# Patient Record
Sex: Male | Born: 1960
Health system: Southern US, Community
[De-identification: ages and names within clinical notes are randomized; demographics above are authoritative.]

## PROBLEM LIST (undated history)

## (undated) DIAGNOSIS — I255 Ischemic cardiomyopathy: Secondary | ICD-10-CM

## (undated) DIAGNOSIS — K651 Peritoneal abscess: Secondary | ICD-10-CM

## (undated) DIAGNOSIS — D649 Anemia, unspecified: Secondary | ICD-10-CM

## (undated) DIAGNOSIS — I5022 Chronic systolic (congestive) heart failure: Secondary | ICD-10-CM

## (undated) DIAGNOSIS — Z85038 Personal history of other malignant neoplasm of large intestine: Secondary | ICD-10-CM

## (undated) DIAGNOSIS — I10 Essential (primary) hypertension: Secondary | ICD-10-CM

## (undated) DIAGNOSIS — C186 Malignant neoplasm of descending colon: Secondary | ICD-10-CM

## (undated) DIAGNOSIS — K6389 Other specified diseases of intestine: Secondary | ICD-10-CM

## (undated) DIAGNOSIS — Z9889 Other specified postprocedural states: Secondary | ICD-10-CM

## (undated) DIAGNOSIS — F419 Anxiety disorder, unspecified: Secondary | ICD-10-CM

## (undated) DIAGNOSIS — I219 Acute myocardial infarction, unspecified: Secondary | ICD-10-CM

## (undated) DIAGNOSIS — D500 Iron deficiency anemia secondary to blood loss (chronic): Secondary | ICD-10-CM

## (undated) DIAGNOSIS — Z95810 Presence of automatic (implantable) cardiac defibrillator: Secondary | ICD-10-CM

## (undated) DIAGNOSIS — D696 Thrombocytopenia, unspecified: Secondary | ICD-10-CM

## (undated) DIAGNOSIS — K56600 Partial intestinal obstruction, unspecified as to cause: Secondary | ICD-10-CM

## (undated) DIAGNOSIS — Z598 Other problems related to housing and economic circumstances: Secondary | ICD-10-CM

## (undated) DIAGNOSIS — I2510 Atherosclerotic heart disease of native coronary artery without angina pectoris: Secondary | ICD-10-CM

## (undated) DIAGNOSIS — J449 Chronic obstructive pulmonary disease, unspecified: Secondary | ICD-10-CM

## (undated) DIAGNOSIS — N189 Chronic kidney disease, unspecified: Secondary | ICD-10-CM

## (undated) DIAGNOSIS — Z7901 Long term (current) use of anticoagulants: Secondary | ICD-10-CM

## (undated) DIAGNOSIS — C801 Malignant (primary) neoplasm, unspecified: Secondary | ICD-10-CM

## (undated) DIAGNOSIS — I252 Old myocardial infarction: Secondary | ICD-10-CM

## (undated) DIAGNOSIS — D120 Benign neoplasm of cecum: Secondary | ICD-10-CM

## (undated) DIAGNOSIS — Z720 Tobacco use: Secondary | ICD-10-CM

## (undated) DIAGNOSIS — F411 Generalized anxiety disorder: Secondary | ICD-10-CM

## (undated) DIAGNOSIS — E785 Hyperlipidemia, unspecified: Secondary | ICD-10-CM

## (undated) HISTORY — DX: Presence of automatic (implantable) cardiac defibrillator: Z95.810

## (undated) HISTORY — DX: Other specified postprocedural states: Z98.890

## (undated) HISTORY — DX: Peritoneal abscess: K65.1

## (undated) HISTORY — PX: COLONOSCOPY: SHX174

## (undated) HISTORY — DX: Chronic kidney disease, unspecified: N18.9

## (undated) HISTORY — DX: Partial intestinal obstruction, unspecified as to cause: K56.600

## (undated) HISTORY — DX: Essential (primary) hypertension: I10

## (undated) HISTORY — DX: Ischemic cardiomyopathy: I25.5

## (undated) HISTORY — DX: Chronic obstructive pulmonary disease, unspecified: J44.9

## (undated) HISTORY — DX: Other problems related to housing and economic circumstances: Z59.8

## (undated) HISTORY — DX: Benign neoplasm of cecum: D12.0

## (undated) HISTORY — DX: Generalized anxiety disorder: F41.1

## (undated) HISTORY — DX: Iron deficiency anemia secondary to blood loss (chronic): D50.0

## (undated) HISTORY — DX: Hyperlipidemia, unspecified: E78.5

## (undated) HISTORY — DX: Old myocardial infarction: I25.2

## (undated) HISTORY — DX: Personal history of other malignant neoplasm of large intestine: Z85.038

## (undated) HISTORY — DX: Tobacco use: Z72.0

## (undated) HISTORY — DX: Long term (current) use of anticoagulants: Z79.01

## (undated) HISTORY — DX: Malignant neoplasm of descending colon: C18.6

## (undated) HISTORY — PX: HAND SURGERY: SHX662

---

## 2005-06-15 ENCOUNTER — Encounter: Admission: RE | Admit: 2005-06-15 | Discharge: 2005-06-15 | Payer: Self-pay | Admitting: Neurology

## 2011-12-01 ENCOUNTER — Emergency Department (HOSPITAL_COMMUNITY)
Admission: EM | Admit: 2011-12-01 | Discharge: 2011-12-01 | Disposition: A | Discharge status: Home / Self Care | Payer: Self-pay | Attending: Emergency Medicine | Admitting: Emergency Medicine

## 2011-12-01 ENCOUNTER — Encounter (HOSPITAL_COMMUNITY): Payer: Self-pay | Admitting: *Deleted

## 2011-12-01 ENCOUNTER — Emergency Department (HOSPITAL_COMMUNITY): Payer: Self-pay

## 2011-12-01 DIAGNOSIS — Y93E5 Activity, floor mopping and cleaning: Secondary | ICD-10-CM | POA: Insufficient documentation

## 2011-12-01 DIAGNOSIS — S46909A Unspecified injury of unspecified muscle, fascia and tendon at shoulder and upper arm level, unspecified arm, initial encounter: Secondary | ICD-10-CM | POA: Insufficient documentation

## 2011-12-01 DIAGNOSIS — S0993XA Unspecified injury of face, initial encounter: Secondary | ICD-10-CM | POA: Insufficient documentation

## 2011-12-01 DIAGNOSIS — Y9289 Other specified places as the place of occurrence of the external cause: Secondary | ICD-10-CM | POA: Insufficient documentation

## 2011-12-01 DIAGNOSIS — F17200 Nicotine dependence, unspecified, uncomplicated: Secondary | ICD-10-CM | POA: Insufficient documentation

## 2011-12-01 DIAGNOSIS — W1789XA Other fall from one level to another, initial encounter: Secondary | ICD-10-CM | POA: Insufficient documentation

## 2011-12-01 DIAGNOSIS — IMO0002 Reserved for concepts with insufficient information to code with codable children: Secondary | ICD-10-CM

## 2011-12-01 DIAGNOSIS — S4980XA Other specified injuries of shoulder and upper arm, unspecified arm, initial encounter: Secondary | ICD-10-CM | POA: Insufficient documentation

## 2011-12-01 DIAGNOSIS — S4990XA Unspecified injury of shoulder and upper arm, unspecified arm, initial encounter: Secondary | ICD-10-CM

## 2011-12-01 DIAGNOSIS — S51009A Unspecified open wound of unspecified elbow, initial encounter: Secondary | ICD-10-CM | POA: Insufficient documentation

## 2011-12-01 MED ORDER — HYDROCODONE-ACETAMINOPHEN 5-325 MG PO TABS: 2.0000 | ORAL_TABLET | ORAL | Status: DC | PRN

## 2011-12-01 NOTE — ED Provider Notes (Signed)
History  Scribed for Nelia Shi, MD, patient was seen in room TR04C/TR04C. This chart was scribed by Candelaria Stagers. The patient's care started at 3:20 PM   CSN: [id number]  Arrival date & time 12/01/11  1446   None     Chief Complaint  Patient presents with  . Fall  . Laceration     Patient is a 51 y.o. male presenting with skin laceration. The history is provided by the patient. No language interpreter was used.  Laceration    Christopher [name] is a 51 y.o. male who presents to the Emergency Department complaining of lacerations to the left elbow after falling backwards off a roof while cleaning the gutters.  He is also experiencing pain to the left shoulder and left side of the neck.  External rotation at the shoulder makes the pain worse.  Pt has h/o neck pain.   History reviewed. No pertinent past medical history.  History reviewed. No pertinent past surgical history.  No family history on file.  History  Substance Use Topics  . Smoking status: Current Every Day Smoker  . Smokeless tobacco: Not on file  . Alcohol Use: Yes     occ      Review of Systems  All other systems reviewed and are negative.    Allergies  Celebrex  Home Medications   Current Outpatient Rx  Name Route Sig Dispense Refill  . ASPIRIN PO Oral Take 1 tablet by mouth as needed. For pain    . HYDROCODONE-ACETAMINOPHEN 5-325 MG PO TABS Oral Take 2 tablets by mouth every 4 (four) hours as needed for pain. 10 tablet 0    BP 145/98  Pulse 95  Temp 98.5 F (36.9 C) (Oral)  Resp 18  SpO2 96%  Physical Exam  Nursing note and vitals reviewed. Constitutional: He is oriented to person, place, and time. He appears well-developed and well-nourished. No distress.  HENT:  Head: Normocephalic and atraumatic.  Eyes: Pupils are equal, round, and reactive to light.  Neck: Normal range of motion. No spinous process tenderness and no muscular tenderness present.  Cardiovascular: Normal rate and  intact distal pulses.   Pulmonary/Chest: No respiratory distress.  Abdominal: Normal appearance. He exhibits no distension.  Musculoskeletal:       Left shoulder: He exhibits decreased range of motion and tenderness.       Thoracic back: He exhibits no tenderness and no bony tenderness.       Lumbar back: He exhibits no tenderness, no bony tenderness and no pain.       Arms:      Lacerations measuring 5cm and 1 cm  Neurological: He is alert and oriented to person, place, and time. No cranial nerve deficit.  Skin: Skin is warm and dry. No rash noted.  Psychiatric: He has a normal mood and affect. His behavior is normal.    ED Course  Procedures  DIAGNOSTIC STUDIES:  COORDINATION OF CARE:   14:55 Ordered: DG Shoulder Left  3:57 PM LACERATION REPAIR Performed by: Nelia Shi, MD Consent: Verbal consent obtained. Risks and benefits: risks, benefits and alternatives were discussed Patient identity confirmed: provided demographic data Time out performed prior to procedure Prepped and Draped in normal sterile fashion Wound explored Laceration Location: L elbow Laceration Length: 6cm No Foreign Bodies seen or palpated Anesthesia: local infiltration Local anesthetic: lidocaine 2% w epinephrine Anesthetic total: 10 ml Irrigation method: syringe Amount of cleaning: standard Skin closure:  Number of sutures or staples:  8 Technique: staples Patient tolerance: Patient tolerated the procedure well with no immediate complications.  Labs Reviewed - No data to display Dg Shoulder Left  12/01/2011  *RADIOLOGY REPORT*  Clinical Data: Larey Seat.  Left shoulder pain.  LEFT SHOULDER - 2+ VIEW  Comparison: None  Findings: The joint spaces are maintained.  No acute fracture or abnormal soft tissue calcifications.  The left lung is clear and the upper left ribs are intact.  IMPRESSION: No fracture or dislocation.   Original Report Authenticated By: Rudie Meyer, M.D.      1. Laceration   2.  Shoulder injury       MDM  I personally performed the services described in this documentation, which was scribed in my presence. The recorded information has been reviewed and considered.       Nelia Shi, MD 12/01/11 2256

## 2011-12-01 NOTE — ED Notes (Signed)
Pt arrived via [facility] and ambulatory after falling off a roof about 10 feet up and landed on his back onto a wood deck.  Pt is shakey.  Pt last etoh was last nite, but denies drinking everyday.  No loc and pt c/o left shoulder pain and left elbow abrasion and with 4 in and 2 inc lac to distal elbow.  Bleeding controlled

## 2011-12-08 ENCOUNTER — Emergency Department (HOSPITAL_COMMUNITY)
Admission: EM | Admit: 2011-12-08 | Discharge: 2011-12-08 | Disposition: A | Discharge status: Home / Self Care | Payer: Self-pay | Attending: Emergency Medicine | Admitting: Emergency Medicine

## 2011-12-08 ENCOUNTER — Encounter (HOSPITAL_COMMUNITY): Payer: Self-pay | Admitting: *Deleted

## 2011-12-08 DIAGNOSIS — Z4802 Encounter for removal of sutures: Secondary | ICD-10-CM | POA: Insufficient documentation

## 2011-12-08 DIAGNOSIS — F17200 Nicotine dependence, unspecified, uncomplicated: Secondary | ICD-10-CM | POA: Insufficient documentation

## 2011-12-08 MED ORDER — FLUORESCEIN SODIUM 1 MG OP STRP
ORAL_STRIP | OPHTHALMIC | Status: AC
  Filled 2011-12-08: qty 1

## 2011-12-08 NOTE — ED Provider Notes (Signed)
History    This chart was scribed for Christopher Sprout, MD, MD by Smitty Pluck, ED Scribe. The patient was seen in room TR11C and the patient's care was started at 11:28AM.   CSN: [id number]  Arrival date & time 12/08/11  1050      Chief Complaint  Patient presents with  . Suture / Staple Removal    (Consider location/radiation/quality/duration/timing/severity/associated sxs/prior treatment) Patient is a 51 y.o. male presenting with suture removal. The history is provided by the patient. No language interpreter was used.  Suture / Staple Removal    Christopher [name] is a 51 y.o. male who presents to the Emergency Department due to staple removal from left forearm. Staples were placed 1 week ago. Pt reports that he fell off of roof causing laceration. He denies any complications with laceration. Pt denies any other pain currently.      History reviewed. No pertinent past medical history.  History reviewed. No pertinent past surgical history.  No family history on file.  History  Substance Use Topics  . Smoking status: Current Every Day Smoker  . Smokeless tobacco: Not on file  . Alcohol Use: Yes     Comment: occ      Review of Systems  Constitutional: Negative for fever and chills.  Respiratory: Negative for shortness of breath.   Gastrointestinal: Negative for nausea and vomiting.  Neurological: Negative for weakness.  All other systems reviewed and are negative.    Allergies  Celebrex  Home Medications   Current Outpatient Rx  Name  Route  Sig  Dispense  Refill  . ASPIRIN EC 325 MG PO TBEC   Oral   Take 325 mg by mouth daily.           BP 140/106  Pulse 101  Temp 97.9 F (36.6 C) (Oral)  Resp 18  Ht 5\' 9"  (1.753 m)  Wt 150 lb (68.04 kg)  BMI 22.15 kg/m2  SpO2 96%  Physical Exam  Nursing note and vitals reviewed. Constitutional: He is oriented to person, place, and time. He appears well-developed and well-nourished. No distress.  HENT:    Head: Normocephalic and atraumatic.  Eyes: EOM are normal.  Neck: Neck supple. No tracheal deviation present.  Pulmonary/Chest: Effort normal. No respiratory distress.  Musculoskeletal: Normal range of motion.  Neurological: He is alert and oriented to person, place, and time.  Skin: Skin is warm and dry.       9 staples were removed Well healed wound Mild redness No drainage  Psychiatric: He has a normal mood and affect. His behavior is normal.    ED Course  Procedures (including critical care time) DIAGNOSTIC STUDIES: Oxygen Saturation is 96% on room air, normal by my interpretation.    COORDINATION OF CARE: 11:41 AM Discussed ED treatment with pt     Labs Reviewed - No data to display No results found.   1. Encounter for staple removal       MDM   Patient here for staple removal. Wound is healing appropriately. Staples removed Steri-Strips placed and patient discharged home.      I personally performed the services described in this documentation, which was scribed in my presence.  The recorded information has been reviewed and considered.    Christopher Sprout, MD 12/08/11 1145

## 2011-12-08 NOTE — ED Notes (Signed)
Patient is here for suture removal,  Placed last week to the left elbow.  Patient denies any obvious s/sx of infection

## 2013-04-29 DIAGNOSIS — I219 Acute myocardial infarction, unspecified: Secondary | ICD-10-CM

## 2013-04-29 HISTORY — DX: Acute myocardial infarction, unspecified: I21.9

## 2013-05-29 HISTORY — PX: CARDIAC CATHETERIZATION: SHX172

## 2013-05-29 HISTORY — PX: CORONARY ANGIOPLASTY: SHX604

## 2013-06-20 ENCOUNTER — Emergency Department (HOSPITAL_COMMUNITY): Payer: No Typology Code available for payment source

## 2013-06-20 ENCOUNTER — Encounter (HOSPITAL_COMMUNITY)
Admission: EM | Disposition: A | Discharge status: Home / Self Care | Payer: No Typology Code available for payment source | Source: Home / Self Care | Attending: Cardiovascular Disease

## 2013-06-20 ENCOUNTER — Inpatient Hospital Stay (HOSPITAL_COMMUNITY)
Admission: EM | Admit: 2013-06-20 | Discharge: 2013-06-25 | DRG: 237 | Disposition: A | Discharge status: Home / Self Care | Payer: No Typology Code available for payment source | Attending: Cardiovascular Disease | Admitting: Cardiovascular Disease

## 2013-06-20 ENCOUNTER — Encounter (HOSPITAL_COMMUNITY): Payer: Self-pay | Admitting: Emergency Medicine

## 2013-06-20 DIAGNOSIS — F17200 Nicotine dependence, unspecified, uncomplicated: Secondary | ICD-10-CM | POA: Diagnosis present

## 2013-06-20 DIAGNOSIS — I5022 Chronic systolic (congestive) heart failure: Secondary | ICD-10-CM | POA: Diagnosis not present

## 2013-06-20 DIAGNOSIS — I2102 ST elevation (STEMI) myocardial infarction involving left anterior descending coronary artery: Secondary | ICD-10-CM | POA: Diagnosis present

## 2013-06-20 DIAGNOSIS — I2589 Other forms of chronic ischemic heart disease: Secondary | ICD-10-CM | POA: Diagnosis present

## 2013-06-20 DIAGNOSIS — I2582 Chronic total occlusion of coronary artery: Secondary | ICD-10-CM | POA: Diagnosis present

## 2013-06-20 DIAGNOSIS — J449 Chronic obstructive pulmonary disease, unspecified: Secondary | ICD-10-CM | POA: Diagnosis present

## 2013-06-20 DIAGNOSIS — I252 Old myocardial infarction: Secondary | ICD-10-CM

## 2013-06-20 DIAGNOSIS — I2510 Atherosclerotic heart disease of native coronary artery without angina pectoris: Secondary | ICD-10-CM | POA: Diagnosis present

## 2013-06-20 DIAGNOSIS — I5023 Acute on chronic systolic (congestive) heart failure: Secondary | ICD-10-CM | POA: Diagnosis present

## 2013-06-20 DIAGNOSIS — I214 Non-ST elevation (NSTEMI) myocardial infarction: Secondary | ICD-10-CM

## 2013-06-20 DIAGNOSIS — I2109 ST elevation (STEMI) myocardial infarction involving other coronary artery of anterior wall: Principal | ICD-10-CM | POA: Diagnosis present

## 2013-06-20 DIAGNOSIS — F17210 Nicotine dependence, cigarettes, uncomplicated: Secondary | ICD-10-CM | POA: Diagnosis present

## 2013-06-20 DIAGNOSIS — E785 Hyperlipidemia, unspecified: Secondary | ICD-10-CM | POA: Diagnosis present

## 2013-06-20 DIAGNOSIS — I959 Hypotension, unspecified: Secondary | ICD-10-CM | POA: Diagnosis present

## 2013-06-20 DIAGNOSIS — Z720 Tobacco use: Secondary | ICD-10-CM | POA: Diagnosis present

## 2013-06-20 DIAGNOSIS — Z955 Presence of coronary angioplasty implant and graft: Secondary | ICD-10-CM

## 2013-06-20 DIAGNOSIS — Z886 Allergy status to analgesic agent status: Secondary | ICD-10-CM

## 2013-06-20 DIAGNOSIS — I509 Heart failure, unspecified: Secondary | ICD-10-CM | POA: Diagnosis present

## 2013-06-20 DIAGNOSIS — J4489 Other specified chronic obstructive pulmonary disease: Secondary | ICD-10-CM | POA: Diagnosis present

## 2013-06-20 HISTORY — PX: INTRA-AORTIC BALLOON PUMP INSERTION: SHX5475

## 2013-06-20 HISTORY — PX: PERCUTANEOUS CORONARY STENT INTERVENTION (PCI-S): SHX5485

## 2013-06-20 HISTORY — PX: LEFT HEART CATHETERIZATION WITH CORONARY ANGIOGRAM: SHX5451

## 2013-06-20 HISTORY — DX: Old myocardial infarction: I25.2

## 2013-06-20 LAB — BASIC METABOLIC PANEL
BUN: 13 mg/dL (ref 6–23)
CALCIUM: 10.3 mg/dL (ref 8.4–10.5)
CHLORIDE: 99 meq/L (ref 96–112)
CO2: 23 meq/L (ref 19–32)
Creatinine, Ser: 1.13 mg/dL (ref 0.50–1.35)
GFR calc Af Amer: 86 mL/min — ABNORMAL LOW (ref >90)
GFR, EST NON AFRICAN AMERICAN: 74 mL/min — AB (ref >90)
Glucose, Bld: 121 mg/dL — ABNORMAL HIGH (ref 70–99)
POTASSIUM: 4.5 meq/L (ref 3.7–5.3)
Sodium: 138 mEq/L (ref 137–147)

## 2013-06-20 LAB — CBC
HCT: 45.4 % (ref 39.0–52.0)
HEMOGLOBIN: 15.9 g/dL (ref 13.0–17.0)
MCH: 32.3 pg (ref 26.0–34.0)
MCHC: 35 g/dL (ref 30.0–36.0)
MCV: 92.1 fL (ref 78.0–100.0)
Platelets: 152 10*3/uL (ref 150–400)
RBC: 4.93 MIL/uL (ref 4.22–5.81)
RDW: 14.1 % (ref 11.5–15.5)
WBC: 17 10*3/uL — AB (ref 4.0–10.5)

## 2013-06-20 LAB — MRSA PCR SCREENING: MRSA BY PCR: NEGATIVE

## 2013-06-20 LAB — CK TOTAL AND CKMB (NOT AT ARMC)
CK, MB: >300 ng/mL (ref 0.3–4.0)
Total CK: 6036 U/L — ABNORMAL HIGH (ref 7–232)

## 2013-06-20 LAB — I-STAT TROPONIN, ED: TROPONIN I, POC: 20.24 ng/mL — AB (ref 0.00–0.08)

## 2013-06-20 SURGERY — LEFT HEART CATHETERIZATION WITH CORONARY ANGIOGRAM
Anesthesia: LOCAL

## 2013-06-20 MED ORDER — TICAGRELOR 90 MG PO TABS
ORAL_TABLET | ORAL | Status: AC
  Filled 2013-06-20: qty 2

## 2013-06-20 MED ORDER — ASPIRIN 81 MG PO CHEW
81.0000 mg | CHEWABLE_TABLET | Freq: Every day | ORAL | Status: DC
  Administered 2013-06-21 – 2013-06-25 (×5): 81 mg via ORAL
  Filled 2013-06-20 (×5): qty 1

## 2013-06-20 MED ORDER — SODIUM CHLORIDE 0.9 % IV SOLN: INTRAVENOUS | Status: AC

## 2013-06-20 MED ORDER — LIDOCAINE HCL (PF) 1 % IJ SOLN
INTRAMUSCULAR | Status: AC
  Filled 2013-06-20: qty 30

## 2013-06-20 MED ORDER — MORPHINE SULFATE 4 MG/ML IJ SOLN
4.0000 mg | INTRAMUSCULAR | Status: DC | PRN
  Administered 2013-06-20: 4 mg via INTRAVENOUS
  Filled 2013-06-20: qty 1

## 2013-06-20 MED ORDER — HEPARIN BOLUS VIA INFUSION
4000.0000 [IU] | Freq: Once | INTRAVENOUS | Status: DC
  Filled 2013-06-20: qty 4000

## 2013-06-20 MED ORDER — CARVEDILOL 3.125 MG PO TABS
3.1250 mg | ORAL_TABLET | Freq: Two times a day (BID) | ORAL | Status: DC
  Administered 2013-06-21 (×2): 3.125 mg via ORAL
  Filled 2013-06-20 (×10): qty 1

## 2013-06-20 MED ORDER — HEPARIN SODIUM (PORCINE) 5000 UNIT/ML IJ SOLN
4000.0000 [IU] | INTRAMUSCULAR | Status: AC
  Administered 2013-06-20: 4000 [IU] via SUBCUTANEOUS
  Filled 2013-06-20: qty 0.8

## 2013-06-20 MED ORDER — LISINOPRIL 2.5 MG PO TABS
2.5000 mg | ORAL_TABLET | Freq: Every day | ORAL | Status: DC
Start: 2013-06-21 — End: 2013-06-24
  Administered 2013-06-21 – 2013-06-23 (×2): 2.5 mg via ORAL
  Filled 2013-06-20 (×4): qty 1

## 2013-06-20 MED ORDER — FENTANYL CITRATE 0.05 MG/ML IJ SOLN
INTRAMUSCULAR | Status: AC
  Filled 2013-06-20: qty 2

## 2013-06-20 MED ORDER — TICAGRELOR 90 MG PO TABS
90.0000 mg | ORAL_TABLET | Freq: Two times a day (BID) | ORAL | Status: DC
  Administered 2013-06-20 – 2013-06-25 (×10): 90 mg via ORAL
  Filled 2013-06-20 (×11): qty 1

## 2013-06-20 MED ORDER — ONDANSETRON HCL 4 MG/2ML IJ SOLN
4.0000 mg | Freq: Once | INTRAMUSCULAR | Status: AC
  Administered 2013-06-20: 4 mg via INTRAVENOUS
  Filled 2013-06-20: qty 2

## 2013-06-20 MED ORDER — HEPARIN (PORCINE) IN NACL 2-0.9 UNIT/ML-% IJ SOLN
INTRAMUSCULAR | Status: AC
  Filled 2013-06-20: qty 1500

## 2013-06-20 MED ORDER — BIVALIRUDIN 250 MG IV SOLR
INTRAVENOUS | Status: AC
Start: 2013-06-20 — End: 2013-06-20
  Filled 2013-06-20: qty 250

## 2013-06-20 MED ORDER — NITROGLYCERIN 0.2 MG/ML ON CALL CATH LAB
INTRAVENOUS | Status: AC
  Filled 2013-06-20: qty 1

## 2013-06-20 MED ORDER — HEPARIN (PORCINE) IN NACL 100-0.45 UNIT/ML-% IJ SOLN
900.0000 [IU]/h | INTRAMUSCULAR | Status: DC
  Administered 2013-06-20: 750 [IU]/h via INTRAVENOUS
  Filled 2013-06-20: qty 250

## 2013-06-20 MED ORDER — MIDAZOLAM HCL 2 MG/2ML IJ SOLN
INTRAMUSCULAR | Status: AC
  Filled 2013-06-20: qty 2

## 2013-06-20 MED ORDER — NITROGLYCERIN IN D5W 200-5 MCG/ML-% IV SOLN
5.0000 ug/min | INTRAVENOUS | Status: DC
  Administered 2013-06-20: 5 ug/min via INTRAVENOUS
  Filled 2013-06-20: qty 250

## 2013-06-20 MED ORDER — HEPARIN SODIUM (PORCINE) 1000 UNIT/ML IJ SOLN
INTRAMUSCULAR | Status: AC
  Filled 2013-06-20: qty 1

## 2013-06-20 MED ORDER — SODIUM CHLORIDE 0.9 % IV BOLUS (SEPSIS)
1000.0000 mL | Freq: Once | INTRAVENOUS | Status: AC
  Administered 2013-06-20: 1000 mL via INTRAVENOUS

## 2013-06-20 MED ORDER — VERAPAMIL HCL 2.5 MG/ML IV SOLN
INTRAVENOUS | Status: AC
  Filled 2013-06-20: qty 2

## 2013-06-20 NOTE — ED Notes (Signed)
md at bedside

## 2013-06-20 NOTE — ED Provider Notes (Signed)
CSN: [id number]     Arrival date & time 06/20/13  1327 History   First MD Initiated Contact with Patient 06/20/13 1358     Chief Complaint  Patient presents with  . Chest Pain     (Consider location/radiation/quality/duration/timing/severity/associated sxs/prior Treatment) HPI  Christopher [name] is a 53 y.o. male who presents for evaluation of left upper chest pain, that started at 4 PM yesterday. The pain has been constant. It is currently 9/10. This is the worst that the pain has been. There is no associated diaphoresis, nausea, vomiting, cough, or shortness of breath. He saw his chiropractor, yesterday morning, for a adjustment of his upper back. His upper back and neck do not hurt, now. He recalls similar chest pain, when he had a fall, 2 years ago. He has been receiving chiropractic treatment for this fall. He has not had any known cardiac or pulmonary disease. He does not take any medications, regularly.    Past Medical History  Diagnosis Date  . Fall    No past surgical history on file. No family history on file. History  Substance Use Topics  . Smoking status: Current Every Day Smoker  . Smokeless tobacco: Not on file  . Alcohol Use: Yes     Comment: occ    Review of Systems  All other systems reviewed and are negative.     Allergies  Celebrex  Home Medications   Prior to Admission medications   Medication Sig Start Date End Date Taking? Authorizing Provider  aspirin EC 325 MG tablet Take 325 mg by mouth every morning.    Yes Historical Provider, MD   BP 125/93  Pulse 113  Temp(Src) 97.6 F (36.4 C) (Oral)  Resp 16  SpO2 98% Physical Exam  Nursing note and vitals reviewed. Constitutional: He is oriented to person, place, and time. He appears well-developed and well-nourished.  HENT:  Head: Normocephalic and atraumatic.  Right Ear: External ear normal.  Left Ear: External ear normal.  Eyes: Conjunctivae and EOM are normal. Pupils are equal, round, and  reactive to light.  Neck: Normal range of motion and phonation normal. Neck supple.  Cardiovascular: Normal rate, regular rhythm, normal heart sounds and intact distal pulses.   Pulmonary/Chest: Effort normal and breath sounds normal. No respiratory distress. He exhibits no tenderness and no bony tenderness.  Abdominal: Soft. There is no tenderness.  Musculoskeletal: Normal range of motion.  No tenderness over the cervical, thoracic spines; or paravertebral musculature tenderness.  Neurological: He is alert and oriented to person, place, and time. No cranial nerve deficit or sensory deficit. He exhibits normal muscle tone. Coordination normal.  Skin: Skin is warm, dry and intact.  Psychiatric: He has a normal mood and affect. His behavior is normal. Judgment and thought content normal.    ED Course  Procedures (including critical care time)  Medications  morphine 4 MG/ML injection 4 mg (4 mg Intravenous Given 06/20/13 1425)  nitroGLYCERIN 0.2 mg/mL in dextrose 5 % infusion (5 mcg/min Intravenous New Bag/Given 06/20/13 1429)  ondansetron (ZOFRAN) injection 4 mg (4 mg Intravenous Given 06/20/13 1425)  heparin injection 4,000 Units (4,000 Units Subcutaneous Given 06/20/13 1423)    Patient Vitals for the past 24 hrs:  BP Temp Temp src Pulse Resp SpO2  06/20/13 1415 125/93 mmHg - - 113 - 98 %  06/20/13 1400 124/93 mmHg - - 112 16 97 %  06/20/13 1355 141/103 mmHg - - 108 16 99 %  06/20/13 1350 - - -  118 21 95 %  06/20/13 1339 132/102 mmHg 97.6 F (36.4 C) Oral 112 14 98 %    2: 18 PM Reevaluation with update and discussion. After initial assessment and treatment, an updated evaluation reveals no change in status. Richarda Blade    14:20- I discussed the case, and reviewed the EKG with Dr. Wynonia Lawman. He, states that this is an anterior MI, of undetermined onset, he would like the patient to be a code STEMI. Pt. has had continuous CP since 4 PM yesterday.  STEMI is activated.  CRITICAL  CARE Performed by: Richarda Blade Total critical care time: 50 minutes Critical care time was exclusive of separately billable procedures and treating other patients. Critical care was necessary to treat or prevent imminent or life-threatening deterioration. Critical care was time spent personally by me on the following activities: development of treatment plan with patient and/or surrogate as well as nursing, discussions with consultants, evaluation of patient's response to treatment, examination of patient, obtaining history from patient or surrogate, ordering and performing treatments and interventions, ordering and review of laboratory studies, ordering and review of radiographic studies, pulse oximetry and re-evaluation of patient's condition.  Labs Review Labs Reviewed  CBC - Abnormal; Notable for the following:    WBC 17.0 (*)    All other components within normal limits  BASIC METABOLIC PANEL - Abnormal; Notable for the following:    Glucose, Bld 121 (*)    GFR calc non Af Amer 74 (*)    GFR calc Af Amer 86 (*)    All other components within normal limits  I-STAT TROPOININ, ED - Abnormal; Notable for the following:    Troponin i, poc 20.24 (*)    All other components within normal limits    Imaging Review Dg Chest Port 1 View  06/20/2013   CLINICAL DATA:  53 year old male with chest pain and shortness of breath  EXAM: PORTABLE CHEST - 1 VIEW  COMPARISON:  None.  FINDINGS: The cardiomediastinal silhouette is unremarkable.  Interstitial prominence is present.  There is no evidence of focal airspace disease, suspicious pulmonary nodule/mass, pleural effusion, or pneumothorax. No acute bony abnormalities are identified.  IMPRESSION: Mild interstitial opacities of uncertain chronicity but more likely chronic. Mild interstitial infection/edema is considered less likely.   Electronically Signed   By: Hassan Rowan M.D.   On: 06/20/2013 14:33     EKG Interpretation   Date/Time:  Saturday  Jun 20 2013 13:44:23 EDT Ventricular Rate:  108 PR Interval:  174 QRS Duration: 90 QT Interval:  331 QTC Calculation: 444 R Axis:   92 Text Interpretation:  Sinus tachycardia Probable left atrial enlargement  Inferolateral infarct, age indeterminate Probable anteroseptal infarct,  recent No old tracing to compare Confirmed by [facility]  MD, [place] [phone])  on 06/20/2013 2:33:40 PM      MDM   Final diagnoses:  NSTEMI (non-ST elevated myocardial infarction)     Chest pain with elevated troponin, and abnormal EKG. STEMI was called d/t persistent CP. The initial EKG did not have criteria for STEMI activation (viewed at 1347).  Pt. Being transferred to Cath Lab at [facility], MD 06/22/13 2025

## 2013-06-20 NOTE — ED Notes (Signed)
He c/o central area chest pain; although he originally phoned EMS for back pain.  He further tells me he had a fall of ~12-14 feet in Nov. Of 2013 for which he has seeked occasional treatments at a chiropractor.  He states he has had this chest pain that radiates through to upper back ever since a chiropractic treatment two days ago. He is in no distress, although he is somewhat hyperactive in his behavior.

## 2013-06-20 NOTE — ED Notes (Signed)
md made aware of lab results

## 2013-06-20 NOTE — ED Notes (Signed)
[facility] alerted, pt being transported to cath lab via ems

## 2013-06-20 NOTE — ED Notes (Signed)
[facility] unavailable, ems called

## 2013-06-20 NOTE — Progress Notes (Signed)
ANTICOAGULATION CONSULT NOTE - Initial Consult  Pharmacy Consult for Heparin Indication: IABP  Allergies  Allergen Reactions  . Celebrex [Celecoxib] Other (See Comments)    Caused his back to burn    Patient Measurements: Height: 5\' 8"  (172.7 cm) Weight: 147 lb 14.9 oz (67.1 kg) IBW/kg (Calculated) : 68.4 Heparin Dosing Weight: 67.1 kg  Vital Signs: Temp: 98.5 F (36.9 C) (05/23 1800) Temp src: Oral (05/23 1800) BP: 132/98 mmHg (05/23 1452) Pulse Rate: 113 (05/23 1510)  Labs:  Recent Labs  06/20/13 1350  HGB 15.9  HCT 45.4  PLT 152  CREATININE 1.13    Estimated Creatinine Clearance: 74.2 ml/min (by C-G formula based on Cr of 1.13).   Medical History: History reviewed. No pertinent past medical history.  Medications:  Prescriptions prior to admission  Medication Sig Dispense Refill  . aspirin EC 325 MG tablet Take 325 mg by mouth every morning.         Assessment: CP 53 y/o M smoker with no known PMH presents with ongoing chest pain. STEMI.  Cath 5/23: PTCA +stento to mid-LAD. Chronic total occlusion of the right coronary and moderately severe diffuse circumflex disease. EF 15-20%. IABP x 24-48h.   Goal of Therapy:  Heparin level 0.2-0.5 Monitor platelets by anticoagulation protocol: Yes   Plan:  Start IV heparin with no bolus at 750 units/hr Will check heparin level in 6 hrs and daily.  Rojean Ige S. Alford Highland, PharmD, [facility] Clinical Staff Pharmacist Pager [phone]  Wayland Salinas 06/20/2013,6:48 PM

## 2013-06-20 NOTE — ED Notes (Signed)
Pt c/o constant CP x2 days. Pt reports that it feels the same as when he fell off a roof several years ago. Pt denies SOB, nausea, emesis. Pt is A&O and in NAD. Pt adds that pain started after a chiropractic adjustment.

## 2013-06-20 NOTE — ED Notes (Signed)
Cardiology at bedside in trauma C

## 2013-06-20 NOTE — ED Provider Notes (Signed)
Patient seen on arrival. Code STEMI activated from [facility]. Dr.Tilley here awaiting patient upon his arrival. He continues to complain of chest pain. EKG shows evolving anterior changes. Patient is taken directly to Cath Lab after history and physical obtained. Patient given aspirin 324 by EMS. Given heparin bolus at [facility]. On nitro drip now. Hemodynamically stable. Awake and alert.  Tanna Furry, MD 06/20/13 1459

## 2013-06-20 NOTE — CV Procedure (Signed)
Left Heart Catheterization with Coronary Angiography and PCI Report  Christopher [name]  53 y.o.  male [birth year]  Procedure Date: 06/20/2013 Referring Physician: Grace Bushy. Wynonia Lawman, M.D. Primary Cardiologist: Grace Bushy. Wynonia Lawman, M.D.  INDICATIONS: Ongoing chest pain with acute anterior ST elevation compatible with late presenting as elevation MI  PROCEDURE: 1. Left heart catheterization; 2 para coronary angiography; 3. Left ventriculography; 4. PCI with DES LAD; 5. Intra-aortic balloon pump  CONSENT:  The risks, benefits, and details of the procedure were explained in detail to the patient. Risks including death, stroke, heart attack, kidney injury, allergy, limb ischemia, bleeding and radiation injury were discussed.  The patient verbalized understanding and wanted to proceed.  Informed written consent was obtained.  PROCEDURE TECHNIQUE:  After Xylocaine anesthesia a 5 French Slender sheath was placed in the right radial artery with an angiocath and the modified Seldinger technique.  Coronary angiography was done using a 5 F JR 4 and JL 3.5 cm diagnostic catheters.  Left ventriculography was done using the JR 4 catheter and hand injection. Initial angiography demonstrated a complex chronic coronary disease with a highly tortuous right coronary totally occluded after the first acute marginal branch in the mid vessel. Highly diseased left coronary system with proximal to mid LAD total occlusion after a small first diagonal. Collaterals were noted left to right. Circumflex contained ectatic moderately severe atherosclerosis. 4000 units of heparin was administered in the emergency room prior to arrival in the cath lab.  With ongoing chest pain we decided to proceed with angioplasty of the acute vessel, the LAD. The angiographic marker of acute occlusion was contrast staining thrombus at the site of total occlusion. Heavy calcification was noted in this region. Left ventriculography demonstrated a  markedly dilated left ventricular cavity, inferior wall and a large region of mid to anterior wall akinesis the ejection fraction was estimated to be 15-20%.  Bivalirudin bolus followed by infusion to a therapeutic ACT was accomplished. Brilinta 180 mg loading dose was given orally.  An XB LAD 3.5 cm guide catheter was then used from the right radial to obtain guiding shots. We used a Pro-water 0.014 guidewire in the diagonal and a BMW 0.014 wire was used to attempt LAD cannulization. We had difficulty crossing the LAD lesion. After persistence we were eventually successful, however difficulty with wiring prolonged reperfusion time. We then performed angioplasty over the BMW wire with a 2.0 x 12 mm balloon in multiple spots throughout the proximal to distal LAD. We then use a 2.5 x 15 mm balloon to again perform multiple overlapping balloon inflations throughout the proximal to distal LAD. We then positioned and deployed a 28 x 3.0 mm Xience Alpine stent to 10 atmospheres. Postdilatation was performed with a 15 mm long by 3.5 millimeter Hutchinson Trek to 12 atmospheres x2 inflations. A distal stent margin region of disruption was noted post stent deployment. An additional 3.0 x 8 mm Xience Alpine DES was deployed overlapping with the initial stent to 12 atmospheres. This adequately covered the entire treatment area and less than 0% stenosis was noted. Multiple aliquots of intracoronary nitroglycerin were administered. The distal vessel was somewhat small and the appearance of chronic occlusion and vessel atresia.  Because of continued chest discomfort after successful PCI of the LAD, we placed an intra-aortic balloon pump in the right femoral using the modified Seldinger technique and fluoroscopic guidance. The indication was acute on chronic LV heart failure with the hope that reduction in LV wall tension  and improvement and diastolic myocardial perfusion with improved symptoms and help manage incipient heart  failure.  Post procedure the right radial sheath was removed and a wrist band was applied with good hemostasis using 11 cc of water.   CONTRAST:  Total of 296 cc.  COMPLICATIONS:  None   HEMODYNAMICS:  Aortic pressure 110/87 mmHg; LV pressure 111/27 mmHg; LVEDP 32 mmHg  ANGIOGRAPHIC DATA:   The left main coronary artery is widely patent but short.  The left anterior descending artery is totally occluded after the first septal perforator and diagonal.  The left circumflex artery is patent with moderate disease noted including ectasia in the mid vessel. There is 50% stenosis of the second obtuse marginal which is the largest of 3..  The right coronary artery is totally occluded in the mid vessel after a severe tortuosity noted in the proximal segment. The first acute marginal branch supplies collaterals to the PDA. The PDA and distal left ventricular branches are also supply by left to right collaterals from the proximal LAD and the circumflex.Marland Kitchen   PCI RESULTS: The LAD is 100% occluded in the proximal midsegment and reduced to 0% after angioplasty and stenting of a chronically diseased mid segment finishing with 32 mm of overlapping DE stent postdilated to 3.5 mm in diameter. TIMI grade 3 flow was noted to  LEFT VENTRICULOGRAM:  Left ventricular angiogram was done in the 30 RAO projection and revealed dilated LV cavity with inferior wall akinesis apical akinesis/dyskinesis and mid and distal anterior wall akinesis. Estimated ejection fraction is 15-20%.   IMPRESSIONS:  1. Acute ST elevation microinfarction, anterior. 2. Successful PTCA and stenting of the proximal to mid LAD with DES from 100% to 0% final result. Time to reperfusion was prolonged due to difficulty crossing the stenosis in the LAD which had obvious severe chronic calcified disease. 3. Chronic total occlusion of the right coronary and moderately severe diffuse circumflex disease. 4. Severe left ventricular dysfunction as  described above with LVEF 15-20% 5. Continued chest pain post procedure with a respiratory/pleuritic component potentially compatible with post MI pericarditis playing a role in the patient's ongoing chest pain over the last 24 hours 6. Successful implantation of intra-aortic balloon pump to unload the LV, and improved diastolic myocardial perfusion  RECOMMENDATION:   1. Aspirin and Brilinta 2. IV heparin 3. Low-dose ACE and beta blocker therapy 4. Will need heart failure team evaluation and management 5. Intra-aortic balloon counterpulsation x 24-48 hours before discontinuation

## 2013-06-20 NOTE — ED Notes (Signed)
Cath team sts ready and pt transported upstairs.

## 2013-06-20 NOTE — ED Notes (Signed)
Troponin given to Dr. Eulis Foster

## 2013-06-20 NOTE — H&P (Addendum)
History and Physical   Admit date: 06/20/2013 Name:  Christopher [name] Medical record number: [id number] DOB/Age:  1960/02/14  53 y.o. male  Referring Physician:   [facility]  Primary Physician:   None  Chief complaint/reason for admission: Chest pain  HPI:  This 53 year old male presented to the emergency room with severe chest pain. The patient has a history of smoking but has unremarkable past history. He does manual labor usually doing heavy lifting. He was in his usual state of health until April when he began to have exertional left-sided chest discomfort when pushing heavy objects. He sought the attention of a chiropractor and has been receiving treatment since then. He was able to work on Thursday night. Yesterday afternoon he had the onset of left-sided chest pain and it persisted and was severe overnight he was unable to sleep. He was brought by EMS to the emergency room at [facility] long today and his troponin was greater than 20. An EKG showed an anterolateral infarct of undetermined age with residual ST elevation. He continues to complain of chest pain around 7/10 now but was earlier 9/10. The EMS noted some fluctuating ST elevations.  He really doesn't have any prior history of hypertension and does not have a regular doctor. He is no history of hyperlipidemia. As noted above he does smoke.   History reviewed. No pertinent past medical history.    No past surgical history on file..  Allergies: is allergic to celebrex.   Medications: Prior to Admission medications   Medication Sig Start Date End Date Taking? Authorizing Provider  aspirin EC 325 MG tablet Take 325 mg by mouth every morning.    Yes Historical Provider, MD    Family History:  Family Status  Relation Status Death Age  . Father Deceased 5's  . Mother Deceased 32's  . Brother Alive   . Brother Alive     Social History:   reports that he has been smoking Cigarettes.  He has a 60 pack-year  smoking history. He has never used smokeless tobacco. He reports that he drinks alcohol.   History   Social History Narrative  He reports that a male roommate died of infarction recently.   Review of Systems:  Other than as noted above, the remainder of the review of systems is normal  Physical Exam: BP 132/98  Pulse 119  Temp(Src) 98.3 F (36.8 C) (Oral)  Resp 21  SpO2 97% General appearance: Anxious appearing white male complaining of chest pain Head: Normocephalic, without obvious abnormality, atraumatic Eyes: conjunctivae/corneas clear. PERRL, EOM's intact. Fundi not examined Neck: no adenopathy, no carotid bruit, no JVD and supple, symmetrical, trachea midline Lungs: clear to auscultation bilaterally Heart: regular rate and rhythm, S1, S2 normal, no murmur, click, rub or gallop Abdomen: soft, non-tender; bowel sounds normal; no masses,  no organomegaly Rectal: deferred Extremities: extremities normal, atraumatic, no cyanosis or edema Pulses: 2+ and symmetric Skin: Skin color, texture, turgor normal. No rashes or lesions Neurologic: Grossly normal   Labs: CBC  Recent Labs  06/20/13 1350  WBC 17.0*  RBC 4.93  HGB 15.9  HCT 45.4  PLT 152  MCV 92.1  MCH 32.3  MCHC 35.0  RDW 14.1   CMP   Recent Labs  06/20/13 1350  NA 138  K 4.5  CL 99  CO2 23  GLUCOSE 121*  BUN 13  CREATININE 1.13  CALCIUM 10.3  GFRNONAA 74*  GFRAA 86*   EKG: Inferior and anterior infarct  undetermined age with residual ST elevation in the anterolateral leads  Radiology: Unremarkable cardiomediastinal silhouette, chronic interstitial markings   IMPRESSIONS: 1. Anterolateral and inferior infarction of undetermined age possible recent or acute-with elevation of troponin suspect that the duration of infarct is greater than 12 hours 2. Tobacco abuse 3. Elevation of glucose 4. Elevation of blood pressure  PLAN: Patient will be taken to the cardiac catheterization laboratory for  acute intervention a lot of ongoing chest discomfort. It is suspected that his infarction is subacute the in light of unrelieved pain he will be taken to the catheterization laboratory for further evaluation of his coronary arteries. This was discussed with the patient fully and he is agreeable. Dr. Tamala Julian will be doing the case.  Signed: Kerry Hough MD [facility]  06/20/2013, 3:04 PM

## 2013-06-21 DIAGNOSIS — I2109 ST elevation (STEMI) myocardial infarction involving other coronary artery of anterior wall: Secondary | ICD-10-CM

## 2013-06-21 DIAGNOSIS — I379 Nonrheumatic pulmonary valve disorder, unspecified: Secondary | ICD-10-CM

## 2013-06-21 DIAGNOSIS — F17210 Nicotine dependence, cigarettes, uncomplicated: Secondary | ICD-10-CM | POA: Diagnosis present

## 2013-06-21 LAB — BASIC METABOLIC PANEL
BUN: 13 mg/dL (ref 6–23)
CALCIUM: 8.9 mg/dL (ref 8.4–10.5)
CHLORIDE: 103 meq/L (ref 96–112)
CO2: 25 mEq/L (ref 19–32)
Creatinine, Ser: 1.14 mg/dL (ref 0.50–1.35)
GFR calc non Af Amer: 73 mL/min — ABNORMAL LOW (ref >90)
GFR, EST AFRICAN AMERICAN: 85 mL/min — AB (ref >90)
Glucose, Bld: 111 mg/dL — ABNORMAL HIGH (ref 70–99)
Potassium: 4.4 mEq/L (ref 3.7–5.3)
Sodium: 139 mEq/L (ref 137–147)

## 2013-06-21 LAB — CBC
HCT: 39.1 % (ref 39.0–52.0)
Hemoglobin: 13.1 g/dL (ref 13.0–17.0)
MCH: 31.6 pg (ref 26.0–34.0)
MCHC: 33.5 g/dL (ref 30.0–36.0)
MCV: 94.4 fL (ref 78.0–100.0)
Platelets: DECREASED 10*3/uL (ref 150–400)
RBC: 4.14 MIL/uL — ABNORMAL LOW (ref 4.22–5.81)
RDW: 14.7 % (ref 11.5–15.5)
WBC: 10.8 10*3/uL — ABNORMAL HIGH (ref 4.0–10.5)

## 2013-06-21 LAB — PLATELET COUNT: Platelets: 109 10*3/uL — ABNORMAL LOW (ref 150–400)

## 2013-06-21 LAB — HEPARIN LEVEL (UNFRACTIONATED): Heparin Unfractionated: <0.1 IU/mL — ABNORMAL LOW (ref 0.30–0.70)

## 2013-06-21 LAB — POCT ACTIVATED CLOTTING TIME: ACTIVATED CLOTTING TIME: 127 s

## 2013-06-21 MED ORDER — SPIRONOLACTONE 12.5 MG HALF TABLET
12.5000 mg | ORAL_TABLET | Freq: Every day | ORAL | Status: DC
  Administered 2013-06-21: 12.5 mg via ORAL
  Filled 2013-06-21 (×2): qty 1

## 2013-06-21 MED ORDER — ENOXAPARIN SODIUM 40 MG/0.4ML ~~LOC~~ SOLN
40.0000 mg | SUBCUTANEOUS | Status: DC
  Administered 2013-06-21 – 2013-06-24 (×4): 40 mg via SUBCUTANEOUS
  Filled 2013-06-21 (×5): qty 0.4

## 2013-06-21 MED ORDER — ATROPINE SULFATE 0.1 MG/ML IJ SOLN
INTRAMUSCULAR | Status: AC
  Filled 2013-06-21: qty 10

## 2013-06-21 MED ORDER — NICOTINE 21 MG/24HR TD PT24
21.0000 mg | MEDICATED_PATCH | Freq: Every day | TRANSDERMAL | Status: DC
  Administered 2013-06-22 – 2013-06-25 (×4): 21 mg via TRANSDERMAL
  Filled 2013-06-21 (×5): qty 1

## 2013-06-21 MED ORDER — DIGOXIN 125 MCG PO TABS
0.1250 mg | ORAL_TABLET | Freq: Every day | ORAL | Status: DC
  Administered 2013-06-21 – 2013-06-25 (×5): 0.125 mg via ORAL
  Filled 2013-06-21 (×5): qty 1

## 2013-06-21 NOTE — Progress Notes (Signed)
ANTICOAGULATION CONSULT NOTE - Follow Up  Pharmacy Consult for Heparin Indication: IABP  Allergies  Allergen Reactions  . Celebrex [Celecoxib] Other (See Comments)    Caused his back to burn   Patient Measurements: Height: 5\' 8"  (172.7 cm) Weight: 147 lb 14.9 oz (67.1 kg) IBW/kg (Calculated) : 68.4 Heparin Dosing Weight: 67.1 kg  Vital Signs: Temp: 98.1 F (36.7 C) (05/24 0400) Temp src: Oral (05/24 0400) BP: 104/51 mmHg (05/24 0400) Pulse Rate: 81 (05/24 0400)  Labs:  Recent Labs  06/20/13 1350 06/20/13 2015 06/21/13 0249  HGB 15.9  --  13.1  HCT 45.4  --  39.1  PLT 152  --  PLATELET CLUMPS NOTED ON SMEAR, COUNT APPEARS DECREASED  HEPARINUNFRC  --   --  <0.10*  CREATININE 1.13  --  1.14  CKTOTAL  --  6036*  --   CKMB  --  >300.0*  --    Estimated Creatinine Clearance: 73.6 ml/min (by C-G formula based on Cr of 1.14).  Medical History: History reviewed. No pertinent past medical history.  Medications:  Prescriptions prior to admission  Medication Sig Dispense Refill  . aspirin EC 325 MG tablet Take 325 mg by mouth every morning.        Assessment: CP 53 y/o M smoker with no known PMH presents with ongoing chest pain. STEMI.  Cath 5/23: PTCA +stento to mid-LAD. Chronic total occlusion of the right coronary and moderately severe diffuse circumflex disease. EF 15-20%. IABP x 24-48h.  5/24:  Initial Heparin level < 0.1 on IV rate of 750 units/hr.  The lab was collected at 02:49 and resulted at 04:56AM.  I spoke with his nurse who reports no noted bleeding and no IV line issues.  Goal of Therapy:  Heparin level 0.2-0.5 Monitor platelets by anticoagulation protocol: Yes   Plan:  Will increase rate of IV heparin to 900 units/hr Will check heparin level in 8 hrs and daily.  Rober Minion, PharmD., MS Clinical Pharmacist Pager:  [phone] Thank you for allowing pharmacy to be part of this patients care team. 06/21/2013,5:03 AM

## 2013-06-21 NOTE — Progress Notes (Signed)
SUBJECTIVE:  53 yr old man past medical history of heavy smoking, admitted 5/23 with late-presenting STEMI and ongoing CP underwent emergent cath with DES in LAD and IABP placement on 5/22. Chronic total occlusion of the RCA and 50% diffuse circumflex disease. Left ventriculogram with EF 15-20%.LVEDP 32  IABP is still in place. MAPS 65s. No further CP. Breathing ok   . aspirin  81 mg Oral Daily  . carvedilol  3.125 mg Oral BID WC  . lisinopril  2.5 mg Oral Daily  . ticagrelor  90 mg Oral BID   . heparin 750 Units/hr (06/20/13 1959)  . nitroGLYCERIN 5 mcg/min (06/20/13 2145)    PHYSICAL EXAM Filed Vitals:   06/21/13 0500 06/21/13 0600 06/21/13 0700 06/21/13 0725  BP: 104/59 101/56 112/47 112/47  Pulse: 82 83  86  Temp:    97.7 F (36.5 C)  TempSrc:    Oral  Resp: 24 20  22   Height:      Weight:      SpO2: 98% 99%  98%   General appearance: Lying bed having breakfast.   Head: Normocephalic, without obvious abnormality, atraumatic  Eyes: conjunctivae/corneas clear. PERRL, EOM's intact.  Neck: no adenopathy, no carotid bruit,JVP 7 and supple, symmetrical, trachea midline  Lungs: clear to auscultation bilaterally  Heart: distant HS  regular rate and rhythm, no obvious murmur or gallop Abdomen: soft, non-tender; bowel sounds normal; no masses, no organomegaly  Rectal: deferred  Extremities: extremities normal, atraumatic, no cyanosis or edema. Right groin access site without complications. IABP in place Pulses: 2+ and symmetric  Skin: Skin color, texture, turgor normal. No rashes or lesions  Neurologic: Grossly normal  LABS: No results found for this basename: TROPONINI   Results for orders placed during the hospital encounter of 06/20/13 (from the past 24 hour(s))  CBC     Status: Abnormal   Collection Time    06/20/13  1:50 PM      Result Value Ref Range   WBC 17.0 (*) 4.0 - 10.5 K/uL   RBC 4.93  4.22 - 5.81 MIL/uL   Hemoglobin 15.9  13.0 - 17.0 g/dL   HCT 45.4   39.0 - 52.0 %   MCV 92.1  78.0 - 100.0 fL   MCH 32.3  26.0 - 34.0 pg   MCHC 35.0  30.0 - 36.0 g/dL   RDW 14.1  11.5 - 15.5 %   Platelets 152  150 - 400 K/uL  BASIC METABOLIC PANEL     Status: Abnormal   Collection Time    06/20/13  1:50 PM      Result Value Ref Range   Sodium 138  137 - 147 mEq/L   Potassium 4.5  3.7 - 5.3 mEq/L   Chloride 99  96 - 112 mEq/L   CO2 23  19 - 32 mEq/L   Glucose, Bld 121 (*) 70 - 99 mg/dL   BUN 13  6 - 23 mg/dL   Creatinine, Ser 1.13  0.50 - 1.35 mg/dL   Calcium 10.3  8.4 - 10.5 mg/dL   GFR calc non Af Amer 74 (*) >90 mL/min   GFR calc Af Amer 86 (*) >90 mL/min  I-STAT TROPOININ, ED     Status: Abnormal   Collection Time    06/20/13  1:56 PM      Result Value Ref Range   Troponin i, poc 20.24 (*) 0.00 - 0.08 ng/mL   Comment 3  MRSA PCR SCREENING     Status: None   Collection Time    06/20/13  6:00 PM      Result Value Ref Range   MRSA by PCR NEGATIVE  NEGATIVE  CK TOTAL AND CKMB     Status: Abnormal   Collection Time    06/20/13  8:15 PM      Result Value Ref Range   Total CK 6036 (*) 7 - 232 U/L   CK, MB >300.0 (*) 0.3 - 4.0 ng/mL   Relative Index NOT CALCULATED  0.0 - 2.5  BASIC METABOLIC PANEL     Status: Abnormal   Collection Time    06/21/13  2:49 AM      Result Value Ref Range   Sodium 139  137 - 147 mEq/L   Potassium 4.4  3.7 - 5.3 mEq/L   Chloride 103  96 - 112 mEq/L   CO2 25  19 - 32 mEq/L   Glucose, Bld 111 (*) 70 - 99 mg/dL   BUN 13  6 - 23 mg/dL   Creatinine, Ser 1.14  0.50 - 1.35 mg/dL   Calcium 8.9  8.4 - 10.5 mg/dL   GFR calc non Af Amer 73 (*) >90 mL/min   GFR calc Af Amer 85 (*) >90 mL/min  CBC     Status: Abnormal   Collection Time    06/21/13  2:49 AM      Result Value Ref Range   WBC 10.8 (*) 4.0 - 10.5 K/uL   RBC 4.14 (*) 4.22 - 5.81 MIL/uL   Hemoglobin 13.1  13.0 - 17.0 g/dL   HCT 39.1  39.0 - 52.0 %   MCV 94.4  78.0 - 100.0 fL   MCH 31.6  26.0 - 34.0 pg   MCHC 33.5  30.0 - 36.0 g/dL   RDW 14.7   11.5 - 15.5 %   Platelets    150 - 400 K/uL   Value: PLATELET CLUMPS NOTED ON SMEAR, COUNT APPEARS DECREASED  HEPARIN LEVEL (UNFRACTIONATED)     Status: Abnormal   Collection Time    06/21/13  2:49 AM      Result Value Ref Range   Heparin Unfractionated <0.10 (*) 0.30 - 0.70 IU/mL    Intake/Output Summary (Last 24 hours) at 06/21/13 0817 Last data filed at 06/21/13 0600  Gross per 24 hour  Intake 1532.04 ml  Output   1175 ml  Net 357.04 ml    EKG:  Anteroseptal STE with qwaves and inferior TWI. In NSR  ASSESSMENT AND PLAN:  Principal Problem:   ST elevation myocardial infarction (STEMI) of anterior wall Active Problems:   Acute on chronic systolic heart failure, NYHA class 4   ST elevation (STEMI) myocardial infarction involving left anterior descending coronary artery   Heavy smoker (more than 20 cigarettes per day)   Acute STEMI: post cath 5/23. DES in LAD and IABP placement on 5/22. Chronic total occlusion of the RCA and moderately severe diffuse circumflex disease. Trop >20. Plan  - cont with ASA and Brilinta, and Lipitor - cont with NGT gtt for chest pain - cont with heparin gtt for IABP in place - counseled about smoking cessation - cardiac rehab  Systolic CHF: EF on L ventriculogram 15-20%. IABP in place  Plan  - coreg 3.125 mg bid  - Lisinopril 2.5 mg daily  - Echocardiogram today - IABP for 24-48 hrs.  Smoking: Smokes 2 PPD. Encouraged to quit.  Can start NRT with patches.  Discussed with Dr Haroldine Laws   Signed:  Jessee Avers, MD PGY-2 Internal Medicine Teaching Service Pager: [phone] 06/21/2013, 8:45 AM   Patient seen and examined with Dr. Alice Rieger. We discussed all aspects of the encounter. I agree with the assessment and plan as stated above.   53 y/o male with COPD with late presenting large anterior MI and severe LV dysfunction. No further CP. Stable on IABP. We weaned to 1:2 and watched him and hemodynamics remained stable. Will plan d/c  of IABP. Suspect he will have extensive myocardial damage and may struggle with significant HF in the near future. Once IABP out can support with milrinone as needed. Counseled on need to stop smoking. Start spiro and digoxin. Add lasix as needed.   The patient is critically ill with multiple organ systems failure and requires high complexity decision making for assessment and support, frequent evaluation and titration of therapies, application of advanced monitoring technologies and extensive interpretation of multiple databases.   Critical Care Time personally devoted to patient care services described in this note is 35 Minutes.   Shaune Pascal Kwabena Strutz,MD 12:15 PM

## 2013-06-21 NOTE — Progress Notes (Signed)
Femoral venous sheath removed per protocol by Gigi; site soft c NAB or hematoma; pressure dressing applied; will continue to monitor

## 2013-06-21 NOTE — Progress Notes (Signed)
CRITICAL CARE Performed by: Asencion Gowda   Total critical care time: Ebony Hail Horton,R.N. @ 6:48am  Critical care time was exclusive of separately billable procedures and treating other patients.  Critical care was necessary to treat or prevent imminent or life-threatening deterioration.  Critical care was time spent personally by me on the following activities: development of treatment plan with patient and/or surrogate as well as nursing, discussions with consultants, evaluation of patient's response to treatment, examination of patient, obtaining history from patient or surrogate, ordering and performing treatments and interventions, ordering and review of laboratory studies, ordering and review of radiographic studies, pulse oximetry and re-evaluation of patient's condition.

## 2013-06-21 NOTE — Progress Notes (Signed)
  Echocardiogram 2D Echocardiogram has been performed.  Basilia Jumbo 06/21/2013, 3:53 PM

## 2013-06-22 DIAGNOSIS — F17200 Nicotine dependence, unspecified, uncomplicated: Secondary | ICD-10-CM

## 2013-06-22 DIAGNOSIS — I214 Non-ST elevation (NSTEMI) myocardial infarction: Secondary | ICD-10-CM

## 2013-06-22 LAB — LIPID PANEL
CHOL/HDL RATIO: 3.7 ratio
CHOLESTEROL: 151 mg/dL (ref 0–200)
HDL: 41 mg/dL (ref >39)
LDL Cholesterol: 85 mg/dL (ref 0–99)
Triglycerides: 126 mg/dL (ref <150)
VLDL: 25 mg/dL (ref 0–40)

## 2013-06-22 LAB — CBC
HCT: 41.3 % (ref 39.0–52.0)
Hemoglobin: 14 g/dL (ref 13.0–17.0)
MCH: 31.7 pg (ref 26.0–34.0)
MCHC: 33.9 g/dL (ref 30.0–36.0)
MCV: 93.7 fL (ref 78.0–100.0)
PLATELETS: 108 10*3/uL — AB (ref 150–400)
RBC: 4.41 MIL/uL (ref 4.22–5.81)
RDW: 14.3 % (ref 11.5–15.5)
WBC: 9.3 10*3/uL (ref 4.0–10.5)

## 2013-06-22 LAB — BASIC METABOLIC PANEL
BUN: 17 mg/dL (ref 6–23)
CALCIUM: 9.3 mg/dL (ref 8.4–10.5)
CHLORIDE: 102 meq/L (ref 96–112)
CO2: 26 mEq/L (ref 19–32)
Creatinine, Ser: 1.16 mg/dL (ref 0.50–1.35)
GFR calc Af Amer: 83 mL/min — ABNORMAL LOW (ref >90)
GFR calc non Af Amer: 72 mL/min — ABNORMAL LOW (ref >90)
Glucose, Bld: 96 mg/dL (ref 70–99)
Potassium: 4.5 mEq/L (ref 3.7–5.3)
SODIUM: 139 meq/L (ref 137–147)

## 2013-06-22 LAB — HEMOGLOBIN A1C
Hgb A1c MFr Bld: 6.2 % — ABNORMAL HIGH (ref <5.7)
Mean Plasma Glucose: 131 mg/dL — ABNORMAL HIGH (ref <117)

## 2013-06-22 LAB — TSH: TSH: 2.39 u[IU]/mL (ref 0.350–4.500)

## 2013-06-22 NOTE — Progress Notes (Signed)
TELEMETRY: Reviewed telemetry pt in NSR: Filed Vitals:   06/22/13 0600 06/22/13 0700 06/22/13 0759 06/22/13 0800  BP:  87/66  85/59  Pulse:      Temp: 98 F (36.7 C)  98.3 F (36.8 C)   TempSrc: Oral  Oral   Resp:  21  21  Height:      Weight:      SpO2:        Intake/Output Summary (Last 24 hours) at 06/22/13 0851 Last data filed at 06/22/13 0500  Gross per 24 hour  Intake 1029.5 ml  Output   1250 ml  Net -220.5 ml   Filed Weights   06/20/13 1800  Weight: 147 lb 14.9 oz (67.1 kg)    Subjective Feels well. Up to BR without dizziness. Back pain and arm tightness resolved. No dyspnea.  Marland Kitchen aspirin  81 mg Oral Daily  . carvedilol  3.125 mg Oral BID WC  . digoxin  0.125 mg Oral Daily  . enoxaparin (LOVENOX) injection  40 mg Subcutaneous Q24H  . lisinopril  2.5 mg Oral Daily  . nicotine  21 mg Transdermal Daily  . ticagrelor  90 mg Oral BID    LABS: Basic Metabolic Panel:  Recent Labs  06/21/13 0249 06/22/13 0304  NA 139 139  K 4.4 4.5  CL 103 102  CO2 25 26  GLUCOSE 111* 96  BUN 13 17  CREATININE 1.14 1.16  CALCIUM 8.9 9.3   CBC:  Recent Labs  06/21/13 0249 06/21/13 1335 06/22/13 0304  WBC 10.8*  --  9.3  HGB 13.1  --  14.0  HCT 39.1  --  41.3  MCV 94.4  --  93.7  PLT PLATELET CLUMPS NOTED ON SMEAR, COUNT APPEARS DECREASED 109* 108*   Cardiac Enzymes:  Recent Labs  06/20/13 2015  CKTOTAL 6036*  CKMB >300.0*   Hemoglobin A1C: No results found for this basename: HGBA1C,  in the last 72 hours Fasting Lipid Panel: No results found for this basename: CHOL, HDL, LDLCALC, TRIG, CHOLHDL, LDLDIRECT,  in the last 72 hours Thyroid Function Tests: No results found for this basename: TSH, T4TOTAL, FREET3, T3FREE, THYROIDAB,  in the last 72 hours   Radiology/Studies:  Dg Chest Port 1 View  06/20/2013   CLINICAL DATA:  53 year old male with chest pain and shortness of breath  EXAM: PORTABLE CHEST - 1 VIEW  COMPARISON:  None.  FINDINGS: The  cardiomediastinal silhouette is unremarkable.  Interstitial prominence is present.  There is no evidence of focal airspace disease, suspicious pulmonary nodule/mass, pleural effusion, or pneumothorax. No acute bony abnormalities are identified.  IMPRESSION: Mild interstitial opacities of uncertain chronicity but more likely chronic. Mild interstitial infection/edema is considered less likely.   Electronically Signed   By: Hassan Rowan M.D.   On: 06/20/2013 14:33   Echo: pending.  PHYSICAL EXAM General: Well developed, well nourished, in no acute distress. Head: Normocephalic, atraumatic, sclera non-icteric, oropharynx is clear Neck: Negative for carotid bruits. JVD 6 cm. No adenopathy Lungs: Clear bilaterally to auscultation without wheezes, rales, or rhonchi. Breathing is unlabored. Heart: RRR S1 S2 without murmurs, rubs, or gallops.  Abdomen: Soft, non-tender, non-distended with normoactive bowel sounds. No hepatomegaly. No rebound/guarding. No obvious abdominal masses. Msk:  Strength and tone appears normal for age. Extremities: No clubbing, cyanosis or edema.  Distal pedal pulses are 2+ and equal bilaterally. No hematoma right groin or right wrist. Neuro: Alert and oriented X 3. Moves all extremities spontaneously. Psych:  Responds  to questions appropriately with a normal affect.  ASSESSMENT AND PLAN: 1. Anterior STEMI s/p DES of LAD on 06/19/13 with IABP placement. EF 15-20% by cath. Echo pending. IABP removed yesterday. BP low but patient asymptomatic. On DAPT with ASA and Brilinta.   2. Acute systolic CHF. EF low by cath. Echo pending. Started on low dose coreg and lisinopril. Parameters written for low BP. Will hold aldactone for now. Continue digoxin.   3. Tobacco abuse. Counseled on smoking cessation. On nicotine patch.   4. Lipid status is unknown. On high dose statin. Check lipid levels.   Present on Admission:  . ST elevation myocardial infarction (STEMI) of anterior wall . Acute  on chronic systolic heart failure, NYHA class 4 . ST elevation (STEMI) myocardial infarction involving left anterior descending coronary artery . Heavy smoker (more than 20 cigarettes per day)  Signed, Kirsten Mckone M [place], [place] 06/22/2013 8:51 AM

## 2013-06-22 NOTE — Progress Notes (Signed)
Utilization Review Completed.Summerlynn Glauser T Dowell5/25/2015  

## 2013-06-23 LAB — POCT ACTIVATED CLOTTING TIME: Activated Clotting Time: 736 seconds

## 2013-06-23 LAB — BASIC METABOLIC PANEL
BUN: 18 mg/dL (ref 6–23)
CHLORIDE: 100 meq/L (ref 96–112)
CO2: 24 mEq/L (ref 19–32)
Calcium: 9.5 mg/dL (ref 8.4–10.5)
Creatinine, Ser: 1.04 mg/dL (ref 0.50–1.35)
GFR calc non Af Amer: 82 mL/min — ABNORMAL LOW (ref >90)
Glucose, Bld: 97 mg/dL (ref 70–99)
POTASSIUM: 4.2 meq/L (ref 3.7–5.3)
Sodium: 137 mEq/L (ref 137–147)

## 2013-06-23 LAB — CBC
HEMATOCRIT: 42.1 % (ref 39.0–52.0)
HEMOGLOBIN: 14.6 g/dL (ref 13.0–17.0)
MCH: 32.1 pg (ref 26.0–34.0)
MCHC: 34.7 g/dL (ref 30.0–36.0)
MCV: 92.5 fL (ref 78.0–100.0)
Platelets: 119 10*3/uL — ABNORMAL LOW (ref 150–400)
RBC: 4.55 MIL/uL (ref 4.22–5.81)
RDW: 14.1 % (ref 11.5–15.5)
WBC: 9.2 10*3/uL (ref 4.0–10.5)

## 2013-06-23 MED ORDER — ATORVASTATIN CALCIUM 80 MG PO TABS
80.0000 mg | ORAL_TABLET | Freq: Every day | ORAL | Status: DC
  Administered 2013-06-23 – 2013-06-24 (×2): 80 mg via ORAL
  Filled 2013-06-23 (×3): qty 1

## 2013-06-23 MED ORDER — LOPERAMIDE HCL 2 MG PO CAPS
2.0000 mg | ORAL_CAPSULE | ORAL | Status: DC | PRN
  Administered 2013-06-23: 2 mg via ORAL
  Filled 2013-06-23: qty 1

## 2013-06-23 MED FILL — Sodium Chloride IV Soln 0.9%: INTRAVENOUS | Qty: 50 | Status: AC

## 2013-06-23 NOTE — Care Management Note (Addendum)
    Page 1 of 1   06/25/2013     3:27:25 PM CARE MANAGEMENT NOTE 06/25/2013  Patient:  Christopher [name], Christopher [name]   Account Number:  [id number]  Date Initiated:  06/23/2013  Documentation initiated by:  Elissa Hefty  Subjective/Objective Assessment:   adm w mi     Action/Plan:   lives alone   Anticipated DC Date:     Anticipated DC Plan:        DC Planning Services  CM consult  Medication Assistance      Choice offered to / List presented to:             Status of service:  In process, will continue to follow Medicare Important Message given?  YES (If response is "NO", the following Medicare IM given date fields will be blank) Date Medicare IM given:   Date Additional Medicare IM given:  06/25/2013  Discharge Disposition:  HOME/SELF CARE  Per UR Regulation:  Reviewed for med. necessity/level of care/duration of stay  If discussed at [place] of Stay Meetings, dates discussed:    Comments:  06/25/2013  [employer] life Vest / Contact Eulas Post states patient d/c before referral for life vest completed.  Eulas Post requested one month coverage for patient until FA in place. CM reviewed MR and appears patient has [place]. CM left message with FA, Philipp Ovens to f/u on FA needs if any. Assigned CM, Lynnea Ferrier notified.   5/26 107a debbie dowell rn,bsn gave pt 30 day free and copay assist card for brilinta.

## 2013-06-23 NOTE — Progress Notes (Signed)
06/23/2013 1300  Dr. Neita Garnet aware of BP and no changes made. Latavia Goga Burnett Tilmon Wisehart

## 2013-06-23 NOTE — Progress Notes (Signed)
TELEMETRY: Reviewed telemetry pt in sinus tach 110s Filed Vitals:   06/22/13 2100 06/22/13 2300 06/23/13 0432 06/23/13 0745  BP: 86/62 99/58 93/63  87/62  Pulse:      Temp:  98.4 F (36.9 C)  98.9 F (37.2 C)  TempSrc:  Oral  Oral  Resp:  16 16 18   Height:      Weight:      SpO2:  94% 94% 96%    Intake/Output Summary (Last 24 hours) at 06/23/13 0749 Last data filed at 06/22/13 2300  Gross per 24 hour  Intake    950 ml  Output    600 ml  Net    350 ml   Filed Weights   06/20/13 1800  Weight: 147 lb 14.9 oz (67.1 kg)   Subjective Awake, eating breakfast, no distress. Anxious overnight.  Marland Kitchen aspirin  81 mg Oral Daily  . carvedilol  3.125 mg Oral BID WC  . digoxin  0.125 mg Oral Daily  . enoxaparin (LOVENOX) injection  40 mg Subcutaneous Q24H  . lisinopril  2.5 mg Oral Daily  . nicotine  21 mg Transdermal Daily  . ticagrelor  90 mg Oral BID   LABS: Basic Metabolic Panel:  Recent Labs  06/22/13 0304 06/23/13 0330  NA 139 137  K 4.5 4.2  CL 102 100  CO2 26 24  GLUCOSE 96 97  BUN 17 18  CREATININE 1.16 1.04  CALCIUM 9.3 9.5   CBC:  Recent Labs  06/22/13 0304 06/23/13 0330  WBC 9.3 9.2  HGB 14.0 14.6  HCT 41.3 42.1  MCV 93.7 92.5  PLT 108* 119*   Cardiac Enzymes:  Recent Labs  06/20/13 2015  CKTOTAL 6036*  CKMB >300.0*   Hemoglobin A1C:  Recent Labs  06/22/13 1030  HGBA1C 6.2*   Fasting Lipid Panel:  Recent Labs  06/22/13 1030  CHOL 151  HDL 41  LDLCALC 85  TRIG 126  CHOLHDL 3.7   Thyroid Function Tests:  Recent Labs  06/22/13 1030  TSH 2.390   Echo: pending.  PHYSICAL EXAM General: Well developed, well nourished, in no acute distress. Head: Normocephalic, atraumatic, sclera non-icteric Neck: Negative for carotid bruits Lungs: Clear bilaterally to auscultation without wheezes, rales, or rhonchi. Breathing is unlabored. Heart: RRR S1 S2 without murmurs, rubs, or gallops.  Abdomen: Soft, non-tender, non-distended with  normoactive bowel sounds Msk:  Strength grossly intact Extremities: No clubbing, cyanosis or edema.  -edema Neuro: Alert and oriented X 3. Moves all extremities spontaneously. Psych:  Responds to questions appropriately with a normal affect.  ASSESSMENT AND PLAN: 1. Anterior STEMI s/p DES of LAD on 06/19/13 with IABP placement. EF 15-20% by cath. Echo pending. IABP removed 5/24. BP remains low but patient asymptomatic. LDL 85, A1C 6.2, TSH 2.39 -On DAPT with ASA and Brilinta.  -start statin  2. Acute systolic CHF. EF low by cath. On low dose coreg and lisinopril but did not get yesterday due to persistent hypotension. Holding aldactone for now. Continue digoxin. -echo pending -BB and ACEi if BP can tolerate, holding parameters in place  3. Tobacco abuse. Counseled on smoking cessation.  -On nicotine patch.   Present on Admission:  . Acute on chronic systolic heart failure, NYHA class 4 . ST elevation (STEMI) myocardial infarction involving left anterior descending coronary artery . Heavy smoker (more than 20 cigarettes per day)  Case discussed and patient seen with Dr. Martin[place]  Signed: Jerene Pitch, MD PGY-2, Internal Medicine Resident Pager: [phone]  06/23/2013,7:53  AM  Patient seen and examined and history reviewed. Agree with above findings and plan. Doing well without recurrent angina. Having loose stools maybe related to nervousness. Very anxious. BP still low and limiting medication titration. Will transfer to telemetry today. Echo report pending.   Ander Slade [facility] 06/23/2013 9:52 AM

## 2013-06-23 NOTE — Progress Notes (Signed)
CARDIAC REHAB PHASE I   PRE:  Rate/Rhythm: 93 SR  BP:  Supine:   Sitting: 135/70  Standing:    SaO2:   MODE:  Ambulation: 390 ft   POST:  Rate/Rhythm: 106 ST  BP:  Supine:   Sitting: 106/79  Standing:    SaO2:  1040-1132 Pt walked 390 ft with steady gait but tired by end of walk. No CP. To sitting on side of bed after walk. Pt is so anxious about going back to work, paying rent, meds,etc that he cannot concentrate for ed. He continues to say over and over that he does not know what he is going to do. Had begun MI and stent ed. Discussed importance of brilinta with stent. Pt asking cost as he stated he had bad insurance that is very basic. Pt has brilinta booklet and encouraged him to look at invitation at back for support group. Stopped ed as I feel pt is not ready at this time. Left diet on heart healthy and low sodium. Asked case manager if SW can see pt to assess if anything can be done regarding his concerns.   [name], RN BSN  06/23/2013 11:22 AM

## 2013-06-24 DIAGNOSIS — I509 Heart failure, unspecified: Secondary | ICD-10-CM

## 2013-06-24 LAB — BASIC METABOLIC PANEL
BUN: 22 mg/dL (ref 6–23)
CHLORIDE: 100 meq/L (ref 96–112)
CO2: 24 meq/L (ref 19–32)
Calcium: 9.3 mg/dL (ref 8.4–10.5)
Creatinine, Ser: 1.21 mg/dL (ref 0.50–1.35)
GFR calc Af Amer: 79 mL/min — ABNORMAL LOW (ref >90)
GFR calc non Af Amer: 68 mL/min — ABNORMAL LOW (ref >90)
GLUCOSE: 98 mg/dL (ref 70–99)
POTASSIUM: 4.8 meq/L (ref 3.7–5.3)
Sodium: 136 mEq/L — ABNORMAL LOW (ref 137–147)

## 2013-06-24 LAB — CBC
HCT: 40.6 % (ref 39.0–52.0)
HEMOGLOBIN: 14.1 g/dL (ref 13.0–17.0)
MCH: 32 pg (ref 26.0–34.0)
MCHC: 34.7 g/dL (ref 30.0–36.0)
MCV: 92.3 fL (ref 78.0–100.0)
Platelets: 135 10*3/uL — ABNORMAL LOW (ref 150–400)
RBC: 4.4 MIL/uL (ref 4.22–5.81)
RDW: 14 % (ref 11.5–15.5)
WBC: 7.6 10*3/uL (ref 4.0–10.5)

## 2013-06-24 MED ORDER — PERFLUTREN LIPID MICROSPHERE
1.0000 mL | INTRAVENOUS | Status: AC | PRN
  Administered 2013-06-24: 3 mL via INTRAVENOUS
  Filled 2013-06-24: qty 10

## 2013-06-24 NOTE — Progress Notes (Signed)
CARDIAC REHAB PHASE I   PRE:  Rate/Rhythm: 77SR  BP:  Supine:   Sitting: 93/71  Standing:    SaO2:   MODE:  Ambulation: 460 ft   POST:  Rate/Rhythm: 90 SR  BP:  Supine:   Sitting: 102/66  Standing:    SaO2:  1335-1423 Pt walked 460 ft with steady gait. No CP. Tolerated well. Pt less anxious today and able to concentrate on ed. Stated he felt better since he may be able to get FMLA for this illness. MI education completed. Gave pt CHF booklet and reviewed zones and when to call MD. Pt does not know if he has scales or not but stated would not be a problem to get them. Discussed smoking cessation and gave handouts. Encouraged pt to call 1800quitnow for coaching. Pt is using nicotine patches at this time. Encouraged watching sodium and sugars. Pt has been drinking lots of reg cokes and HGA1C at 6.2.    [name], RN BSN  06/24/2013 2:19 PM

## 2013-06-24 NOTE — Progress Notes (Signed)
Echocardiogram 2D Echocardiogram limited with Definity has been performed.  Christopher [name] 06/24/2013, 1:18 PM

## 2013-06-24 NOTE — Progress Notes (Signed)
Subjective: No SOB, orthopnea, CP, dizziness  Objective: Vital signs in last 24 hours: Temp:  [97.2 F (36.2 C)-98.3 F (36.8 C)] 97.8 F (36.6 C) (05/27 0519) Pulse Rate:  [92-96] 92 (05/27 0519) Resp:  [18-20] 20 (05/27 0519) BP: (75-87)/(52-70) 87/70 mmHg (05/27 0519) SpO2:  [97 %-99 %] 99 % (05/27 0519) Weight:  [140 lb 8 oz (63.73 kg)-141 lb 1.5 oz (64 kg)] 140 lb 8 oz (63.73 kg) (05/27 0519) Last BM Date: 06/23/13  Intake/Output from previous day: 05/26 0701 - 05/27 0700 In: 840 [P.O.:840] Out: -  Intake/Output this shift: Total I/O In: 240 [P.O.:240] Out: -   Medications Current Facility-Administered Medications  Medication Dose Route Frequency Provider Last Rate Last Dose  . aspirin chewable tablet 81 mg  81 mg Oral Daily Belva Crome III, MD   81 mg at 06/23/13 0944  . atorvastatin (LIPITOR) tablet 80 mg  80 mg Oral q1800 Jerene Pitch, MD   80 mg at 06/23/13 1732  . carvedilol (COREG) tablet 3.125 mg  3.125 mg Oral BID WC Peter M Martinique, MD   3.125 mg at 06/21/13 1846  . digoxin (LANOXIN) tablet 0.125 mg  0.125 mg Oral Daily Jolaine Artist, MD   0.125 mg at 06/23/13 0944  . enoxaparin (LOVENOX) injection 40 mg  40 mg Subcutaneous Q24H Harolyn Rutherford, RPH   40 mg at 06/23/13 1508  . lisinopril (PRINIVIL,ZESTRIL) tablet 2.5 mg  2.5 mg Oral Daily Peter M Martinique, MD   2.5 mg at 06/23/13 0946  . loperamide (IMODIUM) capsule 2 mg  2 mg Oral PRN Jerene Pitch, MD   2 mg at 06/23/13 1031  . morphine 4 MG/ML injection 4 mg  4 mg Intravenous Q30 min PRN Richarda Blade, MD   4 mg at 06/20/13 1425  . nicotine (NICODERM CQ - dosed in mg/24 hours) patch 21 mg  21 mg Transdermal Daily Jessee Avers, MD   21 mg at 06/23/13 0948  . ticagrelor (BRILINTA) tablet 90 mg  90 mg Oral BID Belva Crome III, MD   90 mg at 06/23/13 2244    PE: General appearance: alert, cooperative and no distress Lungs: clear to auscultation bilaterally Heart: regular rate and rhythm,  S1, S2 normal, no murmur, click, rub or gallop Extremities: No LEE Pulses: radials 2+ Skin: Warm and dry Neurologic: Grossly normal  Lab Results:   Recent Labs  06/22/13 0304 06/23/13 0330 06/24/13 0455  WBC 9.3 9.2 7.6  HGB 14.0 14.6 14.1  HCT 41.3 42.1 40.6  PLT 108* 119* 135*   BMET  Recent Labs  06/22/13 0304 06/23/13 0330 06/24/13 0455  NA 139 137 136*  K 4.5 4.2 4.8  CL 102 100 100  CO2 26 24 24   GLUCOSE 96 97 98  BUN 17 18 22   CREATININE 1.16 1.04 1.21  CALCIUM 9.3 9.5 9.3   Cholesterol  Recent Labs  06/22/13 1030  CHOL 151   Lipid Panel     Component Value Date/Time   CHOL 151 06/22/2013 1030   TRIG 126 06/22/2013 1030   HDL 41 06/22/2013 1030   CHOLHDL 3.7 06/22/2013 1030   VLDL 25 06/22/2013 1030   LDLCALC 85 06/22/2013 1030     Assessment/Plan  ASSESSMENT AND PLAN:   1. Anterior STEMI s/p DES of LAD on 06/19/13 with IABP placement. EF 15-20% by cath.  IABP removed 5/24. BP remains low but patient asymptomatic. LDL 85, A1C 6.2, TSH 2.39  -  On DAPT with ASA and Brilinta, statin.  Ambulated yesterday with CR 387ft.   Labs stable.  Platelets improving.  Needs outpatient cardiac rehab.  2. Acute systolic CHF. Euvolemic.  EF low by cath. On low dose coreg and lisinopril but did not get yesterday due to persistent hypotension.  Will DC both.  Restart when BP improves.   Continue digoxin.  -echo:  EF 20-25%, LV severely dilated.  Cannot r/o mural apical thrombus.  MRI? Or contrast echo?  -we discussed daily weight monitoring and when to call the office.  We will need to put this in the DC instructions.      3.  Ischemic cardiomyopathy  Dilated LV.  EF 20-25%.  No NSVT on tele in the last 24 hours.  ? Need for lifevest.    4. Tobacco abuse. Counseled on smoking cessation.  -On nicotine patch.   Present on Admission:  . Acute on chronic systolic heart failure, NYHA class 4 . ST elevation (STEMI) myocardial infarction involving left anterior  descending coronary artery . Heavy smoker (more than 20 cigarettes per day)  I also discussed getting FMLA papers from his employer.     LOS: 4 days    Tarri Fuller PA-C 06/24/2013 10:20 AM As above, patient seen and examined. He denies dyspnea or chest pain. Continue dual antiplatelet therapy and statin. At present blood pressure will not allow an ACE inhibitor or beta blocker. Patient counseled on discontinuing tobacco use. Plan repeat echocardiogram with contrast today to exclude thrombus. Ambulate. Discharge tomorrow morning if stable. He will need a repeat echocardiogram in 3 months. His ejection fraction less than 35% he will need ICD. Lelon Perla

## 2013-06-25 LAB — BASIC METABOLIC PANEL
BUN: 19 mg/dL (ref 6–23)
CALCIUM: 9.6 mg/dL (ref 8.4–10.5)
CO2: 25 meq/L (ref 19–32)
CREATININE: 0.96 mg/dL (ref 0.50–1.35)
Chloride: 101 mEq/L (ref 96–112)
GFR calc Af Amer: >90 mL/min (ref >90)
GFR calc non Af Amer: >90 mL/min (ref >90)
Glucose, Bld: 95 mg/dL (ref 70–99)
Potassium: 4.6 mEq/L (ref 3.7–5.3)
Sodium: 138 mEq/L (ref 137–147)

## 2013-06-25 LAB — CBC
HCT: 40.8 % (ref 39.0–52.0)
Hemoglobin: 13.7 g/dL (ref 13.0–17.0)
MCH: 31.6 pg (ref 26.0–34.0)
MCHC: 33.6 g/dL (ref 30.0–36.0)
MCV: 94 fL (ref 78.0–100.0)
PLATELETS: 139 10*3/uL — AB (ref 150–400)
RBC: 4.34 MIL/uL (ref 4.22–5.81)
RDW: 14 % (ref 11.5–15.5)
WBC: 6.7 10*3/uL (ref 4.0–10.5)

## 2013-06-25 MED ORDER — TICAGRELOR 90 MG PO TABS: 90.0000 mg | ORAL_TABLET | Freq: Two times a day (BID) | ORAL | Status: DC

## 2013-06-25 MED ORDER — ATORVASTATIN CALCIUM 80 MG PO TABS: 80.0000 mg | ORAL_TABLET | Freq: Every day | ORAL | Status: DC

## 2013-06-25 MED ORDER — CARVEDILOL 3.125 MG PO TABS: 3.1250 mg | ORAL_TABLET | Freq: Two times a day (BID) | ORAL | Status: DC

## 2013-06-25 MED ORDER — DIGOXIN 125 MCG PO TABS: 0.1250 mg | ORAL_TABLET | Freq: Every day | ORAL | Status: DC

## 2013-06-25 MED ORDER — CARVEDILOL 3.125 MG PO TABS
3.1250 mg | ORAL_TABLET | Freq: Two times a day (BID) | ORAL | Status: DC
  Administered 2013-06-25: 3.125 mg via ORAL
  Filled 2013-06-25 (×3): qty 1

## 2013-06-25 MED ORDER — ASPIRIN 81 MG PO CHEW: 81.0000 mg | CHEWABLE_TABLET | Freq: Every day | ORAL | Status: DC

## 2013-06-25 NOTE — Progress Notes (Signed)
Pt sleeping on and off without c/o CP. Telemetry on SR . Observed closely.

## 2013-06-25 NOTE — Progress Notes (Signed)
Subjective: No SOB, CP or dizziness  Objective: Vital signs in last 24 hours: Temp:  [97.3 F (36.3 C)-98.1 F (36.7 C)] 97.3 F (36.3 C) (05/28 0506) Pulse Rate:  [75-89] 75 (05/28 0506) Resp:  [18-20] 20 (05/28 0506) BP: (94-107)/(63-82) 107/82 mmHg (05/28 0506) SpO2:  [89 %-100 %] 100 % (05/28 0506) Weight:  [141 lb 1.5 oz (64 kg)] 141 lb 1.5 oz (64 kg) (05/28 0506) Last BM Date: 06/23/13  Intake/Output from previous day: 05/27 0701 - 05/28 0700 In: 960 [P.O.:960] Out: 1400 [Urine:1400] Intake/Output this shift:    Medications Current Facility-Administered Medications  Medication Dose Route Frequency Provider Last Rate Last Dose  . aspirin chewable tablet 81 mg  81 mg Oral Daily Belva Crome III, MD   81 mg at 06/24/13 1025  . atorvastatin (LIPITOR) tablet 80 mg  80 mg Oral q1800 Jerene Pitch, MD   80 mg at 06/24/13 1719  . digoxin (LANOXIN) tablet 0.125 mg  0.125 mg Oral Daily Jolaine Artist, MD   0.125 mg at 06/24/13 1024  . enoxaparin (LOVENOX) injection 40 mg  40 mg Subcutaneous Q24H Ruta Hinds von Vajna, RPH   40 mg at 06/24/13 1719  . loperamide (IMODIUM) capsule 2 mg  2 mg Oral PRN Jerene Pitch, MD   2 mg at 06/23/13 1031  . morphine 4 MG/ML injection 4 mg  4 mg Intravenous Q30 min PRN Richarda Blade, MD   4 mg at 06/20/13 1425  . nicotine (NICODERM CQ - dosed in mg/24 hours) patch 21 mg  21 mg Transdermal Daily Jessee Avers, MD   21 mg at 06/24/13 1026  . ticagrelor (BRILINTA) tablet 90 mg  90 mg Oral BID Belva Crome III, MD   90 mg at 06/24/13 2210    PE: General appearance: alert, cooperative and no distress Lungs: clear to auscultation bilaterally Heart: regular rate and rhythm, S1, S2 normal, no murmur, click, rub or gallop Extremities: No LEE Pulses: radials 2+ Skin: Warm and dry Neurologic: Grossly normal  Lab Results:   Recent Labs  06/23/13 0330 06/24/13 0455 06/25/13 0436  WBC 9.2 7.6 6.7  HGB 14.6 14.1 13.7  HCT 42.1 40.6  40.8  PLT 119* 135* 139*   BMET  Recent Labs  06/23/13 0330 06/24/13 0455 06/25/13 0436  NA 137 136* 138  K 4.2 4.8 4.6  CL 100 100 101  CO2 24 24 25   GLUCOSE 97 98 95  BUN 18 22 19   CREATININE 1.04 1.21 0.96  CALCIUM 9.5 9.3 9.6   Cholesterol  Recent Labs  06/22/13 1030  CHOL 151   Lipid Panel     Component Value Date/Time   CHOL 151 06/22/2013 1030   TRIG 126 06/22/2013 1030   HDL 41 06/22/2013 1030   CHOLHDL 3.7 06/22/2013 1030   VLDL 25 06/22/2013 1030   LDLCALC 85 06/22/2013 1030     Assessment/Plan  ASSESSMENT AND PLAN:   1. Anterior STEMI s/p DES of LAD on 06/19/13 with IABP placement. EF 15-20% by cath.  IABP removed 5/24. LDL 85, A1C 6.2, TSH 2.39  -On DAPT with ASA and Brilinta, statin. BP borderline; will add coreg 3.125 mg po BID. Can add ACEI later as outpatient if BP allows. Needs outpatient cardiac rehab.  2. Acute systolic CHF. Euvolemic.  EF low by cath. Continue digoxin.  -echo:  EF 20-25%, LV severely dilated.  Cannot r/o mural apical thrombus.  Await final results of limited FU echo  with contrast. -we discussed daily weight monitoring and when to call the office.  We will need to put this in the DC instructions.      3.  Ischemic cardiomyopathy  Dilated LV.  EF 20-25%.  No NSVT on tele in the last 24 hours. Will need repeat echo in 3 months on meds; if EF < 35 will need ICD.  4. Tobacco abuse. Counseled on smoking cessation.   If FU echo shows no thrombus, DC today and FU with Dr Wynonia Lawman in 2-4 weeks >30 min PA and physician time D2   Katharina Caper

## 2013-06-25 NOTE — Progress Notes (Signed)
Patient ambulatory out of hospital for discharge. Patient picked up by friend.

## 2013-06-25 NOTE — Progress Notes (Signed)
Discussed discharge instructions and d/c medication papers with patient/ Prescriptions for Brilinta, Lipitor, Carvedilol, and  Digoxin given to patient. Follow up appointment given to patient. Note from Doctor excusing patient from work given to patient. Telemetry discontinued and CCMD notified.

## 2013-06-25 NOTE — Discharge Summary (Signed)
Physician Discharge Summary     Patient ID: ATA PECHA MRN: [id number] DOB/AGE: 1960/09/28 53 y.o.  Admit date: 06/20/2013 Discharge date: 06/25/2013  Admission Diagnoses:  ST elevation (STEMI) myocardial infarction involving left anterior descending coronary artery  Discharge Diagnoses:  Principal Problem:   ST elevation (STEMI) myocardial infarction involving left anterior descending coronary artery Active Problems:   Acute on chronic systolic heart failure, NYHA class 4   Heavy smoker (more than 20 cigarettes per day)   Discharged Condition: stable  Hospital Course:   The patient is a 53 year old male presented to the emergency room with severe chest pain. The patient has a history of smoking but has unremarkable past history. He does manual labor usually doing heavy lifting. He was in his usual state of health until April when he began to have exertional left-sided chest discomfort when pushing heavy objects. He sought the attention of a chiropractor and has been receiving treatment since then. He was able to work on Thursday night. Yesterday afternoon he had the onset of left-sided chest pain and it persisted and was severe overnight he was unable to sleep. He was brought by EMS to the emergency room at [facility] long today and his troponin was greater than 20. An EKG showed an anterolateral infarct of undetermined age with residual ST elevation. He continues to complain of chest pain around 7/10 now but was earlier 9/10. The EMS noted some fluctuating ST elevations.   He really doesn't have any prior history of hypertension and does not have a regular doctor. He is no history of hyperlipidemia. As noted above he does smoke.  The patient was taking directly to the cath lab where a left heart cath revealed a totally occluded LAD.  He underwent successful PTCA and stenting of the proximal to mid LAD with DES from 100% to 0% final result. An IABP was placed.  Spironolactone was started but  due to hypotension had to be stopped.  ASA and brilinta were started.   LV gram revealed an EF of 15-20% and an echocardiogram estimated it at 20-25%.  There is inferior and septal akinesis.  There was concern for LV thrombus so a limited contrast echo was completed and negative for thrombus.  He will need a repeat echo in three months.  He had no NSVT on telemetry however, he needs to be considered for ICD if no improvement in EF.  He was initially started on coreg and lisinopril, however, his blood pressure was low and he only received a few doses between the two meds.  They were both DC'd and just the low dose coreg added back.  Will need to reconsider adding the ACE-I back in the future.  He worked with cardiac rehab and during last session walked 426ft with a steady gait.  Tobacco cessation discussed.   We also discussed daily weight monitoring and when to call Dr. Thurman Coyer office.  The patient was seen by Dr. Stanford Breed who felt he was stable for DC home.    Consults: Cardiac Rehab  Significant Diagnostic Studies:   Left heart cath.  CONTRAST: Total of 296 cc.  COMPLICATIONS: None  HEMODYNAMICS: Aortic pressure 110/87 mmHg; LV pressure 111/27 mmHg; LVEDP 32 mmHg  ANGIOGRAPHIC DATA: The left main coronary artery is widely patent but short.  The left anterior descending artery is totally occluded after the first septal perforator and diagonal.  The left circumflex artery is patent with moderate disease noted including ectasia in the mid vessel. There is  50% stenosis of the second obtuse marginal which is the largest of 3..  The right coronary artery is totally occluded in the mid vessel after a severe tortuosity noted in the proximal segment. The first acute marginal branch supplies collaterals to the PDA. The PDA and distal left ventricular branches are also supply by left to right collaterals from the proximal LAD and the circumflex.Marland Kitchen  PCI RESULTS: The LAD is 100% occluded in the proximal  midsegment and reduced to 0% after angioplasty and stenting of a chronically diseased mid segment finishing with 32 mm of overlapping DE stent postdilated to 3.5 mm in diameter. TIMI grade 3 flow was noted to  LEFT VENTRICULOGRAM: Left ventricular angiogram was done in the 30 RAO projection and revealed dilated LV cavity with inferior wall akinesis apical akinesis/dyskinesis and mid and distal anterior wall akinesis. Estimated ejection fraction is 15-20%.  IMPRESSIONS: 1. Acute ST elevation microinfarction, anterior.  2. Successful PTCA and stenting of the proximal to mid LAD with DES from 100% to 0% final result. Time to reperfusion was prolonged due to difficulty crossing the stenosis in the LAD which had obvious severe chronic calcified disease.  3. Chronic total occlusion of the right coronary and moderately severe diffuse circumflex disease.  4. Severe left ventricular dysfunction as described above with LVEF 15-20%  5. Continued chest pain post procedure with a respiratory/pleuritic component potentially compatible with post MI pericarditis playing a role in the patient's ongoing chest pain over the last 24 hours  6. Successful implantation of intra-aortic balloon pump to unload the LV, and improved diastolic myocardial perfusion  RECOMMENDATION:  1. Aspirin and Brilinta  2. IV heparin  3. Low-dose ACE and beta blocker therapy  4. Will need heart failure team evaluation and management  5. Intra-aortic balloon counterpulsation x 24-48 hours before discontinuation   Echocardiogram Study Conclusions  - Left ventricle: Inferior apical and septal akinesis The cavity size was severely dilated. Wall thickness was normal. Systolic function was severely reduced. The estimated ejection fraction was in the range of 20% to 25%. - Left atrium: The atrium was mildly dilated. - Atrial septum: No defect or patent foramen ovale was identified. - Impressions: Cannot r/o mural apical thrombus Consider  MRI or contrast echo to further evaluate.  Impressions:  - Cannot r/o mural apical thrombus Consider MRI or contrast echo to further evaluate.    Treatments: See above  Discharge Exam: Blood pressure 99/63, pulse 94, temperature 97.8 F (36.6 C), temperature source Oral, resp. rate 18, height 5\' 8"  (1.727 m), weight 141 lb 1.5 oz (64 kg), SpO2 100.00%.   Disposition: 01-Home or Self Care      Discharge Instructions   Amb Referral to Cardiac Rehabilitation    Complete by:  As directed      Diet - low sodium heart healthy    Complete by:  As directed      Increase activity slowly    Complete by:  As directed             Medication List    STOP taking these medications       aspirin EC 325 MG tablet  Replaced by:  aspirin 81 MG chewable tablet      TAKE these medications       aspirin 81 MG chewable tablet  Chew 1 tablet (81 mg total) by mouth daily.     atorvastatin 80 MG tablet  Commonly known as:  LIPITOR  Take 1 tablet (80 mg total)  by mouth daily at 6 PM.     carvedilol 3.125 MG tablet  Commonly known as:  COREG  Take 1 tablet (3.125 mg total) by mouth 2 (two) times daily with a meal.     digoxin 0.125 MG tablet  Commonly known as:  LANOXIN  Take 1 tablet (0.125 mg total) by mouth daily.     ticagrelor 90 MG Tabs tablet  Commonly known as:  BRILINTA  Take 1 tablet (90 mg total) by mouth 2 (two) times daily.       Follow-up Information   Follow up with Kerry Hough, MD On 07/03/2013. (9:30AM)    Specialty:  Cardiology   Contact information:   [address] [place] [address] [phone]       Signed: Tarri Fuller, [facility] 06/25/2013, 12:33 PM

## 2013-06-25 NOTE — Discharge Summary (Signed)
See progress notes Christopher [name] S Kesa Birky  

## 2013-06-29 NOTE — Care Management Note (Signed)
    Page 1 of 1   06/25/2013     11:40:40 AM CARE MANAGEMENT NOTE 06/25/2013  Patient:  Christopher [name], Christopher [name]   Account Number:  [id number]  Date Initiated:  06/25/2013  Documentation initiated by:  [facility]  Subjective/Objective Assessment:     Action/Plan:   Anticipated DC Date:  06/25/2013   Anticipated DC Plan:    In-house referral  Clinical Social Worker      DC Planning Services  CM consult  Medication Assistance      Choice offered to / List presented to:             Status of service:  Completed, signed off Medicare Important Message given?  YES (If response is "NO", the following Medicare IM given date fields will be blank) Date Medicare IM given:   Date Additional Medicare IM given:  06/25/2013  Discharge Disposition:  HOME/SELF CARE  Per UR Regulation:  Reviewed for med. necessity/level of care/duration of stay  If discussed at [place] of Stay Meetings, dates discussed:    Comments:  06/25/13 [place], RN, BSN, NCM Spoke to pt at bedside regarding discharge planning.  Pt very axxious and worried about paying billing and medications. NCM suggested he switch pahrmacy for [facility] to [facility] to accss $4 medication list.  NCM also provided Brilinta packet with 30day free card and prescription assistance card intact.  CSW referral made in regards to community resources.

## 2013-07-03 ENCOUNTER — Encounter: Payer: Self-pay | Admitting: Cardiology

## 2013-07-03 DIAGNOSIS — I2510 Atherosclerotic heart disease of native coronary artery without angina pectoris: Secondary | ICD-10-CM

## 2013-07-03 DIAGNOSIS — I5022 Chronic systolic (congestive) heart failure: Secondary | ICD-10-CM | POA: Diagnosis not present

## 2013-07-03 DIAGNOSIS — Z720 Tobacco use: Secondary | ICD-10-CM

## 2013-07-03 DIAGNOSIS — E785 Hyperlipidemia, unspecified: Secondary | ICD-10-CM | POA: Diagnosis present

## 2013-07-03 HISTORY — DX: Atherosclerotic heart disease of native coronary artery without angina pectoris: I25.10

## 2013-07-03 HISTORY — DX: Tobacco use: Z72.0

## 2013-07-03 NOTE — Progress Notes (Signed)
Patient ID: Christopher [name], male   DOB: 1960-05-09, 53 y.o.   MRN: [id number]    Christopher, [name]  Date of visit:  07/03/2013 DOB:  12-23-1960    Age:  53 yrs. Medical record number:  [id number]     Account number:  [phone] Primary Care Provider: none ____________________________ CURRENT DIAGNOSES  1. MI-Recent Anterior  2. CAD,Native  3. Congestive Heart Failure Chronic Systolic  4. Hyperlipidemia  5. Degeneration Of Lumbar Intervertebral Disc  6. Stent Placement (drug eluting) ____________________________ ALLERGIES  Celebrex, Intolerance-unknown ____________________________ MEDICATIONS  1. aspirin 81 mg chewable tablet, 1 p.o. daily  2. atorvastatin 80 mg tablet, 1 p.o. daily  3. carvedilol 3.125 mg tablet, BID  4. digoxin 125 mcg tablet, 1 p.o. daily  5. BRILINTA 90 mg tablet, BID  6. lisinopril 2.5 mg tablet, 1 p.o. daily ____________________________ CHIEF COMPLAINTS  Followup of CAD,Native  Followup of MI-Recent Anterior ____________________________ HISTORY OF PRESENT ILLNESS  This very complicated 53 year old male has essentially no past history but presented to the hospital on May 23 with a delayed presentation of what we felt was an anterior infarction. Because of ongoing pain despite the fact that his enzymes were all very significant elevated, and he was taken to the catheter lab and found to have both an occluded LAD and right coronary artery. The LAD was felt to be the culprit vessel and was stented with 2 stents as detailed below. He required a balloon pump that was later discontinued. He was discharged on low-dose carvedilol and was not really sent home on lisinopril. He was also sent home on digoxin, Brilanta and high-dose statin. The patient currently lives alone and is quite anxious about his financial status. He has very little money and is in the process of applying for disability. He evidently was quite anxious when he was in the hospital. I saw him on the intake  history but essentially had no contact with him in the hospital as he was taken care of by [facility]. He has had no recurrent pain. He lives alone and is worried about how he is going to pay his mortgage, get his medicines and other things. He has not had any shortness of breath. 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 Because of situational stress he has gone back to smoking. He comes in today wearing a life vest although there was very little said in the chart about the need for that. He evidently did not have ventricular tachycardia in the hospital.  ____________________________ PAST HISTORY  Past Medical Illnesses:  hyperlipidemia, lumbar disc disease;  Cardiovascular Illnesses:  S/P MI-anterior May 2015, CAD;  Surgical Procedures:  cyst rt hand;  Cardiology Procedures-Invasive:  cardiac cath (left) May 2015, Xience Stent x 2 3.0 x28 and 3.0 x 8 mm to LAD dr. Tamala Julian 06/20/13;  Cardiology Procedures-Noninvasive:  echocardiogram May 2015;  Cardiac Cath Results:  normal Left main, occluded LAD, 50% stenosis CFX, occluded RCA, right to left collateral, left to right collateral, stnet x 2 to LAD, IAPB;  LVEF of 20% documented via echocardiogram on 06/24/2013,   ____________________________ CARDIO-PULMONARY TEST DATES EKG Date:  07/03/2013;   Cardiac Cath Date:  06/20/2013;  Stent Placement Date: 06/20/2013;  Echocardiography Date: 06/24/2013;  Chest Xray Date: 06/20/2013;   ____________________________ FAMILY HISTORY Brother -- Brother alive and well Brother -- Brother alive and well Brother -- Brother alive and well Father -- Father dead, Pulmonary  emphysema Mother -- Mother dead, CVA, Dementia/Alzheimers Sister -- Sister alive and well ____________________________ SOCIAL HISTORY Alcohol Use:  no alcohol use;  Smoking:  smokes, greater than 50 pack year history;  Lifestyle:  single;  Exercise:  no regular exercise;  Occupation:  unemployed;   Residence:  lives alone;   ____________________________ REVIEW OF SYSTEMS General:  fatigue  Eyes: denies diplopia, history of glaucoma or visual problems. Respiratory: mild dyspnea with exertion Cardiovascular:  please review HPI Abdominal: denies dyspepsia, GI bleeding, constipation, or diarrhea Genitourinary-Male: no dysuria, urgency, frequency, or nocturia  Musculoskeletal:  chronic back pain Psychiatric:  anxiety  ____________________________ PHYSICAL EXAMINATION VITAL SIGNS  Blood Pressure:  104/60 Sitting, Right arm, regular cuff  , 104/62 Standing, Right arm and regular cuff   Pulse:  80/min. Weight:  148.00 lbs. Height:  68"BMI: 22  Constitutional:  axxious, talkative white male in no acute distress Skin:  warm and dry to touch, no apparent skin lesions, or masses noted. Head:  normocephalic, normal hair pattern, no masses or tenderness Neck:  supple, without massess. No JVD, thyromegaly or carotid bruits. Carotid upstroke normal. Chest:  normal symmetry, clear to auscultation. Cardiac:  regular rhythm, normal S1 and S2, No S3 or S4, no murmurs, gallops or rubs detected., wearing life vest Peripheral Pulses:  the femoral,dorsalis pedis, and posterior tibial pulses are full and equal bilaterally with no bruits auscultated. Extremities & Back:  cath site clean and dry Neurological:  no gross motor or sensory deficits noted, affect appropriate, oriented x3. ____________________________ MOST RECENT LIPID PANEL 06/22/13  CHOL TOTL 151 mg/dl, LDL 85 NM, HDL 41 mg/dl, TRIGLYCER 126 mg/dl and CHOL/HDL 3.7 (Calc) ____________________________ IMPRESSIONS/PLAN  1. Recent anterior infarction 2. Chronic systolic congestive heart failure 3. Very poor social situation and anxiety 4. Ongoing tobacco abuse 5. Hyperlipidemia  Recommendations:  Very difficult situation with multiple complex financial and social needs. He is also currently wearing a life vest. I started him because his blood  pressure is up today on lisinopril 2.5 mg daily. He should have a repeat echocardiogram in the first part of July and if his ejection fraction remains low refer for a defibrillator. I would also consider him permanently and totally disabled because of his severe myocardial damage. Note written for work.  ____________________________ Cleda Clarks  1. 12 Lead EKG: Today  2. treadmill:  Regular TM 3 weeks  3. 2D, color flow, doppler: July 8                       ____________________________ Cardiology Physician:  Kerry Hough MD [facility]

## 2013-08-11 ENCOUNTER — Encounter: Payer: Self-pay | Admitting: Cardiology

## 2013-08-11 DIAGNOSIS — I5022 Chronic systolic (congestive) heart failure: Secondary | ICD-10-CM

## 2013-08-11 NOTE — Progress Notes (Unsigned)
Patient ID: Christopher [name], male   DOB: 12-02-1960, 53 y.o.   MRN: [id number]   Dresden, Lozito  Date of visit:  08/11/2013 DOB:  [birth year]    Age:  53 yrs. Medical record number:  [id number]     Account number:  [id number]  Primary Care Provider: None ____________________________ CURRENT DIAGNOSES  1. MI-Recent Anterior  2. CAD,Native  3. Congestive Heart Failure Chronic Systolic  4. Hyperlipidemia  5. Degeneration Of Lumbar Intervertebral Disc  6. Stent Placement (drug eluting) ____________________________ ALLERGIES  Celebrex, Intolerance-unknown ____________________________ MEDICATIONS  1. aspirin 81 mg chewable tablet, 1 p.o. daily  2. BRILINTA 90 mg tablet, BID  3. atorvastatin 80 mg tablet, 1 p.o. daily  4. carvedilol 3.125 mg tablet, BID  5. digoxin 125 mcg tablet, 1 p.o. daily  6. lisinopril 2.5 mg tablet, 1 p.o. daily ____________________________ CHIEF COMPLAINTS  Followup of Congestive Heart Failure Chronic Systolic and anterior MI ____________________________ HISTORY OF PRESENT ILLNESS Christopher [name] returns for cardiac followup. He had a large anterior infarction on May 23 with a delayed presentation. He was found to have both an occluded LAD and an occluded right coronary artery and had 2 stents placed by Dr. Tamala Julian. He was sent home on a life vest and had an echocardiogram done on July 8 that showed akinesis of the anteroapical anteroseptal walls and hypokinesis of the inferior wall with an EF of 30%. He has not had any recurrence of chest pain. His social situation is terrible. He has no income and desperately wants to try to find work. He has no family and no support and lives alone. He is in the process of having to sell things and may lose his house soon. Trying to get social support has been difficult. I have encouraged him to apply for disability. His blood pressure has been soft and he has been on low doses of carvedilol as well as lisinopril. He denies angina and has not had to  use nitroglycerin. He has no PND, orthopnea or edema. ____________________________ PAST HISTORY  Past Medical Illnesses:  hyperlipidemia, lumbar disc disease;  Cardiovascular Illnesses:  S/P MI-anterior May 2015, CAD;  Surgical Procedures:  cyst rt hand;  Cardiology Procedures-Invasive:  cardiac cath (left) May 2015, Xience Stent x 2 3.0 x28 and 3.0 x 8 mm to LAD dr. Tamala Julian 06/20/13;  Cardiology Procedures-Noninvasive:  echocardiogram May 2015, echocardiogram July 2015;  Cardiac Cath Results:  normal Left main, occluded LAD, 50% stenosis CFX, occluded RCA, right to left collateral, left to right collateral, stent x 2 to LAD, IAPB;  LVEF of 20% documented via echocardiogram on 06/24/2013,   ____________________________ CARDIO-PULMONARY TEST DATES EKG Date:  07/03/2013;   Cardiac Cath Date:  06/20/2013;  Stent Placement Date: 06/20/2013;  Echocardiography Date: 08/05/2013;  Chest Xray Date: 06/20/2013;   ____________________________ FAMILY HISTORY Brother -- Brother alive and well Brother -- Brother alive and well Brother -- Brother alive and well Father -- Father dead, Pulmonary emphysema Mother -- Mother dead, CVA, Dementia/Alzheimers Sister -- Sister alive and well ____________________________ SOCIAL HISTORY Alcohol Use:  no alcohol use;  Smoking:  smokes, greater than 50 pack year history;  Lifestyle:  single;  Exercise:  no regular exercise;  Occupation:  unemployed;  Residence:  lives alone;   ____________________________ REVIEW OF SYSTEMS General:  denies recent weight change, fatique or change in exercise tolerance. Eyes: denies diplopia, history of glaucoma or visual problems. Respiratory: mild dyspnea with exertion Cardiovascular:  please review HPI  Genitourinary-Male: no dysuria, urgency, frequency,  or nocturia  Musculoskeletal:  chronic back pain Psychiatric:  anxiety  ____________________________ PHYSICAL EXAMINATION VITAL SIGNS  Blood Pressure:  88/60 Sitting, Left arm, regular  cuff  , 98/64 Standing, Left arm and regular cuff   Pulse:  80/min. Weight:  146.00 lbs. Height:  68"BMI: 22  Constitutional:  anxious talkative white male in no acute distress Skin:  warm and dry to touch, no apparent skin lesions, or masses noted. Head:  normocephalic, normal hair pattern, no masses or tenderness Neck:  supple, without massess. No JVD, thyromegaly or carotid bruits. Carotid upstroke normal. Chest:  normal symmetry, clear to auscultation. Cardiac:  regular rhythm, normal S1 and S2, No S3 or S4, no murmurs, gallops or rubs detected, wearing life vest Peripheral Pulses:  the femoral,dorsalis pedis, and posterior tibial pulses are full and equal bilaterally with no bruits auscultated. Extremities & Back:  no deformities, clubbing, cyanosis, erythema or edema observed. Normal muscle strength and tone. Neurological:  no gross motor or sensory deficits noted, affect appropriate, oriented x3. ____________________________ MOST RECENT LIPID PANEL 06/22/13  CHOL TOTL 151 mg/dl, LDL 85 NM, HDL 41 mg/dl, TRIGLYCER 126 mg/dl and CHOL/HDL 3.7 (Calc) ____________________________ IMPRESSIONS/PLAN  1. Prior anterior infarction with persistently low ejection fraction 30% 2. Hyperlipidemia 3. Very poor social situation  Recommendations:  I spoke to electrophysiology and they will see him to consider a defibrillator. The social situation is really difficult and there are no easy answers here. I encouraged him to continue to apply for disability with his low ejection fraction. We'll see him in followup in 3 months.  ____________________________ TODAYS ORDERS  1.  Consult  EP: Schedule ASAP  2. Return Visit: 3 months                       ____________________________ Cardiology Physician:  Kerry Hough MD [facility]

## 2013-08-19 ENCOUNTER — Ambulatory Visit (INDEPENDENT_AMBULATORY_CARE_PROVIDER_SITE_OTHER): Payer: No Typology Code available for payment source | Admitting: Internal Medicine

## 2013-08-19 ENCOUNTER — Encounter: Payer: Self-pay | Admitting: Internal Medicine

## 2013-08-19 VITALS — BP 80/58 | HR 69 | Ht 68.0 in | Wt 148.0 lb

## 2013-08-19 DIAGNOSIS — I509 Heart failure, unspecified: Secondary | ICD-10-CM

## 2013-08-19 DIAGNOSIS — I5022 Chronic systolic (congestive) heart failure: Secondary | ICD-10-CM

## 2013-08-19 NOTE — Assessment & Plan Note (Signed)
The patient has an ICM with an EF of 30% and class 2 CHF. I discussed ICD implant with the patient . He is very concerned about his finances and has applied for disability. He spent at least 20 minutes discussing his social difficulties. He has an indication for prophylactic ICD implant. He has a life vest in place. I have recommended he undergo watchful waiting and that he attempt to seek additional employment options. I will see him back in 4 months. He will continue with a life vest as a back up. He is encouraged to stop smoking.

## 2013-08-19 NOTE — Progress Notes (Signed)
      HPI Christopher [name] is referred today by Dr. Wynonia Lawman. He is a pleasant 53 yo man with an ICM, chronic systolic heart failure, class 2, who sustained an MI 8 weeks ago. He has not been able to get back to work. He is very anxious about his finances. He is still smoking. He has not missed his medications. He underwent repeat 2D echo last week and told by Dr. Wynonia Lawman that he needed an ICD. He presents for followup. He has occaisional twinges of chest pain. Allergies  Allergen Reactions  . Celebrex [Celecoxib] Other (See Comments)    Caused his back to burn     Current Outpatient Prescriptions  Medication Sig Dispense Refill  . aspirin 81 MG chewable tablet Chew 1 tablet (81 mg total) by mouth daily.      Marland Kitchen atorvastatin (LIPITOR) 80 MG tablet Take 1 tablet (80 mg total) by mouth daily at 6 PM.  30 tablet  5  . carvedilol (COREG) 3.125 MG tablet Take 1 tablet (3.125 mg total) by mouth 2 (two) times daily with a meal.  60 tablet  5  . digoxin (LANOXIN) 0.125 MG tablet Take 1 tablet (0.125 mg total) by mouth daily.  30 tablet  5  . lisinopril (PRINIVIL,ZESTRIL) 2.5 MG tablet Take 2.5 mg by mouth daily.      . ticagrelor (BRILINTA) 90 MG TABS tablet Take 1 tablet (90 mg total) by mouth 2 (two) times daily.  60 tablet  10   No current facility-administered medications for this visit.     History reviewed. No pertinent past medical history.  ROS:   All systems reviewed and negative except as noted in the HPI.   History reviewed. No pertinent past surgical history.   History reviewed. No pertinent family history.   History   Social History  . Marital Status: Single    Spouse Name: N/A    Number of Children: N/A  . Years of Education: N/A   Occupational History  . Not on file.   Social History Main Topics  . Smoking status: Current Every Day Smoker -- 2.00 packs/day for 30 years    Types: Cigarettes  . Smokeless tobacco: Never Used  . Alcohol Use: Yes     Comment: occ  .  Drug Use: Not on file  . Sexual Activity: Not on file   Other Topics Concern  . Not on file   Social History Narrative  . No narrative on file     BP 80/58  Pulse 69  Ht 5\' 8"  (1.727 m)  Wt 148 lb (67.132 kg)  BMI 22.51 kg/m2  Physical Exam:  stable appearing middle aged man, NAD wearing a life vest HEENT: Unremarkable Neck:  7 cm JVD, no thyromegally Back:  No CVA tenderness Lungs:  Clear except for basilar rales HEART:  Regular rate rhythm, no murmurs, no rubs, no clicks Abd:  soft, positive bowel sounds, no organomegally, no rebound, no guarding Ext:  2 plus pulses, no edema, no cyanosis, no clubbing Skin:  No rashes no nodules Neuro:  CN II through XII intact, motor grossly intact  EKG - nsr with anterio and inferior MI   Assess/Plan:

## 2013-08-19 NOTE — Patient Instructions (Signed)
Your physician recommends that you schedule a follow-up appointment in: 4 months with Dr Taylor  

## 2013-08-25 ENCOUNTER — Telehealth: Payer: Self-pay | Admitting: Internal Medicine

## 2013-08-25 NOTE — Telephone Encounter (Signed)
Follow up:    Pt needs a call back again said Dr Wynonia Lawman recommends a device for this pt.    Please give him a call back.  Pt needs medication assistance as well.

## 2013-08-25 NOTE — Telephone Encounter (Signed)
Wants to proceed with ICD  I have scheduled for 8/10 at 7:30am.  Labs on 8/3  Will give instruction sheet then

## 2013-08-25 NOTE — Telephone Encounter (Signed)
New message           Pt says dr Claiborne Billings told him that he is a candidate for a defibrillator / Can he be set up for one?

## 2013-08-27 ENCOUNTER — Encounter (HOSPITAL_COMMUNITY): Payer: Self-pay | Admitting: Pharmacy Technician

## 2013-09-03 ENCOUNTER — Other Ambulatory Visit: Payer: Self-pay | Admitting: *Deleted

## 2013-09-03 ENCOUNTER — Encounter: Payer: Self-pay | Admitting: *Deleted

## 2013-09-03 ENCOUNTER — Other Ambulatory Visit (INDEPENDENT_AMBULATORY_CARE_PROVIDER_SITE_OTHER): Payer: No Typology Code available for payment source

## 2013-09-03 DIAGNOSIS — I5022 Chronic systolic (congestive) heart failure: Secondary | ICD-10-CM

## 2013-09-03 DIAGNOSIS — I509 Heart failure, unspecified: Secondary | ICD-10-CM

## 2013-09-03 LAB — BASIC METABOLIC PANEL
BUN: 18 mg/dL (ref 6–23)
CO2: 27 mEq/L (ref 19–32)
Calcium: 9.4 mg/dL (ref 8.4–10.5)
Chloride: 106 mEq/L (ref 96–112)
Creatinine, Ser: 1.2 mg/dL (ref 0.4–1.5)
GFR: 68.56 mL/min (ref >60.00)
Glucose, Bld: 97 mg/dL (ref 70–99)
Potassium: 4.2 mEq/L (ref 3.5–5.1)
Sodium: 138 mEq/L (ref 135–145)

## 2013-09-03 LAB — CBC WITH DIFFERENTIAL/PLATELET
BASOS ABS: 0 10*3/uL (ref 0.0–0.1)
Basophils Relative: 0.3 % (ref 0.0–3.0)
EOS PCT: 4.7 % (ref 0.0–5.0)
Eosinophils Absolute: 0.4 10*3/uL (ref 0.0–0.7)
HEMATOCRIT: 38.6 % — AB (ref 39.0–52.0)
HEMOGLOBIN: 12.8 g/dL — AB (ref 13.0–17.0)
LYMPHS ABS: 2.2 10*3/uL (ref 0.7–4.0)
Lymphocytes Relative: 24.9 % (ref 12.0–46.0)
MCHC: 33.1 g/dL (ref 30.0–36.0)
MCV: 95.9 fl (ref 78.0–100.0)
MONOS PCT: 6.8 % (ref 3.0–12.0)
Monocytes Absolute: 0.6 10*3/uL (ref 0.1–1.0)
NEUTROS ABS: 5.6 10*3/uL (ref 1.4–7.7)
Neutrophils Relative %: 63.3 % (ref 43.0–77.0)
Platelets: 142 10*3/uL — ABNORMAL LOW (ref 150.0–400.0)
RBC: 4.03 Mil/uL — ABNORMAL LOW (ref 4.22–5.81)
RDW: 15.8 % — AB (ref 11.5–15.5)
WBC: 8.8 10*3/uL (ref 4.0–10.5)

## 2013-09-04 NOTE — Telephone Encounter (Signed)
Discussed with Dr Lovena Le yesterday, which was his first day back and he said ok to proceed.

## 2013-09-07 ENCOUNTER — Encounter (HOSPITAL_COMMUNITY)
Admission: RE | Disposition: A | Discharge status: Home / Self Care | Payer: Self-pay | Source: Ambulatory Visit | Attending: Internal Medicine

## 2013-09-07 ENCOUNTER — Encounter (HOSPITAL_COMMUNITY): Payer: Self-pay | Admitting: *Deleted

## 2013-09-07 ENCOUNTER — Ambulatory Visit (HOSPITAL_COMMUNITY)
Admission: RE | Admit: 2013-09-07 | Discharge: 2013-09-08 | Disposition: A | Discharge status: Home / Self Care | Payer: No Typology Code available for payment source | Source: Ambulatory Visit | Attending: Internal Medicine | Admitting: Internal Medicine

## 2013-09-07 ENCOUNTER — Ambulatory Visit (HOSPITAL_COMMUNITY): Payer: No Typology Code available for payment source

## 2013-09-07 DIAGNOSIS — Z9861 Coronary angioplasty status: Secondary | ICD-10-CM | POA: Insufficient documentation

## 2013-09-07 DIAGNOSIS — I252 Old myocardial infarction: Secondary | ICD-10-CM | POA: Insufficient documentation

## 2013-09-07 DIAGNOSIS — Z7902 Long term (current) use of antithrombotics/antiplatelets: Secondary | ICD-10-CM | POA: Insufficient documentation

## 2013-09-07 DIAGNOSIS — Z7982 Long term (current) use of aspirin: Secondary | ICD-10-CM | POA: Insufficient documentation

## 2013-09-07 DIAGNOSIS — I509 Heart failure, unspecified: Secondary | ICD-10-CM | POA: Insufficient documentation

## 2013-09-07 DIAGNOSIS — I2589 Other forms of chronic ischemic heart disease: Secondary | ICD-10-CM | POA: Insufficient documentation

## 2013-09-07 DIAGNOSIS — I2510 Atherosclerotic heart disease of native coronary artery without angina pectoris: Secondary | ICD-10-CM | POA: Insufficient documentation

## 2013-09-07 DIAGNOSIS — I5022 Chronic systolic (congestive) heart failure: Secondary | ICD-10-CM | POA: Insufficient documentation

## 2013-09-07 DIAGNOSIS — F17200 Nicotine dependence, unspecified, uncomplicated: Secondary | ICD-10-CM | POA: Insufficient documentation

## 2013-09-07 DIAGNOSIS — E785 Hyperlipidemia, unspecified: Secondary | ICD-10-CM | POA: Insufficient documentation

## 2013-09-07 DIAGNOSIS — I498 Other specified cardiac arrhythmias: Secondary | ICD-10-CM | POA: Insufficient documentation

## 2013-09-07 HISTORY — DX: Acute myocardial infarction, unspecified: I21.9

## 2013-09-07 HISTORY — PX: IMPLANTABLE CARDIOVERTER DEFIBRILLATOR IMPLANT: SHX5473

## 2013-09-07 HISTORY — DX: Presence of automatic (implantable) cardiac defibrillator: Z95.810

## 2013-09-07 HISTORY — PX: OTHER SURGICAL HISTORY: SHX169

## 2013-09-07 LAB — SURGICAL PCR SCREEN
MRSA, PCR: NEGATIVE
Staphylococcus aureus: NEGATIVE

## 2013-09-07 SURGERY — IMPLANTABLE CARDIOVERTER DEFIBRILLATOR IMPLANT
Anesthesia: LOCAL

## 2013-09-07 MED ORDER — DIGOXIN 125 MCG PO TABS
0.1250 mg | ORAL_TABLET | Freq: Every day | ORAL | Status: DC
  Administered 2013-09-08: 0.125 mg via ORAL
  Filled 2013-09-07: qty 1

## 2013-09-07 MED ORDER — MUPIROCIN 2 % EX OINT
TOPICAL_OINTMENT | Freq: Two times a day (BID) | CUTANEOUS | Status: DC
  Filled 2013-09-07: qty 22

## 2013-09-07 MED ORDER — CEFAZOLIN SODIUM-DEXTROSE 2-3 GM-% IV SOLR
2.0000 g | Freq: Four times a day (QID) | INTRAVENOUS | Status: AC
  Administered 2013-09-07 – 2013-09-08 (×3): 2 g via INTRAVENOUS
  Filled 2013-09-07 (×3): qty 50

## 2013-09-07 MED ORDER — FENTANYL CITRATE 0.05 MG/ML IJ SOLN
INTRAMUSCULAR | Status: AC
  Filled 2013-09-07: qty 2

## 2013-09-07 MED ORDER — ONDANSETRON HCL 4 MG/2ML IJ SOLN: 4.0000 mg | Freq: Four times a day (QID) | INTRAMUSCULAR | Status: DC | PRN

## 2013-09-07 MED ORDER — ACETAMINOPHEN 325 MG PO TABS: 325.0000 mg | ORAL_TABLET | ORAL | Status: DC | PRN

## 2013-09-07 MED ORDER — LIDOCAINE HCL (PF) 1 % IJ SOLN
INTRAMUSCULAR | Status: AC
  Filled 2013-09-07: qty 30

## 2013-09-07 MED ORDER — HEPARIN (PORCINE) IN NACL 2-0.9 UNIT/ML-% IJ SOLN
INTRAMUSCULAR | Status: AC
  Filled 2013-09-07: qty 500

## 2013-09-07 MED ORDER — MIDAZOLAM HCL 5 MG/5ML IJ SOLN
INTRAMUSCULAR | Status: AC
  Filled 2013-09-07: qty 5

## 2013-09-07 MED ORDER — SODIUM CHLORIDE 0.9 % IJ SOLN
3.0000 mL | INTRAMUSCULAR | Status: DC | PRN
  Administered 2013-09-07: 3 mL via INTRAVENOUS

## 2013-09-07 MED ORDER — SODIUM CHLORIDE 0.9 % IJ SOLN: 3.0000 mL | Freq: Two times a day (BID) | INTRAMUSCULAR | Status: DC

## 2013-09-07 MED ORDER — LISINOPRIL 2.5 MG PO TABS
2.5000 mg | ORAL_TABLET | Freq: Every day | ORAL | Status: DC
  Administered 2013-09-08: 2.5 mg via ORAL
  Filled 2013-09-07: qty 1

## 2013-09-07 MED ORDER — ASPIRIN 81 MG PO CHEW
81.0000 mg | CHEWABLE_TABLET | Freq: Every day | ORAL | Status: DC
  Administered 2013-09-07 – 2013-09-08 (×2): 81 mg via ORAL
  Filled 2013-09-07 (×2): qty 1

## 2013-09-07 MED ORDER — MUPIROCIN 2 % EX OINT
TOPICAL_OINTMENT | CUTANEOUS | Status: AC
  Administered 2013-09-07: 1
  Filled 2013-09-07: qty 22

## 2013-09-07 MED ORDER — ATORVASTATIN CALCIUM 80 MG PO TABS
80.0000 mg | ORAL_TABLET | Freq: Every day | ORAL | Status: DC
  Administered 2013-09-07: 80 mg via ORAL
  Filled 2013-09-07 (×2): qty 1

## 2013-09-07 MED ORDER — SODIUM CHLORIDE 0.9 % IV SOLN: 250.0000 mL | INTRAVENOUS | Status: DC

## 2013-09-07 MED ORDER — CEFAZOLIN SODIUM-DEXTROSE 2-3 GM-% IV SOLR: 2.0000 g | INTRAVENOUS | Status: DC

## 2013-09-07 MED ORDER — CEFAZOLIN SODIUM-DEXTROSE 2-3 GM-% IV SOLR
INTRAVENOUS | Status: AC
  Administered 2013-09-08: 2 g via INTRAVENOUS
  Filled 2013-09-07: qty 50

## 2013-09-07 MED ORDER — CARVEDILOL 3.125 MG PO TABS
3.1250 mg | ORAL_TABLET | Freq: Two times a day (BID) | ORAL | Status: DC
  Administered 2013-09-07 – 2013-09-08 (×2): 3.125 mg via ORAL
  Filled 2013-09-07 (×4): qty 1

## 2013-09-07 MED ORDER — SODIUM CHLORIDE 0.9 % IR SOLN
80.0000 mg | Status: DC
  Filled 2013-09-07: qty 2

## 2013-09-07 MED ORDER — SODIUM CHLORIDE 0.9 % IV SOLN
INTRAVENOUS | Status: DC
  Administered 2013-09-07: 07:00:00 via INTRAVENOUS

## 2013-09-07 NOTE — Progress Notes (Signed)
Orthopedic Tech Progress Note Patient Details:  Christopher [name] [birth year] [id number]  Ortho Devices Type of Ortho Device: Arm sling Ortho Device/Splint Location: LUE Ortho Device/Splint Interventions: Application   Asia R Thompson 09/07/2013, 10:20 AM

## 2013-09-07 NOTE — CV Procedure (Signed)
SURGEON:  [name], MD      PREPROCEDURE DIAGNOSES:   1. Ischemic cardiomyopathy.   2. New York Heart Association class II, heart failure chronically.      POSTPROCEDURE DIAGNOSES:   1. Ischemic cardiomyopathy.   2. New York Heart Association class II heart failure chronically.      PROCEDURES:    1. ICD implantation.  3. Defibrillation threshold testing     INTRODUCTION:  Christopher [name] is a 53 y.o. male with an ischemic CM (EF 30%), NYHA Class II CHF, and CAD. At this time, he meets MADIT II criteria for ICD implantation for primary prevention of sudden death.  The patient has a narrow QRS and does not meet criteria for revascularization.  The patient has been treated with an optimal medical regimen but continues to have a depressed ejection fraction and NYHA Class II CHF symptoms.  The patient therefore  presents today for ICD implantation.      DESCRIPTION OF PROCEDURE:  Informed written consent was obtained and the patient was brought to the electrophysiology lab in the fasting state. The patient was adequately sedated with intravenous Versed, and fentanyl as outlined in the nursing report.  The patient's left chest was prepped and draped in the usual sterile fashion by the EP lab staff.  The skin overlying the left deltopectoral region was infiltrated with lidocaine for local analgesia.  A 5-cm incision was made over the left deltopectoral region.  A left subcutaneous defibrillator pocket was fashioned using a combination of sharp and blunt dissection.  Electrocautery was used to assure hemostasis.    RV Lead Placement: The left axillary vein was cannulated with fluoroscopic visualization.  No contrast was required for this endeavor.  Through the left axillary vein, a Medtronic [id number] active fixation (serial number [id number]) right ventricular defibrillator lead was advanced with fluoroscopic guidance into the right ventricular apical septal positions respectively.  The right ventricular  lead R-wave measured 15 mV with impedance of 735 ohms and a threshold of 0.8 volts at 0.5 milliseconds.   The leads were secured to the pectoralis  fascia using #2 silk suture over the suture sleeves.  The pocket then  irrigated with copious gentamicin solution.  The leads were then  connected to a medtronicEvera XT VR (serial  Number [id number]) ICD.  The defibrillator was placed into the  pocket.  The pocket was then closed in 2 layers with 2.0 Vicryl suture  for the subcutaneous and subcuticular layers.  Steri-Strips and a  sterile dressing were then applied.   DFT Testing: Defibrillation Threshold testing was then performed. Ventricular fibrillation was induced with a T shock.  Adequate sensing of ventricular fibrillation was observed with minimal dropout with a programmed sensitivity of 1.71mV.  The patient was successfully defibrillated to sinus rhythm with a single 20 joule shock delivered from the device with an impedance of 58 ohms in a duration of 4.6 seconds.  The patient remained in sinus rhythm thereafter.  There were no early apparent complications.     CONCLUSIONS:   1. Ischemic cardiomyopathy with chronic New York Heart Association class II heart failure.   2. Successful ICD implantation.   3. DFT less than or equal to 28 joules.   4. No early apparent complications.   [name], MD  9:06 AM 09/07/2013

## 2013-09-07 NOTE — Interval H&P Note (Signed)
History and Physical Interval Note: In the interim, I have discussed the case with Dr. Wynonia Lawman and Mr. Whidbee. Dr. Wynonia Lawman feels like Christopher [name] late presentation will make return of LV function extraordinarily unlikely. He has an ischemic CM, with an EF of 30% on maximal medical therapy.  Will plan to proceed with ICD Implant. Will place Medtronic device.   09/07/2013 7:05 AM  Christopher [name]  has presented today for surgery, with the diagnosis of bradycardia  The various methods of treatment have been discussed with the patient and family. After consideration of risks, benefits and other options for treatment, the patient has consented to  Procedure(s): IMPLANTABLE CARDIOVERTER DEFIBRILLATOR IMPLANT (N/A) as a surgical intervention .  The patient's history has been reviewed, patient examined, no change in status, stable for surgery.  I have reviewed the patient's chart and labs.  Questions were answered to the patient's satisfaction.     Mikle Bosworth.D.

## 2013-09-07 NOTE — H&P (View-Only) (Signed)
      HPI [name] is referred today by Dr. Wynonia Lawman. He is a pleasant 53 yo man with an ICM, chronic systolic heart failure, class 2, who sustained an MI 8 weeks ago. He has not been able to get back to work. He is very anxious about his finances. He is still smoking. He has not missed his medications. He underwent repeat 2D echo last week and told by Dr. Wynonia Lawman that he needed an ICD. He presents for followup. He has occaisional twinges of chest pain. Allergies  Allergen Reactions  . Celebrex [Celecoxib] Other (See Comments)    Caused his back to burn     Current Outpatient Prescriptions  Medication Sig Dispense Refill  . aspirin 81 MG chewable tablet Chew 1 tablet (81 mg total) by mouth daily.      Marland Kitchen atorvastatin (LIPITOR) 80 MG tablet Take 1 tablet (80 mg total) by mouth daily at 6 PM.  30 tablet  5  . carvedilol (COREG) 3.125 MG tablet Take 1 tablet (3.125 mg total) by mouth 2 (two) times daily with a meal.  60 tablet  5  . digoxin (LANOXIN) 0.125 MG tablet Take 1 tablet (0.125 mg total) by mouth daily.  30 tablet  5  . lisinopril (PRINIVIL,ZESTRIL) 2.5 MG tablet Take 2.5 mg by mouth daily.      . ticagrelor (BRILINTA) 90 MG TABS tablet Take 1 tablet (90 mg total) by mouth 2 (two) times daily.  60 tablet  10   No current facility-administered medications for this visit.     History reviewed. No pertinent past medical history.  ROS:   All systems reviewed and negative except as noted in the HPI.   History reviewed. No pertinent past surgical history.   History reviewed. No pertinent family history.   History   Social History  . Marital Status: Single    Spouse Name: N/A    Number of Children: N/A  . Years of Education: N/A   Occupational History  . Not on file.   Social History Main Topics  . Smoking status: Current Every Day Smoker -- 2.00 packs/day for 30 years    Types: Cigarettes  . Smokeless tobacco: Never Used  . Alcohol Use: Yes     Comment: occ  .  Drug Use: Not on file  . Sexual Activity: Not on file   Other Topics Concern  . Not on file   Social History Narrative  . No narrative on file     BP 80/58  Pulse 69  Ht 5\' 8"  (1.727 m)  Wt 148 lb (67.132 kg)  BMI 22.51 kg/m2  Physical Exam:  stable appearing middle aged man, NAD wearing a life vest HEENT: Unremarkable Neck:  7 cm JVD, no thyromegally Back:  No CVA tenderness Lungs:  Clear except for basilar rales HEART:  Regular rate rhythm, no murmurs, no rubs, no clicks Abd:  soft, positive bowel sounds, no organomegally, no rebound, no guarding Ext:  2 plus pulses, no edema, no cyanosis, no clubbing Skin:  No rashes no nodules Neuro:  CN II through XII intact, motor grossly intact  EKG - nsr with anterio and inferior MI   Assess/Plan:

## 2013-09-08 ENCOUNTER — Ambulatory Visit (HOSPITAL_COMMUNITY): Payer: No Typology Code available for payment source

## 2013-09-08 DIAGNOSIS — I2589 Other forms of chronic ischemic heart disease: Secondary | ICD-10-CM

## 2013-09-08 NOTE — Discharge Summary (Signed)
ELECTROPHYSIOLOGY PROCEDURE DISCHARGE SUMMARY    Patient ID: Christopher [name],  MRN: [id number], DOB/AGE: Mar 03, 1960 53 y.o.  Admit date: 09/07/2013 Discharge date: 09/08/2013  Primary Care Physician: Default, Provider, MD Primary Cardiologist: Wynonia Lawman Electrophysiologist: Lovena Le  Primary Discharge Diagnosis:  Ischemic cardiomyopathy status post ICD implantation this admission  Secondary Discharge Diagnosis:  1.  Chronic systolic heart failure - class II 2.  CAD s/p MI (PCI to RCA) 3.  Hyperlipidemia   Allergies  Allergen Reactions  . Celebrex [Celecoxib] Other (See Comments)    Caused his back to burn     Procedures This Admission:  1.  Implantation of a MDT single chamber ICD on 09-07-13 by Dr Lovena Le.  The patient received a MDT model number Evera XT ICD with model number [phone] right ventricular lead.  DFT's were successful at 60 J.  There were no immediate post procedure complications. 2.  CXR on 09-08-13 demonstrated no pneumothorax status post device implantation.   Brief HPI: Christopher [name] is a 53 y.o. male was referred to electrophysiology in the outpatient setting for consideration of ICD implantation.  Past medical history includes ischemic cardiomyopathy, CAD, and systolic heart failure.  The patient has persistent LV dysfunction despite guideline directed therapy.  Risks, benefits, and alternatives to ICD implantation were reviewed with the patient who wished to proceed.   Hospital Course:  The patient was admitted and underwent implantation of a MDT ICD with details as outlined above.   He was monitored on telemetry overnight which demonstrated sinus rhythm with occasional PVC's. He was initially hypotensive following the procedure, and a 2-D echo demonstrated no pericardial effusion. His hypotension was thought secondary to significant Versed and fentanyl. Following his procedure, the left chest was without hematoma or ecchymosis.  The device was interrogated and  found to be functioning normally.  CXR was obtained and demonstrated no pneumothorax status post device implantation.  Wound care, arm mobility, and restrictions were reviewed with the patient.  Dr Lovena Le examined the patient and considered them stable for discharge to home.   The patient's discharge medications include an ACE-I (Lisinopril) and beta blocker (Carvedilol).   Discharge Vitals: Blood pressure 107/78, pulse 64, temperature 97.9 F (36.6 C), temperature source Oral, resp. rate 18, height 5\' 8"  (1.727 m), weight 141 lb (63.957 kg), SpO2 97.00%.   Labs:   Lab Results  Component Value Date   WBC 8.8 09/03/2013   HGB 12.8* 09/03/2013   HCT 38.6* 09/03/2013   MCV 95.9 09/03/2013   PLT 142.0* 09/03/2013     Recent Labs Lab 09/03/13 1018  NA 138  K 4.2  CL 106  CO2 27  BUN 18  CREATININE 1.2  CALCIUM 9.4  GLUCOSE 97     Discharge Medications:    Medication List         aspirin 81 MG chewable tablet  Chew 1 tablet (81 mg total) by mouth daily.     atorvastatin 80 MG tablet  Commonly known as:  LIPITOR  Take 1 tablet (80 mg total) by mouth daily at 6 PM.     carvedilol 3.125 MG tablet  Commonly known as:  COREG  Take 1 tablet (3.125 mg total) by mouth 2 (two) times daily with a meal.     digoxin 0.125 MG tablet  Commonly known as:  LANOXIN  Take 1 tablet (0.125 mg total) by mouth daily.     lisinopril 2.5 MG tablet  Commonly known as:  PRINIVIL,ZESTRIL  Take  2.5 mg by mouth daily.     ticagrelor 90 MG Tabs tablet  Commonly known as:  BRILINTA  Take 1 tablet (90 mg total) by mouth 2 (two) times daily.        Disposition:  Discharge Instructions   Call MD for:  redness, tenderness, or signs of infection (pain, swelling, redness, odor or green/yellow discharge around incision site)    Complete by:  As directed      Diet - low sodium heart healthy    Complete by:  As directed      Increase activity slowly    Complete by:  As directed           Follow-up  Information   Follow up with [facility]ound [facility]Device [facility] On 09/17/2013. (4:30PM)    Contact information:   [address] [address] [phone]      Follow up with [name], MD On 12/23/2013. (8:00 AM)    Specialty:  Cardiology   Contact information:   [address] [address] [phone]       Duration of Discharge Encounter: less than 30 minutes including physician time.  Signed,  Corliss Parish  EP attending  Patient seen and examined. I've modify the note above. Agree with above. Patient ready for discharge.  [name], M.D.

## 2013-09-08 NOTE — Progress Notes (Signed)
Pt discharged home by himself.  Dr Lovena Le aware and Laurie Panda him to drive home by himself.  Reviewed discharge instructions and education, all questions answered. Assessment unchanged from earlier.

## 2013-09-08 NOTE — Care Management Note (Signed)
    Page 1 of 1   09/08/2013     10:36:11 AM CARE MANAGEMENT NOTE 09/08/2013  Patient:  Christopher [name], Christopher [name]   Account Number:  [id number]  Date Initiated:  09/08/2013  Documentation initiated by:  GRAVES-BIGELOW,Gwynneth Fabio  Subjective/Objective Assessment:   Pt admitted for bradycardia ICD placement.     Action/Plan:   Per pt he has a home under short contract to be sold. Pt is still living in home. Pt has filled out an application for [employer]. CM did suggest he call them to see how far he is on the list for housing.   Anticipated DC Date:  09/08/2013   Anticipated DC Plan:  [place]  CM consult  Medication Assistance  [facility]      Choice offered to / List presented to:             Status of service:  Completed, signed off Medicare Important Message given?  NO (If response is "NO", the following Medicare IM given date fields will be blank) Date Medicare IM given:   Medicare IM given by:   Date Additional Medicare IM given:   Additional Medicare IM given by:    Discharge Disposition:  HOME/SELF CARE  Per UR Regulation:  Reviewed for med. necessity/level of care/duration of stay  If discussed at [place] of Stay Meetings, dates discussed:    Comments:  09-08-13 [address] [place] [phone] Per pt he has medications available until 09-19-13. CM did call the [facility] and spoke to [place]. [place] unable to schedule appointment however best number provided @ [phone] to clinic and they will call pt for hospital f/u. Pt was given [facility] flyer to call them as well. Once pt is established he will be able to get assistance with meds and orange card. No medication coverage via insurance. CM did ask if family could be of any help and pt stated they are unable to do so at this time. CM did suggest [facility] for $4.00 meds. Pt already getting Brilinta via the [facility] program for 1 year. No further needs  from CM at this time.

## 2013-09-08 NOTE — Discharge Instructions (Signed)
° ° °  Supplemental Discharge Instructions for  Pacemaker/Defibrillator Patients  Activity No heavy lifting or vigorous activity with your left/right arm for 6 to 8 weeks.  Do not raise your left/right arm above your head for one week.  Gradually raise your affected arm as drawn below.           __  NO DRIVING for one week ; you may begin driving on  September 15, 2013   .  WOUND CARE   Keep the wound area clean and dry.  Do not get this area wet for one week. No showers for one week; you may shower on September 15, 2013    .   The tape/steri-strips on your wound will fall off; do not pull them off.  No bandage is needed on the site.  DO  NOT apply any creams, oils, or ointments to the wound area.   If you notice any drainage or discharge from the wound, any swelling or bruising at the site, or you develop a fever > 101? F after you are discharged home, call the office at once.  Special Instructions   You are still able to use cellular telephones; use the ear opposite the side where you have your pacemaker/defibrillator.  Avoid carrying your cellular phone near your device.   When traveling through airports, show security personnel your identification card to avoid being screened in the metal detectors.  Ask the security personnel to use the hand wand.   Avoid arc welding equipment, MRI testing (magnetic resonance imaging), TENS units (transcutaneous nerve stimulators).  Call the office for questions about other devices.   Avoid electrical appliances that are in poor condition or are not properly grounded.   Microwave ovens are safe to be near or to operate.  Additional information for defibrillator patients should your device go off:   If your device goes off ONCE and you feel fine afterward, notify the device clinic nurses.   If your device goes off ONCE and you do not feel well afterward, call 911.   If your device goes off TWICE, call 911.   If your device goes off THREE times in one day,  call 911.  DO NOT DRIVE YOURSELF OR A FAMILY MEMBER WITH A DEFIBRILLATOR TO THE HOSPITAL--CALL 911.

## 2013-09-09 ENCOUNTER — Ambulatory Visit (HOSPITAL_BASED_OUTPATIENT_CLINIC_OR_DEPARTMENT_OTHER): Payer: No Typology Code available for payment source | Admitting: Internal Medicine

## 2013-09-09 ENCOUNTER — Encounter: Payer: Self-pay | Admitting: Internal Medicine

## 2013-09-09 VITALS — BP 108/71 | HR 82 | Temp 97.7°F | Resp 20 | Ht 68.0 in | Wt 142.0 lb

## 2013-09-09 DIAGNOSIS — I5022 Chronic systolic (congestive) heart failure: Secondary | ICD-10-CM

## 2013-09-09 DIAGNOSIS — I509 Heart failure, unspecified: Secondary | ICD-10-CM

## 2013-09-09 MED ORDER — CARVEDILOL 3.125 MG PO TABS
3.1250 mg | ORAL_TABLET | Freq: Two times a day (BID) | ORAL | Status: DC
Start: 2013-09-09 — End: 2015-01-17

## 2013-09-09 MED ORDER — DIGOXIN 125 MCG PO TABS: 0.1250 mg | ORAL_TABLET | Freq: Every day | ORAL | Status: DC

## 2013-09-09 MED ORDER — ASPIRIN 81 MG PO CHEW: 81.0000 mg | CHEWABLE_TABLET | Freq: Every day | ORAL | Status: DC

## 2013-09-09 MED ORDER — LISINOPRIL 2.5 MG PO TABS: 2.5000 mg | ORAL_TABLET | Freq: Every day | ORAL | Status: DC

## 2013-09-09 MED ORDER — ATORVASTATIN CALCIUM 80 MG PO TABS: 80.0000 mg | ORAL_TABLET | Freq: Every day | ORAL | Status: DC

## 2013-09-09 NOTE — Patient Instructions (Signed)
Cardioverter Defibrillator Implantation, Care After Refer to this sheet in the next few weeks. These instructions provide you with information on caring for yourself after your procedure. Your health care provider may also give you more specific instructions. Your treatment has been planned according to current medical practices, but problems sometimes occur. Call your health care provider if you have any problems or questions after your procedure.  WHAT TO EXPECT AFTER THE PROCEDURE  You may feel pain. Some pain is normal. It may last a few days.  A slight bump may be seen over the skin where the device was placed. Sometimes, it is possible to feel the device under the skin. This is normal.  In the months and years afterward, your health care provider will check the device, the leads, and the battery every few months. Eventually, when the battery is low, the device will be replaced. HOME CARE INSTRUCTIONS  Medicines  Take medicines only as directed by your health care provider.  If you were prescribed an antibiotic medicine, finish it all even if you start to feel better.   Do not take any other medicines without asking your health care provider first. Some medicines, including certain painkillers, can cause bleeding after surgery.  Wound Care  Do not remove the bandage on your chest until directed to do so by your health care provider.  Once your bandage is removed, you may see pieces of tape called skin adhesive strips over the area where the cut was made (incision site). Let them fall off on their own.   Check the incision site every day to make sure it is not infected, bleeding, or starting to pull apart.  Do not use lotions or ointments near the incision site unless directed to do so.   Keep the incision area clean and dry for 2-3 days after the procedure or as directed by your health care provider. It takes several weeks for the incision site to completely heal.   Do not  take baths, swim, or use a hot tub until your health care provider approves. Activities  Try to walk a little every day. Exercising is important after this procedure. It is also important to use your shoulder on the side of the pacemaker in daily tasks that do not require exaggerated motion.  Avoid sudden jerking, pulling, or chopping movements that pull your upper arm far away from your body for at least 6 weeks.  Do not lift your upper arm above your shoulders for at least 6 weeks. This means no tennis, golf, or swimming for this period of time. If you sleep with the arm above your head, use a restraint to prevent this from happening as you sleep.  You may go back to work when your health care provider says it is okay. Check with your health care provider before you start to drive or play sports.  Other Instructions  Follow diet instructions if they were provided. You should be able to eat what you usually do right away, but you may need to limit your salt intake.   Weigh yourself every day. If you suddenly gain weight, fluid may be building up in your body.   Always carry your pacemaker identification card with you. The card should list the implant date, device model, and manufacturer. Consider wearing a medical alert bracelet or necklace.  Tell all health care providers that you have a pacemaker. This may prevent them from giving you a magnetic resonance imaging (MRI) scan because of  the strong magnets used during that test.  If you must pass through a metal detector, quickly walk through it. Do not stop under the detector or stand near it.  Avoid places or objects with a strong electric or magnetic field, including:   Engineer, maintenance. When at the airport, let officials know you have a pacemaker. Your ID card will let you be checked in a way that is safe for you and that will not damage your pacemaker. Also, do not let a security person wave a magnetic wand near your  pacemaker. That can make it stop working.  Power plants.   Large electrical generators.   Radiofrequency transmission towers, such as cell phone and radio towers.   Do not use amateur (ham) radio equipment or electric (arc) welding torches. Some devices are safe to use if held at least 1 foot from your pacemaker. These include power tools, lawn mowers, and speakers. If you are unsure of whether something is safe to use, ask your health care provider.   You may safely use electric blankets, heating pads, computers, and microwave ovens.   When using your cell phone, hold it to the ear opposite the pacemaker. Do not leave your cell phone in a pocket over the pacemaker.   Keep all follow-up visits as directed by your health care provider. This is how your health care provider makes sure your chest is healing the way it should. Ask your health care provider when you should come back to have your stitches or staples taken out.   Have your pacemaker checked every 3-6 months or as directed by your health care provider. Most pacemakers last for 4-8 years before a new one is needed. SEEK MEDICAL CARE IF:   You feel one shock in your chest.  You gain weight suddenly.   Your legs or feet swell more than they have before.   It feels like your heart is fluttering or skipping beats (heart palpitations).  You have a fever. SEEK IMMEDIATE MEDICAL CARE IF:   You have chest pain.  You feel more than one shock.  You feel more short of breath than you have felt before.  You feel more light-headed than you have felt before.  You have problems with your incision site, such as swelling or bleeding, or it starts to open up.   You notice signs of infection around your incision site. Watch for:   Warmth.   Redness.   Worsening pain.   Swelling.   Fluid leaking from the incision site.  Document Released: 08/04/2004 Document Revised: 06/01/2013 Document Reviewed:  02/06/2012 [facility] Patient Information 2015 [place], [place]. This information is not intended to replace advice given to you by your health care provider. Make sure you discuss any questions you have with your health care provider. Cardioverter Defibrillator Implantation An implantable cardioverter defibrillator (ICD) is a small, lightweight, battery-powered device that is placed (implanted) under the skin in the chest or abdomen. Your caregiver may prescribe an ICD if:  You have had an irregular heart rhythm (arrhythmia) that originated in the lower chambers of the heart (ventricles).  Your heart has been damaged by a disease (such as coronary artery disease) or heart condition (such as a heart attack). An ICD consists of a battery that lasts several years, a small computer called a pulse generator, and wires called leads that go into the heart. It is used to detect and correct two dangerous arrhythmias: a rapid heart rhythm (tachycardia) and an arrhythmia  in which the ventricles contract in an uncoordinated way (fibrillation). When an ICD detects tachycardia, it sends an electrical signal to the heart that restores the heartbeat to normal (cardioversion). This signal is usually painless. If cardioversion does not work or if the ICD detects fibrillation, it delivers a small electrical shock to the heart (defibrillation) to restart the heart. The shock may feel like a strong jolt in the chest.ICDs may be programmed to correct other problems. Sometimes, ICDs are programmed to act as another type of implantable device called a pacemaker. Pacemakers are used to treat a slow heartbeat (bradycardia). LET YOUR CAREGIVER KNOW ABOUT:  Any allergies you have.  All medicines you are taking, including vitamins, herbs, eyedrops, and over-the-counter medicines and creams.  Previous problems you or members of your family have had with the use of anesthetics.  Any blood disorders you have had.  Other health  problems you have. RISKS AND COMPLICATIONS Generally, the procedure to implant an ICD is safe. However, as with any surgical procedure, complications can occur. Possible complications associated with implanting an ICD include:  Swelling, bleeding, or bruising at the site where the ICD was implanted.  Infection at the site where the ICD was implanted.  A reaction to medicine used during the procedure.  Nerve, heart, or blood vessel damage.  Blood clots. BEFORE THE PROCEDURE  You may need to have blood tests, heart tests, or a chest X-ray done before the day of the procedure.  Ask your caregiver about changing or stopping your regular medicines.  Make plans to have someone drive you home. You may need to stay in the hospital overnight after the procedure.  Stop smoking at least 24 hours before the procedure.  Take a bath or shower the night before the procedure. You may need to scrub your chest or abdomen with a special type of soap.  Do not eat or drink before your procedure for as long as directed by your caregiver. Ask if it is okay to take any needed medicine with a small sip of water. PROCEDURE  The procedure to implant an ICD in your chest or abdomen is usually done at a hospital in a room that has a large X-ray machine called a fluoroscope. The machine will be above you during the procedure. It will help your caregiver see your heart during the procedure. Implanting an ICD usually takes 1-3 hours. Before the procedure:   Small monitors will be put on your body. They will be used to check your heart, blood pressure, and oxygen level.  A needle will be put into a vein in your hand or arm. This is called an intravenous (IV) access tube. Fluids and medicine will flow directly into your body through the IV tube.  Your chest or abdomen will be cleaned with a germ-killing (antiseptic) solution. The area may be shaved.  You may be given medicine to help you relax (sedative).  You  will be given a medicine called a local anesthetic. This medicine will make the surgical site numb while the ICD is implanted. You will be sleepy but awake during the procedure. After you are numb the procedure will begin. The caregiver will:  Make a small cut (incision). This will make a pocket deep under your skin that will hold the pulse generator.  Guide the leads through a large blood vessel into your heart and attach them to the heart muscles. Depending on the ICD, the leads may go into one ventricle or they may  go to both ventricles and into an upper chamber of the heart (atrium).  Test the ICD.  Close the incision with stitches, glue, or staples. AFTER THE PROCEDURE  You may feel pain. Some pain is normal. It may last a few days.  You may stay in a recovery area until the local anesthetic has worn off. Your blood pressure and pulse will be checked often. You will be taken to a room where your heart will be monitored.  A chest X-ray will be taken. This is done to check that the cardioverter defibrillator is in the right place.  You may stay in the hospital overnight.  A slight bump may be seen over the skin where the ICD was placed. Sometimes, it is possible to feel the ICD under the skin. This is normal.  In the months and years afterward, your caregiver will check the device, the leads, and the battery every few months. Eventually, when the battery is low, the ICD will be replaced. Document Released: 10/07/2001 Document Revised: 11/05/2012 Document Reviewed: 02/04/2012 [facility] Patient Information 2015 [place], [place]. This information is not intended to replace advice given to you by your health care provider. Make sure you discuss any questions you have with your health care provider.

## 2013-09-09 NOTE — Progress Notes (Signed)
Patient s/p ICD placement-hospitalized 09/07/13-09/08/13 Site covered by steristrips. Pain 0/10. Hx of MI, bradycardia. Patient taking meds as prescribed, but needs refills of all medications except brilinta.

## 2013-09-10 NOTE — Progress Notes (Signed)
   Subjective:    Patient ID: Christopher [name], male    DOB: May 15, 1960, 53 y.o.   MRN: [id number]  HPI Pt is 53 yo male, recently had ICD placed and comes in requesting refill on medications but explains he has no money to afford the medications. He has DM, HTN, HLD. He reports felling better, no fevers, chills, no chest pain or shortness of breath. He has had enough medicines to last him few more days butt needs refills today.   Review of Systems Constitutional: Negative for fever, chills, diaphoresis, activity change, appetite change and fatigue.  HENT: Negative for ear pain, nosebleeds, congestion, facial swelling, rhinorrhea, neck pain, neck stiffness and ear discharge.   Eyes: Negative for pain, discharge, redness, itching and visual disturbance.  Respiratory: Negative for cough, choking, chest tightness, shortness of breath, wheezing and stridor.   Cardiovascular: Negative for chest pain, palpitations and leg swelling.  Gastrointestinal: Negative for abdominal distention.  Genitourinary: Negative for dysuria, urgency, frequency, hematuria, flank pain, decreased urine volume, difficulty urinating and dyspareunia.  Musculoskeletal: Negative for back pain, joint swelling, arthralgias and gait problem.  Neurological: Negative for dizziness, tremors, seizures, syncope, facial asymmetry, speech difficulty, weakness, light-headedness, numbness and headaches.  Hematological: Negative for adenopathy. Does not bruise/bleed easily.  Psychiatric/Behavioral: Negative for hallucinations, behavioral problems, confusion, dysphoric mood, decreased concentration and agitation.   Objective:  Physical Exam   Constitutional: Appears well-developed and well-nourished. No distress.  Neck: Normal ROM. Neck supple. No JVD. No tracheal deviation. No thyromegaly.  CVS: Paced rhythm, no gallops, no carotid bruit.  Pulmonary: Effort and breath sounds normal, no stridor, rhonchi, wheezes, rales.  Abdominal: Soft.  BS +,  no distension, tenderness, rebound or guarding.   Assessment & Plan:  Recent ICD placement - pt doing well, stable - continue to follow up with cardiologist HTN - provide refill on medications - he will need BMP on next visit to ensure renal functions and electrolytes stable DM - continue current medical regimen with Brilinta, which pt receives from the manufacturer

## 2013-09-14 ENCOUNTER — Institutional Professional Consult (permissible substitution): Payer: No Typology Code available for payment source | Admitting: Internal Medicine

## 2013-09-16 ENCOUNTER — Telehealth: Payer: Self-pay | Admitting: Internal Medicine

## 2013-09-16 ENCOUNTER — Inpatient Hospital Stay: Payer: No Typology Code available for payment source

## 2013-09-16 NOTE — Telephone Encounter (Signed)
New message     Did we receive the fax from [employer] last week?

## 2013-09-17 ENCOUNTER — Ambulatory Visit (INDEPENDENT_AMBULATORY_CARE_PROVIDER_SITE_OTHER): Payer: No Typology Code available for payment source | Admitting: *Deleted

## 2013-09-17 DIAGNOSIS — I2589 Other forms of chronic ischemic heart disease: Secondary | ICD-10-CM

## 2013-09-17 DIAGNOSIS — I5022 Chronic systolic (congestive) heart failure: Secondary | ICD-10-CM

## 2013-09-17 DIAGNOSIS — I509 Heart failure, unspecified: Secondary | ICD-10-CM

## 2013-09-17 LAB — MDC_IDC_ENUM_SESS_TYPE_INCLINIC
Battery Voltage: 3.08 V
Brady Statistic RV Percent Paced: 0.01 %
HIGH POWER IMPEDANCE MEASURED VALUE: 190 Ohm
HighPow Impedance: 56 Ohm
Lead Channel Impedance Value: 475 Ohm
Lead Channel Pacing Threshold Amplitude: 0.75 V
Lead Channel Sensing Intrinsic Amplitude: 8 mV
Lead Channel Setting Pacing Amplitude: 3.5 V
Lead Channel Setting Pacing Pulse Width: 0.4 ms
Lead Channel Setting Sensing Sensitivity: 0.3 mV
MDC IDC MSMT BATTERY REMAINING LONGEVITY: 136 mo
MDC IDC MSMT LEADCHNL RV PACING THRESHOLD PULSEWIDTH: 0.4 ms
MDC IDC MSMT LEADCHNL RV SENSING INTR AMPL: 5 mV
MDC IDC SESS DTM: 20150820162530
MDC IDC SET ZONE DETECTION INTERVAL: 330 ms
Zone Setting Detection Interval: 300 ms
Zone Setting Detection Interval: 360 ms

## 2013-09-17 NOTE — Progress Notes (Signed)

## 2013-09-22 ENCOUNTER — Encounter: Payer: Self-pay | Admitting: *Deleted

## 2013-09-22 DIAGNOSIS — Z599 Problem related to housing and economic circumstances, unspecified: Secondary | ICD-10-CM

## 2013-09-22 NOTE — Progress Notes (Signed)
LCSW met with patient due to several challenges.  Patient identified that he has had several financial challenges since his heart attack in May of this year.  Patient stated that he was pursuing both ssd and unemployment for financial support.  LCSW provided patient with contact information for vocational rehabilitation and shared with patient that there are some additional resources to support with ssd application.  TPatient has an appointment in this clinic within the next week.  At which time patient is encouraged to follow up with the LCSW.   Christene Lye MSW, LCSW

## 2013-09-23 NOTE — Telephone Encounter (Signed)
No fax recieved

## 2013-10-01 ENCOUNTER — Encounter: Payer: Self-pay | Admitting: Family Medicine

## 2013-10-01 ENCOUNTER — Ambulatory Visit: Payer: No Typology Code available for payment source | Attending: Family Medicine | Admitting: Family Medicine

## 2013-10-01 VITALS — BP 99/64 | HR 73 | Temp 97.8°F | Resp 18 | Ht 68.0 in | Wt 144.4 lb

## 2013-10-01 DIAGNOSIS — I509 Heart failure, unspecified: Secondary | ICD-10-CM | POA: Insufficient documentation

## 2013-10-01 DIAGNOSIS — I252 Old myocardial infarction: Secondary | ICD-10-CM | POA: Insufficient documentation

## 2013-10-01 DIAGNOSIS — Z23 Encounter for immunization: Secondary | ICD-10-CM

## 2013-10-01 DIAGNOSIS — F17210 Nicotine dependence, cigarettes, uncomplicated: Secondary | ICD-10-CM

## 2013-10-01 DIAGNOSIS — I5022 Chronic systolic (congestive) heart failure: Secondary | ICD-10-CM

## 2013-10-01 DIAGNOSIS — Z950 Presence of cardiac pacemaker: Secondary | ICD-10-CM | POA: Diagnosis not present

## 2013-10-01 DIAGNOSIS — Z599 Problem related to housing and economic circumstances, unspecified: Secondary | ICD-10-CM

## 2013-10-01 DIAGNOSIS — Z598 Other problems related to housing and economic circumstances: Secondary | ICD-10-CM

## 2013-10-01 DIAGNOSIS — F17200 Nicotine dependence, unspecified, uncomplicated: Secondary | ICD-10-CM | POA: Insufficient documentation

## 2013-10-01 DIAGNOSIS — Z5987 Material hardship: Secondary | ICD-10-CM

## 2013-10-01 HISTORY — DX: Problem related to housing and economic circumstances, unspecified: Z59.9

## 2013-10-01 MED ORDER — NICOTINE 10 MG IN INHA: 1.0000 | RESPIRATORY_TRACT | Status: DC | PRN

## 2013-10-01 NOTE — Progress Notes (Signed)
LCSW met with patient because he was feeling distressed about current housing situations.  LCSW encouraged patient to keep seeking resources.  Patient did identify some of his personal challenges.  LCSW will look for additional resources for patient's support.  Christene Lye MSW, LCSW

## 2013-10-01 NOTE — Assessment & Plan Note (Signed)
A: chronic, compensated currently, no evidence of fluid overload, BP noted low but not dangerously so Meds: complaint P:  Patient to continue current regimen Continue cards f/u Signed for release of records from Dr. Wynonia Lawman

## 2013-10-01 NOTE — Progress Notes (Signed)
Establish Care;

## 2013-10-01 NOTE — Assessment & Plan Note (Signed)
A: desire to quit P: nicotrol inhaler

## 2013-10-01 NOTE — Patient Instructions (Addendum)
Mr. Aloi,  Thank you for coming in today. It was a pleasure meeting you. I look forward to be a primary doctor.  1. Smoking cessation support: smoking cessation hotline: 1-800-QUIT-NOW.  Smoking cessation classes are available through [facility] and [facility]. Call (310) [phone] or visit our website at [email or web address] Nicotrol inhaler instead of smoking.  2. Heart failure: no red flags. Continue current regimen and keep f/u with Dr. Wynonia Lawman. Sign consent, I will review records and provide a letter regarding your functional capacity. Request a letter from Dr. Wynonia Lawman as well.   3. Financial stress: I have placed a referral to [facility].  F/u with me in 4-6 weeks.   Dr. [name]

## 2013-10-01 NOTE — Progress Notes (Addendum)
   Subjective:    Patient ID: STEELE [name], male    DOB: 09-Apr-1960, 53 y.o.   MRN: [id number] CC: establish care, rEFCHF,  HPI 53 yo M presents to establish care and discuss the following:  1. rEFCHF: Patient had MI in April and May 2015. Following MI he developed reduced ejection fraction and congestive heart failure requiring ICD placement. Patient compliant with medication regimen. [name] compliant with cardiology Dr. physiology followup. He last saw Dr. Fidela Juneau on 08/11/13, has f/u on 11/17/13. Dr. Lovena Le is the electrophysiologist. Patient admits to chest tightness in L upper chest. He sleeps on 2 pillows. He has occasional dizziness when standing. He denies syncope, shortness of breath, lower extremity edema.  2. Smoker: Patient continues to smoke about the very difficult to quit. He has tried nicotine gum in the past without success. He is resistant to Chantix and Rebetron because of fear of having bad dreams. He like to quit smoking and he knows that he needs to for his cardiac health.  3. financial difficulties: Since his heart attack and resulting heart failure patient is having financial difficulties. 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 He is currently involved in a short sale process with his house to avoid foreclosure. He is apply for housing through the [facility]   4. HM: due for flu shot. Reluctantly agreed to take it.  Review of Systems As per HPI  GAD 7: score of 19. 3 to all questions except #6 which is 1     Objective:   Physical Exam BP 99/64  Pulse 73  Temp(Src) 97.8 F (36.6 C) (Oral)  Resp 18  Ht 5\' 8"  (1.727 m)  Wt 144 lb 6.4 oz (65.499 kg)  BMI 21.96 kg/m2  SpO2 96% General appearance: alert, cooperative and no distress , thin, white male  Neck: no carotid bruit  Lungs: clear to auscultation bilaterally Chest wall: no tenderness, ICD palpable  Heart: regular rate and rhythm, S1, S2 normal, no murmur, click,  rub or gallop Extremities: extremities normal, atraumatic, no cyanosis or edema       Assessment & Plan:

## 2013-10-01 NOTE — Assessment & Plan Note (Addendum)
A: patient has unstable housing. Currently in a house which he may lose soon. Pursuing housing with the housing authority. Stressors are contributing to symptoms of depression and anxiety  P:  Referral to SW for assistance.  F/u with me to fully assess mood and discuss initiating pharmacotherapy if symptoms are still prevalent.

## 2013-10-02 ENCOUNTER — Encounter: Payer: Self-pay | Admitting: Internal Medicine

## 2013-10-08 ENCOUNTER — Ambulatory Visit: Payer: No Typology Code available for payment source | Attending: Internal Medicine

## 2013-10-13 ENCOUNTER — Other Ambulatory Visit: Payer: Self-pay | Admitting: Internal Medicine

## 2013-10-13 DIAGNOSIS — F17210 Nicotine dependence, cigarettes, uncomplicated: Secondary | ICD-10-CM

## 2013-10-13 MED ORDER — NICOTINE 10 MG IN INHA: 1.0000 | RESPIRATORY_TRACT | Status: DC | PRN

## 2013-10-21 ENCOUNTER — Other Ambulatory Visit: Payer: No Typology Code available for payment source

## 2013-10-21 ENCOUNTER — Telehealth: Payer: Self-pay | Admitting: *Deleted

## 2013-10-21 NOTE — Telephone Encounter (Signed)
LCSW called patient in order to reschedule appointment with LCSW to address challenges with health insurance premium.  Patient will come in 10/22/13 at 10 am.  Christene Lye MSW, LCSW

## 2013-10-22 ENCOUNTER — Ambulatory Visit: Payer: No Typology Code available for payment source | Attending: Family Medicine | Admitting: *Deleted

## 2013-10-22 DIAGNOSIS — Z599 Problem related to housing and economic circumstances, unspecified: Secondary | ICD-10-CM

## 2013-10-22 NOTE — Progress Notes (Signed)
LCSW met with patient in order to support necessary resources.  LCSW initiated assistance to help with medical insurance issues.  LCSW will deliver this issue to manager. LCSW provided patient with information about help with electric bill.  LCSW connected patient with pharmacy about developing an AR account to maintain his medication.   Patient will follow as needed.  Christene Lye MSW, LCSW

## 2013-11-17 ENCOUNTER — Encounter: Payer: Self-pay | Admitting: Cardiology

## 2013-11-17 DIAGNOSIS — Z95810 Presence of automatic (implantable) cardiac defibrillator: Secondary | ICD-10-CM

## 2013-11-17 NOTE — Progress Notes (Unsigned)
Patient ID: Christopher [name], male   DOB: Dec 08, 1960, 53 y.o.   MRN: [id number]   Christopher, [name]  Date of visit:  11/17/2013 DOB:  1960-02-25    Age:  53 yrs. Medical record number:  [id number]     Account number:  [id number] ____________________________ CURRENT DIAGNOSES  1. Atherosclerotic heart disease of native coronary artery with other forms of angina pectoris  2. Old myocardial infarction  3. Chronic systolic (congestive) heart failure  4. Hyperlipidemia, unspecified  5. Presence of automatic (implantable) cardiac defibrillator  6. Other intervertebral disc degeneration, lumbar region  7. Presence of coronary angioplasty implant and graft ____________________________ ALLERGIES  Celebrex, Intolerance-unknown ____________________________ MEDICATIONS  1. aspirin 81 mg chewable tablet, 1 p.o. daily  2. BRILINTA 90 mg tablet, BID  3. atorvastatin 80 mg tablet, 1 p.o. daily  4. carvedilol 3.125 mg tablet, BID  5. digoxin 125 mcg tablet, 1 p.o. daily  6. lisinopril 2.5 mg tablet, 1 p.o. daily ____________________________ CHIEF COMPLAINTS  Followup of Atherosclerotic heart disease of native coronary artery with other forms of angina pecto  Followup of Chronic systolic (congestive) heart failure  Followup of Old myocardial infarction ____________________________ HISTORY OF PRESENT ILLNESS The patient returns for cardiac followup. He continues to have significant social and emotional issues. There are issues related to disability, chronic back pain, difficulty with housing and he states that he might be homeless later on. He is upset over the free clinic that he is going to at the present time and also over his medications. He had a defibrillator implanted by Dr. Lovena Le and is due to see him in followup in November. He currently denies angina but does have dyspnea with exertion. He has significant chronic back pain and finds it difficult to lift. He continues to smoke heavily. He is really has a  lot of trouble with anxiety and is quite difficult to assess. No definite angina. ___________________________ PAST HISTORY  Past Medical Illnesses:  hyperlipidemia, lumbar disc disease;  Cardiovascular Illnesses:  S/P MI-anterior May 2015, CAD;  Surgical Procedures:  cyst rt hand;  Cardiology Procedures-Invasive:  cardiac cath (left) May 2015, Xience Stent x 2 3.0 x28 and 3.0 x 8 mm to LAD dr. Tamala Julian 06/20/13, Medtronic AICD implant Dr. Lovena Le 09/07/13;  Cardiology Procedures-Noninvasive:  echocardiogram May 2015, echocardiogram July 2015;  Cardiac Cath Results:  normal Left main, occluded LAD, 50% stenosis CFX, occluded RCA, right to left collateral, left to right collateral, stent x 2 to LAD, IAPB;  LVEF of 20% documented via echocardiogram on 06/24/2013,   ____________________________ CARDIO-PULMONARY TEST DATES EKG Date:  07/03/2013;   Cardiac Cath Date:  06/20/2013;  Stent Placement Date: 06/20/2013;  Echocardiography Date: 08/05/2013;  Chest Xray Date: 06/20/2013;   ____________________________ FAMILY HISTORY Brother -- Brother alive and well Brother -- Brother alive and well Brother -- Brother alive and well Father -- Father dead, Pulmonary emphysema Mother -- Mother dead, CVA, Dementia/Alzheimers Sister -- Sister alive and well ____________________________ SOCIAL HISTORY Alcohol Use:  no alcohol use;  Smoking:  smokes, greater than 50 pack year history;  Lifestyle:  single;  Exercise:  no regular exercise;  Occupation:  unemployed;  Residence:  lives alone;   ____________________________ REVIEW OF SYSTEMS General:  denies recent weight change, fatique or change in exercise tolerance. Eyes: denies diplopia, history of glaucoma or visual problems. Respiratory: mild dyspnea with exertion Cardiovascular:  please review HPI  Genitourinary-Male: no dysuria, urgency, frequency, or nocturia  Musculoskeletal:  chronic back pain Psychiatric:  anxiety, situational  stress   ____________________________ PHYSICAL EXAMINATION VITAL SIGNS  Blood Pressure:  104/60 Sitting, Left arm, regular cuff  , 108/66 Standing, Left arm and regular cuff   Pulse:  76/min. Weight:  145.00 lbs. Height:  68"BMI: 22  Constitutional:  anxious talkative white male in no acute distress Skin:  warm and dry to touch, no apparent skin lesions, or masses noted. Head:  normocephalic, normal hair pattern, no masses or tenderness Neck:  supple, without massess. No JVD, thyromegaly or carotid bruits. Carotid upstroke normal. Chest:  normal symmetry, clear to auscultation. healed ICD incision in left upper chest Cardiac:  regular rhythm, normal S1 and S2, No S3 or S4, no murmurs, gallops or rubs detected Peripheral Pulses:  the femoral,dorsalis pedis, and posterior tibial pulses are full and equal bilaterally with no bruits auscultated. Extremities & Back:  no deformities, clubbing, cyanosis, erythema or edema observed. Normal muscle strength and tone. Neurological:  no gross motor or sensory deficits noted, affect appropriate, oriented x3. ____________________________ MOST RECENT LIPID PANEL 06/22/13  CHOL TOTL 151 mg/dl, LDL 85 NM, HDL 41 mg/dl, TRIGLYCER 126 mg/dl and CHOL/HDL 3.7 (Calc) ____________________________ IMPRESSIONS/PLAN 1. Coronary artery disease with previous anterior myocardial infarction with no angina 2. Chronic systolic heart failure currently compensated 3. Hyperlipidemia under treatment 4. Functioning implantable defibrillator 5. Significant anxiety and situational stress which is ongoing  Recommendations:  Clinically very difficult to assess. He has poor cardiac reserve and I talked to him about getting involved in rehabilitation but he has not been able to get involved in cardiac rehabilitation at this point. Followup in 3 months.  ____________________________ TODAYS ORDERS  1. Return Visit: 3 months                        ____________________________ Cardiology Physician:  Kerry Hough MD [facility]

## 2013-12-23 ENCOUNTER — Encounter: Payer: Self-pay | Admitting: Internal Medicine

## 2013-12-23 ENCOUNTER — Ambulatory Visit (INDEPENDENT_AMBULATORY_CARE_PROVIDER_SITE_OTHER): Payer: No Typology Code available for payment source | Admitting: Internal Medicine

## 2013-12-23 VITALS — BP 98/70 | HR 76 | Ht 68.0 in | Wt 146.8 lb

## 2013-12-23 DIAGNOSIS — Z95810 Presence of automatic (implantable) cardiac defibrillator: Secondary | ICD-10-CM | POA: Diagnosis not present

## 2013-12-23 DIAGNOSIS — I5022 Chronic systolic (congestive) heart failure: Secondary | ICD-10-CM | POA: Diagnosis not present

## 2013-12-23 DIAGNOSIS — F17210 Nicotine dependence, cigarettes, uncomplicated: Secondary | ICD-10-CM

## 2013-12-23 LAB — MDC_IDC_ENUM_SESS_TYPE_INCLINIC
Battery Remaining Longevity: 135 mo
Battery Voltage: 3.06 V
Brady Statistic RV Percent Paced: 0.01 %
HIGH POWER IMPEDANCE MEASURED VALUE: 190 Ohm
HIGH POWER IMPEDANCE MEASURED VALUE: 65 Ohm
Lead Channel Impedance Value: 513 Ohm
Lead Channel Pacing Threshold Amplitude: 0.5 V
Lead Channel Sensing Intrinsic Amplitude: 11.5 mV
Lead Channel Sensing Intrinsic Amplitude: 7.75 mV
Lead Channel Setting Pacing Amplitude: 2 V
Lead Channel Setting Pacing Pulse Width: 0.4 ms
Lead Channel Setting Sensing Sensitivity: 0.3 mV
MDC IDC MSMT LEADCHNL RV PACING THRESHOLD PULSEWIDTH: 0.4 ms
MDC IDC SESS DTM: 20151125082344
Zone Setting Detection Interval: 300 ms
Zone Setting Detection Interval: 330 ms
Zone Setting Detection Interval: 360 ms

## 2013-12-23 NOTE — Assessment & Plan Note (Signed)
His symptoms appear to be well compensated. He will continue his current medical therapy. He is encouraged to increase his physical activity and maintain a low-sodium diet.

## 2013-12-23 NOTE — Progress Notes (Signed)
HPI [name] returns today for ICD followup.  He is a pleasant 53 yo man with an ICM, chronic systolic heart failure, class 2, who sustained an MI 6 months ago and under ICD implant 3 months ago. Since his ICD implant, he has been stable. His has minimal soreness and lacks energy. His heart failure symptoms appear to be class 2B. No edema. No ICD shock Allergies  Allergen Reactions  . Celebrex [Celecoxib] Other (See Comments)    Caused his back to burn     Current Outpatient Prescriptions  Medication Sig Dispense Refill  . aspirin 81 MG chewable tablet Chew 1 tablet (81 mg total) by mouth daily. 30 tablet 5  . atorvastatin (LIPITOR) 80 MG tablet Take 1 tablet (80 mg total) by mouth daily at 6 PM. 30 tablet 5  . carvedilol (COREG) 3.125 MG tablet Take 1 tablet (3.125 mg total) by mouth 2 (two) times daily with a meal. 60 tablet 5  . digoxin (LANOXIN) 0.125 MG tablet Take 1 tablet (0.125 mg total) by mouth daily. 30 tablet 5  . lisinopril (PRINIVIL,ZESTRIL) 2.5 MG tablet Take 1 tablet (2.5 mg total) by mouth daily. 30 tablet 5  . nicotine (NICOTROL) 10 MG inhaler Inhale 1 cartridge (1 continuous puffing total) into the lungs as needed for smoking cessation. 126 each 3  . ticagrelor (BRILINTA) 90 MG TABS tablet Take 1 tablet (90 mg total) by mouth 2 (two) times daily. 60 tablet 10   No current facility-administered medications for this visit.     Past Medical History  Diagnosis Date  . Automatic implantable cardioverter-defibrillator in situ   . CHF (congestive heart failure) 05/2013   . Myocardial infarction 04/29/2013  . CAD (coronary artery disease)   . Heavy smoker   . Hyperlipidemia     ROS:   All systems reviewed and negative except as noted in the HPI.   Past Surgical History  Procedure Laterality Date  . Cardiac catheterization  05/2013   . Coronary angioplasty  05/2013   . Icd placement  09/07/2013      Family History  Problem Relation Age of Onset  .  Emphysema Father   . Heart disease Mother   . Cancer Neg Hx      History   Social History  . Marital Status: Single    Spouse Name: N/A    Number of Children: 0  . Years of Education: 12   Occupational History  . Unemployed     Social History Main Topics  . Smoking status: Current Every Day Smoker -- 1.50 packs/day for 30 years    Types: Cigarettes  . Smokeless tobacco: Never Used  . Alcohol Use: No     Comment: quit in 08/2013   . Drug Use: No  . Sexual Activity: Not Currently    Birth Control/ Protection: Condom     Comment: men    Other Topics Concern  . Not on file   Social History Narrative   Lives alone.    In a house trying to sell his home.      BP 98/70 mmHg  Pulse 76  Ht 5\' 8"  (1.727 m)  Wt 146 lb 12.8 oz (66.588 kg)  BMI 22.33 kg/m2  Physical Exam:  stable appearing middle aged man, NAD HEENT: Unremarkable Neck:  7 cm JVD, no thyromegally Back:  No CVA tenderness Lungs:  Clear except for basilar rales. Well healed ICD incision HEART:  Regular rate rhythm, no  murmurs, no rubs, no clicks Abd:  soft, positive bowel sounds, no organomegally, no rebound, no guarding Ext:  2 plus pulses, no edema, no cyanosis, no clubbing Skin:  No rashes no nodules Neuro:  CN II through XII intact, motor grossly intact    Assess/Plan:

## 2013-12-23 NOTE — Assessment & Plan Note (Signed)
He is trying to stop smoking. He is down to half pack of cigarettes a day.

## 2013-12-23 NOTE — Patient Instructions (Signed)
Your physician wants you to follow-up in: 9 months with Dr. Lovena Le. You will receive a reminder letter in the mail two months in advance. If you don't receive a letter, please call our office to schedule the follow-up appointment.  Your physician recommends that you continue on your current medications as directed. Please refer to the Current Medication list given to you today.

## 2013-12-23 NOTE — Assessment & Plan Note (Signed)
His Medtronic single-chamber ICD is working normally. We'll plan to recheck in several months. 

## 2014-01-07 ENCOUNTER — Encounter (HOSPITAL_COMMUNITY): Payer: Self-pay | Admitting: Interventional Cardiology

## 2014-02-22 ENCOUNTER — Ambulatory Visit (INDEPENDENT_AMBULATORY_CARE_PROVIDER_SITE_OTHER): Payer: 59 | Admitting: Physician Assistant

## 2014-02-22 ENCOUNTER — Encounter: Payer: Self-pay | Admitting: Physician Assistant

## 2014-02-22 VITALS — BP 106/70 | HR 70 | Temp 98.2°F | Resp 16 | Ht 68.5 in | Wt 147.0 lb

## 2014-02-22 DIAGNOSIS — I2510 Atherosclerotic heart disease of native coronary artery without angina pectoris: Secondary | ICD-10-CM

## 2014-02-22 NOTE — Progress Notes (Signed)
Subjective:    Patient ID: Christopher [name], male    DOB: 10/17/1960, 54 y.o.   MRN: [id number]  PCP: Minerva Ends, MD  Chief Complaint  Patient presents with  . estab care  . cardio referral   Patient Active Problem List   Diagnosis Date Noted  . AICD (automatic cardioverter/defibrillator) present   . Financial difficulties 10/01/2013  . Other specified forms of chronic ischemic heart disease 09/07/2013  . CAD (coronary artery disease), native coronary artery 07/03/2013  . Chronic systolic CHF (congestive heart failure)   . Hyperlipidemia   . Heavy smoker (more than 20 cigarettes per day) 06/21/2013  . Old anterior myocardial infarction 06/20/2013   Prior to Admission medications   Medication Sig Start Date End Date Taking? Authorizing Provider  aspirin 81 MG chewable tablet Chew 1 tablet (81 mg total) by mouth daily. 09/09/13  Yes Theodis Blaze, MD  atorvastatin (LIPITOR) 80 MG tablet Take 1 tablet (80 mg total) by mouth daily at 6 PM. 09/09/13  Yes Theodis Blaze, MD  carvedilol (COREG) 3.125 MG tablet Take 1 tablet (3.125 mg total) by mouth 2 (two) times daily with a meal. 09/09/13  Yes Theodis Blaze, MD  digoxin (LANOXIN) 0.125 MG tablet Take 1 tablet (0.125 mg total) by mouth daily. 09/09/13  Yes Theodis Blaze, MD  lisinopril (PRINIVIL,ZESTRIL) 2.5 MG tablet Take 1 tablet (2.5 mg total) by mouth daily. 09/09/13  Yes Theodis Blaze, MD  nicotine (NICOTROL) 10 MG inhaler Inhale 1 cartridge (1 continuous puffing total) into the lungs as needed for smoking cessation. 10/13/13  Yes Tresa Garter, MD  ticagrelor (BRILINTA) 90 MG TABS tablet Take 1 tablet (90 mg total) by mouth 2 (two) times daily. 06/25/13  Yes Brett Canales, PA-C   Medications, allergies, past medical history, surgical history, family history, social history and problem list reviewed and updated.  HPI  54 yom with pmh cad presents today to establish pcp for referral to continue to see cardiology.  Had MI  5/15. Cath at that time showed 100% LAD, RCA. 50% circ. Had DES to LAD. Had eco 7/15 EF=30%. Had ICD placed 8/15. He continues to follow with Dr Wynonia Lawman for his heart care. He had insurance change recently and he is now required to have pcp referral which is why he's here today.   His sx prior to his MI in May were mostly left upper back pain. He also had bilateral arm tightness. Both of these were both exertional and nonexertional. These sx had been intermittent for months prior to the event. He did have a few episodes of left sided cp as well.   He has insurance but has had lot of financial difficulty past few yrs.   States he used to drink a lot of vodka every weekend. No specific amount. He has not drank in approx 6 months. Continues to smoke 1.5 ppd but is actively trying to quit with nicotrol.   Has hx mild lumbar disc bulges per pt. Has seen ortho in past who worked this up and was told he had mild DJD in lumbar spine. They referred him to chiropractic. He went several times but did not get much relief. Has not gone back due to finances.   Declined lab testing or colonoscopy referral today due to finances.   Review of Systems No cp, sob, presyncope, syncope, palps, ever, chills, upper back pain, arm tightness.     Objective:   Physical Exam  Constitutional: He is oriented to person, place, and time. He appears well-developed and well-nourished.  Non-toxic appearance. He does not have a sickly appearance. He does not appear ill. No distress.  BP 106/70 mmHg  Pulse 70  Temp(Src) 98.2 F (36.8 C) (Oral)  Resp 16  Ht 5' 8.5" (1.74 m)  Wt 147 lb (66.679 kg)  BMI 22.02 kg/m2  SpO2 99%   Eyes: Conjunctivae and EOM are normal. Pupils are equal, round, and reactive to light.  Neck: Carotid bruit is not present.  Cardiovascular: Normal rate, regular rhythm and normal heart sounds.  Exam reveals no gallop.   No murmur heard. No LE edema.   Pulmonary/Chest: Effort normal and breath sounds  normal. He has no decreased breath sounds. He has no wheezes. He has no rhonchi. He has no rales.  Neurological: He is alert and oriented to person, place, and time.      Assessment & Plan:   54 yom with pmh cad presents today to establish pcp for referral to continue to see cardiology.  Coronary artery disease involving native coronary artery of native heart without angina pectoris --referral sent to see his cardiologist, this is already scheduled for tomorrow am --rtc 3 months --> likely cmp (hx alcohol abuse), cbc (hx anemia), a1c (last a1c 6.2%), colonoscopy referral at that time as pt declined today due to [place], PA-C Physician Assistant-Certified [facility]  02/22/2014 1:57 PM

## 2014-02-22 NOTE — Progress Notes (Signed)
   Subjective:    Patient ID: Christopher [name], male    DOB: 1960/05/27, 54 y.o.   MRN: [id number]  HPI    Review of Systems  Constitutional: Negative.   HENT: Negative.   Eyes: Negative.   Respiratory: Negative.   Cardiovascular: Negative.   Gastrointestinal: Negative.   Endocrine: Negative.   Genitourinary: Negative.   Musculoskeletal: Negative.   Skin: Negative.   Allergic/Immunologic: Negative.   Neurological: Negative.   Hematological: Negative.   Psychiatric/Behavioral: Negative.        Objective:   Physical Exam        Assessment & Plan:

## 2014-02-22 NOTE — Patient Instructions (Signed)
Please come back to see [place] in 3 months so we can draw the labs we discussed today and also to discuss referral for a colonoscopy.  You will see your heart doctor tomorrow at 945 am.  For the spasm along your shoulder blade, heat/light range of motion/self massage will all help.

## 2014-09-14 ENCOUNTER — Encounter: Payer: Self-pay | Admitting: Pharmacist

## 2014-09-28 ENCOUNTER — Encounter: Payer: Self-pay | Admitting: Internal Medicine

## 2014-09-28 ENCOUNTER — Encounter: Payer: Self-pay | Admitting: *Deleted

## 2014-09-28 ENCOUNTER — Ambulatory Visit (INDEPENDENT_AMBULATORY_CARE_PROVIDER_SITE_OTHER): Payer: 59 | Admitting: Internal Medicine

## 2014-09-28 VITALS — BP 102/70 | HR 81 | Ht 68.0 in | Wt 148.2 lb

## 2014-09-28 DIAGNOSIS — I5022 Chronic systolic (congestive) heart failure: Secondary | ICD-10-CM

## 2014-09-28 DIAGNOSIS — F17210 Nicotine dependence, cigarettes, uncomplicated: Secondary | ICD-10-CM

## 2014-09-28 DIAGNOSIS — Z95810 Presence of automatic (implantable) cardiac defibrillator: Secondary | ICD-10-CM

## 2014-09-28 LAB — CUP PACEART INCLINIC DEVICE CHECK
HIGH POWER IMPEDANCE MEASURED VALUE: 190 Ohm
HighPow Impedance: 65 Ohm
Lead Channel Pacing Threshold Amplitude: 0.5 V
Lead Channel Pacing Threshold Pulse Width: 0.4 ms
Lead Channel Sensing Intrinsic Amplitude: 12 mV
Lead Channel Setting Pacing Amplitude: 2 V
MDC IDC MSMT BATTERY REMAINING LONGEVITY: 131 mo
MDC IDC MSMT BATTERY VOLTAGE: 3.01 V
MDC IDC MSMT LEADCHNL RV IMPEDANCE VALUE: 513 Ohm
MDC IDC SESS DTM: 20160830112615
MDC IDC SET LEADCHNL RV PACING PULSEWIDTH: 0.4 ms
MDC IDC SET LEADCHNL RV SENSING SENSITIVITY: 0.3 mV
MDC IDC SET ZONE DETECTION INTERVAL: 330 ms
MDC IDC STAT BRADY RV PERCENT PACED: 0.01 %
Zone Setting Detection Interval: 300 ms
Zone Setting Detection Interval: 360 ms

## 2014-09-28 NOTE — Patient Instructions (Signed)
Medication Instructions:  Your physician recommends that you continue on your current medications as directed. Please refer to the Current Medication list given to you today.   Labwork: None ordred  Testing/Procedures: None ordered  Follow-Up: Your physician wants you to follow-up in: 12 months with Dr Knox Saliva will receive a reminder letter in the mail two months in advance. If you don't receive a letter, please call our office to schedule the follow-up appointment.   Any Other Special Instructions Will Be Listed Below (If Applicable).

## 2014-09-28 NOTE — Assessment & Plan Note (Signed)
His symptoms are well compensated. He will continue his current meds.
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
## 2014-09-28 NOTE — Assessment & Plan Note (Signed)
He has been encouraged to stop smoking. He stopped for a week but then started back. Will follow.

## 2014-09-28 NOTE — Assessment & Plan Note (Signed)
His ICD is working normally. Will recheck in several months.  

## 2014-09-29 NOTE — Addendum Note (Signed)
Addended by: Freada Bergeron on: 09/29/2014 05:52 PM   Modules accepted: Orders

## 2014-12-22 ENCOUNTER — Encounter (HOSPITAL_COMMUNITY): Payer: Self-pay

## 2014-12-22 ENCOUNTER — Emergency Department (HOSPITAL_COMMUNITY): Payer: 59

## 2014-12-22 ENCOUNTER — Observation Stay (HOSPITAL_BASED_OUTPATIENT_CLINIC_OR_DEPARTMENT_OTHER)
Admission: EM | Admit: 2014-12-22 | Discharge: 2014-12-24 | Disposition: A | Discharge status: Home / Self Care | Payer: 59 | Source: Home / Self Care | Attending: Emergency Medicine | Admitting: Emergency Medicine

## 2014-12-22 DIAGNOSIS — I5022 Chronic systolic (congestive) heart failure: Secondary | ICD-10-CM | POA: Diagnosis present

## 2014-12-22 DIAGNOSIS — I2510 Atherosclerotic heart disease of native coronary artery without angina pectoris: Secondary | ICD-10-CM | POA: Diagnosis present

## 2014-12-22 DIAGNOSIS — F17210 Nicotine dependence, cigarettes, uncomplicated: Secondary | ICD-10-CM | POA: Diagnosis present

## 2014-12-22 DIAGNOSIS — I25110 Atherosclerotic heart disease of native coronary artery with unstable angina pectoris: Secondary | ICD-10-CM | POA: Diagnosis not present

## 2014-12-22 DIAGNOSIS — R079 Chest pain, unspecified: Secondary | ICD-10-CM | POA: Diagnosis present

## 2014-12-22 DIAGNOSIS — Z95810 Presence of automatic (implantable) cardiac defibrillator: Secondary | ICD-10-CM | POA: Diagnosis present

## 2014-12-22 DIAGNOSIS — E785 Hyperlipidemia, unspecified: Secondary | ICD-10-CM | POA: Diagnosis present

## 2014-12-22 LAB — BASIC METABOLIC PANEL
ANION GAP: 10 (ref 5–15)
BUN: 18 mg/dL (ref 6–20)
CHLORIDE: 105 mmol/L (ref 101–111)
CO2: 22 mmol/L (ref 22–32)
Calcium: 9.6 mg/dL (ref 8.9–10.3)
Creatinine, Ser: 1.2 mg/dL (ref 0.61–1.24)
GFR calc Af Amer: >60 mL/min (ref >60)
GLUCOSE: 103 mg/dL — AB (ref 65–99)
POTASSIUM: 5 mmol/L (ref 3.5–5.1)
Sodium: 137 mmol/L (ref 135–145)

## 2014-12-22 LAB — CBC
HCT: 49.9 % (ref 39.0–52.0)
HEMATOCRIT: 48.2 % (ref 39.0–52.0)
HEMOGLOBIN: 16.6 g/dL (ref 13.0–17.0)
HEMOGLOBIN: 17.3 g/dL — AB (ref 13.0–17.0)
MCH: 32 pg (ref 26.0–34.0)
MCH: 32.4 pg (ref 26.0–34.0)
MCHC: 34.4 g/dL (ref 30.0–36.0)
MCHC: 34.7 g/dL (ref 30.0–36.0)
MCV: 92.9 fL (ref 78.0–100.0)
MCV: 93.4 fL (ref 78.0–100.0)
PLATELETS: 110 10*3/uL — AB (ref 150–400)
Platelets: 106 10*3/uL — ABNORMAL LOW (ref 150–400)
RBC: 5.19 MIL/uL (ref 4.22–5.81)
RBC: 5.34 MIL/uL (ref 4.22–5.81)
RDW: 14.3 % (ref 11.5–15.5)
RDW: 14.3 % (ref 11.5–15.5)
WBC: 8.5 10*3/uL (ref 4.0–10.5)
WBC: 8.4 10*3/uL (ref 4.0–10.5)

## 2014-12-22 LAB — I-STAT TROPONIN, ED: Troponin i, poc: 0.03 ng/mL (ref 0.00–0.08)

## 2014-12-22 LAB — CREATININE, SERUM
CREATININE: 1.26 mg/dL — AB (ref 0.61–1.24)
GFR calc Af Amer: >60 mL/min (ref >60)

## 2014-12-22 LAB — TROPONIN I: TROPONIN I: 0.03 ng/mL (ref <0.031)

## 2014-12-22 MED ORDER — CARVEDILOL 3.125 MG PO TABS
3.1250 mg | ORAL_TABLET | Freq: Two times a day (BID) | ORAL | Status: DC
  Administered 2014-12-23 – 2014-12-24 (×3): 3.125 mg via ORAL
  Filled 2014-12-22 (×3): qty 1

## 2014-12-22 MED ORDER — INFLUENZA VAC SPLIT QUAD 0.5 ML IM SUSY
0.5000 mL | PREFILLED_SYRINGE | INTRAMUSCULAR | Status: AC
  Administered 2014-12-23: 0.5 mL via INTRAMUSCULAR
  Filled 2014-12-22: qty 0.5

## 2014-12-22 MED ORDER — NICOTINE 21 MG/24HR TD PT24
21.0000 mg | MEDICATED_PATCH | Freq: Every day | TRANSDERMAL | Status: DC
  Administered 2014-12-22 – 2014-12-24 (×3): 21 mg via TRANSDERMAL
  Filled 2014-12-22 (×3): qty 1

## 2014-12-22 MED ORDER — MORPHINE SULFATE (PF) 2 MG/ML IV SOLN: 2.0000 mg | INTRAVENOUS | Status: DC | PRN

## 2014-12-22 MED ORDER — ONDANSETRON HCL 4 MG/2ML IJ SOLN: 4.0000 mg | Freq: Four times a day (QID) | INTRAMUSCULAR | Status: DC | PRN

## 2014-12-22 MED ORDER — ATORVASTATIN CALCIUM 80 MG PO TABS
80.0000 mg | ORAL_TABLET | Freq: Every day | ORAL | Status: DC
  Administered 2014-12-23: 80 mg via ORAL
  Filled 2014-12-22: qty 1

## 2014-12-22 MED ORDER — ASPIRIN 81 MG PO CHEW
81.0000 mg | CHEWABLE_TABLET | Freq: Every day | ORAL | Status: DC
  Administered 2014-12-23 – 2014-12-24 (×2): 81 mg via ORAL
  Filled 2014-12-22 (×2): qty 1

## 2014-12-22 MED ORDER — DIGOXIN 125 MCG PO TABS
0.1250 mg | ORAL_TABLET | Freq: Every day | ORAL | Status: DC
  Administered 2014-12-23 – 2014-12-24 (×2): 0.125 mg via ORAL
  Filled 2014-12-22 (×2): qty 1

## 2014-12-22 MED ORDER — ACETAMINOPHEN 325 MG PO TABS: 650.0000 mg | ORAL_TABLET | ORAL | Status: DC | PRN

## 2014-12-22 MED ORDER — GI COCKTAIL ~~LOC~~
30.0000 mL | Freq: Four times a day (QID) | ORAL | Status: DC | PRN
  Administered 2014-12-23: 30 mL via ORAL
  Filled 2014-12-22: qty 30

## 2014-12-22 MED ORDER — IPRATROPIUM-ALBUTEROL 0.5-2.5 (3) MG/3ML IN SOLN: 3.0000 mL | Freq: Four times a day (QID) | RESPIRATORY_TRACT | Status: DC | PRN

## 2014-12-22 MED ORDER — CLOPIDOGREL BISULFATE 75 MG PO TABS
75.0000 mg | ORAL_TABLET | Freq: Every day | ORAL | Status: DC
  Administered 2014-12-23 – 2014-12-24 (×2): 75 mg via ORAL
  Filled 2014-12-22 (×2): qty 1

## 2014-12-22 MED ORDER — ENOXAPARIN SODIUM 40 MG/0.4ML ~~LOC~~ SOLN
40.0000 mg | SUBCUTANEOUS | Status: DC
  Administered 2014-12-23: 40 mg via SUBCUTANEOUS
  Filled 2014-12-22 (×2): qty 0.4

## 2014-12-22 MED ORDER — LISINOPRIL 2.5 MG PO TABS
2.5000 mg | ORAL_TABLET | Freq: Every day | ORAL | Status: DC
  Administered 2014-12-23 – 2014-12-24 (×2): 2.5 mg via ORAL
  Filled 2014-12-22 (×2): qty 1
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
## 2014-12-22 NOTE — ED Provider Notes (Signed)
CSN: [id number]     Arrival date & time 12/22/14  1609 History   First MD Initiated Contact with Patient 12/22/14 1613     Chief Complaint  Patient presents with  . Chest Pain     (Consider location/radiation/quality/duration/timing/severity/associated sxs/prior Treatment) HPI Comments: 54 y.o. Male with history of MI, ischemic CM with AICD placement presents for chest pressure and pain.  Patient reports that over the last 1 month he has had episodes of tightness and pressure across his upper chest and into his bilateral shoulders.  He reports that this is the same pain/discomfort that he was having before his massive MI.  He says that over the last week the discomfort has been more frequent and that he is not able to exert himself without development of the discomfort and that it is coming on with less and less exertion even just with trying to dye his hair today.  He called his cardiologist's office and they instructed him to take 324 mg of asa and come to the ER which he did.  HE does also report lots of stress and anxiety and says he cannot tell if this is part of what is causing him to feel this way.   Past Medical History  Diagnosis Date  . Automatic implantable cardioverter-defibrillator in situ   . CHF (congestive heart failure) ([place]) 05/2013   . Myocardial infarction ([place]) 04/29/2013  . CAD (coronary artery disease)   . Heavy smoker   . Hyperlipidemia   . COPD (chronic obstructive pulmonary disease) ([place])   . Chronic kidney disease    Past Surgical History  Procedure Laterality Date  . Cardiac catheterization  05/2013   . Coronary angioplasty  05/2013   . Icd placement  09/07/2013   . Left heart catheterization with coronary angiogram N/A 06/20/2013    Procedure: LEFT HEART CATHETERIZATION WITH CORONARY ANGIOGRAM;  Surgeon: Sinclair Grooms, MD;  Location: [facility] CATH LAB;  Service: Cardiovascular;  Laterality: N/A;  . Percutaneous coronary stent intervention (pci-s)  06/20/2013     Procedure: PERCUTANEOUS CORONARY STENT INTERVENTION (PCI-S);  Surgeon: Sinclair Grooms, MD;  Location: [facility] CATH LAB;  Service: Cardiovascular;;  . Intra-aortic balloon pump insertion  06/20/2013    Procedure: INTRA-AORTIC BALLOON PUMP INSERTION;  Surgeon: Sinclair Grooms, MD;  Location: [facility] CATH LAB;  Service: Cardiovascular;;  . Implantable cardioverter defibrillator implant N/A 09/07/2013    Procedure: IMPLANTABLE CARDIOVERTER DEFIBRILLATOR IMPLANT;  Surgeon: [name], MD;  Location: [facility] CATH LAB;  Service: Cardiovascular;  Laterality: N/A;   Family History  Problem Relation Age of Onset  . Emphysema Father   . Heart disease Mother   . Cancer Neg Hx   . Dementia Mother   . Diabetes Mother    Social History  Substance Use Topics  . Smoking status: Current Every Day Smoker -- 1.50 packs/day for 30 years    Types: Cigarettes  . Smokeless tobacco: Never Used  . Alcohol Use: No     Comment: quit in 08/2013     Review of Systems  Constitutional: Negative for fever, chills, appetite change and fatigue.  HENT: Negative for congestion, ear pain and postnasal drip.   Respiratory: Negative for cough, chest tightness, shortness of breath and wheezing.   Cardiovascular: Positive for chest pain. Negative for palpitations and leg swelling.  Gastrointestinal: Negative for nausea, vomiting, abdominal pain, diarrhea and constipation.  Genitourinary: Negative for dysuria, hematuria and decreased urine volume.  Musculoskeletal: Negative for myalgias and  back pain.  Neurological: Negative for dizziness, seizures, weakness and headaches.  Hematological: Does not bruise/bleed easily.  Psychiatric/Behavioral: The patient is nervous/anxious.       Allergies  Celebrex  Home Medications   Prior to Admission medications   Medication Sig Start Date End Date Taking? Authorizing Provider  aspirin 81 MG chewable tablet Chew 1 tablet (81 mg total) by mouth daily. 09/09/13  Yes Theodis Blaze, MD   atorvastatin (LIPITOR) 80 MG tablet Take 1 tablet (80 mg total) by mouth daily at 6 PM. 09/09/13  Yes Theodis Blaze, MD  carvedilol (COREG) 3.125 MG tablet Take 1 tablet (3.125 mg total) by mouth 2 (two) times daily with a meal. 09/09/13  Yes Theodis Blaze, MD  clopidogrel (PLAVIX) 75 MG tablet Take 75 mg by mouth daily. 07/24/14  Yes Historical Provider, MD  digoxin (LANOXIN) 0.125 MG tablet Take 1 tablet (0.125 mg total) by mouth daily. 09/09/13  Yes Theodis Blaze, MD  lisinopril (PRINIVIL,ZESTRIL) 2.5 MG tablet Take 1 tablet (2.5 mg total) by mouth daily. 09/09/13  Yes Theodis Blaze, MD  nicotine (NICOTROL) 10 MG inhaler Inhale 1 cartridge (1 continuous puffing total) into the lungs as needed for smoking cessation. Patient not taking: Reported on 12/22/2014 10/13/13   Tresa Garter, MD   BP 99/69 mmHg  Pulse 82  Temp(Src) 97.6 F (36.4 C) (Oral)  Resp 18  Ht 5\' 8"  (1.727 m)  Wt 141 lb (63.957 kg)  BMI 21.44 kg/m2  SpO2 97% Physical Exam  Constitutional: He is oriented to person, place, and time. He appears well-developed and well-nourished. No distress.  HENT:  Head: Normocephalic and atraumatic.  Right Ear: External ear normal.  Left Ear: External ear normal.  Mouth/Throat: Oropharynx is clear and moist. No oropharyngeal exudate.  Eyes: EOM are normal. Pupils are equal, round, and reactive to light.  Neck: Normal range of motion. Neck supple.  Cardiovascular: Normal rate, regular rhythm, normal heart sounds and intact distal pulses.   No murmur heard. Pulmonary/Chest: Effort normal. No respiratory distress. He has no wheezes. He has no rales.  Abdominal: Soft. He exhibits no distension. There is no tenderness.  Musculoskeletal: He exhibits no edema.  Neurological: He is alert and oriented to person, place, and time.  Skin: Skin is warm and dry. No rash noted. He is not diaphoretic.  Psychiatric: His mood appears anxious.  Vitals reviewed.   ED Course  Procedures (including  critical care time) Labs Review Labs Reviewed  BASIC METABOLIC PANEL - Abnormal; Notable for the following:    Glucose, Bld 103 (*)    All other components within normal limits  CBC - Abnormal; Notable for the following:    Platelets 106 (*)    All other components within normal limits  CBC - Abnormal; Notable for the following:    Hemoglobin 17.3 (*)    Platelets 110 (*)    All other components within normal limits  CREATININE, SERUM - Abnormal; Notable for the following:    Creatinine, Ser 1.26 (*)    All other components within normal limits  COMPREHENSIVE METABOLIC PANEL - Abnormal; Notable for the following:    BUN 21 (*)    Creatinine, Ser 1.32 (*)    All other components within normal limits  LIPID PANEL - Abnormal; Notable for the following:    HDL 31 (*)    All other components within normal limits  TROPONIN I  TROPONIN I  TROPONIN I  I-STAT TROPOININ,  ED    Imaging Review Dg Chest 2 View  12/22/2014  CLINICAL DATA:  Chest pain. EXAM: CHEST  2 VIEW COMPARISON:  September 08, 2013. FINDINGS: The heart size and mediastinal contours are within normal limits. Both lungs are clear. No pneumothorax or pleural effusion is noted. Left-sided pacemaker is unchanged in position. The visualized skeletal structures are unremarkable. IMPRESSION: No active cardiopulmonary disease. Electronically Signed   By: Marijo Conception, M.D.   On: 12/22/2014 18:03   I have personally reviewed and evaluated these images and lab results as part of my medical decision-making.   EKG Interpretation   Date/Time:  Wednesday December 22 2014 16:14:33 EST Ventricular Rate:  65 PR Interval:  190 QRS Duration: 113 QT Interval:  368 QTC Calculation: 383 R Axis:   95 Text Interpretation:  Sinus rhythm Inferior infarct, age indeterminate  Abnormal lateral Q waves Anterior infarct, old No significant change since  last tracing Confirmed by Lonia Skinner ([id number]) on 12/22/2014 4:18:35 PM      MDM   Patient seen and evaluated in stable condition.  No active symptoms at rest.  Patient already taken 324 mg ASA.  EKG unremarkable.  Labs and xray unremarkable. ICD interrogation unremarkable. Discussed with cardiology who agreed that patient should be admitted for chest pain rule out and likely stress test or more provacative cardiac testing.  He said he would see the patient in consult and asked the hospitalist be called for admission.  Discussed with Dr. Daryll Drown from [facility] who agreed with admission and patient was admitted under the care of Dr. Daryll Drown to telemetry. Final diagnoses:  Chest pain, unspecified chest pain type    1. Chest pain, angina    Harvel Quale, MD 12/23/14 1007
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
## 2014-12-22 NOTE — ED Notes (Signed)
Pt reports diffuse intermittent chest pain that has been present x1 month.  Pt has a cardiac and anxiety hx.  Pt reports he has been under a great deal of stress lately and noticed that the pain has increased over the past week.  Pt denies any other symptoms.  Pt took 324mg  ASA PTA and had a pacemaker placed in 2015.

## 2014-12-23 DIAGNOSIS — R079 Chest pain, unspecified: Secondary | ICD-10-CM | POA: Diagnosis not present

## 2014-12-23 DIAGNOSIS — I5022 Chronic systolic (congestive) heart failure: Secondary | ICD-10-CM

## 2014-12-23 DIAGNOSIS — Z95810 Presence of automatic (implantable) cardiac defibrillator: Secondary | ICD-10-CM | POA: Diagnosis not present

## 2014-12-23 DIAGNOSIS — I25110 Atherosclerotic heart disease of native coronary artery with unstable angina pectoris: Secondary | ICD-10-CM | POA: Diagnosis not present

## 2014-12-23 DIAGNOSIS — E785 Hyperlipidemia, unspecified: Secondary | ICD-10-CM

## 2014-12-23 DIAGNOSIS — F17210 Nicotine dependence, cigarettes, uncomplicated: Secondary | ICD-10-CM

## 2014-12-23 LAB — TROPONIN I
Troponin I: 0.03 ng/mL (ref <0.031)
Troponin I: 0.03 ng/mL (ref <0.031)

## 2014-12-23 LAB — COMPREHENSIVE METABOLIC PANEL
ALK PHOS: 65 U/L (ref 38–126)
ALT: 19 U/L (ref 17–63)
AST: 19 U/L (ref 15–41)
Albumin: 3.8 g/dL (ref 3.5–5.0)
Anion gap: 6 (ref 5–15)
BILIRUBIN TOTAL: 0.6 mg/dL (ref 0.3–1.2)
BUN: 21 mg/dL — AB (ref 6–20)
CO2: 27 mmol/L (ref 22–32)
CREATININE: 1.32 mg/dL — AB (ref 0.61–1.24)
Calcium: 9.6 mg/dL (ref 8.9–10.3)
Chloride: 106 mmol/L (ref 101–111)
GFR calc Af Amer: >60 mL/min (ref >60)
Glucose, Bld: 88 mg/dL (ref 65–99)
Potassium: 4.5 mmol/L (ref 3.5–5.1)
Sodium: 139 mmol/L (ref 135–145)
TOTAL PROTEIN: 7 g/dL (ref 6.5–8.1)

## 2014-12-23 LAB — LIPID PANEL
CHOLESTEROL: 120 mg/dL (ref 0–200)
HDL: 31 mg/dL — ABNORMAL LOW (ref >40)
LDL Cholesterol: 76 mg/dL (ref 0–99)
Total CHOL/HDL Ratio: 3.9 RATIO
Triglycerides: 63 mg/dL (ref <150)
VLDL: 13 mg/dL (ref 0–40)

## 2014-12-23 NOTE — Progress Notes (Signed)
Patient ID: MAKEL ELTING, male   DOB: 08-08-1960, 54 y.o.   MRN: [id number]    Patient Name: Christopher [name] Date of Encounter: 12/23/2014     Principal Problem:   Chest pain on exertion Active Problems:   Heavy smoker (more than 20 cigarettes per day)   CAD (coronary artery disease), native coronary artery   Chronic systolic CHF (congestive heart failure) ([place])   Hyperlipidemia   AICD (automatic cardioverter/defibrillator) present    SUBJECTIVE  No chest pain or sob. He is anxious.   CURRENT MEDS . aspirin  81 mg Oral Daily  . atorvastatin  80 mg Oral q1800  . carvedilol  3.125 mg Oral BID WC  . clopidogrel  75 mg Oral Daily  . digoxin  0.125 mg Oral Daily  . enoxaparin (LOVENOX) injection  40 mg Subcutaneous Q24H  . Influenza vac split quadrivalent PF  0.5 mL Intramuscular Tomorrow-1000  . lisinopril  2.5 mg Oral Daily  . nicotine  21 mg Transdermal Daily    OBJECTIVE  Filed Vitals:   12/22/14 2100 12/22/14 2106 12/23/14 0022 12/23/14 0600  BP: 139/114 138/92 116/74 99/69  Pulse: 80 74 82   Temp:  98.6 F (37 C) 98.2 F (36.8 C) 97.6 F (36.4 C)  TempSrc:  Oral Oral Oral  Resp: 19 18 18 18   Height:  5\' 8"  (1.727 m)    Weight:  142 lb 14.4 oz (64.819 kg)  141 lb (63.957 kg)  SpO2: 99% 99% 99% 97%    Intake/Output Summary (Last 24 hours) at 12/23/14 0819 Last data filed at 12/23/14 0600  Gross per 24 hour  Intake    240 ml  Output    400 ml  Net   -160 ml   Filed Weights   12/22/14 1614 12/22/14 2106 12/23/14 0600  Weight: 150 lb (68.04 kg) 142 lb 14.4 oz (64.819 kg) 141 lb (63.957 kg)    PHYSICAL EXAM  General: Pleasant, middle aged man, NAD. Neuro: Alert and oriented X 3. Moves all extremities spontaneously. Psych: Normal affect. HEENT:  Normal  Neck: Supple without bruits or JVD. Lungs:  Resp regular and unlabored, CTA. Heart: RRR no s3, s4, or murmurs. Abdomen: Soft, non-tender, non-distended, BS + x 4.  Extremities: No clubbing, cyanosis  or edema. DP/PT/Radials 2+ and equal bilaterally.  Accessory Clinical Findings  CBC  Recent Labs  12/22/14 1637 12/22/14 2153  WBC 8.5 8.4  HGB 16.6 17.3*  HCT 48.2 49.9  MCV 92.9 93.4  PLT 106* [id number]*   Basic Metabolic Panel  Recent Labs  12/22/14 1637 12/22/14 2153 12/23/14 0331  NA 137  --  139  K 5.0  --  4.5  CL 105  --  106  CO2 22  --  27  GLUCOSE 103*  --  88  BUN 18  --  21*  CREATININE 1.20 1.26* 1.32*  CALCIUM 9.6  --  9.6   Liver Function Tests  Recent Labs  12/23/14 0331  AST 19  ALT 19  ALKPHOS 65  BILITOT 0.6  PROT 7.0  ALBUMIN 3.8   No results for input(s): LIPASE, AMYLASE in the last 72 hours. Cardiac Enzymes  Recent Labs  12/22/14 2153 12/23/14 0025 12/23/14 0331  TROPONINI 0.03 0.03 0.03   BNP Invalid input(s): POCBNP D-Dimer No results for input(s): DDIMER in the last 72 hours. Hemoglobin A1C No results for input(s): HGBA1C in the last 72 hours. Fasting Lipid Panel  Recent Labs  12/23/14 0331  CHOL 120  HDL 31*  LDLCALC 76  TRIG 63  CHOLHDL 3.9   Thyroid Function Tests No results for input(s): TSH, T4TOTAL, T3FREE, THYROIDAB in the last 72 hours.  Invalid input(s): FREET3  TELE  NSR  Radiology/Studies  Dg Chest 2 View  12/22/2014  CLINICAL DATA:  Chest pain. EXAM: CHEST  2 VIEW COMPARISON:  September 08, 2013. FINDINGS: The heart size and mediastinal contours are within normal limits. Both lungs are clear. No pneumothorax or pleural effusion is noted. Left-sided pacemaker is unchanged in position. The visualized skeletal structures are unremarkable. IMPRESSION: No active cardiopulmonary disease. Electronically Signed   By: Marijo Conception, M.D.   On: 12/22/2014 18:03    ASSESSMENT AND PLAN  1. Chest pain 2. S/p MI with an ICM, EF 25-30% 3. Remote ICD implant 4. Anxiety Rec: the patient is quite anxious and has ruled out for MI but he has known disease. He has multiple cardiac risk factors. His chest pressure  is like his prior symptoms before his MI, though not as severe and his symptoms come and go with exertion. I would be uncomfortable sending him home without a stress test. The nuclear med lab is closed today. We will schedule stress testing tomorrow and if no ischemia, he could be discharged home.   Jarryd Gratz,M.D.  12/23/2014 8:19 AM
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
## 2014-12-24 ENCOUNTER — Observation Stay (HOSPITAL_BASED_OUTPATIENT_CLINIC_OR_DEPARTMENT_OTHER): Payer: 59

## 2014-12-24 ENCOUNTER — Encounter (HOSPITAL_COMMUNITY): Payer: Self-pay | Admitting: Physician Assistant

## 2014-12-24 ENCOUNTER — Observation Stay (HOSPITAL_COMMUNITY): Payer: 59

## 2014-12-24 ENCOUNTER — Other Ambulatory Visit: Payer: Self-pay | Admitting: Physician Assistant

## 2014-12-24 DIAGNOSIS — Z95810 Presence of automatic (implantable) cardiac defibrillator: Secondary | ICD-10-CM | POA: Diagnosis not present

## 2014-12-24 DIAGNOSIS — R079 Chest pain, unspecified: Secondary | ICD-10-CM

## 2014-12-24 DIAGNOSIS — I25110 Atherosclerotic heart disease of native coronary artery with unstable angina pectoris: Secondary | ICD-10-CM | POA: Diagnosis not present

## 2014-12-24 DIAGNOSIS — I5022 Chronic systolic (congestive) heart failure: Secondary | ICD-10-CM | POA: Diagnosis not present

## 2014-12-24 LAB — NM MYOCAR MULTI W/SPECT W/WALL MOTION / EF
CHL CUP MPHR: 168 {beats}/min
CHL CUP NUCLEAR SSS: 23
CSEPEW: 1 METS
CSEPHR: 66 %
CSEPPHR: 112 {beats}/min
Exercise duration (min): 0 min
Exercise duration (sec): 0 s
LHR: 0
LVDIAVOL: 347 mL
LVSYSVOL: 292 mL
NUC STRESS TID: 1.1
Rest HR: 65 {beats}/min
SDS: 5
SRS: 18

## 2014-12-24 MED ORDER — REGADENOSON 0.4 MG/5ML IV SOLN
0.4000 mg | Freq: Once | INTRAVENOUS | Status: AC
  Administered 2014-12-24: 0.4 mg via INTRAVENOUS
  Filled 2014-12-24: qty 5

## 2014-12-24 MED ORDER — NICOTINE 21 MG/24HR TD PT24: 21.0000 mg | MEDICATED_PATCH | Freq: Every day | TRANSDERMAL | Status: DC

## 2014-12-24 MED ORDER — ALBUTEROL SULFATE HFA 108 (90 BASE) MCG/ACT IN AERS: 2.0000 | INHALATION_SPRAY | Freq: Four times a day (QID) | RESPIRATORY_TRACT | Status: DC | PRN

## 2014-12-24 MED ORDER — REGADENOSON 0.4 MG/5ML IV SOLN
INTRAVENOUS | Status: AC
  Administered 2014-12-24: 0.4 mg via INTRAVENOUS
  Filled 2014-12-24: qty 5

## 2014-12-24 MED ORDER — TECHNETIUM TC 99M SESTAMIBI GENERIC - CARDIOLITE
10.0000 | Freq: Once | INTRAVENOUS | Status: AC | PRN
  Administered 2014-12-24: 10 via INTRAVENOUS

## 2014-12-24 MED ORDER — TECHNETIUM TC 99M SESTAMIBI GENERIC - CARDIOLITE
30.0000 | Freq: Once | INTRAVENOUS | Status: AC | PRN
  Administered 2014-12-24: 30 via INTRAVENOUS

## 2014-12-24 NOTE — Progress Notes (Signed)
Study Result      There was no ST segment deviation noted during stress.  T wave inversion was noted during stress in the V5 and V6 leads, beginning at 0 minutes of stress. T wave inversion persisted.  Defect 1: There is a large defect of severe severity present in the basal anterior, basal anteroseptal, mid anterior, mid anteroseptal, apical anterior, apical septal and apex location.  Defect 2: There is a medium defect of moderate severity present in the basal inferior, mid inferior and apical inferior location.  Defect 3: There is a small defect of mild severity present in the basal inferoseptal and mid inferoseptal location.  Findings consistent with prior myocardial infarction.  This is a high risk study.  The left ventricular ejection fraction is severely decreased (<30%).  Severely depressed systolic function, LVEF [id number]. Severely dilated LV. Akinesis of the anterior, inferior, and apical walls. Septal dyskinesis. Findings consistent with prior infarction and no inducible ischemia.   No ischemia noted, ok for discharge. Followup with Dr. Wynonia Lawman.  Hilbert Corrigan PA Pager: [phone]
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
## 2014-12-24 NOTE — Progress Notes (Signed)
1 day lexiscan myoview completed without significant complication, pending result by [facility] heartcare  Signed, Almyra Deforest PA Pager: [phone]
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
## 2014-12-24 NOTE — Progress Notes (Signed)
SUBJECTIVE: The patient is doing well today.  At this time, he denies further chest pain, no shortness of breath, or any new concerns.  Marland Kitchen aspirin  81 mg Oral Daily  . atorvastatin  80 mg Oral q1800  . carvedilol  3.125 mg Oral BID WC  . clopidogrel  75 mg Oral Daily  . digoxin  0.125 mg Oral Daily  . enoxaparin (LOVENOX) injection  40 mg Subcutaneous Q24H  . lisinopril  2.5 mg Oral Daily  . nicotine  21 mg Transdermal Daily      OBJECTIVE: Physical Exam: Filed Vitals:   12/23/14 0022 12/23/14 0600 12/23/14 2055 12/24/14 0556  BP: 116/74 99/69 98/84  91/59  Pulse: 82  78 62  Temp: 98.2 F (36.8 C) 97.6 F (36.4 C) 97.8 F (36.6 C) 98 F (36.7 C)  TempSrc: Oral Oral Oral Oral  Resp: 18 18 18 18   Height:      Weight:  141 lb (63.957 kg)  141 lb 6.4 oz (64.139 kg)  SpO2: 99% 97% 100% 99%    Intake/Output Summary (Last 24 hours) at 12/24/14 [id number] Last data filed at 12/23/14 2100  Gross per 24 hour  Intake    240 ml  Output    750 ml  Net   -510 ml    Telemetry reveals SB/SR, 50's-70's   GEN- The patient is well appearing, alert and oriented x 3 today.   Head- normocephalic, atraumatic Eyes-  Sclera clear, conjunctiva pink Ears- hearing intact Neck- supple, no JVP Lungs- Clear to ausculation bilaterally, normal work of breathing Heart- Regular rate and rhythm, no significant murmurs, no rubs or gallops GI- soft, NT Extremities- no clubbing, cyanosis, or edema Skin- no rash or lesion Psych- euthymic mood, full affect Neuro- no gross deficits appreciated  LABS: Basic Metabolic Panel:  Recent Labs  12/22/14 1637 12/22/14 2153 12/23/14 0331  NA 137  --  139  K 5.0  --  4.5  CL 105  --  106  CO2 22  --  27  GLUCOSE 103*  --  88  BUN 18  --  21*  CREATININE 1.20 1.26* 1.32*  CALCIUM 9.6  --  9.6   Liver Function Tests:  Recent Labs  12/23/14 0331  AST 19  ALT 19  ALKPHOS 65  BILITOT 0.6  PROT 7.0  ALBUMIN 3.8   CBC:  Recent Labs  12/22/14 1637 12/22/14 2153  WBC 8.5 8.4  HGB 16.6 17.3*  HCT 48.2 49.9  MCV 92.9 93.4  PLT 106* 110*   Cardiac Enzymes:  Recent Labs  12/22/14 2153 12/23/14 0025 12/23/14 0331  TROPONINI 0.03 0.03 0.03   Fasting Lipid Panel:  Recent Labs  12/23/14 0331  CHOL 120  HDL 31*  LDLCALC 76  TRIG 63  CHOLHDL 3.9   RADIOLOGY: Dg Chest 2 View 12/22/2014  CLINICAL DATA:  Chest pain. EXAM: CHEST  2 VIEW COMPARISON:  September 08, 2013. FINDINGS: The heart size and mediastinal contours are within normal limits. Both lungs are clear. No pneumothorax or pleural effusion is noted. Left-sided pacemaker is unchanged in position. The visualized skeletal structures are unremarkable. IMPRESSION: No active cardiopulmonary disease. Electronically Signed   By: Marijo Conception, M.D.   On: 12/22/2014 18:03    ASSESSMENT AND PLAN:  Principal Problem:   Chest pain on exertion Active Problems:   Heavy smoker (more than 20 cigarettes per day)   CAD (coronary artery disease), native coronary artery   Chronic systolic CHF (  congestive heart failure) (HCC)   Hyperlipidemia   AICD (automatic cardioverter/defibrillator) present  1. CP     r/o with 3 negative troponins     Pending result stress myoview today, if no ischemia plan to discharge home     + CAD with DES to LAD May 2015      on ASA/Plavix, statin, BB     Lipids look well controlled  2. ICM     appears compensated     On BB/ACE, dig     ICD  3. Tobacco abuse      Counseled, he is motivated but reports significant personal stressors that are a barrier to quitting       Tommye Standard, PA-C 12/24/2014 7:12 AM   EP Attending  Patient seen and examined. Awaiting results of stress test. If high risk due to ischemia, then he will need heart cath. If no significant ischemia, ok to discharge. He will have LV dysfunction due to prior MI.  Mikle Bosworth.D.

## 2014-12-27 ENCOUNTER — Encounter (HOSPITAL_COMMUNITY): Payer: Self-pay | Admitting: Emergency Medicine

## 2014-12-27 ENCOUNTER — Emergency Department (HOSPITAL_COMMUNITY): Payer: 59

## 2014-12-27 ENCOUNTER — Inpatient Hospital Stay (HOSPITAL_COMMUNITY)
Admission: EM | Admit: 2014-12-27 | Discharge: 2014-12-29 | DRG: 247 | Disposition: A | Discharge status: Home / Self Care | Payer: 59 | Attending: Internal Medicine | Admitting: Internal Medicine

## 2014-12-27 DIAGNOSIS — R079 Chest pain, unspecified: Secondary | ICD-10-CM | POA: Diagnosis present

## 2014-12-27 DIAGNOSIS — Z955 Presence of coronary angioplasty implant and graft: Secondary | ICD-10-CM

## 2014-12-27 DIAGNOSIS — Z7982 Long term (current) use of aspirin: Secondary | ICD-10-CM

## 2014-12-27 DIAGNOSIS — E785 Hyperlipidemia, unspecified: Secondary | ICD-10-CM | POA: Diagnosis present

## 2014-12-27 DIAGNOSIS — Z95810 Presence of automatic (implantable) cardiac defibrillator: Secondary | ICD-10-CM | POA: Diagnosis present

## 2014-12-27 DIAGNOSIS — N189 Chronic kidney disease, unspecified: Secondary | ICD-10-CM | POA: Diagnosis present

## 2014-12-27 DIAGNOSIS — I5022 Chronic systolic (congestive) heart failure: Secondary | ICD-10-CM | POA: Diagnosis present

## 2014-12-27 DIAGNOSIS — I252 Old myocardial infarction: Secondary | ICD-10-CM

## 2014-12-27 DIAGNOSIS — F17210 Nicotine dependence, cigarettes, uncomplicated: Secondary | ICD-10-CM | POA: Diagnosis present

## 2014-12-27 DIAGNOSIS — G4700 Insomnia, unspecified: Secondary | ICD-10-CM | POA: Diagnosis present

## 2014-12-27 DIAGNOSIS — I214 Non-ST elevation (NSTEMI) myocardial infarction: Principal | ICD-10-CM | POA: Diagnosis present

## 2014-12-27 DIAGNOSIS — Z79899 Other long term (current) drug therapy: Secondary | ICD-10-CM

## 2014-12-27 DIAGNOSIS — Z7902 Long term (current) use of antithrombotics/antiplatelets: Secondary | ICD-10-CM

## 2014-12-27 DIAGNOSIS — R7989 Other specified abnormal findings of blood chemistry: Secondary | ICD-10-CM

## 2014-12-27 DIAGNOSIS — F419 Anxiety disorder, unspecified: Secondary | ICD-10-CM | POA: Diagnosis present

## 2014-12-27 DIAGNOSIS — J449 Chronic obstructive pulmonary disease, unspecified: Secondary | ICD-10-CM | POA: Diagnosis present

## 2014-12-27 DIAGNOSIS — K056 Periodontal disease, unspecified: Secondary | ICD-10-CM | POA: Diagnosis present

## 2014-12-27 DIAGNOSIS — I2582 Chronic total occlusion of coronary artery: Secondary | ICD-10-CM | POA: Diagnosis present

## 2014-12-27 DIAGNOSIS — I255 Ischemic cardiomyopathy: Secondary | ICD-10-CM | POA: Insufficient documentation

## 2014-12-27 DIAGNOSIS — R778 Other specified abnormalities of plasma proteins: Secondary | ICD-10-CM

## 2014-12-27 DIAGNOSIS — D696 Thrombocytopenia, unspecified: Secondary | ICD-10-CM | POA: Diagnosis present

## 2014-12-27 DIAGNOSIS — I25110 Atherosclerotic heart disease of native coronary artery with unstable angina pectoris: Secondary | ICD-10-CM | POA: Diagnosis present

## 2014-12-27 HISTORY — DX: Thrombocytopenia, unspecified: D69.6

## 2014-12-27 HISTORY — DX: Atherosclerotic heart disease of native coronary artery without angina pectoris: I25.10

## 2014-12-27 HISTORY — DX: Chronic systolic (congestive) heart failure: I50.22

## 2014-12-27 LAB — I-STAT TROPONIN, ED
TROPONIN I, POC: 0.24 ng/mL — AB (ref 0.00–0.08)
Troponin i, poc: 1.05 ng/mL (ref 0.00–0.08)

## 2014-12-27 LAB — CBC
HCT: 41.5 % (ref 39.0–52.0)
Hemoglobin: 14.3 g/dL (ref 13.0–17.0)
MCH: 32.6 pg (ref 26.0–34.0)
MCHC: 34.5 g/dL (ref 30.0–36.0)
MCV: 94.5 fL (ref 78.0–100.0)
PLATELETS: 85 10*3/uL — AB (ref 150–400)
RBC: 4.39 MIL/uL (ref 4.22–5.81)
RDW: 14.3 % (ref 11.5–15.5)
WBC: 7.7 10*3/uL (ref 4.0–10.5)

## 2014-12-27 LAB — BASIC METABOLIC PANEL
Anion gap: 7 (ref 5–15)
BUN: 14 mg/dL (ref 6–20)
CALCIUM: 8.7 mg/dL — AB (ref 8.9–10.3)
CO2: 24 mmol/L (ref 22–32)
CREATININE: 1.1 mg/dL (ref 0.61–1.24)
Chloride: 108 mmol/L (ref 101–111)
Glucose, Bld: 103 mg/dL — ABNORMAL HIGH (ref 65–99)
Potassium: 4.6 mmol/L (ref 3.5–5.1)
SODIUM: 139 mmol/L (ref 135–145)

## 2014-12-27 LAB — TROPONIN I: Troponin I: 2.88 ng/mL (ref <0.031)

## 2014-12-27 LAB — HEPARIN LEVEL (UNFRACTIONATED): Heparin Unfractionated: 0.11 IU/mL — ABNORMAL LOW (ref 0.30–0.70)

## 2014-12-27 MED ORDER — SODIUM CHLORIDE 0.9 % IV SOLN: 250.0000 mL | INTRAVENOUS | Status: DC | PRN

## 2014-12-27 MED ORDER — CLOPIDOGREL BISULFATE 75 MG PO TABS
75.0000 mg | ORAL_TABLET | Freq: Every day | ORAL | Status: DC
  Administered 2014-12-28: 75 mg via ORAL
  Filled 2014-12-27 (×2): qty 1

## 2014-12-27 MED ORDER — HEPARIN (PORCINE) IN NACL 100-0.45 UNIT/ML-% IJ SOLN
1050.0000 [IU]/h | INTRAMUSCULAR | Status: DC
  Administered 2014-12-27: 800 [IU]/h via INTRAVENOUS
  Filled 2014-12-27: qty 250

## 2014-12-27 MED ORDER — ONDANSETRON HCL 4 MG/2ML IJ SOLN: 4.0000 mg | Freq: Four times a day (QID) | INTRAMUSCULAR | Status: DC | PRN

## 2014-12-27 MED ORDER — CARVEDILOL 3.125 MG PO TABS
3.1250 mg | ORAL_TABLET | Freq: Two times a day (BID) | ORAL | Status: DC
  Administered 2014-12-28 – 2014-12-29 (×3): 3.125 mg via ORAL
  Filled 2014-12-27 (×3): qty 1

## 2014-12-27 MED ORDER — SODIUM CHLORIDE 0.9 % IJ SOLN: 3.0000 mL | INTRAMUSCULAR | Status: DC | PRN

## 2014-12-27 MED ORDER — ASPIRIN 81 MG PO CHEW
81.0000 mg | CHEWABLE_TABLET | Freq: Every day | ORAL | Status: DC
  Administered 2014-12-29: 10:00:00 81 mg via ORAL
  Filled 2014-12-27 (×2): qty 1

## 2014-12-27 MED ORDER — ASPIRIN 81 MG PO CHEW
81.0000 mg | CHEWABLE_TABLET | ORAL | Status: AC
Start: 2014-12-28 — End: 2014-12-28
  Administered 2014-12-28: 81 mg via ORAL
  Filled 2014-12-27: qty 1

## 2014-12-27 MED ORDER — SODIUM CHLORIDE 0.9 % IV SOLN
INTRAVENOUS | Status: DC
  Administered 2014-12-28: 06:00:00 via INTRAVENOUS

## 2014-12-27 MED ORDER — HEPARIN BOLUS VIA INFUSION
2000.0000 [IU] | Freq: Once | INTRAVENOUS | Status: AC
  Administered 2014-12-27: 2000 [IU] via INTRAVENOUS
  Filled 2014-12-27: qty 2000

## 2014-12-27 MED ORDER — ATORVASTATIN CALCIUM 80 MG PO TABS
80.0000 mg | ORAL_TABLET | Freq: Every day | ORAL | Status: DC
  Administered 2014-12-28: 18:00:00 80 mg via ORAL
  Filled 2014-12-27: qty 1

## 2014-12-27 MED ORDER — MORPHINE SULFATE (PF) 2 MG/ML IV SOLN: 2.0000 mg | INTRAVENOUS | Status: DC | PRN

## 2014-12-27 MED ORDER — SODIUM CHLORIDE 0.9 % IJ SOLN
3.0000 mL | Freq: Two times a day (BID) | INTRAMUSCULAR | Status: DC
  Administered 2014-12-27: 3 mL via INTRAVENOUS

## 2014-12-27 MED ORDER — DIGOXIN 125 MCG PO TABS
0.1250 mg | ORAL_TABLET | Freq: Every day | ORAL | Status: DC
  Administered 2014-12-29: 10:00:00 0.125 mg via ORAL
  Filled 2014-12-27 (×3): qty 1

## 2014-12-27 MED ORDER — GI COCKTAIL ~~LOC~~
30.0000 mL | Freq: Four times a day (QID) | ORAL | Status: DC | PRN
  Filled 2014-12-27: qty 30

## 2014-12-27 MED ORDER — ALBUTEROL SULFATE (2.5 MG/3ML) 0.083% IN NEBU: 2.5000 mg | INHALATION_SOLUTION | Freq: Four times a day (QID) | RESPIRATORY_TRACT | Status: DC | PRN

## 2014-12-27 MED ORDER — NITROGLYCERIN 0.4 MG SL SUBL: 0.4000 mg | SUBLINGUAL_TABLET | SUBLINGUAL | Status: DC | PRN

## 2014-12-27 MED ORDER — ACETAMINOPHEN 325 MG PO TABS: 650.0000 mg | ORAL_TABLET | ORAL | Status: DC | PRN

## 2014-12-27 NOTE — Progress Notes (Addendum)
[place] for heparin Indication: chest pain/ACS  Allergies  Allergen Reactions  . Celebrex [Celecoxib] Other (See Comments)    Caused his back to burn    Patient Measurements: Height: 5\' 8"  (172.7 cm) Weight: 143 lb 4.8 oz (65 kg) IBW/kg (Calculated) : 68.4 Heparin Dosing Weight:   Vital Signs: Temp: 97.6 F (36.4 C) (11/28 2018) Temp Source: Oral (11/28 2018) BP: 126/84 mmHg (11/28 2018) Pulse Rate: 69 (11/28 2018)  Labs:  Recent Labs  12/27/14 1048 12/27/14 1838 12/27/14 2225  HGB 14.3  --   --   HCT 41.5  --   --   PLT 85*  --   --   HEPARINUNFRC  --   --  0.11*  CREATININE 1.10  --   --   TROPONINI  --  2.88*  --     Estimated Creatinine Clearance: 72.2 mL/min (by C-G formula based on Cr of 1.1).    Assessment: 54 y.o. male with chest pain for heparin   Goal of Therapy:  Heparin level 0.3-0.7 units/ml Monitor platelets by anticoagulation protocol: Yes   Plan:  Heparin 2000 units IV bolus, then increase heparin 1050 units/hr .Follow-up am labs.   Ricahrd Schwager, Bronson Curb 12/27/2014,11:38 PM  Addendum: Repeat level 0.3 this morning  Continue Heparin at current rate F/U after cath today  Phillis Knack, PharmD, BCPS 12/28/2014 7:42 AM

## 2014-12-27 NOTE — Progress Notes (Signed)
Patient troponin positive 2.88.  Cardiology fellow on call notified. Patient has no complaint of chest pain or discomfort at this time. Will continue to monitor.

## 2014-12-27 NOTE — ED Notes (Signed)
Patient transported to X-ray 

## 2014-12-27 NOTE — ED Notes (Signed)
Per EMS, patient has had intermittent chest pain for 1 month.   Patient was seen for same last week.   Per EMS, patient was given 324mg  ASA PO, and 1 NTG SL en route.  Chest pain resolved.   Patient states no pain at this time.  Patient states no other symptoms at this time.
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
## 2014-12-27 NOTE — ED Notes (Signed)
Dr Eulis Foster given results of troponin

## 2014-12-27 NOTE — ED Notes (Signed)
Report attempted 

## 2014-12-27 NOTE — ED Notes (Signed)
Dr. Wynonia Lawman at bedside discussing patient's admission and need for another cardiac cath.
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
## 2014-12-28 ENCOUNTER — Encounter (HOSPITAL_COMMUNITY)
Admission: EM | Disposition: A | Discharge status: Home / Self Care | Payer: 59 | Source: Home / Self Care | Attending: Internal Medicine

## 2014-12-28 ENCOUNTER — Encounter (HOSPITAL_COMMUNITY): Payer: Self-pay | Admitting: Cardiology

## 2014-12-28 DIAGNOSIS — I2510 Atherosclerotic heart disease of native coronary artery without angina pectoris: Secondary | ICD-10-CM | POA: Diagnosis not present

## 2014-12-28 DIAGNOSIS — R079 Chest pain, unspecified: Secondary | ICD-10-CM | POA: Diagnosis not present

## 2014-12-28 DIAGNOSIS — Z95810 Presence of automatic (implantable) cardiac defibrillator: Secondary | ICD-10-CM

## 2014-12-28 DIAGNOSIS — I255 Ischemic cardiomyopathy: Secondary | ICD-10-CM | POA: Insufficient documentation

## 2014-12-28 DIAGNOSIS — I214 Non-ST elevation (NSTEMI) myocardial infarction: Secondary | ICD-10-CM | POA: Diagnosis not present

## 2014-12-28 DIAGNOSIS — R7989 Other specified abnormal findings of blood chemistry: Secondary | ICD-10-CM

## 2014-12-28 HISTORY — PX: CARDIAC CATHETERIZATION: SHX172

## 2014-12-28 LAB — BASIC METABOLIC PANEL
ANION GAP: 5 (ref 5–15)
BUN: 16 mg/dL (ref 6–20)
CALCIUM: 8.9 mg/dL (ref 8.9–10.3)
CO2: 27 mmol/L (ref 22–32)
CREATININE: 1.1 mg/dL (ref 0.61–1.24)
Chloride: 107 mmol/L (ref 101–111)
GFR calc non Af Amer: >60 mL/min (ref >60)
Glucose, Bld: 114 mg/dL — ABNORMAL HIGH (ref 65–99)
Potassium: 4.2 mmol/L (ref 3.5–5.1)
SODIUM: 139 mmol/L (ref 135–145)

## 2014-12-28 LAB — CBC
HCT: 43.3 % (ref 39.0–52.0)
HEMOGLOBIN: 14.7 g/dL (ref 13.0–17.0)
MCH: 32.2 pg (ref 26.0–34.0)
MCHC: 33.9 g/dL (ref 30.0–36.0)
MCV: 95 fL (ref 78.0–100.0)
PLATELETS: 86 10*3/uL — AB (ref 150–400)
RBC: 4.56 MIL/uL (ref 4.22–5.81)
RDW: 14.4 % (ref 11.5–15.5)
WBC: 8.2 10*3/uL (ref 4.0–10.5)

## 2014-12-28 LAB — VITAMIN B12: Vitamin B-12: 362 pg/mL (ref 180–914)

## 2014-12-28 LAB — PROTIME-INR
INR: 1.04 (ref 0.00–1.49)
PROTHROMBIN TIME: 13.8 s (ref 11.6–15.2)

## 2014-12-28 LAB — HIV ANTIBODY (ROUTINE TESTING W REFLEX): HIV SCREEN 4TH GENERATION: NONREACTIVE

## 2014-12-28 LAB — HEPARIN LEVEL (UNFRACTIONATED): HEPARIN UNFRACTIONATED: 0.3 [IU]/mL (ref 0.30–0.70)

## 2014-12-28 LAB — POCT ACTIVATED CLOTTING TIME: ACTIVATED CLOTTING TIME: 681 s

## 2014-12-28 LAB — TROPONIN I
TROPONIN I: 3.06 ng/mL — AB (ref <0.031)
Troponin I: 3.08 ng/mL (ref <0.031)

## 2014-12-28 SURGERY — LEFT HEART CATH AND CORONARY ANGIOGRAPHY

## 2014-12-28 MED ORDER — VERAPAMIL HCL 2.5 MG/ML IV SOLN
INTRAVENOUS | Status: AC
  Filled 2014-12-28: qty 2

## 2014-12-28 MED ORDER — SODIUM CHLORIDE 0.9 % IV SOLN: INTRAVENOUS | Status: AC

## 2014-12-28 MED ORDER — BIVALIRUDIN BOLUS VIA INFUSION - CUPID
INTRAVENOUS | Status: DC | PRN
  Administered 2014-12-28: 48.9 mg via INTRAVENOUS

## 2014-12-28 MED ORDER — TICAGRELOR 90 MG PO TABS
ORAL_TABLET | ORAL | Status: AC
  Filled 2014-12-28: qty 2

## 2014-12-28 MED ORDER — ONDANSETRON HCL 4 MG/2ML IJ SOLN: 4.0000 mg | Freq: Four times a day (QID) | INTRAMUSCULAR | Status: DC | PRN

## 2014-12-28 MED ORDER — TICAGRELOR 90 MG PO TABS
ORAL_TABLET | ORAL | Status: DC | PRN
  Administered 2014-12-28: 180 mg via ORAL

## 2014-12-28 MED ORDER — TICAGRELOR 90 MG PO TABS
90.0000 mg | ORAL_TABLET | Freq: Two times a day (BID) | ORAL | Status: DC
  Administered 2014-12-28 – 2014-12-29 (×2): 90 mg via ORAL
  Filled 2014-12-28 (×2): qty 1

## 2014-12-28 MED ORDER — HEPARIN SODIUM (PORCINE) 1000 UNIT/ML IJ SOLN
INTRAMUSCULAR | Status: DC | PRN
  Administered 2014-12-28: 3200 [IU] via INTRAVENOUS

## 2014-12-28 MED ORDER — SODIUM CHLORIDE 0.9 % IJ SOLN
3.0000 mL | Freq: Two times a day (BID) | INTRAMUSCULAR | Status: DC
  Administered 2014-12-28 (×2): 3 mL via INTRAVENOUS

## 2014-12-28 MED ORDER — ANGIOPLASTY BOOK
Freq: Once | Status: AC
  Administered 2014-12-28: 21:00:00
  Filled 2014-12-28: qty 1

## 2014-12-28 MED ORDER — HEPARIN (PORCINE) IN NACL 2-0.9 UNIT/ML-% IJ SOLN
INTRAMUSCULAR | Status: AC
  Filled 2014-12-28: qty 1000

## 2014-12-28 MED ORDER — SODIUM CHLORIDE 0.9 % IV SOLN
250.0000 mg | INTRAVENOUS | Status: DC | PRN
  Administered 2014-12-28: 1.75 mg/kg/h via INTRAVENOUS

## 2014-12-28 MED ORDER — FENTANYL CITRATE (PF) 100 MCG/2ML IJ SOLN
INTRAMUSCULAR | Status: AC
  Filled 2014-12-28: qty 2

## 2014-12-28 MED ORDER — LIDOCAINE HCL (PF) 1 % IJ SOLN
INTRAMUSCULAR | Status: AC
  Filled 2014-12-28: qty 30

## 2014-12-28 MED ORDER — SODIUM CHLORIDE 0.9 % IJ SOLN: 3.0000 mL | INTRAMUSCULAR | Status: DC | PRN

## 2014-12-28 MED ORDER — ASPIRIN EC 81 MG PO TBEC: 81.0000 mg | DELAYED_RELEASE_TABLET | Freq: Every day | ORAL | Status: DC

## 2014-12-28 MED ORDER — HEPARIN SODIUM (PORCINE) 1000 UNIT/ML IJ SOLN
INTRAMUSCULAR | Status: AC
  Filled 2014-12-28: qty 1

## 2014-12-28 MED ORDER — MIDAZOLAM HCL 2 MG/2ML IJ SOLN
INTRAMUSCULAR | Status: DC | PRN
  Administered 2014-12-28 (×2): 1 mg via INTRAVENOUS

## 2014-12-28 MED ORDER — ACETAMINOPHEN 325 MG PO TABS: 650.0000 mg | ORAL_TABLET | ORAL | Status: DC | PRN

## 2014-12-28 MED ORDER — MIDAZOLAM HCL 2 MG/2ML IJ SOLN
INTRAMUSCULAR | Status: AC
  Filled 2014-12-28: qty 2

## 2014-12-28 MED ORDER — FENTANYL CITRATE (PF) 100 MCG/2ML IJ SOLN
INTRAMUSCULAR | Status: DC | PRN
  Administered 2014-12-28 (×2): 25 ug via INTRAVENOUS

## 2014-12-28 MED ORDER — LIDOCAINE HCL (PF) 1 % IJ SOLN
INTRAMUSCULAR | Status: DC | PRN
  Administered 2014-12-28: 15 mL via INTRA_ARTERIAL

## 2014-12-28 MED ORDER — SODIUM CHLORIDE 0.9 % IV SOLN: 250.0000 mL | INTRAVENOUS | Status: DC | PRN

## 2014-12-28 MED ORDER — NITROGLYCERIN 1 MG/10 ML FOR IR/CATH LAB
INTRA_ARTERIAL | Status: AC
  Filled 2014-12-28: qty 10

## 2014-12-28 MED ORDER — BIVALIRUDIN 250 MG IV SOLR
INTRAVENOUS | Status: AC
  Filled 2014-12-28: qty 250

## 2014-12-28 SURGICAL SUPPLY — 16 items
BALLN EMERGE MR 2.5X12 (BALLOONS) ×3
BALLN ~~LOC~~ EMERGE MR 4.0X12 (BALLOONS) ×3
CATH INFINITI 5FR ANG PIGTAIL (CATHETERS) ×3
CATH OPTITORQUE TIG 4.0 5F (CATHETERS) ×3
CATH VISTA GUIDE 6FR XB3.5 (CATHETERS) ×3
DEVICE RAD COMP TR BAND LRG (VASCULAR PRODUCTS) ×3
GLIDESHEATH SLEND A-KIT 6F 22G (SHEATH) ×3
KIT ENCORE 26 ADVANTAGE (KITS) ×3
KIT HEART LEFT (KITS) ×3
PACK CARDIAC CATHETERIZATION (CUSTOM PROCEDURE TRAY) ×3
STENT XIENCE ALPINE RX 3.5X15 (Permanent Stent) ×3 IMPLANT
SYR MEDRAD MARK V 150ML (SYRINGE) ×3
TRANSDUCER W/STOPCOCK (MISCELLANEOUS) ×3
TUBING CIL FLEX 10 FLL-RA (TUBING) ×3
WIRE COUGAR XT STRL 190CM (WIRE) ×3
WIRE SAFE-T 1.5MM-J .035X260CM (WIRE) ×3

## 2014-12-28 NOTE — H&P (View-Only) (Signed)
Subjective:  No recurrent pain overnight  Objective:  Vital Signs in the last 24 hours: BP 96/66 mmHg  Pulse 59  Temp(Src) 97.8 F (36.6 C) (Oral)  Resp 18  Ht 5\' 8"  (1.727 m)  Wt 65.182 kg (143 lb 11.2 oz)  BMI 21.85 kg/m2  SpO2 100%  Physical Exam: Anxious WM in NAD Lungs:  Clear Cardiac:  Regular rhythm, normal S1 and S2, no S3 Extremities:  No edema present  Intake/Output from previous day: 11/28 0701 - 11/29 0700 In: 550.9 [P.O.:420; I.V.:130.9] Out: 1000 [Urine:1000]  Weight Filed Weights   12/27/14 1731 12/27/14 1858 12/28/14 0500  Weight: 63.685 kg (140 lb 6.4 oz) 65 kg (143 lb 4.8 oz) 65.182 kg (143 lb 11.2 oz)    Lab Results: Basic Metabolic Panel:  Recent Labs  12/27/14 1048 12/28/14 0350  NA 139 139  K 4.6 4.2  CL 108 107  CO2 24 27  GLUCOSE 103* 114*  BUN 14 16  CREATININE 1.10 1.10   CBC:  Recent Labs  12/27/14 1048 12/28/14 0350  WBC 7.7 8.2  HGB 14.3 14.7  HCT 41.5 43.3  MCV 94.5 95.0  PLT 85* 86*   Cardiac Enzymes: Troponin (Point of Care Test)  Recent Labs  12/27/14 1403  TROPIPOC 1.05*   Cardiac Panel (last 3 results)  Recent Labs  12/27/14 1838 12/28/14 0020 12/28/14 0620  TROPONINI 2.88* 3.08* 3.06*    Telemetry: Sinus    Assessment/Plan:  1. Non STEMI 2. Ischemic cardiomyopathy 3. Thrombocytopenia  Plan:  Trponin is elevated and EKG with lateral ST depression.  On for cath later this am.      W. Doristine Church  MD [facility] Cardiology  12/28/2014, 9:04 AM
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
## 2014-12-28 NOTE — Progress Notes (Signed)
UR Completed Daven Montz Graves-Bigelow, RN,BSN [phone]  

## 2014-12-28 NOTE — Progress Notes (Signed)
Subjective:  No recurrent pain overnight  Objective:  Vital Signs in the last 24 hours: BP 96/66 mmHg  Pulse 59  Temp(Src) 97.8 F (36.6 C) (Oral)  Resp 18  Ht 5\' 8"  (1.727 m)  Wt 65.182 kg (143 lb 11.2 oz)  BMI 21.85 kg/m2  SpO2 100%  Physical Exam: Anxious WM in NAD Lungs:  Clear Cardiac:  Regular rhythm, normal S1 and S2, no S3 Extremities:  No edema present  Intake/Output from previous day: 11/28 0701 - 11/29 0700 In: 550.9 [P.O.:420; I.V.:130.9] Out: 1000 [Urine:1000]  Weight Filed Weights   12/27/14 1731 12/27/14 1858 12/28/14 0500  Weight: 63.685 kg (140 lb 6.4 oz) 65 kg (143 lb 4.8 oz) 65.182 kg (143 lb 11.2 oz)    Lab Results: Basic Metabolic Panel:  Recent Labs  12/27/14 1048 12/28/14 0350  NA 139 139  K 4.6 4.2  CL 108 107  CO2 24 27  GLUCOSE 103* 114*  BUN 14 16  CREATININE 1.10 1.10   CBC:  Recent Labs  12/27/14 1048 12/28/14 0350  WBC 7.7 8.2  HGB 14.3 14.7  HCT 41.5 43.3  MCV 94.5 95.0  PLT 85* 86*   Cardiac Enzymes: Troponin (Point of Care Test)  Recent Labs  12/27/14 1403  TROPIPOC 1.05*   Cardiac Panel (last 3 results)  Recent Labs  12/27/14 1838 12/28/14 0020 12/28/14 0620  TROPONINI 2.88* 3.08* 3.06*    Telemetry: Sinus    Assessment/Plan:  1. Non STEMI 2. Ischemic cardiomyopathy 3. Thrombocytopenia  Plan:  Trponin is elevated and EKG with lateral ST depression.  On for cath later this am.      W. Doristine Church  MD [facility] Cardiology  12/28/2014, 9:04 AM

## 2014-12-28 NOTE — Progress Notes (Signed)
[facility]                                                                              Patient Demographics  Christopher [name], is a 54 y.o. male, DOB - 03-07-1960, [id number]  Admit date - 12/27/2014   Admitting Physician Elmarie Shiley, MD  Outpatient Primary MD for the patient is Minerva Ends, MD  LOS - 1   Chief Complaint  Patient presents with  . Chest Pain       Brief HPI  Christopher [name] is a 54 y.o. male with coronary artery disease s/p PCI 123456, chronic systolic congestive heart failure / cardiomyopathy with AICD. Patient was just discharged home from the hospital 3 days ago after admission for chest pain. Troponins were normal . Myoview c/w prior infarct , no inducible ischemia, severely depressed systolic function with LVEF < 30%. Discharged home with recommendations to follow up with Cardiology ASAP. Home cardiac meds were continued. Patient brought by EMS to ED today for chest pain, worse than when admitted a few days ago. Patient discontinued tobacco 5 days ago. Troponins were positive. Patient was admitted, cardiology was consulted.    Assessment & Plan    Chest pain, elevated troponins with known CAD/ NSTEMI.  - Patient had undergone stress test negative for reversible ischemia, however depressed EF, now presented with positive troponins, worse chest pain. Troponin 3.08->3.06 - Patient started on heparin drip, cardiology was consulted. -Cardiology recommended cardiac cath planned today  Chronic systolic CHF / AICD.  -Continue Coreg, lanoxin  Chronic thrombocytopenia.  - Platelets stable, 86K , below baseline of 130s. Monitor platelets closely on Heparin.   COPD. Stable. Stopped smoking last week -Continue Nicotine patch -Continue home inhaler  Code Status: Full code  Family Communication: Discussed in detail with the patient, all imaging results, lab results explained to the patient    Disposition Plan: Cardiac cath  today  Time Spent in minutes  25 minutes  Procedures  Cardiac Cath  Consults   Cardiology  DVT Prophylaxis  heparin drip  Medications  Scheduled Meds: . [MAR Hold] aspirin  81 mg Oral Daily  . aspirin EC  81 mg Oral Daily  . [MAR Hold] atorvastatin  80 mg Oral q1800  . [MAR Hold] carvedilol  3.125 mg Oral BID WC  . [MAR Hold] clopidogrel  75 mg Oral Daily  . [MAR Hold] digoxin  0.125 mg Oral Daily  . sodium chloride  3 mL Intravenous Q12H  . sodium chloride  3 mL Intravenous Q12H  . ticagrelor  90 mg Oral BID   Continuous Infusions: . sodium chloride 75 mL/hr at 12/28/14 0558  . sodium chloride 50 mL/hr at 12/28/14 1117  . heparin 1,050 Units/hr (12/27/14 2350)   PRN Meds:.sodium chloride, sodium chloride, [MAR Hold] acetaminophen, acetaminophen, [MAR Hold] albuterol, [MAR Hold] gi cocktail, [MAR Hold]  morphine injection, [MAR Hold] nitroGLYCERIN, [MAR Hold] ondansetron (ZOFRAN) IV, ondansetron (ZOFRAN) IV, sodium chloride, sodium chloride   Antibiotics   Anti-infectives    None        Subjective:   Christopher [name] was seen and examined today.  Anxious but  no ongoing chest pain at the time of my encounter Patient denies dizziness,abdominal pain, N/V/D/C, new weakness, numbess, tingling. No acute events overnight.    Objective:   Blood pressure 103/79, pulse 71, temperature 97.8 F (36.6 C), temperature source Oral, resp. rate 16, height 5\' 8"  (1.727 m), weight 65.182 kg (143 lb 11.2 oz), SpO2 97 %.  Wt Readings from Last 3 Encounters:  12/28/14 65.182 kg (143 lb 11.2 oz)  12/24/14 64.139 kg (141 lb 6.4 oz)  09/28/14 67.223 kg (148 lb 3.2 oz)     Intake/Output Summary (Last 24 hours) at 12/28/14 1127 Last data filed at 12/28/14 1117  Gross per 24 hour  Intake 550.89 ml  Output   1185 ml  Net -634.11 ml    Exam  General: Alert and oriented x 3, NAD  HEENT:  PERRLA, EOMI, Anicteric Sclera, mucous membranes moist.   Neck: Supple, no JVD, no  masses  CVS: S1 S2 auscultated, no rubs, murmurs or gallops. Regular rate and rhythm.  Respiratory: Clear to auscultation bilaterally, no wheezing, rales or rhonchi  Abdomen: Soft, nontender, nondistended, + bowel sounds  Ext: no cyanosis clubbing or edema  Neuro: AAOx3, Cr N's II- XII. Strength 5/5 upper and lower extremities bilaterally  Skin: No rashes  Psych: Normal affect and demeanor, alert and oriented x3    Data Review   Micro Results No results found for this or any previous visit (from the past 240 hour(s)).  Radiology Reports Dg Chest 2 View  12/27/2014  CLINICAL DATA:  Intermittent chest pain for 1 month, resolved following nitroglycerin and aspirin, history defibrillator, CHF, coronary artery disease post MI, COPD, chronic kidney disease, former smoker EXAM: CHEST  2 VIEW COMPARISON:  12/22/2014 FINDINGS: LEFT subclavian AICD lead tip projects at RIGHT ventricle. Upper normal heart size. Mediastinal contours and pulmonary vascularity normal. Emphysematous and minimal bronchitic changes compatible with COPD. No acute infiltrate, pleural effusion, or pneumothorax. Osseous structures unremarkable. IMPRESSION: COPD changes. No acute abnormalities. Electronically Signed   By: Lavonia Dana M.D.   On: 12/27/2014 11:47   Dg Chest 2 View  12/22/2014  CLINICAL DATA:  Chest pain. EXAM: CHEST  2 VIEW COMPARISON:  September 08, 2013. FINDINGS: The heart size and mediastinal contours are within normal limits. Both lungs are clear. No pneumothorax or pleural effusion is noted. Left-sided pacemaker is unchanged in position. The visualized skeletal structures are unremarkable. IMPRESSION: No active cardiopulmonary disease. Electronically Signed   By: Marijo Conception, M.D.   On: 12/22/2014 18:03   Nm Myocar Multi W/spect W/wall Motion / Ef  12/24/2014   There was no ST segment deviation noted during stress.  T wave inversion was noted during stress in the V5 and V6 leads, beginning at 0  minutes of stress. T wave inversion persisted.  Defect 1: There is a large defect of severe severity present in the basal anterior, basal anteroseptal, mid anterior, mid anteroseptal, apical anterior, apical septal and apex location.  Defect 2: There is a medium defect of moderate severity present in the basal inferior, mid inferior and apical inferior location.  Defect 3: There is a small defect of mild severity present in the basal inferoseptal and mid inferoseptal location.  Findings consistent with prior myocardial infarction.  This is a high risk study.  The left ventricular ejection fraction is severely decreased (<30%).  Severely depressed systolic function, LVEF 123456. Severely dilated LV. Akinesis of the anterior, inferior, and apical walls.  Septal dyskinesis. Findings consistent with prior  infarction and no inducible ischemia.    CBC  Recent Labs Lab 12/22/14 1637 12/22/14 2153 12/27/14 1048 12/28/14 0350  WBC 8.5 8.4 7.7 8.2  HGB 16.6 17.3* 14.3 14.7  HCT 48.2 49.9 41.5 43.3  PLT 106* 110* 85* 86*  MCV 92.9 93.4 94.5 95.0  MCH 32.0 32.4 32.6 32.2  MCHC 34.4 34.7 34.5 33.9  RDW 14.3 14.3 14.3 14.4    Chemistries   Recent Labs Lab 12/22/14 1637 12/22/14 2153 12/23/14 0331 12/27/14 1048 12/28/14 0350  NA 137  --  139 139 139  K 5.0  --  4.5 4.6 4.2  CL 105  --  106 108 107  CO2 22  --  27 24 27   GLUCOSE 103*  --  88 103* 114*  BUN 18  --  21* 14 16  CREATININE 1.20 1.26* 1.32* 1.10 1.10  CALCIUM 9.6  --  9.6 8.7* 8.9  AST  --   --  19  --   --   ALT  --   --  19  --   --   ALKPHOS  --   --  65  --   --   BILITOT  --   --  0.6  --   --    ------------------------------------------------------------------------------------------------------------------ estimated creatinine clearance is 72.4 mL/min (by C-G formula based on Cr of 1.1). ------------------------------------------------------------------------------------------------------------------ No results  for input(s): HGBA1C in the last 72 hours. ------------------------------------------------------------------------------------------------------------------ No results for input(s): CHOL, HDL, LDLCALC, TRIG, CHOLHDL, LDLDIRECT in the last 72 hours. ------------------------------------------------------------------------------------------------------------------ No results for input(s): TSH, T4TOTAL, T3FREE, THYROIDAB in the last 72 hours.  Invalid input(s): FREET3 ------------------------------------------------------------------------------------------------------------------  Recent Labs  12/27/14 1939  VITAMINB12 362    Coagulation profile  Recent Labs Lab 12/28/14 0350  INR 1.04    No results for input(s): DDIMER in the last 72 hours.  Cardiac Enzymes  Recent Labs Lab 12/27/14 1838 12/28/14 0020 12/28/14 0620  TROPONINI 2.88* 3.08* 3.06*   ------------------------------------------------------------------------------------------------------------------ Invalid input(s): POCBNP  No results for input(s): GLUCAP in the last 72 hours.   Rishik Tubby M.D. [facility] 12/28/2014, 11:27 AM  Pager: [phone] Between 7am to 7pm - call Pager - [phone]  After 7pm go to [email or web address] - password [email or web address]  Call night coverage person covering after 7pm

## 2014-12-28 NOTE — Progress Notes (Signed)
TR BAND REMOVAL  LOCATION:    right radial  DEFLATED PER PROTOCOL:    Yes.    TIME BAND OFF / DRESSING APPLIED:    1530   SITE UPON ARRIVAL:    Level 0  SITE AFTER BAND REMOVAL:    Level 0  CIRCULATION SENSATION AND MOVEMENT:    Within Normal Limits   Yes.    COMMENTS:   Tolerated procedure well 

## 2014-12-28 NOTE — Interval H&P Note (Signed)
Cath Lab Visit (complete for each Cath Lab visit)  Clinical Evaluation Leading to the Procedure:   ACS: Yes.    Non-ACS:    Anginal Classification: CCS III  Anti-ischemic medical therapy: Maximal Therapy (2 or more classes of medications)  Non-Invasive Test Results: No non-invasive testing performed  Prior CABG: No previous CABG      History and Physical Interval Note:  12/28/2014 9:39 AM  Christopher [name]  has presented today for surgery, with the diagnosis of cp  The various methods of treatment have been discussed with the patient and family. After consideration of risks, benefits and other options for treatment, the patient has consented to  Procedure(s): Left Heart Cath and Coronary Angiography (N/A) as a surgical intervention .  The patient's history has been reviewed, patient examined, no change in status, stable for surgery.  I have reviewed the patient's chart and labs.  Questions were answered to the patient's satisfaction.     Nautia Lem A

## 2014-12-29 DIAGNOSIS — I214 Non-ST elevation (NSTEMI) myocardial infarction: Secondary | ICD-10-CM | POA: Diagnosis not present

## 2014-12-29 DIAGNOSIS — I5022 Chronic systolic (congestive) heart failure: Secondary | ICD-10-CM | POA: Diagnosis not present

## 2014-12-29 DIAGNOSIS — D696 Thrombocytopenia, unspecified: Secondary | ICD-10-CM

## 2014-12-29 DIAGNOSIS — I255 Ischemic cardiomyopathy: Secondary | ICD-10-CM | POA: Diagnosis not present

## 2014-12-29 LAB — BASIC METABOLIC PANEL
ANION GAP: 7 (ref 5–15)
BUN: 16 mg/dL (ref 6–20)
CHLORIDE: 106 mmol/L (ref 101–111)
CO2: 26 mmol/L (ref 22–32)
Calcium: 9.2 mg/dL (ref 8.9–10.3)
Creatinine, Ser: 1.12 mg/dL (ref 0.61–1.24)
GFR calc Af Amer: >60 mL/min (ref >60)
Glucose, Bld: 122 mg/dL — ABNORMAL HIGH (ref 65–99)
POTASSIUM: 4.2 mmol/L (ref 3.5–5.1)
SODIUM: 139 mmol/L (ref 135–145)

## 2014-12-29 LAB — CBC
HCT: 42.3 % (ref 39.0–52.0)
HEMOGLOBIN: 14.2 g/dL (ref 13.0–17.0)
MCH: 31.7 pg (ref 26.0–34.0)
MCHC: 33.6 g/dL (ref 30.0–36.0)
MCV: 94.4 fL (ref 78.0–100.0)
PLATELETS: 88 10*3/uL — AB (ref 150–400)
RBC: 4.48 MIL/uL (ref 4.22–5.81)
RDW: 14.5 % (ref 11.5–15.5)
WBC: 7.3 10*3/uL (ref 4.0–10.5)

## 2014-12-29 MED ORDER — TICAGRELOR 90 MG PO TABS: 90.0000 mg | ORAL_TABLET | Freq: Two times a day (BID) | ORAL | Status: DC

## 2014-12-29 MED FILL — Heparin Sodium (Porcine) 2 Unit/ML in Sodium Chloride 0.9%: INTRAMUSCULAR | Qty: 500 | Status: AC

## 2014-12-29 NOTE — Discharge Summary (Signed)
PATIENT DETAILS Name: Christopher [name] Age: 54 y.o. Sex: male Date of Birth: November 26, 1960 MRN: [id number]. Admitting Physician: Elmarie Shiley, MD [id number], Lennox Laity, MD  Admit Date: 12/27/2014 Discharge date: 12/29/2014  Recommendations for Outpatient Follow-up:  1. Please repeat CBC/BMET at next visit 2. Please workup thrombocytopenia in the outpatient setting  PRIMARY DISCHARGE DIAGNOSIS:  Principal Problem:   NSTEMI (non-ST elevated myocardial infarction) ([place]) Active Problems:   Chronic systolic CHF (congestive heart failure) (HCC)   AICD (automatic cardioverter/defibrillator) present   Thrombocytopenia (HCC)   Cardiomyopathy, ischemic      PAST MEDICAL HISTORY: Past Medical History  Diagnosis Date  . Automatic implantable cardioverter-defibrillator in situ     MDT Aug 2015 Dr. Lovena Le  . CHF (congestive heart failure) ([place]) 05/2013   . Myocardial infarction ([place]) 04/29/2013  . CAD (coronary artery disease)     PCI to LAD with DES May 2015  . Heavy smoker   . Hyperlipidemia   . COPD (chronic obstructive pulmonary disease) ([place])   . Chronic kidney disease   . Thrombocytopenia ([place]) 12/27/2014    Chronic    . Chronic systolic CHF (congestive heart failure) (HCC)     ECHO 08/05/13  EF 30%  Anterior akinesis and inferior hypokinesis   . CAD (coronary artery disease), native coronary artery 07/03/2013    Cath 06/20/13  Normal left main, occluded LAD, occluded RCA, 50% circ EF 15%  3.0 x28 and 3.0 x 8 mm Xience stent Dr. Tamala Julian  To LAD     DISCHARGE MEDICATIONS: Current Discharge Medication List    START taking these medications   Details  ticagrelor (BRILINTA) 90 MG TABS tablet Take 1 tablet (90 mg total) by mouth 2 (two) times daily. Qty: 240 tablet, Refills: 1      CONTINUE these medications which have NOT CHANGED   Details  albuterol (PROVENTIL HFA;VENTOLIN HFA) 108 (90 BASE) MCG/ACT inhaler Inhale 2 puffs into the lungs every 6 (six) hours as needed for  wheezing or shortness of breath. Qty: 1 Inhaler, Refills: 2    aspirin 81 MG chewable tablet Chew 1 tablet (81 mg total) by mouth daily. Qty: 30 tablet, Refills: 5    atorvastatin (LIPITOR) 80 MG tablet Take 1 tablet (80 mg total) by mouth daily at 6 PM. Qty: 30 tablet, Refills: 5    carvedilol (COREG) 3.125 MG tablet Take 1 tablet (3.125 mg total) by mouth 2 (two) times daily with a meal. Qty: 60 tablet, Refills: 5    digoxin (LANOXIN) 0.125 MG tablet Take 1 tablet (0.125 mg total) by mouth daily. Qty: 30 tablet, Refills: 5    lisinopril (PRINIVIL,ZESTRIL) 2.5 MG tablet Take 1 tablet (2.5 mg total) by mouth daily. Qty: 30 tablet, Refills: 5   Associated Diagnoses: Chronic systolic CHF (congestive heart failure) (HCC)    nicotine (NICODERM CQ - DOSED IN MG/24 HOURS) 21 mg/24hr patch Place 1 patch (21 mg total) onto the skin daily. Qty: 28 patch, Refills: 3      STOP taking these medications     clopidogrel (PLAVIX) 75 MG tablet         ALLERGIES:   Allergies  Allergen Reactions  . Celebrex [Celecoxib] Other (See Comments)    Caused his back to burn    BRIEF HPI:  See H&P, Labs, Consult and Test reports for all details in brief, patient was admitted for evaluation of chest pain.  CONSULTATIONS:   cardiology  PERTINENT RADIOLOGIC STUDIES: Dg Chest 2 View  12/27/2014  CLINICAL DATA:  Intermittent chest pain for 1 month, resolved following nitroglycerin and aspirin, history defibrillator, CHF, coronary artery disease post MI, COPD, chronic kidney disease, former smoker EXAM: CHEST  2 VIEW COMPARISON:  12/22/2014 FINDINGS: LEFT subclavian AICD lead tip projects at RIGHT ventricle. Upper normal heart size. Mediastinal contours and pulmonary vascularity normal. Emphysematous and minimal bronchitic changes compatible with COPD. No acute infiltrate, pleural effusion, or pneumothorax. Osseous structures unremarkable. IMPRESSION: COPD changes. No acute abnormalities.  Electronically Signed   By: Lavonia Dana M.D.   On: 12/27/2014 11:47   Dg Chest 2 View  12/22/2014  CLINICAL DATA:  Chest pain. EXAM: CHEST  2 VIEW COMPARISON:  September 08, 2013. FINDINGS: The heart size and mediastinal contours are within normal limits. Both lungs are clear. No pneumothorax or pleural effusion is noted. Left-sided pacemaker is unchanged in position. The visualized skeletal structures are unremarkable. IMPRESSION: No active cardiopulmonary disease. Electronically Signed   By: Marijo Conception, M.D.   On: 12/22/2014 18:03   Nm Myocar Multi W/spect W/wall Motion / Ef  12/24/2014   There was no ST segment deviation noted during stress.  T wave inversion was noted during stress in the V5 and V6 leads, beginning at 0 minutes of stress. T wave inversion persisted.  Defect 1: There is a large defect of severe severity present in the basal anterior, basal anteroseptal, mid anterior, mid anteroseptal, apical anterior, apical septal and apex location.  Defect 2: There is a medium defect of moderate severity present in the basal inferior, mid inferior and apical inferior location.  Defect 3: There is a small defect of mild severity present in the basal inferoseptal and mid inferoseptal location.  Findings consistent with prior myocardial infarction.  This is a high risk study.  The left ventricular ejection fraction is severely decreased (<30%).  Severely depressed systolic function, LVEF 123456. Severely dilated LV. Akinesis of the anterior, inferior, and apical walls.  Septal dyskinesis. Findings consistent with prior infarction and no inducible ischemia.     PERTINENT LAB RESULTS: CBC:  Recent Labs  12/28/14 0350 12/29/14 0517  WBC 8.2 7.3  HGB 14.7 14.2  HCT 43.3 42.3  PLT 86* 88*   CMET CMP     Component Value Date/Time   NA 139 12/29/2014 0517   K 4.2 12/29/2014 0517   CL 106 12/29/2014 0517   CO2 26 12/29/2014 0517   GLUCOSE 122* 12/29/2014 0517   BUN 16 12/29/2014  0517   CREATININE 1.12 12/29/2014 0517   CALCIUM 9.2 12/29/2014 0517   PROT 7.0 12/23/2014 0331   ALBUMIN 3.8 12/23/2014 0331   AST 19 12/23/2014 0331   ALT 19 12/23/2014 0331   ALKPHOS 65 12/23/2014 0331   BILITOT 0.6 12/23/2014 0331   GFRNONAA >60 12/29/2014 0517   GFRAA >60 12/29/2014 0517    GFR Estimated Creatinine Clearance: 71.7 mL/min (by C-G formula based on Cr of 1.12). No results for input(s): LIPASE, AMYLASE in the last 72 hours.  Recent Labs  12/27/14 1838 12/28/14 0020 12/28/14 0620  TROPONINI 2.88* 3.08* 3.06*   Invalid input(s): POCBNP No results for input(s): DDIMER in the last 72 hours. No results for input(s): HGBA1C in the last 72 hours. No results for input(s): CHOL, HDL, LDLCALC, TRIG, CHOLHDL, LDLDIRECT in the last 72 hours. No results for input(s): TSH, T4TOTAL, T3FREE, THYROIDAB in the last 72 hours.  Invalid input(s): FREET3  Recent Labs  12/27/14 1939  VITAMINB12 362   Coags:  Recent Labs  12/28/14 0350  INR 1.04   Microbiology: No results found for this or any previous visit (from the past 240 hour(s)).   BRIEF HOSPITAL COURSE:  Chest pain, elevated troponins with known CAD/ NSTEMI.  - Patient had undergone stress test negative for reversible ischemia, however depressed EF on 11/25 but re-presented with positive troponins, worsening chest pain. Troponin 3.08->3.06. Patient started on heparin drip, cardiology was consulted. Underwent left heart catheterization with stenting in the circumflex marginal branch. Currently remains chest pain-free, discussed with Dr. Tilley-recommends discharge on aspirin and brilinta. Will continue Coreg and beta blocker. Case management consulted for assistance with Brilinta and PCP needs. Stable for discharge after case management evaluation  Chronic systolic CHF / AICD: Compensated,Continue Coreg, lanoxin  Chronic thrombocytopenia:Stable at 88 K-has chronic thrombocytopenia at baseline-stable for  outpatient workup.  COPD:Stable. Stopped smoking last week-have counseled. Continue nicotine patch on discharge. Continue prior home inhaler regimen on discharge.  TODAY-DAY OF DISCHARGE:  Subjective:   Christopher [name] today has no headache,no chest abdominal pain,no new weakness tingling or numbness, feels much better wants to go home today.   Objective:   Blood pressure 102/69, pulse 68, temperature 97.7 F (36.5 C), temperature source Oral, resp. rate 19, height 5\' 8"  (1.727 m), weight 65.7 kg (144 lb 13.5 oz), SpO2 97 %.  Intake/Output Summary (Last 24 hours) at 12/29/14 1014 Last data filed at 12/29/14 0625  Gross per 24 hour  Intake 785.83 ml  Output   2030 ml  Net -1244.17 ml   Filed Weights   12/27/14 1858 12/28/14 0500 12/29/14 0357  Weight: 65 kg (143 lb 4.8 oz) 65.182 kg (143 lb 11.2 oz) 65.7 kg (144 lb 13.5 oz)    Exam Awake Alert, Oriented *3, No new F.N deficits, Normal affect [place].AT,PERRAL Supple Neck,No JVD, No cervical lymphadenopathy appriciated.  Symmetrical Chest wall movement, Good air movement bilaterally, CTAB RRR,No Gallops,Rubs or new Murmurs, No Parasternal Heave +ve B.Sounds, Abd Soft, Non tender, No organomegaly appriciated, No rebound -guarding or rigidity. No Cyanosis, Clubbing or edema, No new Rash or bruise  DISCHARGE CONDITION: Stable  DISPOSITION: Home   DISCHARGE INSTRUCTIONS:    Activity:  As tolerated   Get Medicines reviewed and adjusted: Please take all your medications with you for your next visit with your Primary MD  Please request your Primary MD to go over all hospital tests and procedure/radiological results at the follow up, please ask your Primary MD to get all Hospital records sent to his/her office.  If you experience worsening of your admission symptoms, develop shortness of breath, life threatening emergency, suicidal or homicidal thoughts you must seek medical attention immediately by calling 911 or calling your MD  immediately  if symptoms less severe.  You must read complete instructions/literature along with all the possible adverse reactions/side effects for all the Medicines you take and that have been prescribed to you. Take any new Medicines after you have completely understood and accpet all the possible adverse reactions/side effects.   Do not drive when taking Pain medications.   Do not take more than prescribed Pain, Sleep and Anxiety Medications  Special Instructions: If you have smoked or chewed Tobacco  in the last 2 yrs please stop smoking, stop any regular Alcohol  and or any Recreational drug use.  Wear Seat belts while driving.  Please note  You were cared for by a hospitalist during your hospital stay. Once you are discharged, your primary care physician will handle any further  medical issues. Please note that NO REFILLS for any discharge medications will be authorized once you are discharged, as it is imperative that you return to your primary care physician (or establish a relationship with a primary care physician if you do not have one) for your aftercare needs so that they can reassess your need for medications and monitor your lab values.   Diet recommendation: Heart Healthy diet  Discharge Instructions    Amb Referral to Cardiac Rehabilitation    Complete by:  As directed   Diagnosis:   Myocardial Infarction PCI       Call MD for:  persistant nausea and vomiting    Complete by:  As directed      Call MD for:  severe uncontrolled pain    Complete by:  As directed      Diet - low sodium heart healthy    Complete by:  As directed      Increase activity slowly    Complete by:  As directed            Follow-up Information    Follow up with Ezzard Standing, MD. Schedule an appointment as soon as possible for a visit in 1 week.   Specialty:  Cardiology   Contact information:   [address] [place] [address] [phone]       Follow up with  Minerva Ends, MD. Schedule an appointment as soon as possible for a visit in 1 week.   Specialty:  Family Medicine   Why:  Hospital follow up   Contact information:   [address] [place] [address] [phone]      Total Time spent on discharge equals  45 minutes.  SignedOren Binet 12/29/2014 10:14 AM

## 2014-12-29 NOTE — Progress Notes (Signed)
CM received consult regarding Brilinta. CM provided pt with Brilinta booklet however pt states has already used the 30 day free card in the  past and the manufacturer only allows a one time usage. Benefits check:  [employer] @ [employer] # [phone]   BRILINTA 90 MG BID FOR 30 DAY SUPPLY   COVER - YES  CO-PAY- 10 %  TIER- 4 DRUG  PRIOR APPROVAL - YES # [phone]  PHARMACY- [facility], [facility] AND [facility]       Previous     CM made pt aware and pt stated he wouldn't be able to afford the copay cost for Brilinta. CM made hospitalist and cardiologist (DR.Wynonia Lawman) aware. CM scheduled f/u appointment with [facility] on 01/17/15 @ 3:00PM with PCP , Dr.Funchus. [facility] pharmacy will apply for Brilinta for pt though the manufacturer's patient assistance program with obtaining authorization from [place] @ hospital f/u appointment 01/17/15 @ 3:00pm. However once application is filed it can take up to 6-8 weeks for the approval and dispensing of medication from the manufacturer. CM made cardiologist aware . CM asked cardiologist for sample supply of medication to help bridge until medication approval through MAP program is granted. Dr Wynonia Lawman stated he would give pt samples and asked if  CM would have pt to stop by his office once d/c for pick. CM made pt aware. Whitman Hero RN,BSN,CM [phone]

## 2014-12-29 NOTE — Progress Notes (Signed)
CARDIAC REHAB PHASE I   PRE:  Rate/Rhythm: 86 SR  BP:  Supine:   Sitting: 102/69  Standing:    SaO2:   MODE:  Ambulation: 800 ft   POST:  Rate/Rhythm: 81 SR  BP:  Supine: 123/75  Sitting:   Standing:    SaO2:  0905-1015 Pt walked 800 ft with steady gait. No  CP. Pt is very anxious and talked majority of our visit. Tried to give as much emotional support as possible with pt concerned about housing and finances. Stressed importance of antiplatelet with stent. Case manager in to see pt. Discussed MI restrictions, NTG use, signs of CHF and importance of daily weights and watching sodium. Pt knows he is to keep sodium to 2000 mg a day but with finances he has a hard time buying foods low in sodium. Does not have scales at home. Left message for CHF Navigator to see if she can get scales. Gave pt fake cigarette and discussed smoking cessation. Referring to CRP 2 in [name]. Pt stated he will have new insurance in January and hopefully coverage available for this program. Will let [place] CRP 2 follow up.  Gave CHF booklet and reviewed when to call MD with symptoms.   [name], RN BSN  12/29/2014 10:08 AM

## 2014-12-29 NOTE — Progress Notes (Signed)
Subjective:  Appreciate Dr. Evette Georges help.  Stable post stenting.   Objective:  Vital Signs in the last 24 hours: BP 93/68 mmHg  Pulse 77  Temp(Src) 97.6 F (36.4 C) (Oral)  Resp 15  Ht 5\' 8"  (1.727 m)  Wt 65.7 kg (144 lb 13.5 oz)  BMI 22.03 kg/m2  SpO2 97%  Physical Exam: Anxious WM in NAD Lungs:  Clear Cardiac:  Regular rhythm, normal S1 and S2, no S3 Extremities:  Radial cath site clean and dry  Intake/Output from previous day: 11/29 0701 - 11/30 0700 In: 785.8 [P.O.:600; I.V.:185.8] Out: 2205 [Urine:2205]  Weight Filed Weights   12/27/14 1858 12/28/14 0500 12/29/14 0357  Weight: 65 kg (143 lb 4.8 oz) 65.182 kg (143 lb 11.2 oz) 65.7 kg (144 lb 13.5 oz)    Lab Results: Basic Metabolic Panel:  Recent Labs  12/28/14 0350 12/29/14 0517  NA 139 139  K 4.2 4.2  CL 107 106  CO2 27 26  GLUCOSE 114* 122*  BUN 16 16  CREATININE 1.10 1.12   CBC:  Recent Labs  12/28/14 0350 12/29/14 0517  WBC 8.2 7.3  HGB 14.7 14.2  HCT 43.3 42.3  MCV 95.0 94.4  PLT 86* 88*   Cardiac Enzymes: Troponin (Point of Care Test)  Recent Labs  12/27/14 1403  TROPIPOC 1.05*   Cardiac Panel (last 3 results)  Recent Labs  12/27/14 1838 12/28/14 0020 12/28/14 0620  TROPONINI 2.88* 3.08* 3.06*    Telemetry: Sinus    Assessment/Plan:  1. Non STEMI with stenting of the circumflex marginal branch 2. Ischemic cardiomyopathy 3. Thrombocytopenia  Plan:  Okay for discharge today.  Strongly advise cardiac rehabilitation.  Talked about importance of stopping smoking.  He should be discharged on [place].  Will need case management evaluation as his social situation and financial situation is dire.      Kerry Hough  MD [facility] Cardiology  12/29/2014, 9:29 AM

## 2014-12-29 NOTE — Progress Notes (Signed)
Heart Failure Navigator Consult Note  Presentation: Christopher [name] is a 54 y.o. male with coronary artery disease s/p PCI 123456, chronic systolic congestive heart failure / cardiomyopathy with AICD. Patient was just discharged home from the hospital 3 days ago after admission for chest pain. Troponins were normal . Myoview c/w prior infarct , no inducible ischemia, severely depressed systolic function with LVEF < 30%. Discharged home with recommendations to follow up with Cardiology ASAP. Home cardiac meds were continued.  Patient brought by EMS to ED today for chest pain, worse than when admitted a few days ago. Pain resolved in route after full asa and one SL NTG. Patient states he has difficulty performing even small tasks such as combing his hair, brushing his teeth without having chest pain. The pain starts in left chest, radiates across her right chest and then down into both arms and hands. Sometimes he has the same type pain in his back and legs. The sharp pain usually last 1 minute or so, it can be crippling and he does have associated shortness of breath.    Past Medical History  Diagnosis Date  . Automatic implantable cardioverter-defibrillator in situ     MDT Aug 2015 Dr. Lovena Le  . CHF (congestive heart failure) ([place]) 05/2013   . Myocardial infarction ([place]) 04/29/2013  . CAD (coronary artery disease)     PCI to LAD with DES May 2015  . Heavy smoker   . Hyperlipidemia   . COPD (chronic obstructive pulmonary disease) ([place])   . Chronic kidney disease   . Thrombocytopenia ([place]) 12/27/2014    Chronic    . Chronic systolic CHF (congestive heart failure) (HCC)     ECHO 08/05/13  EF 30%  Anterior akinesis and inferior hypokinesis   . CAD (coronary artery disease), native coronary artery 07/03/2013    Cath 06/20/13  Normal left main, occluded LAD, occluded RCA, 50% circ EF 15%  3.0 x28 and 3.0 x 8 mm Xience stent Dr. Tamala Julian  To LAD     Social History   Social History  . Marital Status:  Single    Spouse Name: N/A  . Number of Children: 0  . Years of Education: 12   Occupational History  . Unemployed     Social History Main Topics  . Smoking status: Former Smoker -- 1.50 packs/day for 30 years    Types: Cigarettes    Quit date: 12/22/2014  . Smokeless tobacco: Never Used  . Alcohol Use: No     Comment: quit in 08/2013   . Drug Use: No  . Sexual Activity: Not Currently    Birth Control/ Protection: Condom     Comment: men    Other Topics Concern  . None   Social History Narrative   Lives alone.    In a house trying to sell his home. Exercise: No.    ECHO:Study Conclusions  - Left ventricle: Systolic function was severely reduced. The estimated ejection fraction was in the range of 20% to 25%. Diffuse hypokinesis. There is akinesis of the apicalanteroseptal myocardium. There is akinesis of the entireapical myocardium.  Impressions:  - There is no evidence of apical thrombus by definity contrast study but the entire apex is akinetic.  Cardiac catheterization--12/28/14  Mid RCA lesion, 100% stenosed.  2nd Mrg lesion, 95% stenosed. Post intervention, there is a 0% residual stenosis.  There is severe left ventricular systolic dysfunction.  Severe global LV dysfunction with a dilated left ventricle and ejection fraction of approximately  10%.  Widely patent stent in the proximal LAD and a otherwise normal LAD with distal septal collaterals supplying the distal RCA PDA system; large left circumflex coronary artery with a very large OM 2 branch that had 95% eccentric ulcerated plaque, proximal 99% RCA stenosis followed by a focal aneurysm prior to the RV marginal branch takeoff with total occlusion of the RCA after the marginal branch.  Successful PCI to the large OM 2 vessel with PTCA/DES stenting with insertion of a 3.515 mm Xience Alpine DES stent postdilated to 3.81 mm with the 95% stenosis being reduced to 0% and with brisk TIMI-3 flow without  evidence for dissection.  BNP No results found for: BNP  ProBNP No results found for: PROBNP   Education Assessment and Provision:  Detailed education and instructions provided on heart failure disease management including the following:  Signs and symptoms of Heart Failure When to call the physician Importance of daily weights Low sodium diet Fluid restriction Medication management Anticipated future follow-up appointments  Patient education given on each of the above topics.  Patient acknowledges understanding and acceptance of all instructions.  I spoke briefly with Christopher [name] after being contact by Christopher [name] Patient in Cardiac Rehab regarding a scale.  Unfortunately I do not have access to a scale before Christopher [name] is discharged.  I offered to send him one through the mail--however he says that he has a PO Box and "someone will steal it".  He assures me that he will buy a "cheap $10 dollar scale as soon as he can".  We briefly reviewed what happened while he was hospitalized and the results of his echo and cardiac cath in terms of his heart function.  He understands the importance of daily weights.  He can teach back when to contact the physician related to signs and symptoms of HF and weight gains.  I reviewed a low sodium diet and high sodium foods to avoid.  He definitely has some major financial difficulties and even may be evicted as he returns home today.  He has worked with case management prior to my arrival regarding getting his medications.  He will follow with Dr. Wynonia Lawman as an outpatient.  Education Materials:  "Living Better With Heart Failure" Booklet, Daily Weight Tracker Tool  High Risk Criteria for Readmission and/or Poor Patient Outcomes:  (Recommend Follow-up with Advanced Heart Failure Clinic)--he would be a great candidate for AHF Clinic referral for additional support through outpatient social work.   EF <30%- yes-  20-25% per echo --10% per cardiac cath  2 or more  admissions in 6 months- Yes  Difficult social situation- yes--significant financial difficulties   Demonstrates medication noncompliance- ? Denies    Barriers of Care:  Health Literacy, knowledge and compliance--? Ability to manage all self-care and social needs at this time  Discharge Planning:   Plans to return home to apartment alone if he is able.(not evicted).  He will need ongoing education regarding his HF and social support to assist with financial resources.Marland Kitchen

## 2015-01-06 ENCOUNTER — Encounter: Payer: Self-pay | Admitting: Cardiology

## 2015-01-06 NOTE — Progress Notes (Deleted)
Patient ID: Christopher [name], male   DOB: 1960-07-03, 54 y.o.   MRN: [id number]   Tito Dine    Date of visit:  01/06/2015 DOB:  06/14/1964    Age:  26 yrs. Medical record number:  [id number]     Account number:  [id number] Primary Care Provider: HOPPER,DAVID ____________________________ CURRENT DIAGNOSES  1. Chest pain  2. Hyperlipidemia  3. Essential hypertension  4. Obesity ____________________________ ALLERGIES  No Known Allergies ____________________________ MEDICATIONS  1. amlodipine 10 mg tablet, 1 p.o. daily  2. hydrochlorothiazide 25 mg tablet, 1 p.o. daily  3. pravastatin 40 mg tablet, 1 p.o. daily  4. Analpram-HC 2.5 %-1 % rectal cream, Take as directed  5. methocarbamol 500 mg tablet, BID  6. multivitamin tablet, 1 p.o. daily  7. Lamisil 250 mg tablet, 1 p.o. daily  8. ibuprofen 800 mg tablet, PRN ____________________________ CHIEF COMPLAINTS  Chest pain  worse with chest movement on the way home from gym ____________________________ HISTORY OF PRESENT ILLNESS This very nice 54 year old African American male is seen for evaluation of chest pain. The patient has a history of hypertension, obesity and hyperlipidemia. He previously was able to workout on a regular basis without symptoms. He presented to his primary doctor on 10 November with chest tightness and shortness of breath that lasted around 3 minutes. He was described as a throbbing pain in his substernal area and is not associated with sweating or shortness of breath. It may have been mildly pleuritic. He was driving home after having exercise and had to pull over for a while and then saw Dr. Jodi Mourning. He has not had any recurrent pain since that period he denies PND, orthopnea, syncope, palpitations or claudication. Blood pressure was somewhat elevated and his amlodipine was increased. The chest wall pain was treated with ibuprofen and has improved. I was asked to see him for evaluation. ____________________________ PAST  HISTORY  Past Medical Illnesses:  hypertension, hyperlipidemia, obesity;  Cardiovascular Illnesses:  no previous history of cardiac disease.;  Surgical Procedures:  no prior surgical procedures;  NYHA Classification:  I;  Canadian Angina Classification:  Class 0: Asymptomatic;  Cardiology Procedures-Invasive:  no history of prior cardiac procedures;  Cardiology Procedures-Noninvasive:  no previous non-invasive procedures;  LVEF not documented,   ____________________________ CARDIO-PULMONARY TEST DATES EKG Date:  01/06/2015;   ____________________________ FAMILY HISTORY Brother -- Brother alive and well Brother -- Brother alive and well Father -- Father dead, Alzheimers disease Mother -- Mother alive and well Sister -- Sister alive and well Sister -- Sister alive and well ____________________________ SOCIAL HISTORY Alcohol Use:  occasionally;  Smoking:  never smoked;  Diet:  regular diet;  Lifestyle:  married;  Exercise:  gym;  Occupation:  Orthoptist;  Residence:  lives with wife and children;   ____________________________ REVIEW OF SYSTEMS General:  obesity  Integumentary:no rashes or new skin lesions. Eyes: wears eye glasses/contact lenses Ears, Nose, Throat, Mouth:  denies any hearing loss, epistaxis, hoarseness or difficulty speaking. Respiratory: denies dyspnea, cough, wheezing or hemoptysis. Cardiovascular:  please review HPI Abdominal: denies dyspepsia, GI bleeding, constipation, or diarrhea Genitourinary-Male: no dysuria, urgency, frequency, or nocturia  Musculoskeletal:  denies arthritis, venous insufficiency, or muscle weakness Neurological:  denies headaches, stroke, or TIA Hematological/Immunologic:  denies any food allergies, bleeding disorders. ____________________________ PHYSICAL EXAMINATION VITAL SIGNS  Blood Pressure:  144/70 Sitting, Left arm, large cuff  , 128/84 Standing, Left arm and large cuff   Pulse:  84/min. Weight:  200.00 lbs. Height:  64"BMI:  34  Constitutional:  pleasant African Americian male in no acute distress, mildly obese Skin:  warm and dry to touch, no apparent skin lesions, or masses noted. Head:  normocephalic, normal hair pattern, no masses or tenderness Eyes:  EOMS Intact, PERRLA, C and S clear, Funduscopic exam not done. ENT:  ears, nose and throat reveal no gross abnormalities.  Dentition good. Neck:  supple, without massess. No JVD, thyromegaly or carotid bruits. Carotid upstroke normal. Chest:  normal symmetry, clear to auscultation. Cardiac:  regular rhythm, normal S1 and S2, No S3 or S4, no murmurs, gallops or rubs detected. Abdomen:  abdomen soft,non-tender, no masses, no hepatospenomegaly, or aneurysm noted Peripheral Pulses:  the femoral,dorsalis pedis, and posterior tibial pulses are full and equal bilaterally with no bruits auscultated. Extremities & Back:  no deformities, clubbing, cyanosis, erythema or edema observed. Normal muscle strength and tone. Neurological:  no gross motor or sensory deficits noted, affect appropriate, oriented x3. ____________________________ MOST RECENT LIPID PANEL 12/13/14  CHOL TOTL 224 mg/dl, LDL 145 NM, HDL 37 mg/dl, TRIGLYCER 211 mg/dl and CHOL/HDL 6.1 (Calc) ____________________________ IMPRESSIONS/PLAN  1. Isolated episode of chest pain that has now resolved 2. Hypertension borderline control 3. Hyperlipidemia under treatment 4. Obesity with need to lose weight  Recommendations:  Chest pain has some atypical features but has multiple risk factors. He has an abnormal EKG with right axis deviation. There were no ischemic abnormalities noted. I recommended that he have an echocardiogram because of the right axis deviation and also that he have a standard exercise treadmill test. We talked about the importance of weight loss. Thank you asking me to see him with you.   ____________________________ TODAYS ORDERS  1. treadmill:  Regular TM At [facility]  2.  2D, color flow, doppler: First Available  3. 12 Lead EKG: Today                       ____________________________ Cardiology Physician:  Kerry Hough MD [facility]

## 2015-01-17 ENCOUNTER — Encounter: Payer: Self-pay | Admitting: Family Medicine

## 2015-01-17 ENCOUNTER — Ambulatory Visit: Payer: 59 | Attending: Family Medicine | Admitting: Family Medicine

## 2015-01-17 VITALS — BP 115/79 | HR 68 | Temp 97.4°F | Resp 16 | Ht 68.0 in | Wt 150.0 lb

## 2015-01-17 DIAGNOSIS — F411 Generalized anxiety disorder: Secondary | ICD-10-CM | POA: Insufficient documentation

## 2015-01-17 DIAGNOSIS — Z79899 Other long term (current) drug therapy: Secondary | ICD-10-CM | POA: Diagnosis not present

## 2015-01-17 DIAGNOSIS — K0889 Other specified disorders of teeth and supporting structures: Secondary | ICD-10-CM | POA: Insufficient documentation

## 2015-01-17 DIAGNOSIS — I25110 Atherosclerotic heart disease of native coronary artery with unstable angina pectoris: Secondary | ICD-10-CM

## 2015-01-17 DIAGNOSIS — I259 Chronic ischemic heart disease, unspecified: Secondary | ICD-10-CM | POA: Insufficient documentation

## 2015-01-17 DIAGNOSIS — I2510 Atherosclerotic heart disease of native coronary artery without angina pectoris: Secondary | ICD-10-CM | POA: Diagnosis not present

## 2015-01-17 DIAGNOSIS — I5022 Chronic systolic (congestive) heart failure: Secondary | ICD-10-CM | POA: Diagnosis not present

## 2015-01-17 DIAGNOSIS — Z7982 Long term (current) use of aspirin: Secondary | ICD-10-CM | POA: Insufficient documentation

## 2015-01-17 DIAGNOSIS — I517 Cardiomegaly: Secondary | ICD-10-CM | POA: Diagnosis not present

## 2015-01-17 DIAGNOSIS — D696 Thrombocytopenia, unspecified: Secondary | ICD-10-CM | POA: Insufficient documentation

## 2015-01-17 DIAGNOSIS — I252 Old myocardial infarction: Secondary | ICD-10-CM | POA: Insufficient documentation

## 2015-01-17 DIAGNOSIS — Z1159 Encounter for screening for other viral diseases: Secondary | ICD-10-CM | POA: Insufficient documentation

## 2015-01-17 DIAGNOSIS — Z87891 Personal history of nicotine dependence: Secondary | ICD-10-CM | POA: Insufficient documentation

## 2015-01-17 DIAGNOSIS — E875 Hyperkalemia: Secondary | ICD-10-CM | POA: Insufficient documentation

## 2015-01-17 HISTORY — DX: Generalized anxiety disorder: F41.1

## 2015-01-17 LAB — CBC
HEMATOCRIT: 43.7 % (ref 39.0–52.0)
Hemoglobin: 15.2 g/dL (ref 13.0–17.0)
MCH: 31.5 pg (ref 26.0–34.0)
MCHC: 34.8 g/dL (ref 30.0–36.0)
MCV: 90.5 fL (ref 78.0–100.0)
MPV: 10.5 fL (ref 8.6–12.4)
PLATELETS: 112 10*3/uL — AB (ref 150–400)
RBC: 4.83 MIL/uL (ref 4.22–5.81)
RDW: 14.9 % (ref 11.5–15.5)
WBC: 9.5 10*3/uL (ref 4.0–10.5)

## 2015-01-17 LAB — BASIC METABOLIC PANEL
BUN: 19 mg/dL (ref 7–25)
CHLORIDE: 103 mmol/L (ref 98–110)
CO2: 27 mmol/L (ref 20–31)
CREATININE: 1.11 mg/dL (ref 0.70–1.33)
Calcium: 9.7 mg/dL (ref 8.6–10.3)
Glucose, Bld: 95 mg/dL (ref 65–99)
POTASSIUM: 5.4 mmol/L — AB (ref 3.5–5.3)
Sodium: 140 mmol/L (ref 135–146)

## 2015-01-17 MED ORDER — CARVEDILOL 6.25 MG PO TABS: 6.2500 mg | ORAL_TABLET | Freq: Two times a day (BID) | ORAL | Status: DC

## 2015-01-17 MED ORDER — TICAGRELOR 90 MG PO TABS: 90.0000 mg | ORAL_TABLET | Freq: Two times a day (BID) | ORAL | Status: DC

## 2015-01-17 NOTE — Progress Notes (Signed)
HFU MI  Question about medication Rx Brilinta No pain today  No tobacco user x 30 days

## 2015-01-17 NOTE — Assessment & Plan Note (Signed)
A:  Thrombocytopenia since 05/2013, I suspect this is related to antiplatelet therapy. Screening HIV negative. Labs and exam does not suggest liver dysfunction, splenic sequestration or bone marrow dysfunction. Patient with significant CAD with recent NSTEMI in need of ongoing antiplatelet therapy.  P: Repeat CBC Hep C  Defer any change to antiplatelet therapy to cardiology

## 2015-01-17 NOTE — Assessment & Plan Note (Signed)
A: CAD with recent NSTEMI P: brilinta re-ordered and patient advised to apply for PASS (medication assistance) through the [facility] pharmacy

## 2015-01-17 NOTE — Assessment & Plan Note (Addendum)
Dental pain. Planning fillings. Letter of medical clearance written, see communications to review letter. I advised that patient can undergo dental procedure with local anesthesia. He does have higher bleeding risk due to thrombocytopenia. He cannot stop brilinta or ASA.

## 2015-01-17 NOTE — Patient Instructions (Addendum)
Christopher [name] was seen today for hospitalization follow-up and heart problem.  Diagnoses and all orders for this visit:  Thrombocytopenia ([place]) -     CBC -     Hepatitis C antibody, reflex  Chronic systolic CHF (congestive heart failure) (HCC) -     Basic Metabolic Panel -     carvedilol (COREG) 6.25 MG tablet; Take 1 tablet (6.25 mg total) by mouth 2 (two) times daily with a meal.  Need for hepatitis C screening test -     Hepatitis C antibody, reflex  Coronary artery disease involving native coronary artery of native heart with unstable angina pectoris (HCC) -     ticagrelor (BRILINTA) 90 MG TABS tablet; Take 1 tablet (90 mg total) by mouth 2 (two) times daily. -     carvedilol (COREG) 6.25 MG tablet; Take 1 tablet (6.25 mg total) by mouth 2 (two) times daily with a meal.  Anxiety state  Pain, dental   You will be called with lab results  Apply for PASS for brilinta this is done at the pharmacy   F/u in 3 months for   Dr. [name]

## 2015-01-17 NOTE — Progress Notes (Signed)
Patient ID: Christopher [name], male   DOB: [birth year], 54 y.o.   MRN: [id number]   Subjective:  Patient ID: Christopher [name], male    DOB: 07-Mar-1960  Age: 54 y.o. MRN: [id number]  CC: Hospitalization Follow-up and Heart Problem   HPI Christopher [name] presents for    1. HFU NSTEMI: patient hospitalized from 11/28-11/30/16 for NSTEMI in setting of systolic CHF and hx of heavy smoking. He had a cath that revealed:   Mid RCA lesion, 100% stenosed.  2nd Mrg lesion, 95% stenosed. Post intervention, there is a 0% residual stenosis.  There is severe left ventricular systolic dysfunction.  Severe global LV dysfunction with a dilated left ventricle and ejection fraction of approximately 10%.  He is compliant with all medications. He has quit smoking. He denies CP and SOB. He admits to anxiety and poor sleep. He has financial stressors and home stressors.   2. Dental work: he is requesting a letter stating it is safe for him to have fillings done. Fillings are to be done with local anaesthesia. He has R upper incisor pain. He has not oral swelling. No fever.   Social History  Substance Use Topics  . Smoking status: Former Smoker -- 1.50 packs/day for 30 years    Types: Cigarettes    Quit date: 12/22/2014  . Smokeless tobacco: Never Used  . Alcohol Use: No     Comment: quit in 08/2013     Outpatient Prescriptions Prior to Visit  Medication Sig Dispense Refill  . aspirin 81 MG chewable tablet Chew 1 tablet (81 mg total) by mouth daily. 30 tablet 5  . atorvastatin (LIPITOR) 80 MG tablet Take 1 tablet (80 mg total) by mouth daily at 6 PM. 30 tablet 5  . carvedilol (COREG) 3.125 MG tablet Take 1 tablet (3.125 mg total) by mouth 2 (two) times daily with a meal. 60 tablet 5  . digoxin (LANOXIN) 0.125 MG tablet Take 1 tablet (0.125 mg total) by mouth daily. 30 tablet 5  . lisinopril (PRINIVIL,ZESTRIL) 2.5 MG tablet Take 1 tablet (2.5 mg total) by mouth daily. 30 tablet 5  . ticagrelor (BRILINTA) 90  MG TABS tablet Take 1 tablet (90 mg total) by mouth 2 (two) times daily. 240 tablet 1  . albuterol (PROVENTIL HFA;VENTOLIN HFA) 108 (90 BASE) MCG/ACT inhaler Inhale 2 puffs into the lungs every 6 (six) hours as needed for wheezing or shortness of breath. (Patient not taking: Reported on 01/17/2015) 1 Inhaler 2  . nicotine (NICODERM CQ - DOSED IN MG/24 HOURS) 21 mg/24hr patch Place 1 patch (21 mg total) onto the skin daily. (Patient not taking: Reported on 01/17/2015) 28 patch 3   No facility-administered medications prior to visit.    ROS Review of Systems  Constitutional: Negative for fever, chills, fatigue and unexpected weight change.  HENT: Positive for dental problem.   Eyes: Negative for visual disturbance.  Respiratory: Negative for cough and shortness of breath.   Cardiovascular: Negative for chest pain, palpitations and leg swelling.  Gastrointestinal: Negative for nausea, vomiting, abdominal pain, diarrhea, constipation and blood in stool.  Endocrine: Negative for polydipsia, polyphagia and polyuria.  Musculoskeletal: Negative for myalgias, back pain, arthralgias, gait problem and neck pain.  Skin: Negative for rash.  Allergic/Immunologic: Negative for immunocompromised state.  Hematological: Negative for adenopathy. Does not bruise/bleed easily.  Psychiatric/Behavioral: Positive for sleep disturbance. Negative for suicidal ideas and dysphoric mood. The patient is nervous/anxious.     Objective:  BP 115/79 mmHg  Pulse  68  Temp(Src) 97.4 F (36.3 C) (Oral)  Resp 16  Ht 5\' 8"  (1.727 m)  Wt 150 lb (68.04 kg)  BMI 22.81 kg/m2  SpO2 98%  BP/Weight 01/17/2015 12/29/2014 [id number]  Systolic BP [id number]  Diastolic BP 79 69 86  Wt. (Lbs) 150 144.84 141.4  BMI 22.81 22.03 21.5   Physical Exam  Constitutional: He appears well-developed and well-nourished. No distress.  HENT:  Head: Normocephalic and atraumatic.  Neck: Normal range of motion. Neck supple.    Cardiovascular: Normal rate, regular rhythm, normal heart sounds and intact distal pulses.   Pulmonary/Chest: Effort normal and breath sounds normal.  Musculoskeletal: He exhibits no edema.  Neurological: He is alert.  Skin: Skin is warm and dry. No rash noted. No erythema.  Psychiatric: He has a normal mood and affect.   Assessment & Plan:   Problem List Items Addressed This Visit    Anxiety state (Chronic)    A; anxiety with financial stressors, health stressors and stressful living situation P: Increase coreg slightly with hope of improving anxiety symptoms and sleep I recommended therapy Patient prefers to avoid BZs which I agree with Avoid atarax due to risk of arrhythmia       CAD (coronary artery disease), native coronary artery (Chronic)    A: CAD with recent NSTEMI P: brilinta re-ordered and patient advised to apply for PASS (medication assistance) through the [facility] pharmacy       Relevant Medications   ticagrelor (BRILINTA) 90 MG TABS tablet   carvedilol (COREG) 6.25 MG tablet   Chronic systolic CHF (congestive heart failure) (HCC) (Chronic)    A: CAD, CHF and anxiety. Patient is normotensive. Followed by cardiology. He declines therapy.  P: Increase coreg from 3.125 to 6.25 mg BID Continue cardiology f/u CBC and BMP today       Relevant Medications   carvedilol (COREG) 6.25 MG tablet   Other Relevant Orders   Basic Metabolic Panel   Pain, dental    Dental pain. Planning fillings. Letter of medical clearance written, see communications to review letter. I advised that patient can undergo dental procedure with local anesthesia. He does have higher bleeding risk due to thrombocytopenia. He cannot stop brilinta or ASA.       Thrombocytopenia ([place]) - Primary (Chronic)    A:  Thrombocytopenia since 05/2013, I suspect this is related to antiplatelet therapy. Screening HIV negative. Labs and exam does not suggest liver dysfunction, splenic sequestration or bone  marrow dysfunction. Patient with significant CAD with recent NSTEMI in need of ongoing antiplatelet therapy.  P: Repeat CBC Hep C  Defer any change to antiplatelet therapy to cardiology        Relevant Orders   CBC   Hepatitis C antibody, reflex    Other Visit Diagnoses    Need for hepatitis C screening test        Relevant Orders    Hepatitis C antibody, reflex       No orders of the defined types were placed in this encounter.    Follow-up: No Follow-up on file.   [name] MD

## 2015-01-17 NOTE — Assessment & Plan Note (Signed)
A: CAD, CHF and anxiety. Patient is normotensive. Followed by cardiology. He declines therapy.  P: Increase coreg from 3.125 to 6.25 mg BID Continue cardiology f/u CBC and BMP today

## 2015-01-17 NOTE — Assessment & Plan Note (Signed)
A; anxiety with financial stressors, health stressors and stressful living situation P: Increase coreg slightly with hope of improving anxiety symptoms and sleep I recommended therapy Patient prefers to avoid BZs which I agree with Avoid atarax due to risk of arrhythmia

## 2015-01-18 DIAGNOSIS — E875 Hyperkalemia: Secondary | ICD-10-CM | POA: Insufficient documentation

## 2015-01-18 LAB — HEPATITIS C ANTIBODY: HCV AB: NEGATIVE

## 2015-01-18 NOTE — Addendum Note (Signed)
Addended by: [name] on: 01/18/2015 08:55 AM   Modules accepted: Orders

## 2015-01-28 NOTE — Progress Notes (Signed)
Patient ID: Christopher [name], male   DOB: Oct 11, 1960, 54 y.o.   MRN: [id number]   Angelos, Ottmar  Date of visit:  01/06/2015 DOB:  18-Jun-1960    Age:  54 yrs. Medical record number:  [id number]     Account number:  [id number] Primary Care Provider: SMITH,KRISTI ____________________________ CURRENT DIAGNOSES  1. CAD Native without angina  2. Atherosclerotic heart disease of native coronary artery with other forms of angina pectoris  3. Old myocardial infarction  4. Chronic systolic (congestive) heart failure  5. Hyperlipidemia  6. Presence of automatic (implantable) cardiac defibrillator  7. Other intervertebral disc degeneration, lumbar region  8. Presence of coronary angioplasty implant and graft ____________________________ ALLERGIES  Celebrex, Intolerance-unknown ____________________________ MEDICATIONS  1. aspirin 81 mg chewable tablet, 1 p.o. daily  2. carvedilol 3.125 mg tablet, BID  3. atorvastatin 80 mg tablet, 1 p.o. daily  4. lisinopril 2.5 mg tablet, 1 p.o. daily  5. digoxin 125 mcg tablet, 1 p.o. daily  6. Brilinta 90 mg tablet, BID ____________________________ CHIEF COMPLAINTS  Followup of CAD Native without angina ____________________________ HISTORY OF PRESENT ILLNESS Patient seen in cardiac followup. He was seen over Thanksgiving and had a nuclear stress test by my coverage group. He came back in with recurrent chest pain and had a non-STEMI. He underwent catheterization and was found to have a severe ulcerated plaque involving the circumflex marginal that was stented. He has done well since then without recurrent chest pain. He has successfully been able to quit smoking. He continues to have a host of social issues related mainly to his living situation as well as financial problems. We had given him samples of Brilanta to try to help get him through this period. He denies angina at this time and is currently feeling better. No defibrillator  discharges. ____________________________ PAST HISTORY  Past Medical Illnesses:  hyperlipidemia, lumbar disc disease;  Cardiovascular Illnesses:  S/P MI-anterior May 2015, CAD, non STEMI November 2016;  Surgical Procedures:  cyst rt hand;  NYHA Classification:  II;  Canadian Angina Classification:  Class 0: Asymptomatic;  Cardiology Procedures-Invasive:  cardiac cath (left) May 2015, Xience Stent x 2 3.0 x28 and 3.0 x 8 mm to LAD dr. Tamala Julian 06/20/13, [employer]tronic AICD implant Dr. Lovena Le 09/07/13, cardiac cath (left) November 2016, 12/28/14 3.5 x 15 Xience Alipine DES to OM Dr. Claiborne Billings;  Cardiology Procedures-Noninvasive:  echocardiogram May 2015, echocardiogram July 2015;  Cardiac Cath Results:  normal Left main, patent stent to LAD, 95% stenosis CFX prox, occluded RCA, right to left collateral, left to right collateral, stent x 1 to OM;  LVEF of 20% documented via echocardiogram on 08/05/2013,   ____________________________ CARDIO-PULMONARY TEST DATES EKG Date:  07/03/2013;   Cardiac Cath Date:  06/20/2013;  Stent Placement Date: 06/20/2013;  Echocardiography Date: 08/05/2013;  Chest Xray Date: 06/20/2013;   ____________________________ FAMILY HISTORY Brother -- Brother alive and well Brother -- Brother alive and well Brother -- Brother alive and well Father -- Father dead, Pulmonary emphysema Mother -- Mother dead, CVA, Dementia/Alzheimers Sister -- Sister alive and well ____________________________ SOCIAL HISTORY Alcohol Use:  no alcohol use;  Smoking:  smokes, greater than 50 pack year history;  Lifestyle:  single;  Exercise:  no regular exercise;  Occupation:  unemployed;  Residence:  lives alone;   ____________________________ REVIEW OF SYSTEMS General:  denies recent weight change, fatique or change in exercise tolerance. Eyes: denies diplopia, history of glaucoma or visual problems. Ears, Nose, Throat, Mouth:  recent dental work Respiratory: mild  dyspnea with exertion Cardiovascular:  please  review HPI  Genitourinary-Male: no dysuria, urgency, frequency, or nocturia  Musculoskeletal:  chronic back pain Psychiatric:  anxiety, situational stress  ____________________________ PHYSICAL EXAMINATION VITAL SIGNS  Blood Pressure:  120/70 Sitting, Left arm, regular cuff  , 114/70 Standing, Left arm and regular cuff   Pulse:  72/min. Weight:  151.00 lbs. Height:  68"BMI: 23  Constitutional:  anxious talkative white male in no acute distress Skin:  warm and dry to touch, no apparent skin lesions, or masses noted. Head:  normocephalic, normal hair pattern, no masses or tenderness Neck:  supple, without massess. No JVD, thyromegaly or carotid bruits. Carotid upstroke normal. Chest:  normal symmetry, clear to auscultation. Cardiac:  regular rhythm, normal S1 and S2, No S3 or S4, no murmurs, gallops or rubs detected Peripheral Pulses:  the femoral,dorsalis pedis, and posterior tibial pulses are full and equal bilaterally with no bruits auscultated. Extremities & Back:  radial cath site clean and dry Neurological:  no gross motor or sensory deficits noted, affect appropriate, oriented x3. ____________________________ MOST RECENT LIPID PANEL 06/22/13  CHOL TOTL 151 mg/dl, LDL 85 NM, HDL 41 mg/dl, TRIGLYCER 126 mg/dl and CHOL/HDL 3.7 (Calc) ____________________________ IMPRESSIONS/PLAN  1. Recent non-STEMI with stenting of the circumflex marginal branch and patent stents in the LAD 2. Ischemic cardiomyopathy 3. Chronic systolic heart failure 4. Hyperlipidemia under treatment  Recommendations:  Congratulated him  on stopping smoking. Continue current medicines and he should have an adequate supply of Brilinta. Call if he needs more. Followup in 3 months. Call if problems. ____________________________ TODAYS ORDERS  1. Return Visit: 3 months                       ____________________________ Cardiology Physician:  Kerry Hough MD [facility]

## 2015-02-01 ENCOUNTER — Telehealth: Payer: Self-pay

## 2015-02-01 MED FILL — BRILINTA 90 MG TABLET: 90 | 30 days supply | Qty: 60 | Fill #0

## 2015-02-01 NOTE — Telephone Encounter (Signed)
-----   Message from [name], MD sent at 01/18/2015  8:54 AM EST ----- Screening hep C negative platelets improved to 112 Potassium slightly elevate at 5.4 on low dose lisinopril will need recheck in 2 weeks to trend potassium  No change in therapy now

## 2015-02-03 ENCOUNTER — Telehealth: Payer: Self-pay | Admitting: *Deleted

## 2015-02-03 NOTE — Telephone Encounter (Signed)
Patient verified DOB Patient informed of Hep C being Negative Platelets have improved to 112. Patient informed of Potassium level being slightly high at 5.4 at the low dose lisinopril. Patient informed of recheck being completed in two weeks to recheck potassium. Patient informed of no change in therapy.  Patients lab visit scheduled for Monday 02/07/15 at 9:00am Patient expressed his understanding and had no further questions.

## 2015-02-03 NOTE — Telephone Encounter (Signed)
-----   Message from [name], MD sent at 01/18/2015  8:54 AM EST ----- Screening hep C negative platelets improved to 112 Potassium slightly elevate at 5.4 on low dose lisinopril will need recheck in 2 weeks to trend potassium  No change in therapy now

## 2015-02-03 NOTE — Telephone Encounter (Signed)
LVM to return call.

## 2015-02-07 ENCOUNTER — Other Ambulatory Visit: Payer: Self-pay

## 2015-02-09 ENCOUNTER — Ambulatory Visit: Payer: BLUE CROSS/BLUE SHIELD | Attending: Family Medicine

## 2015-02-09 DIAGNOSIS — E875 Hyperkalemia: Secondary | ICD-10-CM

## 2015-02-09 LAB — BASIC METABOLIC PANEL
BUN: 17 mg/dL (ref 7–25)
CALCIUM: 9.1 mg/dL (ref 8.6–10.3)
CO2: 27 mmol/L (ref 20–31)
CREATININE: 1.13 mg/dL (ref 0.70–1.33)
Chloride: 106 mmol/L (ref 98–110)
GLUCOSE: 107 mg/dL — AB (ref 65–99)
Potassium: 4.8 mmol/L (ref 3.5–5.3)
SODIUM: 140 mmol/L (ref 135–146)

## 2015-02-11 ENCOUNTER — Telehealth: Payer: Self-pay | Admitting: *Deleted

## 2015-02-11 NOTE — Telephone Encounter (Signed)
Date of birth verified by pt  Normal results given  Pt verbalized understanding

## 2015-02-11 NOTE — Telephone Encounter (Signed)
error 

## 2015-02-11 NOTE — Telephone Encounter (Signed)
-----   Message from [name], MD sent at 02/10/2015 11:04 AM EST ----- Normal BMP

## 2015-03-14 MED FILL — BRILINTA 90 MG TABLET: 90 | 30 days supply | Qty: 60 | Fill #1

## 2015-04-07 ENCOUNTER — Encounter: Payer: Self-pay | Admitting: Cardiology
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
## 2015-09-29 ENCOUNTER — Encounter: Payer: Self-pay | Admitting: Internal Medicine

## 2015-09-29 ENCOUNTER — Ambulatory Visit (INDEPENDENT_AMBULATORY_CARE_PROVIDER_SITE_OTHER): Payer: BLUE CROSS/BLUE SHIELD | Admitting: Internal Medicine

## 2015-09-29 VITALS — BP 120/82 | HR 69 | Ht 68.0 in | Wt 158.4 lb

## 2015-09-29 DIAGNOSIS — I5022 Chronic systolic (congestive) heart failure: Secondary | ICD-10-CM

## 2015-09-29 LAB — CUP PACEART INCLINIC DEVICE CHECK
Battery Remaining Longevity: 123 mo
Battery Voltage: 3.01 V
Date Time Interrogation Session: 20170831131306
HIGH POWER IMPEDANCE MEASURED VALUE: 65 Ohm
Implantable Lead Location: 753860
Implantable Lead Model: 6935
Lead Channel Impedance Value: 361 Ohm
Lead Channel Impedance Value: 475 Ohm
Lead Channel Setting Pacing Pulse Width: 0.4 ms
Lead Channel Setting Sensing Sensitivity: 0.3 mV
MDC IDC LEAD IMPLANT DT: 20150810
MDC IDC MSMT LEADCHNL RV PACING THRESHOLD AMPLITUDE: 0.5 V
MDC IDC MSMT LEADCHNL RV PACING THRESHOLD PULSEWIDTH: 0.4 ms
MDC IDC MSMT LEADCHNL RV SENSING INTR AMPL: 10.375 mV
MDC IDC SET LEADCHNL RV PACING AMPLITUDE: 2 V
MDC IDC STAT BRADY RV PERCENT PACED: 0.01 %

## 2015-09-29 NOTE — Patient Instructions (Signed)
Medication Instructions:  Your physician recommends that you continue on your current medications as directed. Please refer to the Current Medication list given to you today.   Labwork: None ordered   Testing/Procedures: None ordered   Follow-Up: Your physician wants you to follow-up in: 12 months with Dr Taylor You will receive a reminder letter in the mail two months in advance. If you don't receive a letter, please call our office to schedule the follow-up appointment.   Remote monitoring is used to monitor your ICD from home. This monitoring reduces the number of office visits required to check your device to one time per year. It allows us to keep an eye on the functioning of your device to ensure it is working properly. You are scheduled for a device check from home on 12/29/15. You may send your transmission at any time that day. If you have a wireless device, the transmission will be sent automatically. After your physician reviews your transmission, you will receive a postcard with your next transmission date.    Any Other Special Instructions Will Be Listed Below (If Applicable).     If you need a refill on your cardiac medications before your next appointment, please call your pharmacy.   
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
## 2015-12-26 ENCOUNTER — Telehealth: Payer: Self-pay

## 2015-12-26 NOTE — Telephone Encounter (Signed)
Spoke with patient who called in concerned about multiple home checks. I canceled his home check for Dr. Lovena Le as he is following with Dr. Wynonia Lawman.

## 2016-01-10 DIAGNOSIS — R109 Unspecified abdominal pain: Secondary | ICD-10-CM | POA: Diagnosis not present

## 2016-01-10 DIAGNOSIS — Z95810 Presence of automatic (implantable) cardiac defibrillator: Secondary | ICD-10-CM | POA: Diagnosis not present

## 2016-01-10 DIAGNOSIS — E785 Hyperlipidemia, unspecified: Secondary | ICD-10-CM | POA: Diagnosis not present

## 2016-01-10 DIAGNOSIS — I2510 Atherosclerotic heart disease of native coronary artery without angina pectoris: Secondary | ICD-10-CM | POA: Diagnosis not present

## 2016-01-10 DIAGNOSIS — Z955 Presence of coronary angioplasty implant and graft: Secondary | ICD-10-CM | POA: Diagnosis not present

## 2016-01-10 DIAGNOSIS — I5022 Chronic systolic (congestive) heart failure: Secondary | ICD-10-CM | POA: Diagnosis not present

## 2016-01-10 DIAGNOSIS — M5136 Other intervertebral disc degeneration, lumbar region: Secondary | ICD-10-CM | POA: Diagnosis not present

## 2016-01-10 DIAGNOSIS — I252 Old myocardial infarction: Secondary | ICD-10-CM | POA: Diagnosis not present

## 2016-01-11 ENCOUNTER — Other Ambulatory Visit: Payer: Self-pay | Admitting: Cardiology

## 2016-01-11 DIAGNOSIS — R1084 Generalized abdominal pain: Secondary | ICD-10-CM

## 2016-01-18 ENCOUNTER — Ambulatory Visit
Admission: RE | Admit: 2016-01-18 | Discharge: 2016-01-18 | Disposition: A | Discharge status: Home / Self Care | Payer: Medicare Other | Source: Ambulatory Visit | Attending: Cardiology | Admitting: Cardiology

## 2016-01-18 DIAGNOSIS — R1084 Generalized abdominal pain: Secondary | ICD-10-CM

## 2016-01-18 DIAGNOSIS — N281 Cyst of kidney, acquired: Secondary | ICD-10-CM | POA: Diagnosis not present

## 2016-02-21 DIAGNOSIS — Z955 Presence of coronary angioplasty implant and graft: Secondary | ICD-10-CM | POA: Diagnosis not present

## 2016-02-21 DIAGNOSIS — M5136 Other intervertebral disc degeneration, lumbar region: Secondary | ICD-10-CM | POA: Diagnosis not present

## 2016-02-21 DIAGNOSIS — I252 Old myocardial infarction: Secondary | ICD-10-CM | POA: Diagnosis not present

## 2016-02-21 DIAGNOSIS — E785 Hyperlipidemia, unspecified: Secondary | ICD-10-CM | POA: Diagnosis not present

## 2016-02-21 DIAGNOSIS — Z95810 Presence of automatic (implantable) cardiac defibrillator: Secondary | ICD-10-CM | POA: Diagnosis not present

## 2016-02-21 DIAGNOSIS — I2510 Atherosclerotic heart disease of native coronary artery without angina pectoris: Secondary | ICD-10-CM | POA: Diagnosis not present

## 2016-02-21 DIAGNOSIS — R109 Unspecified abdominal pain: Secondary | ICD-10-CM | POA: Diagnosis not present

## 2016-02-21 DIAGNOSIS — I5022 Chronic systolic (congestive) heart failure: Secondary | ICD-10-CM | POA: Diagnosis not present

## 2016-03-23 ENCOUNTER — Encounter: Payer: Self-pay | Admitting: Cardiology

## 2016-03-26 ENCOUNTER — Telehealth: Payer: Self-pay | Admitting: Internal Medicine

## 2016-03-26 NOTE — Telephone Encounter (Signed)
Patient states he only has device checks yearly.  A statement has been received that we were unable to contact the patient because they have moved.  Pleas call so this can be straightened out.

## 2016-03-26 NOTE — Telephone Encounter (Signed)
Spoke w/ pt and informed him that we had it noted that he follows w/ Dr. Wynonia Lawman and only w/ [facility] once a year. Pt verbalized understanding.

## 2016-04-26 DIAGNOSIS — I252 Old myocardial infarction: Secondary | ICD-10-CM | POA: Diagnosis not present

## 2016-04-26 DIAGNOSIS — Z955 Presence of coronary angioplasty implant and graft: Secondary | ICD-10-CM | POA: Diagnosis not present

## 2016-04-26 DIAGNOSIS — I5022 Chronic systolic (congestive) heart failure: Secondary | ICD-10-CM | POA: Diagnosis not present

## 2016-04-26 DIAGNOSIS — M5136 Other intervertebral disc degeneration, lumbar region: Secondary | ICD-10-CM | POA: Diagnosis not present

## 2016-04-26 DIAGNOSIS — R109 Unspecified abdominal pain: Secondary | ICD-10-CM | POA: Diagnosis not present

## 2016-04-26 DIAGNOSIS — I2510 Atherosclerotic heart disease of native coronary artery without angina pectoris: Secondary | ICD-10-CM | POA: Diagnosis not present

## 2016-04-26 DIAGNOSIS — Z95810 Presence of automatic (implantable) cardiac defibrillator: Secondary | ICD-10-CM | POA: Diagnosis not present

## 2016-04-26 DIAGNOSIS — E785 Hyperlipidemia, unspecified: Secondary | ICD-10-CM | POA: Diagnosis not present

## 2016-04-26 DIAGNOSIS — R002 Palpitations: Secondary | ICD-10-CM | POA: Diagnosis not present

## 2016-04-29 DIAGNOSIS — K6389 Other specified diseases of intestine: Secondary | ICD-10-CM

## 2016-04-29 HISTORY — DX: Other specified diseases of intestine: K63.89

## 2016-05-03 ENCOUNTER — Encounter: Payer: Self-pay | Admitting: Family Medicine

## 2016-05-03 ENCOUNTER — Ambulatory Visit: Payer: Medicare Other | Attending: Family Medicine | Admitting: Family Medicine

## 2016-05-03 VITALS — BP 138/81 | HR 72 | Temp 97.9°F | Ht 68.0 in | Wt 157.4 lb

## 2016-05-03 DIAGNOSIS — Z79899 Other long term (current) drug therapy: Secondary | ICD-10-CM | POA: Insufficient documentation

## 2016-05-03 DIAGNOSIS — I252 Old myocardial infarction: Secondary | ICD-10-CM | POA: Diagnosis not present

## 2016-05-03 DIAGNOSIS — Z87891 Personal history of nicotine dependence: Secondary | ICD-10-CM | POA: Insufficient documentation

## 2016-05-03 DIAGNOSIS — D500 Iron deficiency anemia secondary to blood loss (chronic): Secondary | ICD-10-CM | POA: Insufficient documentation

## 2016-05-03 DIAGNOSIS — D509 Iron deficiency anemia, unspecified: Secondary | ICD-10-CM | POA: Diagnosis not present

## 2016-05-03 DIAGNOSIS — D649 Anemia, unspecified: Secondary | ICD-10-CM | POA: Diagnosis not present

## 2016-05-03 DIAGNOSIS — R109 Unspecified abdominal pain: Secondary | ICD-10-CM | POA: Insufficient documentation

## 2016-05-03 DIAGNOSIS — Z7902 Long term (current) use of antithrombotics/antiplatelets: Secondary | ICD-10-CM | POA: Insufficient documentation

## 2016-05-03 DIAGNOSIS — Z7982 Long term (current) use of aspirin: Secondary | ICD-10-CM | POA: Insufficient documentation

## 2016-05-03 DIAGNOSIS — K921 Melena: Secondary | ICD-10-CM | POA: Diagnosis not present

## 2016-05-03 HISTORY — DX: Iron deficiency anemia secondary to blood loss (chronic): D50.0

## 2016-05-03 LAB — HEMOCCULT GUIAC POC 1CARD (OFFICE): Fecal Occult Blood, POC: NEGATIVE

## 2016-05-03 MED ORDER — FERROUS SULFATE 325 (65 FE) MG PO TABS: 325.0000 mg | ORAL_TABLET | Freq: Two times a day (BID) | ORAL | 3 refills | Status: DC

## 2016-05-03 NOTE — Patient Instructions (Addendum)
Christopher [name] was seen today for anemia and abdominal pain.  Diagnoses and all orders for this visit:  Anemia, unspecified type -     Hemoccult - 1 Card (office) -     Iron and TIBC -     Ferritin -     ferrous sulfate 325 (65 FE) MG tablet; Take 1 tablet (325 mg total) by mouth 2 (two) times daily with a meal.  Melena -     CT Abdomen Pelvis Wo Contrast; Future -     Hemoccult - 1 Card (office) -     Ambulatory referral to Gastroenterology   f/u in 6 weeks for anemia  Dr. [name]

## 2016-05-03 NOTE — Progress Notes (Addendum)
Subjective:  Patient ID: Christopher [name], male    DOB: 10/02/1960  Age: 56 y.o. MRN: [id number]  CC: Anemia and Abdominal Pain   HPI Christopher [name] presents for   1. Normocytic anemia: His Hgb/Hct was 9.4/29.1. MVC 86.1 (normal) His platelets were 163. Cr 1.56.  TSH 1.17.  He had labs drawn at his cardiologist, Christopher [name],  office a few weeks ago. He was told he was anemic with iron deficiency. He has excessive gas for past 6 months. He has intermittent abdominal pains. He had an abdominal ultrasound in 12/2015 that revealed ectatic abdominal aorta and simple renal cyst with recommended f/u in 5 years.  He has been on antiplatelet therapy since NSTEMI in 11/2014 . He has notices blood in stool or on tissue paper. He is now taking Plavix and aspirin. He denies fever, chills and weight loss. He has never had a screening c-scope. He denies family history of colon cancer. He is a former smoker.   Social History  Substance Use Topics  . Smoking status: Former Smoker    Packs/day: 1.50    Years: 30.00    Types: Cigarettes    Quit date: 12/22/2014  . Smokeless tobacco: Never Used  . Alcohol use No     Comment: quit in 08/2013     Outpatient Medications Prior to Visit  Medication Sig Dispense Refill  . atorvastatin (LIPITOR) 80 MG tablet Take 1 tablet (80 mg total) by mouth daily at 6 PM. 30 tablet 5  . carvedilol (COREG) 6.25 MG tablet Take 1 tablet (6.25 mg total) by mouth 2 (two) times daily with a meal. 60 tablet 3  . digoxin (LANOXIN) 0.125 MG tablet Take 1 tablet (0.125 mg total) by mouth daily. 30 tablet 5  . lisinopril (PRINIVIL,ZESTRIL) 2.5 MG tablet Take 1 tablet (2.5 mg total) by mouth daily. 30 tablet 5  . albuterol (PROVENTIL HFA;VENTOLIN HFA) 108 (90 BASE) MCG/ACT inhaler Inhale 2 puffs into the lungs every 6 (six) hours as needed for wheezing or shortness of breath. (Patient not taking: Reported on 01/17/2015) 1 Inhaler 2  . aspirin 81 MG chewable tablet Chew 1 tablet (81 mg  total) by mouth daily. (Patient not taking: Reported on 05/03/2016) 30 tablet 5  . ticagrelor (BRILINTA) 90 MG TABS tablet Take 1 tablet (90 mg total) by mouth 2 (two) times daily. (Patient not taking: Reported on 05/03/2016) 180 tablet 3   No facility-administered medications prior to visit.     ROS Review of Systems  Constitutional: Positive for fatigue. Negative for chills, fever and unexpected weight change.  Eyes: Negative for visual disturbance.  Respiratory: Negative for cough and shortness of breath.   Cardiovascular: Negative for chest pain, palpitations and leg swelling.  Gastrointestinal: Positive for abdominal pain and blood in stool. Negative for constipation, diarrhea, nausea and vomiting.  Endocrine: Negative for polydipsia, polyphagia and polyuria.  Musculoskeletal: Negative for arthralgias, back pain, gait problem, myalgias and neck pain.  Skin: Negative for rash.  Allergic/Immunologic: Negative for immunocompromised state.  Hematological: Negative for adenopathy. Does not bruise/bleed easily.  Psychiatric/Behavioral: Negative for dysphoric mood, sleep disturbance and suicidal ideas. The patient is not nervous/anxious.     Objective:  BP 138/81   Pulse 72   Temp 97.9 F (36.6 C) (Oral)   Ht 5\' 8"  (1.727 m)   Wt 157 lb 6.4 oz (71.4 kg)   SpO2 100%   BMI 23.93 kg/m   BP/Weight 05/03/2016 09/29/2015 97/98/9211  Systolic BP 941  711 657  Diastolic BP 81 82 79  Wt. (Lbs) 157.4 158.4 150  BMI 23.93 24.08 22.81    Physical Exam  Constitutional: He appears well-developed and well-nourished. No distress.  HENT:  Head: Normocephalic and atraumatic.  Eyes:    Neck: Normal range of motion. Neck supple.  Cardiovascular: Normal rate, regular rhythm, normal heart sounds and intact distal pulses.   Pulmonary/Chest: Effort normal and breath sounds normal.  Abdominal: Soft. Bowel sounds are normal. He exhibits no distension and no mass. There is no tenderness. There is no  rebound and no guarding.  Genitourinary: Rectal exam shows guaiac negative stool.  Musculoskeletal: He exhibits no edema.  Lymphadenopathy:    He has no cervical adenopathy.    He has axillary adenopathy.       Right: No inguinal and no supraclavicular adenopathy present.       Left: No inguinal and no supraclavicular adenopathy present.  Neurological: He is alert.  Skin: Skin is warm and dry. No rash noted. No erythema.  Psychiatric: He has a normal mood and affect.     Assessment & Plan:  Christopher [name] was seen today for anemia and abdominal pain.  Diagnoses and all orders for this visit:  Anemia, unspecified type -     Hemoccult - 1 Card (office) -     Iron and TIBC -     Ferritin -     Discontinue: ferrous sulfate 325 (65 FE) MG tablet; Take 1 tablet (325 mg total) by mouth 2 (two) times daily with a meal. -     CT Abdomen Pelvis W Contrast; Future -     ferrous sulfate 325 (65 FE) MG tablet; Take 1 tablet (325 mg total) by mouth 2 (two) times daily with a meal.  Melena -     Cancel: CT Abdomen Pelvis Wo Contrast; Future -     Hemoccult - 1 Card (office) -     Ambulatory referral to Gastroenterology -     CT Abdomen Pelvis W Contrast; Future   There are no diagnoses linked to this encounter.  No orders of the defined types were placed in this encounter.   Follow-up: Return in about 6 weeks (around 06/14/2016) for anemia.   [name] MD

## 2016-05-03 NOTE — Assessment & Plan Note (Signed)
Anemia with abdominal pains and reported melena Plan: CT abdomen with contrast Oral iron therapy GI referral for colonoscopy, patient is resistant to colonoscopy

## 2016-05-04 ENCOUNTER — Encounter: Payer: Self-pay | Admitting: Nurse Practitioner

## 2016-05-04 LAB — IRON AND TIBC
IRON: 26 ug/dL — AB (ref 38–169)
Iron Saturation: 8 % — CL (ref 15–55)
TIBC: 324 ug/dL (ref 250–450)
UIBC: 298 ug/dL (ref 111–343)

## 2016-05-04 LAB — FERRITIN: FERRITIN: 10 ng/mL — AB (ref 30–400)

## 2016-05-09 ENCOUNTER — Telehealth: Payer: Self-pay | Admitting: Family Medicine

## 2016-05-09 ENCOUNTER — Encounter (HOSPITAL_COMMUNITY): Payer: Self-pay | Admitting: Emergency Medicine

## 2016-05-09 ENCOUNTER — Telehealth: Payer: Self-pay | Admitting: Gastroenterology

## 2016-05-09 ENCOUNTER — Telehealth: Payer: Self-pay

## 2016-05-09 ENCOUNTER — Other Ambulatory Visit: Payer: Self-pay

## 2016-05-09 ENCOUNTER — Emergency Department (HOSPITAL_COMMUNITY)
Admission: EM | Admit: 2016-05-09 | Discharge: 2016-05-09 | Disposition: A | Discharge status: Home / Self Care | Payer: Medicare Other | Attending: Emergency Medicine | Admitting: Emergency Medicine

## 2016-05-09 ENCOUNTER — Ambulatory Visit (HOSPITAL_COMMUNITY)
Admission: RE | Admit: 2016-05-09 | Discharge: 2016-05-09 | Disposition: A | Discharge status: Home / Self Care | Payer: Medicare Other | Source: Ambulatory Visit | Attending: Family Medicine | Admitting: Family Medicine

## 2016-05-09 DIAGNOSIS — I5022 Chronic systolic (congestive) heart failure: Secondary | ICD-10-CM | POA: Insufficient documentation

## 2016-05-09 DIAGNOSIS — I2510 Atherosclerotic heart disease of native coronary artery without angina pectoris: Secondary | ICD-10-CM | POA: Insufficient documentation

## 2016-05-09 DIAGNOSIS — Z95810 Presence of automatic (implantable) cardiac defibrillator: Secondary | ICD-10-CM | POA: Insufficient documentation

## 2016-05-09 DIAGNOSIS — K921 Melena: Secondary | ICD-10-CM

## 2016-05-09 DIAGNOSIS — R55 Syncope and collapse: Secondary | ICD-10-CM

## 2016-05-09 DIAGNOSIS — I252 Old myocardial infarction: Secondary | ICD-10-CM | POA: Insufficient documentation

## 2016-05-09 DIAGNOSIS — D649 Anemia, unspecified: Secondary | ICD-10-CM

## 2016-05-09 DIAGNOSIS — Z87891 Personal history of nicotine dependence: Secondary | ICD-10-CM | POA: Diagnosis not present

## 2016-05-09 DIAGNOSIS — I700 Atherosclerosis of aorta: Secondary | ICD-10-CM

## 2016-05-09 DIAGNOSIS — I130 Hypertensive heart and chronic kidney disease with heart failure and stage 1 through stage 4 chronic kidney disease, or unspecified chronic kidney disease: Secondary | ICD-10-CM | POA: Diagnosis not present

## 2016-05-09 DIAGNOSIS — K6389 Other specified diseases of intestine: Secondary | ICD-10-CM | POA: Diagnosis not present

## 2016-05-09 DIAGNOSIS — K922 Gastrointestinal hemorrhage, unspecified: Secondary | ICD-10-CM | POA: Diagnosis not present

## 2016-05-09 DIAGNOSIS — Z7982 Long term (current) use of aspirin: Secondary | ICD-10-CM | POA: Insufficient documentation

## 2016-05-09 DIAGNOSIS — N189 Chronic kidney disease, unspecified: Secondary | ICD-10-CM | POA: Diagnosis not present

## 2016-05-09 DIAGNOSIS — Z955 Presence of coronary angioplasty implant and graft: Secondary | ICD-10-CM | POA: Diagnosis not present

## 2016-05-09 DIAGNOSIS — J449 Chronic obstructive pulmonary disease, unspecified: Secondary | ICD-10-CM | POA: Diagnosis not present

## 2016-05-09 DIAGNOSIS — K5900 Constipation, unspecified: Secondary | ICD-10-CM

## 2016-05-09 LAB — COMPREHENSIVE METABOLIC PANEL
ALK PHOS: 56 U/L (ref 38–126)
ALT: 12 U/L — AB (ref 17–63)
AST: 21 U/L (ref 15–41)
Albumin: 3.4 g/dL — ABNORMAL LOW (ref 3.5–5.0)
Anion gap: 9 (ref 5–15)
BUN: 11 mg/dL (ref 6–20)
CALCIUM: 8.8 mg/dL — AB (ref 8.9–10.3)
CO2: 23 mmol/L (ref 22–32)
CREATININE: 1.12 mg/dL (ref 0.61–1.24)
Chloride: 105 mmol/L (ref 101–111)
Glucose, Bld: 128 mg/dL — ABNORMAL HIGH (ref 65–99)
Potassium: 4.3 mmol/L (ref 3.5–5.1)
Sodium: 137 mmol/L (ref 135–145)
Total Bilirubin: 0.6 mg/dL (ref 0.3–1.2)
Total Protein: 5.8 g/dL — ABNORMAL LOW (ref 6.5–8.1)

## 2016-05-09 LAB — CBC WITH DIFFERENTIAL/PLATELET
BASOS ABS: 0 10*3/uL (ref 0.0–0.1)
Basophils Relative: 0 %
Eosinophils Absolute: 0.1 10*3/uL (ref 0.0–0.7)
Eosinophils Relative: 1 %
HEMATOCRIT: 35.2 % — AB (ref 39.0–52.0)
HEMOGLOBIN: 11 g/dL — AB (ref 13.0–17.0)
LYMPHS ABS: 1.1 10*3/uL (ref 0.7–4.0)
LYMPHS PCT: 10 %
MCH: 27.2 pg (ref 26.0–34.0)
MCHC: 31.3 g/dL (ref 30.0–36.0)
MCV: 86.9 fL (ref 78.0–100.0)
Monocytes Absolute: 0.6 10*3/uL (ref 0.1–1.0)
Monocytes Relative: 6 %
NEUTROS ABS: 8.8 10*3/uL — AB (ref 1.7–7.7)
NEUTROS PCT: 83 %
Platelets: 166 10*3/uL (ref 150–400)
RBC: 4.05 MIL/uL — AB (ref 4.22–5.81)
RDW: 17.2 % — ABNORMAL HIGH (ref 11.5–15.5)
WBC: 10.6 10*3/uL — AB (ref 4.0–10.5)

## 2016-05-09 LAB — ABO/RH: ABO/RH(D): A POS

## 2016-05-09 LAB — TYPE AND SCREEN
ABO/RH(D): A POS
Antibody Screen: NEGATIVE

## 2016-05-09 MED ORDER — SODIUM CHLORIDE 0.9 % IV BOLUS (SEPSIS)
250.0000 mL | Freq: Once | INTRAVENOUS | Status: AC
Start: 2016-05-09 — End: 2016-05-09
  Administered 2016-05-09: 250 mL via INTRAVENOUS

## 2016-05-09 MED ORDER — IOPAMIDOL (ISOVUE-300) INJECTION 61%
INTRAVENOUS | Status: AC
  Administered 2016-05-09: 100 mL
  Filled 2016-05-09: qty 100

## 2016-05-09 NOTE — ED Notes (Signed)
Pt verbalized understanding discharge instructions and denies any further needs or questions at this time. VS stable, ambulatory and steady gait.   

## 2016-05-09 NOTE — Progress Notes (Signed)
AT 1440 Called to CT emergently for pt feeling weak, diaphoretic and pale immediately after contrast injection.  Upon arrival pt diaphoretic BP 107/83, HR 79 O2 sat in the high 80s on RA.  Ow 3l/Polo placed with raise in sat to 94%.  Radiologist Dr Pascal Lux called to see for possible contrast reaction.  22 gauge iv restarted.  Pt reports feeling very anxious and overall not good.  Reports needing to use bedpan, reports melena stools.  That MD is aware of.  Will bring to radiology nurses station to monitor and wait for disposition.

## 2016-05-09 NOTE — Telephone Encounter (Signed)
I was called by ER MD.  This man had CT today for workup of anemia and abd pain.  Then near syncopal episode for which he was sent to ER from CT scan. They think it was vagal, plan on sending him home.  CT scan shows colon mass and they want to expedite outpatient care.  He needs first available ngi appt (any provider, MD or extender).   thanks

## 2016-05-09 NOTE — ED Triage Notes (Signed)
Per pt.  " I was getting a Ct done and I felt like I was going to pass out/ paralyzed"  Pt reports feeling warm and wanting to go to the bathroom when contrast was being injected.    A&Ox4

## 2016-05-09 NOTE — Telephone Encounter (Signed)
Patient is returning nurse call. Please call patient at [phone]. Pt has questions to ask about procedure he will be getting. Please follow up.  Thank you.

## 2016-05-09 NOTE — ED Provider Notes (Signed)
[facility] DEPT Provider Note   CSN: [id number] Arrival date & time: 05/09/16  1519     History   Chief Complaint Chief Complaint  Patient presents with  . Near Syncope    HPI Christopher [name] is a 56 y.o. male.  HPI 56 year old male who presents with near syncopal episode. He has a history of coronary artery disease with ischemic cardiomyopathy and EF of 10% status post AICD placement. States that he has been having ongoing GI issues since October 2017, with sensation of incomplete voiding after bowel movements, intermittent bloating, GI upset. I was noted by his primary care doctor to have anemia recently, but states that he was fecal occult negative. He has noted occasional bleeding at times, but nothing recently. States that stools appeared dark brown and sometimes, but he has not had black stools. He has not had significant weight loss. Today he underwent CT abdomen and pelvis to workup why he may have occult anemia. States that in the CT he felt like he was going to pass out. States that he felt very warm and almost paralyzed, like he couldn't move. Did not have syncope, but felt like he was about to choke. No chest pain. No recent fevers or chills, vomiting or diarrhea, lower extremity edema, orthopnea or PND, fatigue  Per radiology note: "Called to the scanner to evaluate patient who felt lightheaded after contrast infusion. Patient was normotensive with normal heart rate. He appeared pale. Mentation was normal. No urticaria or difficulty breathing. After few minutes, he felt back to baseline. He describes being weak and faint frequently. He does not feel safe to drive home and would like ER evaluation"  Past Medical History:  Diagnosis Date  . Automatic implantable cardioverter-defibrillator in situ    MDT Aug 2015 Dr. Lovena Le  . CAD (coronary artery disease)    PCI to LAD with DES May 2015  . CAD (coronary artery disease), native coronary artery 07/03/2013   Cath 06/20/13   Normal left main, occluded LAD, occluded RCA, 50% circ EF 15%  3.0 x28 and 3.0 x 8 mm Xience stent Dr. Tamala Julian  To LAD   . CHF (congestive heart failure) ([place]) 05/2013   . Chronic kidney disease   . Chronic systolic CHF (congestive heart failure) (H[facility])    ECHO 08/05/13  EF 30%  Anterior akinesis and inferior hypokinesis   . COPD (chronic obstructive pulmonary disease) ([place])   . Heavy smoker   . Hyperlipidemia   . Myocardial infarction 04/29/2013  . Thrombocytopenia ([place]) 12/27/2014   Chronic      Patient Active Problem List   Diagnosis Date Noted  . Anemia 05/03/2016  . Melena 05/03/2016  . Anxiety state 01/17/2015  . Cardiomyopathy, ischemic   . Thrombocytopenia ([place]) 12/27/2014  . AICD (automatic cardioverter/defibrillator) present   . Financial difficulties 10/01/2013  . CAD (coronary artery disease), native coronary artery 07/03/2013  . Chronic systolic CHF (congestive heart failure) ([place])   . Hyperlipidemia   . Old anterior myocardial infarction 06/20/2013    Past Surgical History:  Procedure Laterality Date  . CARDIAC CATHETERIZATION  05/2013   . CARDIAC CATHETERIZATION N/A 12/28/2014   Procedure: Left Heart Cath and Coronary Angiography;  Surgeon: [name], MD;  Location: [facility];  Service: Cardiovascular;  Laterality: N/A;  . CARDIAC CATHETERIZATION N/A 12/28/2014   Procedure: Coronary Stent Intervention;  Surgeon: [name], MD;  Location: [facility];  Service: Cardiovascular;  Laterality: N/A;  . CORONARY  ANGIOPLASTY  05/2013   . ICD placement  09/07/2013   . IMPLANTABLE CARDIOVERTER DEFIBRILLATOR IMPLANT N/A 09/07/2013   Procedure: IMPLANTABLE CARDIOVERTER DEFIBRILLATOR IMPLANT;  Surgeon: [name], MD;  Location: [facility] CATH LAB;  Service: Cardiovascular;  Laterality: N/A;  . INTRA-AORTIC BALLOON PUMP INSERTION  06/20/2013   Procedure: INTRA-AORTIC BALLOON PUMP INSERTION;  Surgeon: Sinclair Grooms, MD;  Location: [facility] CATH LAB;  Service:  Cardiovascular;;  . LEFT HEART CATHETERIZATION WITH CORONARY ANGIOGRAM N/A 06/20/2013   Procedure: LEFT HEART CATHETERIZATION WITH CORONARY ANGIOGRAM;  Surgeon: Sinclair Grooms, MD;  Location: [facility] CATH LAB;  Service: Cardiovascular;  Laterality: N/A;  . PERCUTANEOUS CORONARY STENT INTERVENTION (PCI-S)  06/20/2013   Procedure: PERCUTANEOUS CORONARY STENT INTERVENTION (PCI-S);  Surgeon: Sinclair Grooms, MD;  Location: [facility] CATH LAB;  Service: Cardiovascular;;       Home Medications    Prior to Admission medications   Medication Sig Start Date End Date Taking? Authorizing Provider  albuterol (PROVENTIL HFA;VENTOLIN HFA) 108 (90 BASE) MCG/ACT inhaler Inhale 2 puffs into the lungs every 6 (six) hours as needed for wheezing or shortness of breath. Patient not taking: Reported on 01/17/2015 12/24/14   Ripudeep Krystal Eaton, MD  aspirin 81 MG chewable tablet Chew 1 tablet (81 mg total) by mouth daily. Patient not taking: Reported on 05/03/2016 09/09/13   Theodis Blaze, MD  atorvastatin (LIPITOR) 80 MG tablet Take 1 tablet (80 mg total) by mouth daily at 6 PM. 09/09/13   Theodis Blaze, MD  carvedilol (COREG) 6.25 MG tablet Take 1 tablet (6.25 mg total) by mouth 2 (two) times daily with a meal. 01/17/15   [name], MD  clopidogrel (PLAVIX) 75 MG tablet Take 75 mg by mouth daily.    Historical Provider, MD  digoxin (LANOXIN) 0.125 MG tablet Take 1 tablet (0.125 mg total) by mouth daily. 09/09/13   Theodis Blaze, MD  ferrous sulfate 325 (65 FE) MG tablet Take 1 tablet (325 mg total) by mouth 2 (two) times daily with a meal. 05/03/16   Josalyn Funches, MD  lisinopril (PRINIVIL,ZESTRIL) 2.5 MG tablet Take 1 tablet (2.5 mg total) by mouth daily. 09/09/13   Theodis Blaze, MD    Family History Family History  Problem Relation Age of Onset  . Emphysema Father   . Heart disease Mother   . Dementia Mother   . Diabetes Mother   . Cancer Neg Hx     Social History Social History  Substance Use Topics  .  Smoking status: Former Smoker    Packs/day: 1.50    Years: 30.00    Types: Cigarettes    Quit date: 12/22/2014  . Smokeless tobacco: Never Used  . Alcohol use No     Comment: quit in 08/2013      Allergies   Celebrex [celecoxib]   Review of Systems Review of Systems  Constitutional: Negative for fever.  Respiratory: Positive for shortness of breath.   Cardiovascular: Negative for chest pain.  Gastrointestinal: Positive for abdominal distention. Negative for abdominal pain.  Genitourinary: Negative for difficulty urinating.  Allergic/Immunologic: Negative for immunocompromised state.  Neurological: Positive for light-headedness. Negative for syncope.  Hematological: Does not bruise/bleed easily.  Psychiatric/Behavioral: Negative for confusion.  All other systems reviewed and are negative.    Physical Exam Updated Vital Signs BP 112/79   Pulse 77   Temp 98.1 F (36.7 C) (Oral)   Resp 19   Ht 5\' 7"  (1.702 m)  Wt 157 lb (71.2 kg)   SpO2 100%   BMI 24.59 kg/m   Physical Exam Physical Exam  Nursing note and vitals reviewed. Constitutional: non-toxic, and in no acute distress Head: Normocephalic and atraumatic.  Mouth/Throat: Oropharynx is clear and moist.  Neck: Normal range of motion. Neck supple.  Cardiovascular: Normal rate and regular rhythm.   Pulmonary/Chest: Effort normal and breath sounds normal.  Abdominal: Soft. Mild distension.  There is no tenderness. There is no rebound and no guarding.  Musculoskeletal: Normal range of motion.  Neurological: Alert, no facial droop, fluent speech Skin: Skin is warm and dry.  Psychiatric: Cooperative   ED Treatments / Results  Labs (all labs ordered are listed, but only abnormal results are displayed) Labs Reviewed  CBC WITH DIFFERENTIAL/PLATELET - Abnormal; Notable for the following:       Result Value   WBC 10.6 (*)    RBC 4.05 (*)    Hemoglobin 11.0 (*)    HCT 35.2 (*)    RDW 17.2 (*)    Neutro Abs 8.8  (*)    All other components within normal limits  COMPREHENSIVE METABOLIC PANEL - Abnormal; Notable for the following:    Glucose, Bld 128 (*)    Calcium 8.8 (*)    Total Protein 5.8 (*)    Albumin 3.4 (*)    ALT 12 (*)    All other components within normal limits  TYPE AND SCREEN  ABO/RH    EKG  EKG Interpretation  Date/Time:  Wednesday May 09 2016 17:10:15 EDT Ventricular Rate:  72 PR Interval:    QRS Duration: 108 QT Interval:  364 QTC Calculation: 399 R Axis:   84 Text Interpretation:  Sinus rhythm Probable left atrial enlargement LVH with secondary repolarization abnormality Anterolateral infarct, age indeterminate Similar to prior EKG  Confirmed by LIU MD, DANA [phone]) on 05/09/2016 6:21:45 PM       Radiology Ct Abdomen Pelvis W Contrast  Result Date: 05/09/2016 CLINICAL DATA:  Abdominal pain.  Chronic lower GI bleeding. EXAM: CT ABDOMEN AND PELVIS WITH CONTRAST TECHNIQUE: Multidetector CT imaging of the abdomen and pelvis was performed using the standard protocol following bolus administration of intravenous contrast. CONTRAST:  16mL ISOVUE-300 IOPAMIDOL (ISOVUE-300) INJECTION 61% COMPARISON:  None. FINDINGS: Lower chest: Dilated left ventricle with fibrofatty scarring and thinning along the lower septum and apex. Right ventricular pacer lead. Hepatobiliary: No evidence of metastatic diseaseNo evidence of biliary obstruction or stone. Pancreas: Unremarkable. Spleen: Unremarkable. Adrenals/Urinary Tract: Negative adrenals. No hydronephrosis or stone. Bilateral simple appearing renal cysts. Renal cortical scarring affecting the upper and lower pole left kidney. Unremarkable bladder. Stomach/Bowel: Concerning thickening along the distal descending colon, with stool retention proximal to this segment and decompressed bowel distally. There is a enlarged mesocolic lymph node at this level measuring 10 mm. No superimposed inflammatory changes. Mild colonic diverticulosis.  Vascular/Lymphatic: Aortic atherosclerosis, extensive for age. No acute vascular finding. Pericolic adenopathy as described. No retroperitoneal adenopathy seen. Reproductive:No pathologic findings. Other: No ascites or pneumoperitoneum. Musculoskeletal: No acute abnormalities. Called to the scanner to evaluate patient who felt lightheaded after contrast infusion. Patient was normotensive with normal heart rate. He appeared pale. Mentation was normal. No urticaria or difficulty breathing. After few minutes, he felt back to baseline. He describes being weak and faint frequently. He does not feel safe to drive home and would like ER evaluation. Findings and event swere called by telephone at the time of interpretation on 05/09/2016 at 3:03 pm to Dr Herbert Spires,  covering for Dr. [name]. ER is aware of transfer. IMPRESSION: 1. 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 There is masslike thickening in this region and extensive proximal stool retention. Mesocolic adenopathy, presumed metastatic node. 2. Patient experienced mild contrast reaction as described above. He return to baseline without treatment. Patient is being transferred to ER for evaluation of faintness that was present prior to contrast. 3.  Aortic Atherosclerosis (ICD10-170.0), age advanced 83. Remote left ventricular infarct with dilatation. 5. Subcentimeter hypervascular focus in the upper liver, not typical for metastatic disease. Attention on follow-up Electronically Signed   By: Monte Fantasia M.D.   On: 05/09/2016 15:07    Procedures Procedures (including critical care time)  Medications Ordered in ED Medications  sodium chloride 0.9 % bolus 250 mL (250 mLs Intravenous New Bag/Given 05/09/16 1931)     Initial Impression / Assessment and Plan / ED Course  I have reviewed the triage vital signs and the nursing notes.  Pertinent labs & imaging results that were available during my care of the patient were reviewed by me and considered in my medical  decision making (see chart for details).      I reviewed the available records in [facility] and also Care Everywhere. Outpatient CT results are visualized, and shows evidence of a descending colonic mass that is concerning for malignancy. I discussed this with the patient as well as low powered gastroenterology. They feel that patient can be worked up as outpatient, and will call patient tomorrow morning for close appointment for workup.  Pacemaker/AICD is interrogated, and does not show any arrhythmia that his near syncopal episode today. Sounds like this may have been reaction to his contrast by radiology note. He does have slight anemia of 11 from a baseline of around 15. No current active bleeding that he reports. Not requiring any transfusion. Does have multiple episodes of diarrhea here since receiving oral contrast and briefly did drop BP to 90s SBP. Suspect this is diarrhea related, and given small bolus of fluids. He mentates well and asymptomatic during this. Normalizes BP after small fluids. Feels back at baseline and stable for discharge.   Strict return and follow-up instructions reviewed. He expressed understanding of all discharge instructions and felt comfortable with the plan of care.    Final Clinical Impressions(s) / ED Diagnoses   Final diagnoses:  Near syncope  Colonic mass    New Prescriptions New Prescriptions   No medications on file     Forde Dandy, MD 05/09/16 2121

## 2016-05-09 NOTE — Telephone Encounter (Signed)
Pt was called and a VM was left informing pt to return phone call for lab results. 

## 2016-05-09 NOTE — Discharge Instructions (Signed)
Your outpatient CT scan shows that you have a potential mass in your colon. I spoke with gastroenterology, who will call you tomorrow morning for follow-up appointment. If you do not hear from them please call them.  Return for worsening symptoms, including severe rectal bleeding, passing out, or any other symptoms concerning to you.

## 2016-05-10 ENCOUNTER — Encounter: Payer: Self-pay | Admitting: Family Medicine

## 2016-05-10 NOTE — Telephone Encounter (Signed)
Pt was called and there was no answer.

## 2016-05-10 NOTE — Telephone Encounter (Signed)
Pt has an appt with Nevin Bloodgood on 05/11/16

## 2016-05-10 NOTE — Telephone Encounter (Signed)
Patient called verified name and DOB  He was informed of lab results Anemia, severe iron deficiency Oral iron ordered. He has been taking it.  He is also already aware of and informed of CT abdomen results.   He is upset about his experience following his CT scan with uncontrolled bowel movement, feeling paralzyed after scan. It was felt to have contrast reaction. He was sent over to the ED to be stabilized.   He is anxious about probable cancer diagnosis, low income, lack of social support, he prefers to avoid surgery.   IMPRESSION: 1. 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 There is masslike thickening in this region and extensive proximal stool retention. Mesocolic adenopathy, presumed metastatic node. 2. Patient experienced mild contrast reaction as described above. He return to baseline without treatment. Patient is being transferred to ER for evaluation of faintness that was present prior to contrast. 3.  Aortic Atherosclerosis (ICD10-170.0), age advanced 68. Remote left ventricular infarct with dilatation. 5. Subcentimeter hypervascular focus in the upper liver, not typical for metastatic disease. Attention on follow-up  He is aware of GI appointment tomorrow on 05/11/2016 at 8:30 AM.   He is advised to call if desires medication for anxiety, he declines at the moment stating he has no money until 05/23/2016.

## 2016-05-11 ENCOUNTER — Telehealth: Payer: Self-pay

## 2016-05-11 ENCOUNTER — Ambulatory Visit (INDEPENDENT_AMBULATORY_CARE_PROVIDER_SITE_OTHER): Payer: Medicare Other | Admitting: Gastroenterology

## 2016-05-11 ENCOUNTER — Encounter: Payer: Self-pay | Admitting: Gastroenterology

## 2016-05-11 VITALS — BP 100/60 | HR 76 | Ht 68.0 in | Wt 158.0 lb

## 2016-05-11 DIAGNOSIS — K639 Disease of intestine, unspecified: Secondary | ICD-10-CM | POA: Diagnosis not present

## 2016-05-11 DIAGNOSIS — R933 Abnormal findings on diagnostic imaging of other parts of digestive tract: Secondary | ICD-10-CM

## 2016-05-11 DIAGNOSIS — I255 Ischemic cardiomyopathy: Secondary | ICD-10-CM | POA: Diagnosis not present

## 2016-05-11 DIAGNOSIS — Z01810 Encounter for preprocedural cardiovascular examination: Secondary | ICD-10-CM | POA: Insufficient documentation

## 2016-05-11 DIAGNOSIS — Z7902 Long term (current) use of antithrombotics/antiplatelets: Secondary | ICD-10-CM

## 2016-05-11 DIAGNOSIS — K6389 Other specified diseases of intestine: Secondary | ICD-10-CM

## 2016-05-11 MED ORDER — NA SULFATE-K SULFATE-MG SULF 17.5-3.13-1.6 GM/177ML PO SOLN: ORAL | 0 refills | Status: DC

## 2016-05-11 NOTE — Patient Instructions (Addendum)
You have been scheduled for a colonoscopy. Please follow written instructions given to you at your visit today.  Please pick up your prep supplies at the pharmacy within the next 1-3 days. If you use inhalers (even only as needed), please bring them with you on the day of your procedure.  You will be contacted by our office prior to your procedure for directions on holding your Plavix.  If you do not hear from our office 1 week prior to your scheduled procedure, please call [phone] to discuss.   Please take Miralax 17 grams (1 capful) dissolved in at least 8 ounces of water twice daily starting today until your colonoscopy. This can be purchased over the counter. Sample of Suprep and Miralax given to patient.  Normal BMI (Body Mass Index- based on height and weight) is between 19 and 25. Your BMI today is Body mass index is 24.02 kg/m. Marland Kitchen Please consider follow up  regarding your BMI with your Primary Care Provider.  Thank you

## 2016-05-11 NOTE — Telephone Encounter (Signed)
Verbal from Dr Wynonia Lawman office patient can stop plavix 5 days prior to colonoscopy.  Patient informed while in office.

## 2016-05-11 NOTE — Progress Notes (Signed)
05/11/2016 Christopher [name] [id number] 10/02/1960   HISTORY OF PRESENT ILLNESS:  This is a 56 year old male who is new to our practice.  He was referred here by Dr. [name], for evaluation regarding an abnormal CT scan.  Patient tells me that he has been experiencing abdominal discomfort, which he describes as a lot of gas, some rectal bleeding, and a change in bowel habits for some time now. He has had a lot of cardiac issues over the last 1.5-2 years as well. He has an ejection fraction of 10% due to ischemic cardiomyopathy. Has an ICD in place. Is on Plavix for his coronary artery disease. He had a CT scan performed 2 days ago, which showed a probable descending colon carcinoma with masslike thickening in that region and extensive proximal stool retention. There is mesocolic adenopathy as well.  He tells me that he is in fact moving his bowels. He sometimes skips a day or so, however. After having the CT scan performed he had a lot of diarrhea from the oral contrast.    Past Medical History:  Diagnosis Date  . Automatic implantable cardioverter-defibrillator in situ    MDT Aug 2015 Dr. Lovena Le  . CAD (coronary artery disease)    PCI to LAD with DES May 2015  . CAD (coronary artery disease), native coronary artery 07/03/2013   Cath 06/20/13  Normal left main, occluded LAD, occluded RCA, 50% circ EF 15%  3.0 x28 and 3.0 x 8 mm Xience stent Dr. Tamala Julian  To LAD   . CHF (congestive heart failure) ([place]) 05/2013   . Chronic kidney disease   . Chronic systolic CHF (congestive heart failure) (H[facility])    ECHO 08/05/13  EF 30%  Anterior akinesis and inferior hypokinesis   . COPD (chronic obstructive pulmonary disease) ([place])   . Heavy smoker   . Hyperlipidemia   . Myocardial infarction 04/29/2013  . Thrombocytopenia ([place]) 12/27/2014   Chronic     Past Surgical History:  Procedure Laterality Date  . CARDIAC CATHETERIZATION  05/2013   . CARDIAC CATHETERIZATION N/A 12/28/2014   Procedure: Left Heart  Cath and Coronary Angiography;  Surgeon: [name], MD;  Location: [facility] LAB;  Service: Cardiovascular;  Laterality: N/A;  . CARDIAC CATHETERIZATION N/A 12/28/2014   Procedure: Coronary Stent Intervention;  Surgeon: [name], MD;  Location: [facility];  Service: Cardiovascular;  Laterality: N/A;  . CORONARY ANGIOPLASTY  05/2013   . ICD placement  09/07/2013   . IMPLANTABLE CARDIOVERTER DEFIBRILLATOR IMPLANT N/A 09/07/2013   Procedure: IMPLANTABLE CARDIOVERTER DEFIBRILLATOR IMPLANT;  Surgeon: [name], MD;  Location[facility] CATH LAB;  Service: Cardiovascular;  Laterality: N/A;  . INTRA-AORTIC BALLOON PUMP INSERTION  06/20/2013   Procedure: INTRA-AORTIC BALLOON PUMP INSERTION;  Surgeon: Sinclair Grooms, MD;  Location: [facility] CATH LAB;  Service: Cardiovascular;;  . LEFT HEART CATHETERIZATION WITH CORONARY ANGIOGRAM N/A 06/20/2013   Procedure: LEFT HEART CATHETERIZATION WITH CORONARY ANGIOGRAM;  Surgeon: Sinclair Grooms, MD;  Location: [facility] CATH LAB;  Service: Cardiovascular;  Laterality: N/A;  . PERCUTANEOUS CORONARY STENT INTERVENTION (PCI-S)  06/20/2013   Procedure: PERCUTANEOUS CORONARY STENT INTERVENTION (PCI-S);  Surgeon: Sinclair Grooms, MD;  Location: [facility] CATH LAB;  Service: Cardiovascular;;    reports that he quit smoking about 16 months ago. His smoking use included Cigarettes. He has a 45.00 pack-year smoking history. He has never used smokeless tobacco. He reports that he does not drink  alcohol or use drugs. family history includes Dementia in his mother; Diabetes in his mother; Emphysema in his father; Heart disease in his mother; Lung cancer in his maternal grandmother; Pancreatic cancer in his maternal grandmother. Allergies  Allergen Reactions  . Celebrex [Celecoxib] Other (See Comments)    Caused his back to burn      Outpatient Encounter Prescriptions as of 05/11/2016  Medication Sig  . aspirin 81 MG chewable tablet Chew 1 tablet (81 mg total) by mouth  daily.  Marland Kitchen atorvastatin (LIPITOR) 80 MG tablet Take 1 tablet (80 mg total) by mouth daily at 6 PM.  . carvedilol (COREG) 6.25 MG tablet Take 1 tablet (6.25 mg total) by mouth 2 (two) times daily with a meal.  . clopidogrel (PLAVIX) 75 MG tablet Take 75 mg by mouth daily.  . digoxin (LANOXIN) 0.125 MG tablet Take 1 tablet (0.125 mg total) by mouth daily.  . ferrous sulfate 325 (65 FE) MG tablet Take 1 tablet (325 mg total) by mouth 2 (two) times daily with a meal.  . lisinopril (PRINIVIL,ZESTRIL) 2.5 MG tablet Take 1 tablet (2.5 mg total) by mouth daily.  . [DISCONTINUED] albuterol (PROVENTIL HFA;VENTOLIN HFA) 108 (90 BASE) MCG/ACT inhaler Inhale 2 puffs into the lungs every 6 (six) hours as needed for wheezing or shortness of breath. (Patient not taking: Reported on 01/17/2015)   No facility-administered encounter medications on file as of 05/11/2016.      REVIEW OF SYSTEMS  : All other systems reviewed and negative except where noted in the History of Present Illness.   PHYSICAL EXAM: BP 100/60   Pulse 76   Ht 5\' 8"  (1.727 m)   Wt 158 lb (71.7 kg)   BMI 24.02 kg/m  General: Well developed white male in no acute distress Head: Normocephalic and atraumatic Eyes:  Sclerae anicteric, conjunctiva pink. Ears: Normal auditory acuity Lungs: Clear throughout to auscultation Heart: Regular rate and rhythm Abdomen: Soft, non-distended. Normal bowel sounds.  Mild left sided TTP.  Actually exam is quite benign. Rectal:  Will be done at the time of colonoscopy. Musculoskeletal: Symmetrical with no gross deformities  Skin: No lesions on visible extremities Extremities: No edema  Neurological: Alert oriented x 4, grossly non-focal Psychological:  Alert and cooperative. Normal mood and affect  ASSESSMENT AND PLAN: -56 year old male with abnormal CT scan that is extremely concerning for left colon cancer.  Has been experiencing some abdominal pain and rectal bleeding, but mostly complaints of a  lot of gas.  Is moving his bowels, but has noted a change in them. -CAD/ICM with EF of approximately 10%:  Had ICD.  Is on Plavix.  *This is an extremely complicated situation. The patient needs some type of evaluation/intervention regarding this tumor. He has extremely poor cardiac status, which makes him extremely high risk for any type of procedure/sedation. We are going to schedule him for colonoscopy at [facility] next week with Dr. Loletha Carrow.  He needs clearance from cardiology to hold his Plavix.  We are in the process of contacting Dr. Wynonia Lawman regarding this issue.   CC:  [name], MD

## 2016-05-11 NOTE — Telephone Encounter (Signed)
Left message for patient to use 2 fleet enemas the morning of his procedure and should space them about 30 minutes apart.

## 2016-05-13 NOTE — Progress Notes (Signed)
Thank you for sending this case to me and discussing it with me at length when the patient was in clinic. I have reviewed the entire note, and the outlined plan is what we discussed.  I would prefer that he be off plavix 5 sayd prior to procedure (last DES few yrs back).  However, if cardiology feels it is too risky to do so,  that will be OK.  Wilfrid Lund, MD

## 2016-05-14 ENCOUNTER — Telehealth: Payer: Self-pay | Admitting: Family Medicine

## 2016-05-14 ENCOUNTER — Telehealth: Payer: Self-pay | Admitting: Gastroenterology

## 2016-05-14 NOTE — Telephone Encounter (Signed)
Patient was concerned about taking his iron tablet, told him to just hold it the day of the procedure. Patient verbalizes his understanding of other medications. Patient reassured as he is very anxious about the procedure and prognosis.

## 2016-05-14 NOTE — Telephone Encounter (Signed)
Pt said he is returning your call 

## 2016-05-14 NOTE — Telephone Encounter (Signed)
Patient called stating she has an appointment to have a colonoscopy done on Wednesday and she is wondering if she can take ferrous sulfate 325 (65 FE) MG tablet  Because her stool is green and she want to make sure is ok to take it and won't affect the result? Please follow up

## 2016-05-15 ENCOUNTER — Telehealth: Payer: Self-pay

## 2016-05-15 ENCOUNTER — Ambulatory Visit: Payer: Medicare Other | Admitting: Nurse Practitioner

## 2016-05-15 NOTE — Progress Notes (Signed)
Anesthesia Chart Review:  Pt is a same day work up.   Pt is a 56 year old male scheduled for colonoscopy with propofol for colon mass on 05/16/2016 with Wray Kearns, MD  - PCP is [name], MD - Cardiologist is Tollie Eth, MD (office visit note 04/26/16 in media tab) - EP cardiologist is [name], MD, last office visit 09/29/15  PMH includes:  CAD (DES to OM 2016; DES to LAD 2015), chronic CHF (EF 10%), AICD ([employer], implanted 09/07/13), hyperlipidemia, COPD, chronic thrombocytopenia. Former heavy smoker (quit 11/2014). BMI 24.   Medications include: ASA 81mg , lipitor, carvedilol, plavix, digoxin, iron, lisinopril. Pt was to have stopped plavix 5 days before procedure per  Dr. Wynonia Lawman (see telephone encounter 05/11/16 by Elizabeth Palau)  Labs from ED 05/09/16 reviewed. CBC and CMET are acceptable for surgery.    EKG 05/09/16: Sinus rhythm. Probable left atrial enlargement. LVH with secondary repolarization abnormality. Anterolateral infarct, age indeterminate.  Cardiac cath 12/28/14:   Mid RCA lesion, 100% stenosed.  2nd Mrg lesion, 95% stenosed. Post intervention, there is a 0% residual stenosis.  There is severe left ventricular systolic dysfunction.   Severe global LV dysfunction with a dilated left ventricle and ejection fraction of approximately 10%.  Widely patent stent in the proximal LAD and a otherwise normal LAD with distal septal collaterals supplying the distal RCA PDA system; large left circumflex coronary artery with a very large OM 2 branch that had 95% eccentric ulcerated plaque, proximal 99% RCA stenosis followed by a focal aneurysm prior to the RV marginal branch takeoff with total occlusion of the RCA after the marginal branch.  Successful PCI to the large OM 2 vessel with PTCA/DES stenting with the 95% stenosis being reduced to 0% and with brisk TIMI-3 flow without evidence for dissection.  RECOMMENDATION: Since the patient developed an ulcerated  plaque and ruled in for non-ST segment MI while taking Plavix, Brilinta was used for antiplatelet therapy.  The patient also has a history of thrombocytopenia.  The patient will follow-up with Dr. Wynonia Lawman, who is his primary cardiologist, who will ultimately decide long-term antiplatelet therapy which should be carried on indefinitely.  If Plavix is to be reinstituted, P2 Y12 testing or genetic assessment should be undertaken to assess for platelet responsiveness.   Perioperative prescription for ICD form pending.   Pt will need further evaluation by assigned anesthesiologist DOS.   Willeen Cass, FNP-BC [facility]/Anesthesiology Phone: [phone] 05/15/2016 4:49 PM

## 2016-05-15 NOTE — Telephone Encounter (Signed)
Called patient's mobile number, left vm that he needs to take 2 fleets enemas at 7:00 am tomorrow, will need to space them out 30 minutes apart. Asked patient to call back to office to confirm he did get this message.

## 2016-05-15 NOTE — Progress Notes (Signed)
I was unable to reach patient by phone.  I left  A message on voice mail.  I instructed the patient to arrive at [place] entrance at 9:30 AM  , nothing to eat or drink after midnight.   I instructed the patient to take the following medications in the am with just enough water to get them down: Carvedilol, Digoxin/  I asked patient to not wear any lotions, powders, cologne, jewelry, piercing, make-up or nail polish.  I asked the patient to call [phone], in the am if there were any questions or problems.

## 2016-05-16 ENCOUNTER — Encounter (HOSPITAL_COMMUNITY): Payer: Self-pay | Admitting: Emergency Medicine

## 2016-05-16 ENCOUNTER — Ambulatory Visit (HOSPITAL_COMMUNITY): Payer: Medicare Other | Admitting: Emergency Medicine

## 2016-05-16 ENCOUNTER — Encounter (HOSPITAL_COMMUNITY): Payer: Self-pay

## 2016-05-16 ENCOUNTER — Encounter (HOSPITAL_COMMUNITY)
Admission: RE | Disposition: A | Discharge status: Home / Self Care | Payer: Self-pay | Source: Ambulatory Visit | Attending: Family Medicine

## 2016-05-16 ENCOUNTER — Inpatient Hospital Stay (HOSPITAL_COMMUNITY)
Admission: RE | Admit: 2016-05-16 | Discharge: 2016-05-29 | DRG: 330 | Disposition: A | Discharge status: Home / Self Care | Payer: Medicare Other | Source: Ambulatory Visit | Attending: Family Medicine | Admitting: Family Medicine

## 2016-05-16 ENCOUNTER — Emergency Department (HOSPITAL_COMMUNITY)
Admission: EM | Admit: 2016-05-16 | Discharge: 2016-05-16 | Disposition: A | Discharge status: Home / Self Care | Payer: Medicare Other | Source: Home / Self Care | Attending: Emergency Medicine | Admitting: Emergency Medicine

## 2016-05-16 DIAGNOSIS — I252 Old myocardial infarction: Secondary | ICD-10-CM

## 2016-05-16 DIAGNOSIS — Z7982 Long term (current) use of aspirin: Secondary | ICD-10-CM | POA: Insufficient documentation

## 2016-05-16 DIAGNOSIS — Y848 Other medical procedures as the cause of abnormal reaction of the patient, or of later complication, without mention of misadventure at the time of the procedure: Secondary | ICD-10-CM | POA: Diagnosis present

## 2016-05-16 DIAGNOSIS — Z79899 Other long term (current) drug therapy: Secondary | ICD-10-CM

## 2016-05-16 DIAGNOSIS — Z95810 Presence of automatic (implantable) cardiac defibrillator: Secondary | ICD-10-CM | POA: Diagnosis not present

## 2016-05-16 DIAGNOSIS — M549 Dorsalgia, unspecified: Secondary | ICD-10-CM | POA: Diagnosis present

## 2016-05-16 DIAGNOSIS — I110 Hypertensive heart disease with heart failure: Secondary | ICD-10-CM

## 2016-05-16 DIAGNOSIS — D500 Iron deficiency anemia secondary to blood loss (chronic): Secondary | ICD-10-CM | POA: Diagnosis not present

## 2016-05-16 DIAGNOSIS — D490 Neoplasm of unspecified behavior of digestive system: Secondary | ICD-10-CM

## 2016-05-16 DIAGNOSIS — R918 Other nonspecific abnormal finding of lung field: Secondary | ICD-10-CM | POA: Diagnosis not present

## 2016-05-16 DIAGNOSIS — I25119 Atherosclerotic heart disease of native coronary artery with unspecified angina pectoris: Secondary | ICD-10-CM | POA: Diagnosis present

## 2016-05-16 DIAGNOSIS — I10 Essential (primary) hypertension: Secondary | ICD-10-CM | POA: Diagnosis not present

## 2016-05-16 DIAGNOSIS — Y92239 Unspecified place in hospital as the place of occurrence of the external cause: Secondary | ICD-10-CM | POA: Diagnosis present

## 2016-05-16 DIAGNOSIS — C186 Malignant neoplasm of descending colon: Principal | ICD-10-CM | POA: Diagnosis present

## 2016-05-16 DIAGNOSIS — N189 Chronic kidney disease, unspecified: Secondary | ICD-10-CM

## 2016-05-16 DIAGNOSIS — K639 Disease of intestine, unspecified: Secondary | ICD-10-CM | POA: Diagnosis not present

## 2016-05-16 DIAGNOSIS — Z800 Family history of malignant neoplasm of digestive organs: Secondary | ICD-10-CM

## 2016-05-16 DIAGNOSIS — R109 Unspecified abdominal pain: Secondary | ICD-10-CM | POA: Diagnosis not present

## 2016-05-16 DIAGNOSIS — N179 Acute kidney failure, unspecified: Secondary | ICD-10-CM | POA: Diagnosis present

## 2016-05-16 DIAGNOSIS — C772 Secondary and unspecified malignant neoplasm of intra-abdominal lymph nodes: Secondary | ICD-10-CM | POA: Diagnosis present

## 2016-05-16 DIAGNOSIS — Z87891 Personal history of nicotine dependence: Secondary | ICD-10-CM

## 2016-05-16 DIAGNOSIS — I255 Ischemic cardiomyopathy: Secondary | ICD-10-CM | POA: Diagnosis not present

## 2016-05-16 DIAGNOSIS — I130 Hypertensive heart and chronic kidney disease with heart failure and stage 1 through stage 4 chronic kidney disease, or unspecified chronic kidney disease: Secondary | ICD-10-CM | POA: Diagnosis not present

## 2016-05-16 DIAGNOSIS — R188 Other ascites: Secondary | ICD-10-CM | POA: Diagnosis not present

## 2016-05-16 DIAGNOSIS — I2510 Atherosclerotic heart disease of native coronary artery without angina pectoris: Secondary | ICD-10-CM | POA: Insufficient documentation

## 2016-05-16 DIAGNOSIS — K56600 Partial intestinal obstruction, unspecified as to cause: Secondary | ICD-10-CM | POA: Diagnosis present

## 2016-05-16 DIAGNOSIS — C189 Malignant neoplasm of colon, unspecified: Secondary | ICD-10-CM | POA: Diagnosis not present

## 2016-05-16 DIAGNOSIS — F068 Other specified mental disorders due to known physiological condition: Secondary | ICD-10-CM | POA: Diagnosis present

## 2016-05-16 DIAGNOSIS — K6389 Other specified diseases of intestine: Secondary | ICD-10-CM

## 2016-05-16 DIAGNOSIS — Z833 Family history of diabetes mellitus: Secondary | ICD-10-CM

## 2016-05-16 DIAGNOSIS — D72829 Elevated white blood cell count, unspecified: Secondary | ICD-10-CM | POA: Diagnosis not present

## 2016-05-16 DIAGNOSIS — K567 Ileus, unspecified: Secondary | ICD-10-CM | POA: Diagnosis not present

## 2016-05-16 DIAGNOSIS — I5032 Chronic diastolic (congestive) heart failure: Secondary | ICD-10-CM

## 2016-05-16 DIAGNOSIS — K921 Melena: Secondary | ICD-10-CM | POA: Diagnosis not present

## 2016-05-16 DIAGNOSIS — Z01810 Encounter for preprocedural cardiovascular examination: Secondary | ICD-10-CM | POA: Diagnosis not present

## 2016-05-16 DIAGNOSIS — D62 Acute posthemorrhagic anemia: Secondary | ICD-10-CM | POA: Diagnosis present

## 2016-05-16 DIAGNOSIS — R1032 Left lower quadrant pain: Secondary | ICD-10-CM

## 2016-05-16 DIAGNOSIS — T50905A Adverse effect of unspecified drugs, medicaments and biological substances, initial encounter: Secondary | ICD-10-CM | POA: Diagnosis present

## 2016-05-16 DIAGNOSIS — R0602 Shortness of breath: Secondary | ICD-10-CM | POA: Diagnosis not present

## 2016-05-16 DIAGNOSIS — R140 Abdominal distension (gaseous): Secondary | ICD-10-CM

## 2016-05-16 DIAGNOSIS — K56609 Unspecified intestinal obstruction, unspecified as to partial versus complete obstruction: Secondary | ICD-10-CM | POA: Diagnosis not present

## 2016-05-16 DIAGNOSIS — M1990 Unspecified osteoarthritis, unspecified site: Secondary | ICD-10-CM | POA: Diagnosis not present

## 2016-05-16 DIAGNOSIS — J449 Chronic obstructive pulmonary disease, unspecified: Secondary | ICD-10-CM | POA: Diagnosis present

## 2016-05-16 DIAGNOSIS — K5669 Other partial intestinal obstruction: Secondary | ICD-10-CM | POA: Diagnosis not present

## 2016-05-16 DIAGNOSIS — G4700 Insomnia, unspecified: Secondary | ICD-10-CM | POA: Diagnosis present

## 2016-05-16 DIAGNOSIS — F329 Major depressive disorder, single episode, unspecified: Secondary | ICD-10-CM | POA: Diagnosis not present

## 2016-05-16 DIAGNOSIS — T814XXA Infection following a procedure, initial encounter: Secondary | ICD-10-CM | POA: Diagnosis not present

## 2016-05-16 DIAGNOSIS — E46 Unspecified protein-calorie malnutrition: Secondary | ICD-10-CM | POA: Diagnosis present

## 2016-05-16 DIAGNOSIS — R079 Chest pain, unspecified: Secondary | ICD-10-CM | POA: Diagnosis not present

## 2016-05-16 DIAGNOSIS — C801 Malignant (primary) neoplasm, unspecified: Secondary | ICD-10-CM

## 2016-05-16 DIAGNOSIS — I509 Heart failure, unspecified: Secondary | ICD-10-CM | POA: Diagnosis not present

## 2016-05-16 DIAGNOSIS — Z7902 Long term (current) use of antithrombotics/antiplatelets: Secondary | ICD-10-CM | POA: Diagnosis not present

## 2016-05-16 DIAGNOSIS — K5730 Diverticulosis of large intestine without perforation or abscess without bleeding: Secondary | ICD-10-CM | POA: Diagnosis present

## 2016-05-16 DIAGNOSIS — Z825 Family history of asthma and other chronic lower respiratory diseases: Secondary | ICD-10-CM

## 2016-05-16 DIAGNOSIS — R441 Visual hallucinations: Secondary | ICD-10-CM | POA: Diagnosis not present

## 2016-05-16 DIAGNOSIS — I5022 Chronic systolic (congestive) heart failure: Secondary | ICD-10-CM | POA: Diagnosis not present

## 2016-05-16 DIAGNOSIS — F419 Anxiety disorder, unspecified: Secondary | ICD-10-CM | POA: Diagnosis present

## 2016-05-16 DIAGNOSIS — Z91041 Radiographic dye allergy status: Secondary | ICD-10-CM

## 2016-05-16 DIAGNOSIS — C19 Malignant neoplasm of rectosigmoid junction: Secondary | ICD-10-CM | POA: Diagnosis not present

## 2016-05-16 DIAGNOSIS — G8929 Other chronic pain: Secondary | ICD-10-CM | POA: Diagnosis present

## 2016-05-16 DIAGNOSIS — Z8249 Family history of ischemic heart disease and other diseases of the circulatory system: Secondary | ICD-10-CM

## 2016-05-16 DIAGNOSIS — E785 Hyperlipidemia, unspecified: Secondary | ICD-10-CM | POA: Diagnosis present

## 2016-05-16 DIAGNOSIS — R1084 Generalized abdominal pain: Secondary | ICD-10-CM

## 2016-05-16 DIAGNOSIS — R112 Nausea with vomiting, unspecified: Secondary | ICD-10-CM | POA: Diagnosis not present

## 2016-05-16 DIAGNOSIS — Z886 Allergy status to analgesic agent status: Secondary | ICD-10-CM

## 2016-05-16 DIAGNOSIS — Z955 Presence of coronary angioplasty implant and graft: Secondary | ICD-10-CM

## 2016-05-16 DIAGNOSIS — D696 Thrombocytopenia, unspecified: Secondary | ICD-10-CM | POA: Diagnosis present

## 2016-05-16 DIAGNOSIS — D649 Anemia, unspecified: Secondary | ICD-10-CM | POA: Diagnosis not present

## 2016-05-16 DIAGNOSIS — K2970 Gastritis, unspecified, without bleeding: Secondary | ICD-10-CM | POA: Diagnosis not present

## 2016-05-16 DIAGNOSIS — R1111 Vomiting without nausea: Secondary | ICD-10-CM | POA: Diagnosis not present

## 2016-05-16 DIAGNOSIS — Z801 Family history of malignant neoplasm of trachea, bronchus and lung: Secondary | ICD-10-CM

## 2016-05-16 HISTORY — DX: Other specified diseases of intestine: K63.89

## 2016-05-16 HISTORY — PX: FLEXIBLE SIGMOIDOSCOPY: SHX5431

## 2016-05-16 HISTORY — DX: Partial intestinal obstruction, unspecified as to cause: K56.600

## 2016-05-16 HISTORY — DX: Essential (primary) hypertension: I10

## 2016-05-16 LAB — POCT I-STAT, CHEM 8
BUN: 18 mg/dL (ref 6–20)
CALCIUM ION: 1.12 mmol/L — AB (ref 1.15–1.40)
CHLORIDE: 101 mmol/L (ref 101–111)
CREATININE: 1.3 mg/dL — AB (ref 0.61–1.24)
Glucose, Bld: 88 mg/dL (ref 65–99)
HEMATOCRIT: 37 % — AB (ref 39.0–52.0)
Hemoglobin: 12.6 g/dL — ABNORMAL LOW (ref 13.0–17.0)
Potassium: 3.7 mmol/L (ref 3.5–5.1)
Sodium: 140 mmol/L (ref 135–145)
TCO2: 26 mmol/L (ref 0–100)

## 2016-05-16 SURGERY — SIGMOIDOSCOPY, FLEXIBLE
Anesthesia: Monitor Anesthesia Care

## 2016-05-16 MED ORDER — CARVEDILOL 6.25 MG PO TABS
6.2500 mg | ORAL_TABLET | Freq: Two times a day (BID) | ORAL | Status: DC
  Administered 2016-05-16 – 2016-05-19 (×5): 6.25 mg via ORAL
  Filled 2016-05-16 (×6): qty 1

## 2016-05-16 MED ORDER — PROPOFOL 500 MG/50ML IV EMUL
INTRAVENOUS | Status: DC | PRN
  Administered 2016-05-16: 100 ug/kg/min via INTRAVENOUS

## 2016-05-16 MED ORDER — ACETAMINOPHEN 650 MG RE SUPP: 650.0000 mg | Freq: Four times a day (QID) | RECTAL | Status: DC | PRN

## 2016-05-16 MED ORDER — ASPIRIN 81 MG PO CHEW
81.0000 mg | CHEWABLE_TABLET | Freq: Every day | ORAL | Status: DC
  Administered 2016-05-16 – 2016-05-29 (×13): 81 mg via ORAL
  Filled 2016-05-16 (×13): qty 1

## 2016-05-16 MED ORDER — ACETAMINOPHEN 325 MG PO TABS
650.0000 mg | ORAL_TABLET | Freq: Four times a day (QID) | ORAL | Status: DC | PRN
  Filled 2016-05-16: qty 2

## 2016-05-16 MED ORDER — FLEET ENEMA 7-19 GM/118ML RE ENEM
ENEMA | RECTAL | Status: AC
  Filled 2016-05-16: qty 1

## 2016-05-16 MED ORDER — DIGOXIN 125 MCG PO TABS
0.1250 mg | ORAL_TABLET | Freq: Every day | ORAL | Status: DC
  Administered 2016-05-16 – 2016-05-29 (×13): 0.125 mg via ORAL
  Filled 2016-05-16 (×14): qty 1

## 2016-05-16 MED ORDER — HEPARIN SODIUM (PORCINE) 5000 UNIT/ML IJ SOLN
5000.0000 [IU] | Freq: Three times a day (TID) | INTRAMUSCULAR | Status: DC
  Administered 2016-05-16 – 2016-05-17 (×3): 5000 [IU] via SUBCUTANEOUS
  Filled 2016-05-16 (×3): qty 1

## 2016-05-16 MED ORDER — ONDANSETRON HCL 4 MG/2ML IJ SOLN
4.0000 mg | Freq: Once | INTRAMUSCULAR | Status: AC
  Administered 2016-05-16: 4 mg via INTRAVENOUS

## 2016-05-16 MED ORDER — LISINOPRIL 5 MG PO TABS
2.5000 mg | ORAL_TABLET | Freq: Every day | ORAL | Status: DC
  Administered 2016-05-16 – 2016-05-29 (×12): 2.5 mg via ORAL
  Filled 2016-05-16 (×12): qty 1

## 2016-05-16 MED ORDER — ONDANSETRON HCL 4 MG PO TABS
4.0000 mg | ORAL_TABLET | Freq: Four times a day (QID) | ORAL | Status: DC | PRN
  Administered 2016-05-23 (×2): 4 mg via ORAL
  Filled 2016-05-16 (×2): qty 1

## 2016-05-16 MED ORDER — PHENYLEPHRINE HCL 10 MG/ML IJ SOLN
INTRAMUSCULAR | Status: DC | PRN
  Administered 2016-05-16 (×3): 80 ug via INTRAVENOUS

## 2016-05-16 MED ORDER — NICOTINE POLACRILEX 2 MG MT GUM
2.0000 mg | CHEWING_GUM | OROMUCOSAL | Status: DC | PRN
Start: 2016-05-16 — End: 2016-05-29
  Filled 2016-05-16: qty 1

## 2016-05-16 MED ORDER — ONDANSETRON HCL 4 MG/2ML IJ SOLN
INTRAMUSCULAR | Status: AC
  Filled 2016-05-16: qty 2

## 2016-05-16 MED ORDER — DICYCLOMINE HCL 10 MG PO CAPS
20.0000 mg | ORAL_CAPSULE | Freq: Once | ORAL | Status: AC
  Administered 2016-05-16: 20 mg via ORAL
  Filled 2016-05-16: qty 2

## 2016-05-16 MED ORDER — DEXTROSE-NACL 5-0.45 % IV SOLN
INTRAVENOUS | Status: DC
  Administered 2016-05-16 – 2016-05-21 (×8): via INTRAVENOUS

## 2016-05-16 MED ORDER — SODIUM CHLORIDE 0.9 % IV SOLN
INTRAVENOUS | Status: DC
  Administered 2016-05-16: 12:00:00 via INTRAVENOUS

## 2016-05-16 MED ORDER — FLEET ENEMA 7-19 GM/118ML RE ENEM
1.0000 | ENEMA | Freq: Once | RECTAL | Status: AC
  Administered 2016-05-16: 1 via RECTAL

## 2016-05-16 MED ORDER — ATORVASTATIN CALCIUM 80 MG PO TABS
80.0000 mg | ORAL_TABLET | Freq: Every day | ORAL | Status: DC
  Administered 2016-05-16 – 2016-05-28 (×13): 80 mg via ORAL
  Filled 2016-05-16 (×13): qty 1

## 2016-05-16 MED ORDER — METOCLOPRAMIDE HCL 10 MG PO TABS
10.0000 mg | ORAL_TABLET | Freq: Once | ORAL | Status: AC
  Administered 2016-05-16: 10 mg via ORAL
  Filled 2016-05-16: qty 1

## 2016-05-16 MED ORDER — FENTANYL CITRATE (PF) 100 MCG/2ML IJ SOLN
25.0000 ug | INTRAMUSCULAR | Status: DC | PRN
  Administered 2016-05-17 – 2016-05-18 (×4): 50 ug via INTRAVENOUS
  Filled 2016-05-16 (×4): qty 2

## 2016-05-16 MED ORDER — ONDANSETRON HCL 4 MG/2ML IJ SOLN
4.0000 mg | Freq: Four times a day (QID) | INTRAMUSCULAR | Status: DC | PRN
  Administered 2016-05-16 – 2016-05-18 (×5): 4 mg via INTRAVENOUS
  Filled 2016-05-16 (×5): qty 2

## 2016-05-16 SURGICAL SUPPLY — 22 items

## 2016-05-16 NOTE — Interval H&P Note (Signed)
History and Physical Interval Note:  05/16/2016 10:35 AM  Christopher [name]  has presented today for surgery, with the diagnosis of descending colon mass  The various methods of treatment have been discussed with the patient and family. After consideration of risks, benefits and other options for treatment, the patient has consented to  Procedure(s): COLONOSCOPY WITH PROPOFOL (N/A) as a surgical intervention .  The patient's history has been reviewed, patient examined, no change in status, stable for surgery.  I have reviewed the patient's chart and labs.  Questions were answered to the patient's satisfaction.     See my H+P this AM for update.  [name]

## 2016-05-16 NOTE — Progress Notes (Signed)
Report called and given to Ander Purpura, RN

## 2016-05-16 NOTE — Anesthesia Preprocedure Evaluation (Addendum)
Anesthesia Evaluation  Patient identified by MRN, date of birth, ID band Patient awake    Reviewed: Allergy & Precautions, H&P , NPO status , [name] Chart, lab work & pertinent test results, reviewed documented beta blocker date and time   Airway Mallampati: II  TM Distance: >3 FB Neck ROM: Full    Dental no notable dental hx. (+) Teeth Intact, Dental Advisory Given   Pulmonary COPD, former smoker,    Pulmonary exam normal breath sounds clear to auscultation       Cardiovascular + CAD, + Past MI, + Cardiac Stents and +CHF  + Cardiac Defibrillator  Rhythm:Regular Rate:Normal     Neuro/Psych Anxiety negative neurological ROS  negative psych ROS   GI/Hepatic negative GI ROS, Neg liver ROS,   Endo/Other  negative endocrine ROS  Renal/GU Renal InsufficiencyRenal diseasenegative Renal ROS  negative genitourinary   Musculoskeletal   Abdominal   Peds  Hematology negative hematology ROS (+) anemia ,   Anesthesia Other Findings   Reproductive/Obstetrics negative OB ROS                           Anesthesia Physical Anesthesia Plan  ASA: IV  Anesthesia Plan: MAC   Post-op Pain Management:    Induction: Intravenous  Airway Management Planned: Simple Face Mask  Additional Equipment:   Intra-op Plan:   Post-operative Plan:   Informed Consent: I have reviewed the patients History and Physical, chart, labs and discussed the procedure including the risks, benefits and alternatives for the proposed anesthesia with the patient or authorized representative who has indicated his/her understanding and acceptance.   Dental advisory given  Plan Discussed with: CRNA  Anesthesia Plan Comments:        Anesthesia Quick Evaluation

## 2016-05-16 NOTE — Consult Note (Signed)
[facility] Surgery Consult/Admission Note  Christopher [name] [birth year]  [id number].    Requesting MD: Dr. Loletha Carrow, GI Chief Complaint/Reason for Consult: Obstructing colon mass  HPI:  Pt is a 56 year old male with a history of COPD, CAD, MI, cardiac stents, CHF, anxiety, anemia, AICD present, who had an elective sigmoidoscopy today by Dr. Loletha Carrow. Pt states he has been having issues with his bowels since 2016. It first started out with blood in his stools. Since October of 2017 pt was having irregular bowel movements with increased "pulling" sensation before having a BM or flatus. He has intermittent abdominal pain that is non radiating, worse with eating or drinking located periumbical. Bowel movements having been small and less often. He was having BM's and passing gas up until 2 days ago. Last night pt was taking bowel prep when he experience severe abdominal pain, nausea and vomiting. Associated chills. He presented to the ED and was sent home and told to go to his scheduled sigmoidoscopy at 8am. We were consulted because his sigmoidoscopy revealed a completely obstruction mass. Pt denies blood in his vomit or fever.  ROS:  Review of Systems  Constitutional: Positive for chills. Negative for fever and weight loss.  HENT: Negative for sore throat.   Respiratory: Positive for shortness of breath (intermittently on exertion ). Negative for cough.   Cardiovascular: Negative for chest pain and leg swelling.  Gastrointestinal: Positive for abdominal pain, blood in stool, constipation, melena, nausea and vomiting.  Skin: Negative for itching and rash.  Neurological: Negative for dizziness, loss of consciousness and headaches.  All other systems reviewed and are negative.    Family History  Problem Relation Age of Onset  . Emphysema Father   . Heart disease Mother   . Dementia Mother   . Diabetes Mother   . Pancreatic cancer Maternal Grandmother   . Lung cancer Maternal Grandmother   .  Cancer Neg Hx     Past Medical History:  Diagnosis Date  . Automatic implantable cardioverter-defibrillator in situ    MDT Aug 2015 Dr. Lovena Le  . CAD (coronary artery disease)    PCI to LAD with DES May 2015  . CAD (coronary artery disease), native coronary artery 07/03/2013   Cath 06/20/13  Normal left main, occluded LAD, occluded RCA, 50% circ EF 15%  3.0 x28 and 3.0 x 8 mm Xience stent Dr. Tamala Julian  To LAD   . CHF (congestive heart failure) ([place]) 05/2013   . Chronic kidney disease   . Chronic systolic CHF (congestive heart failure) ([facility])    ECHO 08/05/13  EF 30%  Anterior akinesis and inferior hypokinesis   . COPD (chronic obstructive pulmonary disease) ([place])   . Heavy smoker   . Hyperlipidemia   . Myocardial infarction ([place]) 04/29/2013  . Thrombocytopenia ([place]) 12/27/2014   Chronic      Past Surgical History:  Procedure Laterality Date  . CARDIAC CATHETERIZATION  05/2013   . CARDIAC CATHETERIZATION N/A 12/28/2014   Procedure: Left Heart Cath and Coronary Angiography;  Surgeon: [name], MD;  Location: [place] CV LAB;  Service: Cardiovascular;  Laterality: N/A;  . CARDIAC CATHETERIZATION N/A 12/28/2014   Procedure: Coronary Stent Intervention;  Surgeon: [name], MD;  Location: [place] CV LAB;  Service: Cardiovascular;  Laterality: N/A;  . CORONARY ANGIOPLASTY  05/2013   . ICD placement  09/07/2013   . IMPLANTABLE CARDIOVERTER DEFIBRILLATOR IMPLANT N/A 09/07/2013   Procedure: IMPLANTABLE CARDIOVERTER DEFIBRILLATOR IMPLANT;  Surgeon: Carleene Overlie  Peyton Najjar, MD;  Location: [facility] CATH LAB;  Service: Cardiovascular;  Laterality: N/A;  . INTRA-AORTIC BALLOON PUMP INSERTION  06/20/2013   Procedure: INTRA-AORTIC BALLOON PUMP INSERTION;  Surgeon: Sinclair Grooms, MD;  Location: [facility] CATH LAB;  Service: Cardiovascular;;  . LEFT HEART CATHETERIZATION WITH CORONARY ANGIOGRAM N/A 06/20/2013   Procedure: LEFT HEART CATHETERIZATION WITH CORONARY ANGIOGRAM;  Surgeon: Sinclair Grooms, MD;   Location: [facility] CATH LAB;  Service: Cardiovascular;  Laterality: N/A;  . PERCUTANEOUS CORONARY STENT INTERVENTION (PCI-S)  06/20/2013   Procedure: PERCUTANEOUS CORONARY STENT INTERVENTION (PCI-S);  Surgeon: Sinclair Grooms, MD;  Location: [facility] CATH LAB;  Service: Cardiovascular;;    Social History:  reports that he quit smoking about 16 months ago. His smoking use included Cigarettes. He has a 45.00 pack-year smoking history. He has never used smokeless tobacco. He reports that he does not drink alcohol or use drugs.  Allergies:  Allergies  Allergen Reactions  . Celebrex [Celecoxib] Other (See Comments)    Caused his back to burn  . Contrast Media [Iodinated Diagnostic Agents] Other (See Comments)    Patient feels weakness    Medications Prior to Admission  Medication Sig Dispense Refill  . aspirin 81 MG chewable tablet Chew 1 tablet (81 mg total) by mouth daily. 30 tablet 5  . atorvastatin (LIPITOR) 80 MG tablet Take 1 tablet (80 mg total) by mouth daily at 6 PM. 30 tablet 5  . carvedilol (COREG) 6.25 MG tablet Take 1 tablet (6.25 mg total) by mouth 2 (two) times daily with a meal. 60 tablet 3  . digoxin (LANOXIN) 0.125 MG tablet Take 1 tablet (0.125 mg total) by mouth daily. 30 tablet 5  . ferrous sulfate 325 (65 FE) MG tablet Take 1 tablet (325 mg total) by mouth 2 (two) times daily with a meal. 60 tablet 3  . lisinopril (PRINIVIL,ZESTRIL) 2.5 MG tablet Take 1 tablet (2.5 mg total) by mouth daily. 30 tablet 5  . Na Sulfate-K Sulfate-Mg Sulf 17.5-3.13-1.6 GM/180ML SOLN Suprep-Use as directed 354 mL 0  . clopidogrel (PLAVIX) 75 MG tablet Take 75 mg by mouth daily.      Blood pressure 136/79, pulse 88, resp. rate 15, height 5\' 8"  (1.727 m), weight 158 lb (71.7 kg), SpO2 96 %.  Physical Exam  Constitutional: He is oriented to person, place, and time and well-developed, well-nourished, and in no distress. No distress.  Well appearing white male  HENT:  Head: Normocephalic and atraumatic.   Right Ear: External ear normal.  Left Ear: External ear normal.  Nose: Nose normal.  Mouth/Throat: Oropharynx is clear and moist. No oropharyngeal exudate.  Eyes: Conjunctivae and EOM are normal. Right eye exhibits no discharge. Left eye exhibits no discharge. No scleral icterus.  Pupils equal in size  Neck: Normal range of motion. Neck supple. No tracheal deviation present. No thyromegaly present.  Cardiovascular: Normal rate, regular rhythm and normal heart sounds.   Pulses:      Radial pulses are 2+ on the right side, and 2+ on the left side.       Dorsalis pedis pulses are 2+ on the right side, and 2+ on the left side.  Pulmonary/Chest: Effort normal and breath sounds normal. No respiratory distress. He has no wheezes. He has no rales.  Abdominal: Soft. Normal appearance. Bowel sounds are hyperactive. There is no hepatosplenomegaly.  Mild distention, generalized mild TTP  Musculoskeletal: Normal range of motion. He exhibits no edema, tenderness or deformity.  Neurological:  He is alert and oriented to person, place, and time. No cranial nerve deficit (grossly intact). GCS score is 15.  Skin: Skin is warm and dry. He is not diaphoretic.  Psychiatric: Mood and affect normal.  Nursing note and vitals reviewed.   Results for orders placed or performed during the hospital encounter of 05/16/16 (from the past 48 hour(s))  I-STAT, chem 8     Status: Abnormal   Collection Time: 05/16/16 10:16 AM  Result Value Ref Range   Sodium 140 135 - 145 mmol/L   Potassium 3.7 3.5 - 5.1 mmol/L   Chloride 101 101 - 111 mmol/L   BUN 18 6 - 20 mg/dL   Creatinine, Ser 1.30 (H) 0.61 - 1.24 mg/dL   Glucose, Bld 88 65 - 99 mg/dL   Calcium, Ion 1.12 (L) 1.15 - 1.40 mmol/L   TCO2 26 0 - 100 mmol/L   Hemoglobin 12.6 (L) 13.0 - 17.0 g/dL   HCT 37.0 (L) 39.0 - 52.0 %   No results found.    Assessment/Plan  Obstructing descending colon mass likely malignant - pt will need partial colectomy  - pt will  need cardiac clearance prior to surgery - will likely take pt to OR on Friday - NPO and no NGT unless pt is actively vomiting  Thank you for the consult   Kalman Drape, [facility] Surgery 05/16/2016, 2:07 PM Pager: [phone] Consults: [phone] Mon-Fri 7:00 am-4:30 pm Sat-Sun 7:00 am-11:30 am

## 2016-05-16 NOTE — Anesthesia Postprocedure Evaluation (Signed)
Anesthesia Post Note  Patient: [name]  Procedure(s) Performed: Procedure(s) (LRB): FLEXIBLE SIGMOIDOSCOPY (N/A)  Patient location during evaluation: PACU Anesthesia Type: MAC Level of consciousness: awake and alert Pain management: pain level controlled Vital Signs Assessment: post-procedure vital signs reviewed and stable Respiratory status: spontaneous breathing Cardiovascular status: stable Anesthetic complications: no       Last Vitals:  Vitals:   05/16/16 1235 05/16/16 1255  BP: 116/73 136/79  Pulse: 76 88  Resp: (!) 22 15    Last Pain:  Vitals:   05/16/16 1225  TempSrc: Oral                 Nolon Nations

## 2016-05-16 NOTE — Interval H&P Note (Signed)
History and Physical Interval Note:  05/16/2016 10:46 AM  Christopher [name]  has presented today for surgery, with the diagnosis of descending colon mass  The various methods of treatment have been discussed with the patient and family. After consideration of risks, benefits and other options for treatment, the patient has consented to  Procedure(s): FLEXIBLE SIGMOIDOSCOPY (N/A) as a surgical intervention .  The patient's history has been reviewed, patient examined, no change in status, stable for surgery.  I have reviewed the patient's chart and labs.  Questions were answered to the patient's satisfaction.     Istat reveals normal K+ of 3.7, normal sodium, creatinine 1.3  [name]

## 2016-05-16 NOTE — ED Provider Notes (Signed)
[facility] DEPT Provider Note   CSN: [id number] Arrival date & time: 05/16/16  0345     History   Chief Complaint Chief Complaint  Patient presents with  . Abdominal Pain    HPI Christopher [name] is a 56 y.o. male with a past medical history of coronary artery disease, CHF, chronic kidney disease, hyperlipidemia, recent colon masses again today with abdominal pain. Patient is scheduled to have a colonoscopy performed this morning at 9:30 AM. He was supposed to take a bowel prep however he developed abdominal cramping nausea and vomiting 2 hours after the initial prep. He subsequently he came afraid to take the second dose. Currently states his symptoms are improving. He denies any fevers. He's not had any bowel movements since taking the prep. There are no further complaints.  10 Systems reviewed and are negative for acute change except as noted in the HPI.   HPI  Past Medical History:  Diagnosis Date  . Automatic implantable cardioverter-defibrillator in situ    MDT Aug 2015 Dr. Lovena Le  . CAD (coronary artery disease)    PCI to LAD with DES May 2015  . CAD (coronary artery disease), native coronary artery 07/03/2013   Cath 06/20/13  Normal left main, occluded LAD, occluded RCA, 50% circ EF 15%  3.0 x28 and 3.0 x 8 mm Xience stent Dr. Tamala Julian  To LAD   . CHF (congestive heart failure) ([place]) 05/2013   . Chronic kidney disease   . Chronic systolic CHF (congestive heart failure) (HCC)    ECHO 08/05/13  EF 30%  Anterior akinesis and inferior hypokinesis   . COPD (chronic obstructive pulmonary disease) ([place])   . Heavy smoker   . Hyperlipidemia   . Myocardial infarction ([place]) 04/29/2013  . Thrombocytopenia ([place]) 12/27/2014   Chronic      Patient Active Problem List   Diagnosis Date Noted  . Colonic mass 05/11/2016  . Abnormal CT scan, colon 05/11/2016  . Antiplatelet or antithrombotic long-term use 05/11/2016  . Anemia 05/03/2016  . Melena 05/03/2016  . Anxiety state 01/17/2015    . Cardiomyopathy, ischemic   . Thrombocytopenia ([place]) 12/27/2014  . AICD (automatic cardioverter/defibrillator) present   . Financial difficulties 10/01/2013  . CAD (coronary artery disease), native coronary artery 07/03/2013  . Chronic systolic CHF (congestive heart failure) ([place])   . Hyperlipidemia   . Old anterior myocardial infarction 06/20/2013    Past Surgical History:  Procedure Laterality Date  . CARDIAC CATHETERIZATION  05/2013   . CARDIAC CATHETERIZATION N/A 12/28/2014   Procedure: Left Heart Cath and Coronary Angiography;  Surgeon: [name], MD;  Location: [facility];  Service: Cardiovascular;  Laterality: N/A;  . CARDIAC CATHETERIZATION N/A 12/28/2014   Procedure: Coronary Stent Intervention;  Surgeon: [name], MD;  Location: [facility];  Service: Cardiovascular;  Laterality: N/A;  . CORONARY ANGIOPLASTY  05/2013   . ICD placement  09/07/2013   . IMPLANTABLE CARDIOVERTER DEFIBRILLATOR IMPLANT N/A 09/07/2013   Procedure: IMPLANTABLE CARDIOVERTER DEFIBRILLATOR IMPLANT;  Surgeon: [name], MD;  Location: [facility] CATH LAB;  Service: Cardiovascular;  Laterality: N/A;  . INTRA-AORTIC BALLOON PUMP INSERTION  06/20/2013   Procedure: INTRA-AORTIC BALLOON PUMP INSERTION;  Surgeon: Sinclair Grooms, MD;  Location: [facility] CATH LAB;  Service: Cardiovascular;;  . LEFT HEART CATHETERIZATION WITH CORONARY ANGIOGRAM N/A 06/20/2013   Procedure: LEFT HEART CATHETERIZATION WITH CORONARY ANGIOGRAM;  Surgeon: Sinclair Grooms, MD;  Location: [facility] CATH LAB;  Service: Cardiovascular;  Laterality: N/A;  . PERCUTANEOUS CORONARY STENT INTERVENTION (PCI-S)  06/20/2013   Procedure: PERCUTANEOUS CORONARY STENT INTERVENTION (PCI-S);  Surgeon: Sinclair Grooms, MD;  Location: [facility] CATH LAB;  Service: Cardiovascular;;       Home Medications    Prior to Admission medications   Medication Sig Start Date End Date Taking? Authorizing Provider  aspirin 81 MG chewable tablet Chew 1  tablet (81 mg total) by mouth daily. 09/09/13   Theodis Blaze, MD  atorvastatin (LIPITOR) 80 MG tablet Take 1 tablet (80 mg total) by mouth daily at 6 PM. 09/09/13   Theodis Blaze, MD  carvedilol (COREG) 6.25 MG tablet Take 1 tablet (6.25 mg total) by mouth 2 (two) times daily with a meal. 01/17/15   [name], MD  clopidogrel (PLAVIX) 75 MG tablet Take 75 mg by mouth daily.    Historical Provider, MD  digoxin (LANOXIN) 0.125 MG tablet Take 1 tablet (0.125 mg total) by mouth daily. 09/09/13   Theodis Blaze, MD  ferrous sulfate 325 (65 FE) MG tablet Take 1 tablet (325 mg total) by mouth 2 (two) times daily with a meal. 05/03/16   Josalyn Funches, MD  lisinopril (PRINIVIL,ZESTRIL) 2.5 MG tablet Take 1 tablet (2.5 mg total) by mouth daily. 09/09/13   Theodis Blaze, MD  Na Sulfate-K Sulfate-Mg Sulf 17.5-3.13-1.6 GM/180ML SOLN Suprep-Use as directed 05/11/16   Laban Emperor Zehr, PA-C    Family History Family History  Problem Relation Age of Onset  . Emphysema Father   . Heart disease Mother   . Dementia Mother   . Diabetes Mother   . Pancreatic cancer Maternal Grandmother   . Lung cancer Maternal Grandmother   . Cancer Neg Hx     Social History Social History  Substance Use Topics  . Smoking status: Former Smoker    Packs/day: 1.50    Years: 30.00    Types: Cigarettes    Quit date: 12/22/2014  . Smokeless tobacco: Never Used  . Alcohol use No     Comment: quit in 08/2013      Allergies   Celebrex [celecoxib]   Review of Systems Review of Systems   Physical Exam Updated Vital Signs BP 112/86 (BP Location: Left Arm)   Pulse 96   Temp 97.7 F (36.5 C) (Oral)   Resp 16   Ht 5\' 8"  (1.727 m)   Wt 158 lb (71.7 kg)   SpO2 100%   BMI 24.02 kg/m   Physical Exam  Constitutional: He is oriented to person, place, and time. Vital signs are normal. He appears well-developed and well-nourished.  Non-toxic appearance. He does not appear ill. No distress.  HENT:  Head: Normocephalic  and atraumatic.  Nose: Nose normal.  Mouth/Throat: Oropharynx is clear and moist. No oropharyngeal exudate.  Eyes: Conjunctivae and EOM are normal. Pupils are equal, round, and reactive to light. No scleral icterus.  Neck: Normal range of motion. Neck supple. No tracheal deviation, no edema, no erythema and normal range of motion present. No thyroid mass and no thyromegaly present.  Cardiovascular: Normal rate, regular rhythm, S1 normal, S2 normal, normal heart sounds, intact distal pulses and normal pulses.  Exam reveals no gallop and no friction rub.   No murmur heard. Pulmonary/Chest: Effort normal and breath sounds normal. No respiratory distress. He has no wheezes. He has no rhonchi. He has no rales.  Abdominal: Soft. Normal appearance and bowel sounds are normal. He exhibits no distension, no ascites and no mass.  There is no hepatosplenomegaly. There is no tenderness. There is no rebound, no guarding and no CVA tenderness.  Musculoskeletal: Normal range of motion. He exhibits no edema or tenderness.  Lymphadenopathy:    He has no cervical adenopathy.  Neurological: He is alert and oriented to person, place, and time. He has normal strength. No cranial nerve deficit or sensory deficit.  Skin: Skin is warm, dry and intact. No petechiae and no rash noted. He is not diaphoretic. No erythema. No pallor.  Nursing note and vitals reviewed.    ED Treatments / Results  Labs (all labs ordered are listed, but only abnormal results are displayed) Labs Reviewed - No data to display  EKG  EKG Interpretation None       Radiology No results found.  Procedures Procedures (including critical care time)  Medications Ordered in ED Medications  metoCLOPramide (REGLAN) tablet 10 mg (10 mg Oral Given 05/16/16 0524)  dicyclomine (BENTYL) capsule 20 mg (20 mg Oral Given 05/16/16 0524)     Initial Impression / Assessment and Plan / ED Course  I have reviewed the triage vital signs and the  nursing notes.  Pertinent labs & imaging results that were available during my care of the patient were reviewed by me and considered in my medical decision making (see chart for details).     Patient presents emergency department for abdominal pain and vomiting after this procedure. He currently feels better after Reglan and Bentyl. I spoke with [facility] gastroenterology on-call who recommends for the patient to come in early for his colonoscopy so they can give him an enema. Of note, patient likely took the enema oral although he tells me that it was an oral prep. He'll be discharged with instructions to report to the GI suite around 8 AM. He demonstrated understanding of this plan. He appears well-developed acute distress, vital signs were within his normal limits and he is safe for discharge.  Final Clinical Impressions(s) / ED Diagnoses   Final diagnoses:  None    New Prescriptions New Prescriptions   No medications on file     Everlene Balls, MD 05/16/16 [phone]

## 2016-05-16 NOTE — H&P (Signed)
History:  This patient presents for endoscopic testing for obstructing colon mass. See 05/11/16 office note for details  Unfortunately, the patient developed abdominal pain and distension after his first dose of bowel prep last night. Came to [facility] ED, deemed stable (no labs/imaging to review), sent home to come to endo lab this AM. He has not Cp or SOB now.  Abd pain resolved, still bloated.  [name] Referring physician: Minerva Ends, MD  Past Medical History: Past Medical History:  Diagnosis Date  . Automatic implantable cardioverter-defibrillator in situ    MDT Aug 2015 Dr. Lovena Le  . CAD (coronary artery disease)    PCI to LAD with DES May 2015  . CAD (coronary artery disease), native coronary artery 07/03/2013   Cath 06/20/13  Normal left main, occluded LAD, occluded RCA, 50% circ EF 15%  3.0 x28 and 3.0 x 8 mm Xience stent Dr. Tamala Julian  To LAD   . CHF (congestive heart failure) ([place]) 05/2013   . Chronic kidney disease   . Chronic systolic CHF (congestive heart failure) (HCC)    ECHO 08/05/13  EF 30%  Anterior akinesis and inferior hypokinesis   . COPD (chronic obstructive pulmonary disease) ([place])   . Heavy smoker   . Hyperlipidemia   . Myocardial infarction ([place]) 04/29/2013  . Thrombocytopenia ([place]) 12/27/2014   Chronic       Past Surgical History: Past Surgical History:  Procedure Laterality Date  . CARDIAC CATHETERIZATION  05/2013   . CARDIAC CATHETERIZATION N/A 12/28/2014   Procedure: Left Heart Cath and Coronary Angiography;  Surgeon: [name], MD;  Location: [facility] CV LAB;  Service: Cardiovascular;  Laterality: N/A;  . CARDIAC CATHETERIZATION N/A 12/28/2014   Procedure: Coronary Stent Intervention;  Surgeon: [name], MD;  Location: [facility] CV LAB;  Service: Cardiovascular;  Laterality: N/A;  . CORONARY ANGIOPLASTY  05/2013   . ICD placement  09/07/2013   . IMPLANTABLE CARDIOVERTER DEFIBRILLATOR IMPLANT N/A 09/07/2013   Procedure: IMPLANTABLE  CARDIOVERTER DEFIBRILLATOR IMPLANT;  Surgeon: [name], MD;  Location: [facility] CATH LAB;  Service: Cardiovascular;  Laterality: N/A;  . INTRA-AORTIC BALLOON PUMP INSERTION  06/20/2013   Procedure: INTRA-AORTIC BALLOON PUMP INSERTION;  Surgeon: Sinclair Grooms, MD;  Location: [facility] CATH LAB;  Service: Cardiovascular;;  . LEFT HEART CATHETERIZATION WITH CORONARY ANGIOGRAM N/A 06/20/2013   Procedure: LEFT HEART CATHETERIZATION WITH CORONARY ANGIOGRAM;  Surgeon: Sinclair Grooms, MD;  Location: [facility] CATH LAB;  Service: Cardiovascular;  Laterality: N/A;  . PERCUTANEOUS CORONARY STENT INTERVENTION (PCI-S)  06/20/2013   Procedure: PERCUTANEOUS CORONARY STENT INTERVENTION (PCI-S);  Surgeon: Sinclair Grooms, MD;  Location: [facility] CATH LAB;  Service: Cardiovascular;;    Allergies: Allergies  Allergen Reactions  . Celebrex [Celecoxib] Other (See Comments)    Caused his back to burn    Outpatient Meds: No current facility-administered medications for this encounter.       ___________________________________________________________________ Objective   Exam:  There were no vitals taken for this visit.   CV: RRR without murmur, S1/S2, no JVD, no peripheral edema  Resp: clear to auscultation bilaterally, normal RR and effort noted  GI: softly and moderately distended, no tenderness, hypoactive bowel sounds. No guarding or palpable organomegaly noted.  Neuro: awake, alert and oriented x 3. Normal gross motor function and fluent speech   Assessment:  Obstructing left colon mass.  Prep has not worked its way through, causing distension.  Exam otherwise benign.  I do not  believe he has compromised bowel or cardiopulmonary decompensation.  Plan:  Sigmoidoscopy with minimal sedation and CO2 insufflation.  Fleets and tap water enema to be given. Discussed case at length with Dr Ola Spurr of anesthesia, who is evaluating patient now.  Planning elective medical admission afterwards for surgical and  cardiology consults of this probable colon malignancy.   [name]

## 2016-05-16 NOTE — Op Note (Signed)
[facility] Patient Name: Christopher [name] Procedure Date : 05/16/2016 MRN: [id number] Attending MD: Estill Cotta. Loletha Carrow , MD Date of Birth: 01-22-1961 CSN: [id number] Age: 56 Admit Type: Outpatient Procedure:                Colonoscopy Indications:              Abdominal pain in the left lower quadrant, Colon                            mass, Constipation Providers:                [name] L. Loletha Carrow, MD, Carmie End, RN, Alfonso Patten, Technician, Judeth Cornfield, CRNA Referring MD:              Medicines:                Monitored Anesthesia Care Complications:            No immediate complications. Estimated Blood Loss:     Estimated blood loss was minimal. Procedure:                Pre-Anesthesia Assessment:                           - Prior to the procedure, a History and Physical                            was performed, and patient medications and                            allergies were reviewed. The patient's tolerance of                            previous anesthesia was also reviewed. The risks                            and benefits of the procedure and the sedation                            options and risks were discussed with the patient.                            All questions were answered, and informed consent                            was obtained. Prior Anticoagulants: The patient has                            taken Plavix (clopidogrel), last dose was 5 days                            prior to procedure. ASA Grade Assessment: III - A  patient with severe systemic disease. After                            reviewing the risks and benefits, the patient was                            deemed in satisfactory condition to undergo the                            procedure.                           After obtaining informed consent, the colonoscope                            was passed under direct vision.  Throughout the                            procedure, the patient's blood pressure, pulse, and                            oxygen saturations were monitored continuously. The                            EC-[id number] ([id number]) scope was introduced through                            the anus and advanced to the the descending colon                            to examine a mass. This was the intended extent.                            The colonoscopy was performed without difficulty.                            The patient tolerated the procedure well. The                            quality of the bowel preparation was good. The                            rectum was photographed. The bowel preparation used                            was Fleet enema. Scope In: Scope Out: Findings:      The perianal and digital rectal examinations were normal.      A fungating and ulcerated completely obstructing large mass was found in       the distal descending colon. The mass was circumferential. No bleeding       was present. This was biopsied with a cold forceps for histology.      A few diverticula were found in the left colon. Impression:               -  Likely malignant completely obstructing tumor in                            the distal descending colon. Biopsied.                           - Diverticulosis in the left colon. Moderate Sedation:      MAC sedation used Recommendation:           - Admit the patient to hospital ward prior to                            surgery.                           - NPO.                           - Continue present medications.                           - Await pathology results.                           - Surgical and cardiology consults                           - No recommendation at this time regarding repeat                            colonoscopy due to pending surgical plan and                            outcome. Eventual full colonoscopy necessary. Procedure  Code(s):        --- Professional ---                           [phone], 41, Colonoscopy, flexible; with biopsy,                            single or multiple Diagnosis Code(s):        --- Professional ---                           D49.0, Neoplasm of unspecified behavior of                            digestive system                           R10.32, Left lower quadrant pain                           K63.89, Other specified diseases of intestine                           K59.00, Constipation, unspecified  K57.30, Diverticulosis of large intestine without                            perforation or abscess without bleeding CPT copyright 2016 [employer]. All rights reserved. The codes documented in this report are preliminary and upon coder review may  be revised to meet current compliance requirements. Steward Sames L. Loletha Carrow, MD 05/16/2016 12:53:31 PM This report has been signed electronically. Number of Addenda: 0

## 2016-05-16 NOTE — H&P (View-Only) (Signed)
05/11/2016 Christopher [name] [id number] 10/25/1960   HISTORY OF PRESENT ILLNESS:  This is a 56 year old male who is new to our practice.  He was referred here by Dr. [name], for evaluation regarding an abnormal CT scan.  Patient tells me that he has been experiencing abdominal discomfort, which he describes as a lot of gas, some rectal bleeding, and a change in bowel habits for some time now. He has had a lot of cardiac issues over the last 1.5-2 years as well. He has an ejection fraction of 10% due to ischemic cardiomyopathy. Has an ICD in place. Is on Plavix for his coronary artery disease. He had a CT scan performed 2 days ago, which showed a probable descending colon carcinoma with masslike thickening in that region and extensive proximal stool retention. There is mesocolic adenopathy as well.  He tells me that he is in fact moving his bowels. He sometimes skips a day or so, however. After having the CT scan performed he had a lot of diarrhea from the oral contrast.    Past Medical History:  Diagnosis Date  . Automatic implantable cardioverter-defibrillator in situ    MDT Aug 2015 Dr. Lovena Le  . CAD (coronary artery disease)    PCI to LAD with DES May 2015  . CAD (coronary artery disease), native coronary artery 07/03/2013   Cath 06/20/13  Normal left main, occluded LAD, occluded RCA, 50% circ EF 15%  3.0 x28 and 3.0 x 8 mm Xience stent Dr. Tamala Julian  To LAD   . CHF (congestive heart failure) ([place]) 05/2013   . Chronic kidney disease   . Chronic systolic CHF (congestive heart failure) (HCC)    ECHO 08/05/13  EF 30%  Anterior akinesis and inferior hypokinesis   . COPD (chronic obstructive pulmonary disease) ([place])   . Heavy smoker   . Hyperlipidemia   . Myocardial infarction 04/29/2013  . Thrombocytopenia ([place]) 12/27/2014   Chronic     Past Surgical History:  Procedure Laterality Date  . CARDIAC CATHETERIZATION  05/2013   . CARDIAC CATHETERIZATION N/A 12/28/2014   Procedure: Left Heart  Cath and Coronary Angiography;  Surgeon: [name], MD;  Location: [place] [facility] LAB;  Service: Cardiovascular;  Laterality: N/A;  . CARDIAC CATHETERIZATION N/A 12/28/2014   Procedure: Coronary Stent Intervention;  Surgeon: [name], MD;  Location: [place] [facility] LAB;  Service: Cardiovascular;  Laterality: N/A;  . CORONARY ANGIOPLASTY  05/2013   . ICD placement  09/07/2013   . IMPLANTABLE CARDIOVERTER DEFIBRILLATOR IMPLANT N/A 09/07/2013   Procedure: IMPLANTABLE CARDIOVERTER DEFIBRILLATOR IMPLANT;  Surgeon: [name], MD;  Location: [facility] CATH LAB;  Service: Cardiovascular;  Laterality: N/A;  . INTRA-AORTIC BALLOON PUMP INSERTION  06/20/2013   Procedure: INTRA-AORTIC BALLOON PUMP INSERTION;  Surgeon: Sinclair Grooms, MD;  Location: [facility] CATH LAB;  Service: Cardiovascular;;  . LEFT HEART CATHETERIZATION WITH CORONARY ANGIOGRAM N/A 06/20/2013   Procedure: LEFT HEART CATHETERIZATION WITH CORONARY ANGIOGRAM;  Surgeon: Sinclair Grooms, MD;  Location: [facility] CATH LAB;  Service: Cardiovascular;  Laterality: N/A;  . PERCUTANEOUS CORONARY STENT INTERVENTION (PCI-S)  06/20/2013   Procedure: PERCUTANEOUS CORONARY STENT INTERVENTION (PCI-S);  Surgeon: Sinclair Grooms, MD;  Location: [facility] CATH LAB;  Service: Cardiovascular;;    reports that he quit smoking about 16 months ago. His smoking use included Cigarettes. He has a 45.00 pack-year smoking history. He has never used smokeless tobacco. He reports that he does not drink  alcohol or use drugs. family history includes Dementia in his mother; Diabetes in his mother; Emphysema in his father; Heart disease in his mother; Lung cancer in his maternal grandmother; Pancreatic cancer in his maternal grandmother. Allergies  Allergen Reactions  . Celebrex [Celecoxib] Other (See Comments)    Caused his back to burn      Outpatient Encounter Prescriptions as of 05/11/2016  Medication Sig  . aspirin 81 MG chewable tablet Chew 1 tablet (81 mg total) by mouth  daily.  Marland Kitchen atorvastatin (LIPITOR) 80 MG tablet Take 1 tablet (80 mg total) by mouth daily at 6 PM.  . carvedilol (COREG) 6.25 MG tablet Take 1 tablet (6.25 mg total) by mouth 2 (two) times daily with a meal.  . clopidogrel (PLAVIX) 75 MG tablet Take 75 mg by mouth daily.  . digoxin (LANOXIN) 0.125 MG tablet Take 1 tablet (0.125 mg total) by mouth daily.  . ferrous sulfate 325 (65 FE) MG tablet Take 1 tablet (325 mg total) by mouth 2 (two) times daily with a meal.  . lisinopril (PRINIVIL,ZESTRIL) 2.5 MG tablet Take 1 tablet (2.5 mg total) by mouth daily.  . [DISCONTINUED] albuterol (PROVENTIL HFA;VENTOLIN HFA) 108 (90 BASE) MCG/ACT inhaler Inhale 2 puffs into the lungs every 6 (six) hours as needed for wheezing or shortness of breath. (Patient not taking: Reported on 01/17/2015)   No facility-administered encounter medications on file as of 05/11/2016.      REVIEW OF SYSTEMS  : All other systems reviewed and negative except where noted in the History of Present Illness.   PHYSICAL EXAM: BP 100/60   Pulse 76   Ht 5\' 8"  (1.727 m)   Wt 158 lb (71.7 kg)   BMI 24.02 kg/m  General: Well developed white male in no acute distress Head: Normocephalic and atraumatic Eyes:  Sclerae anicteric, conjunctiva pink. Ears: Normal auditory acuity Lungs: Clear throughout to auscultation Heart: Regular rate and rhythm Abdomen: Soft, non-distended. Normal bowel sounds.  Mild left sided TTP.  Actually exam is quite benign. Rectal:  Will be done at the time of colonoscopy. Musculoskeletal: Symmetrical with no gross deformities  Skin: No lesions on visible extremities Extremities: No edema  Neurological: Alert oriented x 4, grossly non-focal Psychological:  Alert and cooperative. Normal mood and affect  ASSESSMENT AND PLAN: -56 year old male with abnormal CT scan that is extremely concerning for left colon cancer.  Has been experiencing some abdominal pain and rectal bleeding, but mostly complaints of a  lot of gas.  Is moving his bowels, but has noted a change in them. -CAD/ICM with EF of approximately 10%:  Had ICD.  Is on Plavix.  *This is an extremely complicated situation. The patient needs some type of evaluation/intervention regarding this tumor. He has extremely poor cardiac status, which makes him extremely high risk for any type of procedure/sedation. We are going to schedule him for colonoscopy at [facility] next week with Dr. Loletha Carrow.  He needs clearance from cardiology to hold his Plavix.  We are in the process of contacting Dr. Wynonia Lawman regarding this issue.   CC:  [name], MD

## 2016-05-16 NOTE — H&P (Signed)
History and Physical    Christopher [name] MWN:[id number] DOB: December 16, 1960 DOA: 05/16/2016  PCP: Minerva Ends, MD Patient coming from: Endoscopy  Chief Complaint: Bowel pain and mass  HPI: Christopher [name] is a 56 y.o. male with medical history significant of CAD/MI status post cardiac catheterization with stent placement and AICD, CHF, C KD, COPD, hyperlipidemia, tobacco use. Patient presented to [facility] emergency room on the morning of 05/16/2016 at approximately 06:00 with complaints of abdominal pain. Patient has chronic issues with abdominal pain but this became significantly worse when he attempted to take a bowel prep in anticipation of colonoscopy scheduled for the day of admission. Tissue bowel prep resulted in severe abdominal pain, nausea and vomiting. Lasted approximately 2 hours before coming to the ED. Symptoms improve after a period of time when not taking the bowel prep. Of note patient reports approximately a 6 month history of "problems with my bowels. "Patient only able to pass a very small amount of stool per bowel movement. Patient averages every OD bowel movements. Last bowel movement 2 days prior to admission. Tolerating oral intake without difficulty. Patient describes pain currently as a pulling sensation in his lower abdomen. Denies any fevers, unintentional weight loss, night sweats. She does endorse very dark tarry stools over the last several months.  ED Course: Limited ED course this patient was taken urgently to endoscopy suites for sigmoidoscopy.  Review of Systems: As per HPI otherwise 10 point review of systems negative.   Ambulatory Status:no restrictions  Past Medical History:  Diagnosis Date  . Automatic implantable cardioverter-defibrillator in situ    MDT Aug 2015 Dr. Lovena Le  . CAD (coronary artery disease)    PCI to LAD with DES May 2015  . CAD (coronary artery disease), native coronary artery 07/03/2013   Cath 06/20/13  Normal left main, occluded LAD,  occluded RCA, 50% circ EF 15%  3.0 x28 and 3.0 x 8 mm Xience stent Dr. Tamala Julian  To LAD   . CHF (congestive heart failure) ([place]) 05/2013   . Chronic kidney disease   . Chronic systolic CHF (congestive heart failure) (HCC)    ECHO 08/05/13  EF 30%  Anterior akinesis and inferior hypokinesis   . COPD (chronic obstructive pulmonary disease) ([place])   . Heavy smoker   . Hyperlipidemia   . Myocardial infarction ([place]) 04/29/2013  . Thrombocytopenia ([place]) 12/27/2014   Chronic      Past Surgical History:  Procedure Laterality Date  . CARDIAC CATHETERIZATION  05/2013   . CARDIAC CATHETERIZATION N/A 12/28/2014   Procedure: Left Heart Cath and Coronary Angiography;  Surgeon: [name], MD;  Location: [place] CV LAB;  Service: Cardiovascular;  Laterality: N/A;  . CARDIAC CATHETERIZATION N/A 12/28/2014   Procedure: Coronary Stent Intervention;  Surgeon: [name], MD;  Location: [place] CV LAB;  Service: Cardiovascular;  Laterality: N/A;  . CORONARY ANGIOPLASTY  05/2013   . ICD placement  09/07/2013   . IMPLANTABLE CARDIOVERTER DEFIBRILLATOR IMPLANT N/A 09/07/2013   Procedure: IMPLANTABLE CARDIOVERTER DEFIBRILLATOR IMPLANT;  Surgeon: [name], MD;  Location: [facility] CATH LAB;  Service: Cardiovascular;  Laterality: N/A;  . INTRA-AORTIC BALLOON PUMP INSERTION  06/20/2013   Procedure: INTRA-AORTIC BALLOON PUMP INSERTION;  Surgeon: Sinclair Grooms, MD;  Location: [facility] CATH LAB;  Service: Cardiovascular;;  . LEFT HEART CATHETERIZATION WITH CORONARY ANGIOGRAM N/A 06/20/2013   Procedure: LEFT HEART CATHETERIZATION WITH CORONARY ANGIOGRAM;  Surgeon: Sinclair Grooms, MD;  Location: [facility] CATH  LAB;  Service: Cardiovascular;  Laterality: N/A;  . PERCUTANEOUS CORONARY STENT INTERVENTION (PCI-S)  06/20/2013   Procedure: PERCUTANEOUS CORONARY STENT INTERVENTION (PCI-S);  Surgeon: Sinclair Grooms, MD;  Location: [facility] CATH LAB;  Service: Cardiovascular;;    Social History   Social History  . Marital status:  Single    Spouse name: N/A  . Number of children: 0  . Years of education: 12   Occupational History  . Unemployed     Social History Main Topics  . Smoking status: Former Smoker    Packs/day: 1.50    Years: 30.00    Types: Cigarettes    Quit date: 12/22/2014  . Smokeless tobacco: Never Used  . Alcohol use No     Comment: quit in 08/2013   . Drug use: No  . Sexual activity: Not Currently    Birth control/ protection: Condom     Comment: men    Other Topics Concern  . Not on file   Social History Narrative   Lives alone.    In a house trying to sell his home. Exercise: No.    Allergies  Allergen Reactions  . Celebrex [Celecoxib] Other (See Comments)    Caused his back to burn  . Contrast Media [Iodinated Diagnostic Agents] Other (See Comments)    Patient feels weakness    Family History  Problem Relation Age of Onset  . Emphysema Father   . Heart disease Mother   . Dementia Mother   . Diabetes Mother   . Pancreatic cancer Maternal Grandmother   . Lung cancer Maternal Grandmother   . Cancer Neg Hx     Prior to Admission medications   Medication Sig Start Date End Date Taking? Authorizing Provider  aspirin 81 MG chewable tablet Chew 1 tablet (81 mg total) by mouth daily. 09/09/13  Yes Theodis Blaze, MD  atorvastatin (LIPITOR) 80 MG tablet Take 1 tablet (80 mg total) by mouth daily at 6 PM. 09/09/13  Yes Theodis Blaze, MD  carvedilol (COREG) 6.25 MG tablet Take 1 tablet (6.25 mg total) by mouth 2 (two) times daily with a meal. 01/17/15  Yes Josalyn Funches, MD  digoxin (LANOXIN) 0.125 MG tablet Take 1 tablet (0.125 mg total) by mouth daily. 09/09/13  Yes Theodis Blaze, MD  ferrous sulfate 325 (65 FE) MG tablet Take 1 tablet (325 mg total) by mouth 2 (two) times daily with a meal. 05/03/16  Yes Josalyn Funches, MD  lisinopril (PRINIVIL,ZESTRIL) 2.5 MG tablet Take 1 tablet (2.5 mg total) by mouth daily. 09/09/13  Yes Theodis Blaze, MD  Na Sulfate-K Sulfate-Mg Sulf  17.5-3.13-1.6 GM/180ML SOLN Suprep-Use as directed 05/11/16  Yes Jessica D Zehr, PA-C  clopidogrel (PLAVIX) 75 MG tablet Take 75 mg by mouth daily.    Historical Provider, MD    Physical Exam: Vitals:   05/16/16 1255 05/16/16 1350 05/16/16 1355 05/16/16 1602  BP: 136/79 (!) 156/100 (!) 156/95 103/80  Pulse: 88 (!) 105 87 82  Resp: 15 (!) 24 18   Temp:    98.3 F (36.8 C)  TempSrc:    Oral  SpO2: 96% 100% 97% 98%  Weight:      Height:         General:  Appears calm and comfortable Eyes:  PERRL, EOMI, normal lids, iris ENT:  grossly normal hearing, lips & tongue, mmm Neck:  no LAD, masses or thyromegaly Cardiovascular:  RRR, 3/6 systolic murmur. trace LE edema.  Respiratory:  CTA  bilaterally, no w/r/r. Normal respiratory effort. Abdomen: Mild irritation to deep palpation of the middle lower and left lower regions. Abdominal fullness felt in the right upper and lower quadrants. Hypoactive bowel sounds throughout Skin:  no rash or induration seen on limited exam Musculoskeletal:  grossly normal tone BUE/BLE, good ROM, no bony abnormality Psychiatric:  grossly normal mood and affect, speech fluent and appropriate, AOx3 Neurologic:  CN 2-12 grossly intact, moves all extremities in coordinated fashion, sensation intact  Labs on Admission: I have personally reviewed following labs and imaging studies  CBC:  Recent Labs Lab 05/09/16 1700 05/16/16 1016  WBC 10.6*  --   NEUTROABS 8.8*  --   HGB 11.0* 12.6*  HCT 35.2* 37.0*  MCV 86.9  --   PLT 166  --    Basic Metabolic Panel:  Recent Labs Lab 05/09/16 1700 05/16/16 1016  NA 137 140  K 4.3 3.7  CL 105 101  CO2 23  --   GLUCOSE 128* 88  BUN 11 18  CREATININE 1.12 1.30*  CALCIUM 8.8*  --    GFR: Estimated Creatinine Clearance: 63.6 mL/min (A) (by C-G formula based on SCr of 1.3 mg/dL (H)). Liver Function Tests:  Recent Labs Lab 05/09/16 1700  AST 21  ALT 12*  ALKPHOS 56  BILITOT 0.6  PROT 5.8*  ALBUMIN 3.4*     No results for input(s): LIPASE, AMYLASE in the last 168 hours. No results for input(s): AMMONIA in the last 168 hours. Coagulation Profile: No results for input(s): INR, PROTIME in the last 168 hours. Cardiac Enzymes: No results for input(s): CKTOTAL, CKMB, CKMBINDEX, TROPONINI in the last 168 hours. BNP (last 3 results) No results for input(s): PROBNP in the last 8760 hours. HbA1C: No results for input(s): HGBA1C in the last 72 hours. CBG: No results for input(s): GLUCAP in the last 168 hours. Lipid Profile: No results for input(s): CHOL, HDL, LDLCALC, TRIG, CHOLHDL, LDLDIRECT in the last 72 hours. Thyroid Function Tests: No results for input(s): TSH, T4TOTAL, FREET4, T3FREE, THYROIDAB in the last 72 hours. Anemia Panel: No results for input(s): VITAMINB12, FOLATE, FERRITIN, TIBC, IRON, RETICCTPCT in the last 72 hours. Urine analysis: No results found for: COLORURINE, APPEARANCEUR, LABSPEC, PHURINE, GLUCOSEU, HGBUR, BILIRUBINUR, KETONESUR, PROTEINUR, UROBILINOGEN, NITRITE, LEUKOCYTESUR  Creatinine Clearance: Estimated Creatinine Clearance: 63.6 mL/min (A) (by C-G formula based on SCr of 1.3 mg/dL (H)).  Sepsis Labs: @LABRCNTIP (procalcitonin:4,lacticidven:4) )No results found for this or any previous visit (from the past 240 hour(s)).   Radiological Exams on Admission: No results found.  EKG:  pending  Assessment/Plan Active Problems:   Chronic systolic CHF (congestive heart failure) (HCC)   Hyperlipidemia   AICD (automatic cardioverter/defibrillator) present   Melena   Colonic mass   Partial bowel obstruction (HCC)   Essential hypertension   AKI (acute kidney injury) ([place])   Partial bowel obstruction: secondary to descending colonic mass. Concerning for malignancy. 68mo h/o Melena but no unintentional wt loss. Pt presented for admission from endoscopy lab after sigmoidoscopy. Previously pt had gone to ED after being unable to tolerate pain/n/v from bowel prep.  - CCS  consulted for likely partial colectomy - 05/18/16.  - f/u Path reports  - CT chest w/ contrast for staging in am - clear liquid diet - Consider NGT if unable to tolerate clears.  - KUB in am  MI/AICD/CHF/HTN: followed by Dr. Wynonia Lawman of cardiology. Last cath w/ stent placement 11/2015. EF 20% on last echo from 2015. Currently well compensated and w/o ACS  symptoms - Tele - EKG - Dr. Wynonia Lawman to see pt for cardiac clearance per CCS request - Hold Plavix in anticipation of surgery on 05-18-16 - Continue ASA, lipitor - Continue coreg and digoxin, lisinopril - Strict I/O, daily wts  AKI: Cr 1.3. Baseline 1.1. Likely from GI loss and poor oral intake  - IVF as above.  - BMET in am  Tobacco use:  - nicotine PRN    DVT prophylaxis: Heparin  Code Status: full  Family Communication: none  Disposition Plan: pending partial colectomy and development of care plan  Consults called: CCS, Cardiology - Dr. Wynonia Lawman  Admission status: inpt    Rachella Basden J MD [facility]  If 7PM-7AM, please contact night-coverage [email or web address] Password [facility]  05/16/2016, 4:28 PM

## 2016-05-16 NOTE — ED Triage Notes (Signed)
Patient arrived with EMS from home reports generalized abdominal pain/cramping with emesis onset this evening after drinking laxative , he is scheduled for surgery ( colostomy placement) at 9am this morning , denies diarrhea / no fever or chills .

## 2016-05-16 NOTE — Transfer of Care (Signed)
Immediate Anesthesia Transfer of Care Note  Patient: Christopher [name]  Procedure(s) Performed: Procedure(s): FLEXIBLE SIGMOIDOSCOPY (N/A)  Patient Location: PACU and Endoscopy Unit  Anesthesia Type:MAC  Level of Consciousness: awake, patient cooperative and responds to stimulation  Airway & Oxygen Therapy: Patient Spontanous Breathing  Post-op Assessment: Report given to RN and Post -op Vital signs reviewed and stable  Post vital signs: Reviewed and stable  Last Vitals:  Vitals:   05/16/16 1140  BP: 130/82  Pulse: 86  Resp: 20    Last Pain:  Vitals:   05/16/16 1140  TempSrc: Oral         Complications: No apparent anesthesia complications

## 2016-05-17 ENCOUNTER — Inpatient Hospital Stay (HOSPITAL_COMMUNITY): Payer: Medicare Other

## 2016-05-17 ENCOUNTER — Encounter (HOSPITAL_COMMUNITY): Payer: Self-pay | Admitting: Cardiology

## 2016-05-17 DIAGNOSIS — D649 Anemia, unspecified: Secondary | ICD-10-CM

## 2016-05-17 LAB — SURGICAL PCR SCREEN
MRSA, PCR: NEGATIVE
Staphylococcus aureus: NEGATIVE

## 2016-05-17 LAB — CBC
HCT: 31 % — ABNORMAL LOW (ref 39.0–52.0)
Hemoglobin: 10.2 g/dL — ABNORMAL LOW (ref 13.0–17.0)
MCH: 28.3 pg (ref 26.0–34.0)
MCHC: 32.9 g/dL (ref 30.0–36.0)
MCV: 86.1 fL (ref 78.0–100.0)
PLATELETS: 150 10*3/uL (ref 150–400)
RBC: 3.6 MIL/uL — AB (ref 4.22–5.81)
RDW: 18.2 % — ABNORMAL HIGH (ref 11.5–15.5)
WBC: 10.9 10*3/uL — ABNORMAL HIGH (ref 4.0–10.5)

## 2016-05-17 LAB — ECHOCARDIOGRAM COMPLETE
Height: 68 in
WEIGHTICAEL: 2522.06 [oz_av]

## 2016-05-17 LAB — BASIC METABOLIC PANEL
ANION GAP: 13 (ref 5–15)
BUN: 18 mg/dL (ref 6–20)
CO2: 23 mmol/L (ref 22–32)
Calcium: 8.6 mg/dL — ABNORMAL LOW (ref 8.9–10.3)
Chloride: 103 mmol/L (ref 101–111)
Creatinine, Ser: 1.23 mg/dL (ref 0.61–1.24)
GFR calc Af Amer: >60 mL/min (ref >60)
Glucose, Bld: 108 mg/dL — ABNORMAL HIGH (ref 65–99)
POTASSIUM: 3.4 mmol/L — AB (ref 3.5–5.1)
SODIUM: 139 mmol/L (ref 135–145)

## 2016-05-17 MED ORDER — POTASSIUM CHLORIDE CRYS ER 20 MEQ PO TBCR
40.0000 meq | EXTENDED_RELEASE_TABLET | Freq: Once | ORAL | Status: AC
  Administered 2016-05-17: 40 meq via ORAL
  Filled 2016-05-17: qty 2

## 2016-05-17 MED ORDER — PROMETHAZINE HCL 25 MG/ML IJ SOLN
12.5000 mg | Freq: Once | INTRAMUSCULAR | Status: AC
  Administered 2016-05-17: 12.5 mg via INTRAVENOUS
  Filled 2016-05-17: qty 1

## 2016-05-17 MED ORDER — IOPAMIDOL (ISOVUE-300) INJECTION 61%
INTRAVENOUS | Status: AC
  Administered 2016-05-17: 50 mL
  Filled 2016-05-17: qty 75

## 2016-05-17 NOTE — Progress Notes (Signed)
I have made Christopher [name] aware of his biopsy results and cancer diagnosis.  He is scheduled for the OR tomorrow.  We will keep track of him as an outpatient in order to plan a colonoscopy through his eventual colostomy after finishing his cancer treatments and before colostomy takedown.  Signing off - call as needed.

## 2016-05-17 NOTE — Progress Notes (Signed)
[facility] Progress Note  1 Day Post-Op  Subjective: CC: abdominal pain  Pt with continued intermittent abdominal pain he describes as gas and nausea. He has had a small amt of flatus and small liquid BM. No fever or chills.   Objective: Vital signs in last 24 hours: Temp:  [97.9 F (36.6 C)-98.3 F (36.8 C)] 98.1 F (36.7 C) (04/19 0941) Pulse Rate:  [76-105] 80 (04/19 0941) Resp:  [15-24] 17 (04/19 0941) BP: (103-156)/(70-100) 104/70 (04/19 0941) SpO2:  [96 %-100 %] 96 % (04/19 0447) Weight:  [157 lb 10.1 oz (71.5 kg)-158 lb (71.7 kg)] 157 lb 10.1 oz (71.5 kg) (04/19 0500) Last BM Date: 05/16/16  Intake/Output from previous day: 04/18 0701 - 04/19 0700 In: 921.7 [I.V.:921.7] Out: 200 [Urine:200] Intake/Output this shift: Total I/O In: -  Out: 100 [Urine:100]  PE: Gen:  Alert, NAD, pleasant, cooperative, well appearing Card:  RRR, no M/G/R heard Pulm:  Rate and effort normal Abd: Soft, mildly distended, hyperactive BS, generalized mild TTP with worse TTP in LUQ Skin: no rashes noted, warm and dry  Lab Results:   Recent Labs  05/16/16 1016 05/17/16 0350  WBC  --  10.9*  HGB 12.6* 10.2*  HCT 37.0* 31.0*  PLT  --  150   BMET  Recent Labs  05/16/16 1016 05/17/16 0350  NA 140 139  K 3.7 3.4*  CL 101 103  CO2  --  23  GLUCOSE 88 108*  BUN 18 18  CREATININE 1.30* 1.23  CALCIUM  --  8.6*   PT/INR No results for input(s): LABPROT, INR in the last 72 hours. CMP     Component Value Date/Time   NA 139 05/17/2016 0350   K 3.4 (L) 05/17/2016 0350   CL 103 05/17/2016 0350   CO2 23 05/17/2016 0350   GLUCOSE 108 (H) 05/17/2016 0350   BUN 18 05/17/2016 0350   CREATININE 1.23 05/17/2016 0350   CREATININE 1.13 02/09/2015 0942   CALCIUM 8.6 (L) 05/17/2016 0350   PROT 5.8 (L) 05/09/2016 1700   ALBUMIN 3.4 (L) 05/09/2016 1700   AST 21 05/09/2016 1700   ALT 12 (L) 05/09/2016 1700   ALKPHOS 56 05/09/2016 1700   BILITOT 0.6 05/09/2016 1700   GFRNONAA >60 05/17/2016 0350   GFRAA >60 05/17/2016 0350   Lipase  No results found for: LIPASE     Studies/Results: Dg Abd 1 View  Result Date: 05/17/2016 CLINICAL DATA:  History of colonic malignancy, bowel obstruction. EXAM: ABDOMEN - 1 VIEW COMPARISON:  Abdominal and pelvic CT scan of May 09, 2016 FINDINGS: There is moderate gaseous distention of the transverse and proximal descending colon. There is a moderate amount of gas within the cecum. The rectosigmoid exhibits only a small amount of gas. No small bowel obstructive pattern is observed. No free extraluminal gas collections are demonstrated. IMPRESSION: Persistent moderate gaseous distention of the colon with transition zone in the mid descending colon. No high-grade obstruction. No evidence of perforation. Electronically Signed   By: David  Martinique M.D.   On: 05/17/2016 07:51    Anti-infectives: Anti-infectives    None       Assessment/Plan  Chronic systolic CHF (congestive heart failure) (HCC)   Hyperlipidemia   AICD (automatic cardioverter/defibrillator) present   Melena   Colonic mass   Partial bowel obstruction (HCC)   Essential hypertension   AKI (acute kidney injury) ([place])  Obstructing descending colon mass likely malignant - Cardiac clearance, per Dr. Wynonia Lawman May proceed with surgery  from a cardiovascular point. Appreciate your recommendations - OR tomorrow morning  - NPO  - hold off on NGT unless pt is actively vomiting   LOS: 1 day    Kalman Drape , [facility] 05/17/2016, 10:32 AM Pager: [phone] Consults: [phone] Mon-Fri 7:00 am-4:30 pm Sat-Sun 7:00 am-11:30 am

## 2016-05-17 NOTE — Progress Notes (Signed)
[facility] PROGRESS NOTE  Christopher [name] WIO:[id number] DOB: Mar 22, 1960 DOA: 05/16/2016  PCP: Minerva Ends, MD  Brief History/Interval Summary: 56 year old Caucasian male with medical history significant of CAD/MI status post cardiac catheterization with stent placement and AICD, CHF, C KD, COPD, hyperlipidemia, tobacco use , presented with complaints of abdominal pain and distention. He was scheduled to undergo colonoscopy. He underwent this and was found to have an obstructing mass in the distal descending colon. [name] was hospitalized for further management.  Reason for Visit: Obstructing mass in the distal descending colon  Consultants: Gastroenterology. General surgery. Cardiology.  Procedures: Transthoracic echocardiogram has been ordered  Antibiotics: None  Subjective/Interval History: [name] feels less distended today. Still have some discomfort in the abdomen but denies any pain per se. Denies any nausea, vomiting. No chest pain or shortness of breath.  ROS: Denies any headaches.  Objective:  Vital Signs  Vitals:   05/16/16 2050 05/17/16 0447 05/17/16 0500 05/17/16 0941  BP: 123/71 104/73  104/70  Pulse: 93 85  80  Resp: 16 16  17   Temp: 98.3 F (36.8 C) 97.9 F (36.6 C)  98.1 F (36.7 C)  TempSrc: Oral Oral  Oral  SpO2: 100% 96%    Weight:   71.5 kg (157 lb 10.1 oz)   Height:        Intake/Output Summary (Last 24 hours) at 05/17/16 1158 Last data filed at 05/17/16 3845  Gross per 24 hour  Intake           921.67 ml  Output              300 ml  Net           621.67 ml   Filed Weights   05/16/16 1140 05/17/16 0500  Weight: 71.7 kg (158 lb) 71.5 kg (157 lb 10.1 oz)    General appearance: alert, cooperative, appears stated age and no distress Resp: clear to auscultation bilaterally Cardio: regular rate and rhythm, S1, S2 normal, no murmur, click, rub or gallop GI: Abdomen is soft. Nontender. Bowel sounds are sluggish but present.  No masses or organomegaly. Extremities: extremities normal, atraumatic, no cyanosis or edema Neurologic: No focal neurological deficits  Lab Results:  Data Reviewed: I have personally reviewed following labs and imaging studies  CBC:  Recent Labs Lab 05/16/16 1016 05/17/16 0350  WBC  --  10.9*  HGB 12.6* 10.2*  HCT 37.0* 31.0*  MCV  --  86.1  PLT  --  364    Basic Metabolic Panel:  Recent Labs Lab 05/16/16 1016 05/17/16 0350  NA 140 139  K 3.7 3.4*  CL 101 103  CO2  --  23  GLUCOSE 88 108*  BUN 18 18  CREATININE 1.30* 1.23  CALCIUM  --  8.6*    GFR: Estimated Creatinine Clearance: 67.2 mL/min (by C-G formula based on SCr of 1.23 mg/dL).   Radiology Studies: Dg Abd 1 View  Result Date: 05/17/2016 CLINICAL DATA:  History of colonic malignancy, bowel obstruction. EXAM: ABDOMEN - 1 VIEW COMPARISON:  Abdominal and pelvic CT scan of May 09, 2016 FINDINGS: There is moderate gaseous distention of the transverse and proximal descending colon. There is a moderate amount of gas within the cecum. The rectosigmoid exhibits only a small amount of gas. No small bowel obstructive pattern is observed. No free extraluminal gas collections are demonstrated. IMPRESSION: Persistent moderate gaseous distention of the colon with transition zone in the mid descending colon. No high-grade obstruction.  No evidence of perforation. Electronically Signed   By: David  Martinique M.D.   On: 05/17/2016 07:51   Ct Chest W Contrast  Result Date: 05/17/2016 CLINICAL DATA:  Mass lesion identified in the descending colon on CT abdomen and pelvis 05/09/2016. Staging examination. EXAM: CT CHEST WITH CONTRAST TECHNIQUE: Multidetector CT imaging of the chest was performed during intravenous contrast administration. CONTRAST:  100 ml ISOVUE-300 IOPAMIDOL (ISOVUE-300) INJECTION 61% COMPARISON:  PA and lateral chest 12/27/2014. CT abdomen and pelvis 05/09/2016. FINDINGS: Cardiovascular: There is mild cardiomegaly.  No pericardial effusion. Calcific aortic and coronary atherosclerosis is identified. Stent in the LAD is noted. Mediastinum/Nodes: No enlarged mediastinal, hilar, or axillary lymph nodes. Thyroid gland, trachea, and esophagus demonstrate no significant findings. Lungs/Pleura: No pleural effusion. The pulmonary nodule, mass or consolidative process. Minimal dependent atelectasis is seen. Centrilobular emphysematous disease is identified. Upper Abdomen: There is marked distention of the transverse colon which is worse than on the prior exam. Scarring upper pole left kidney is noted. Cyst in the upper pole of the right kidney is identified. Musculoskeletal: No acute abnormality. No lytic or sclerotic lesion. IMPRESSION: Negative for metastatic or acute abnormality in the chest. Cardiomegaly. Calcific aortic and coronary atherosclerosis. Coronary artery stent is noted. Centrilobular emphysema. Worsened gaseous distention of the transverse colon since the comparison CT abdomen and pelvis. Electronically Signed   By: Inge Rise M.D.   On: 05/17/2016 10:55     Medications:  Scheduled: . aspirin  81 mg Oral Daily  . atorvastatin  80 mg Oral q1800  . carvedilol  6.25 mg Oral BID WC  . digoxin  0.125 mg Oral Daily  . heparin  5,000 Units Subcutaneous Q8H  . lisinopril  2.5 mg Oral Daily   Continuous: . dextrose 5 % and 0.45% NaCl 50 mL/hr at 05/17/16 1132   JAS:[id number] **OR** acetaminophen, fentaNYL (SUBLIMAZE) injection, nicotine polacrilex, ondansetron **OR** ondansetron (ZOFRAN) IV  Assessment/Plan:  Principal Problem:   Partial bowel obstruction (HCC) Active Problems:   Chronic systolic CHF (congestive heart failure) (HCC)   Hyperlipidemia   AICD (automatic cardioverter/defibrillator) present   Anemia due to blood loss   Melena   Colonic mass   Essential hypertension   AKI (acute kidney injury) ([place])    Obstructing mass in the distal descending colon causing Partial bowel  obstruction This is concerning for malignancy. Biopsies have been taken. Patient seen by general surgery. Plan is for surgical intervention tomorrow. CT chest did not show any metastatic lesions.  History of coronary artery disease/chronic systolic CHF, status post AICD Followed by Dr. Wynonia Lawman of cardiology. Last cath w/ stent placement 11/2015. EF 20% on last echo from 2015. Currently well compensated and w/o ACS symptoms. Seen by Dr. Wynonia Lawman this morning. Plavix has been discontinued. Echocardiogram has been ordered. His defibrillator will need to be placed on standby during surgery reactivated afterwards. Continue ASA, lipitor. Continue coreg and digoxin, lisinopril.   Mild AKI Creatinine is normal this morning. Continue to monitor urine output.  Normocytic anemia Hemoglobin is close to baseline. No evidence for overt bleeding, although he may have had black stools recently.  Tobacco use Nicotine PRN  DVT Prophylaxis: Currently on subcutaneous heparin. Due to episodes of dark tarry stools, we will change it to SCDs. Code Status: Full code  Family Communication: Discussed with patient  Disposition Plan: Management as discussed above    LOS: 1 day   [facility] Pager [phone] 05/17/2016, 11:58 AM  If 7PM-7AM, please contact night-coverage at  [email or web address], password [facility]

## 2016-05-17 NOTE — Consult Note (Addendum)
Cardiology Consult Note  Admit date: 05/16/2016 Name: Christopher [name] 56 y.o.  male DOB:  09-20-1960 MRN:  [id number]  Today's date:  05/17/2016  Referring Physician:    [facility]  Primary Physician:    Dr. [name]  Reason for Consultation:   Preoperative cardiovascular evaluation  IMPRESSIONS: 1.  Coronary artery disease with previous anterior infarction with stenting of the LAD and previous stent of the circumflex later for a non-STEMI currently stable 2.  Ischemic cardiomyopathy class II 3.  Functioning implantable defibrillator 4.  Colonic mass causing bowel obstruction 5.  History of chronic thrombocytopenia 6.  COPD 7.  Hyperlipidemia 8.  Functioning implantable defibrillator.  RECOMMENDATION: May proceed with surgery from a cardiovascular point.  His coronary artery disease has been stable and he does not currently have angina or decompensated congestive heart failure.  A.  Discontinue Plavix prior to surgery to minimize bleeding B.  Obtain echocardiogram to assess residual LV function although he is stable with no evidence of decompensation prior to surgery C.  He has an implantable defibrillator and this will need to be placed on standby during the surgery and reactivated afterwards D.  With his LV function will need to be careful with fluid management following surgery E.  Telemetry monitoring following surgery  HISTORY: This 56 year old male is well-known to me.  He had a previous late presentation of a myocardial infarction and had stenting of the LAD in 2015.  He later presented with a non-STEMI and had stenting of a circumflex.  At the time of his catheterization in 2016 he had a patent stent to the LAD.  He has residual LV dysfunction following optimal medical therapy back in 2015.  Because of residual LV dysfunction he subsequently had an implantable defibrillator  but has not had any shocks from the defibrillator.  He is single and has had a lot of  psychosocial stress.  When I saw him in December he was having abdominal pain and he was advised to get his GI evaluation at that time.  He was seen recently with some unusual sensations around his defibrillator which turned out to check out fine and he was found to be anemic during that evaluation and went back and saw his physician and a workup was begun.  He had a CT scan and became pale and diaphoretic after receiving contrast and was seen in the emergency room.  He later saw the gastroenterologist and was unable to tolerate the bowel prep and was seen in the emergency room.  He was found to have an obstructing mass in his colon.  This morning he complains of feeling poorly.  He is passing a little bit of gas and complains of abdominal pain and waves of cramping.  He does not have any chest pain suggestive of angina and denies PND or orthopnea or edema.  He does not have any significant dyspnea with exertion normally.  Past Medical History:  Diagnosis Date  . Automatic implantable cardioverter-defibrillator in situ    MDT Aug 2015 Dr. Lovena Le  . CAD (coronary artery disease)    PCI to LAD with DES May 2015  . CAD (coronary artery disease), native coronary artery 07/03/2013   Cath 06/20/13  Normal left main, occluded LAD, occluded RCA, 50% circ EF 15%  3.0 x28 and 3.0 x 8 mm Xience stent Dr. Tamala Julian  To LAD   . CHF (congestive heart failure) ([place]) 05/2013   . Chronic kidney disease   . Chronic systolic CHF (  congestive heart failure) ([place])    ECHO 08/05/13  EF 30%  Anterior akinesis and inferior hypokinesis   . COPD (chronic obstructive pulmonary disease) ([place])   . Heavy smoker   . Hyperlipidemia   . Mass of colon 04/2016  . Myocardial infarction ([place]) 04/29/2013  . Thrombocytopenia ([place]) 12/27/2014   Chronic       Past Surgical History:  Procedure Laterality Date  . CARDIAC CATHETERIZATION  05/2013   . CARDIAC CATHETERIZATION N/A 12/28/2014   Procedure: Left Heart Cath and Coronary Angiography;   Surgeon: [name], MD;  Location: [place] CV LAB;  Service: Cardiovascular;  Laterality: N/A;  . CARDIAC CATHETERIZATION N/A 12/28/2014   Procedure: Coronary Stent Intervention;  Surgeon: [name], MD;  Location: [place] CV LAB;  Service: Cardiovascular;  Laterality: N/A;  . CORONARY ANGIOPLASTY  05/2013   . FLEXIBLE SIGMOIDOSCOPY N/A 05/16/2016   Procedure: FLEXIBLE SIGMOIDOSCOPY;  Surgeon: [name], MD;  Location: [place];  Service: Gastroenterology;  Laterality: N/A;  . ICD placement  09/07/2013   . IMPLANTABLE CARDIOVERTER DEFIBRILLATOR IMPLANT N/A 09/07/2013   Procedure: IMPLANTABLE CARDIOVERTER DEFIBRILLATOR IMPLANT;  Surgeon: [name], MD;  Location[facility] CATH LAB;  Service: Cardiovascular;  Laterality: N/A;  . INTRA-AORTIC BALLOON PUMP INSERTION  06/20/2013   Procedure: INTRA-AORTIC BALLOON PUMP INSERTION;  Surgeon: Sinclair Grooms, MD;  Location: [facility] CATH LAB;  Service: Cardiovascular;;  . LEFT HEART CATHETERIZATION WITH CORONARY ANGIOGRAM N/A 06/20/2013   Procedure: LEFT HEART CATHETERIZATION WITH CORONARY ANGIOGRAM;  Surgeon: Sinclair Grooms, MD;  Location: [facility] CATH LAB;  Service: Cardiovascular;  Laterality: N/A;  . PERCUTANEOUS CORONARY STENT INTERVENTION (PCI-S)  06/20/2013   Procedure: PERCUTANEOUS CORONARY STENT INTERVENTION (PCI-S);  Surgeon: Sinclair Grooms, MD;  Location[facility] C[facility] LAB;  Service: Cardiovascular;;    Allergies:  is allergic to celebrex [celecoxib] and contrast media [iodinated diagnostic agents].   Medications: Prior to Admission medications   Medication Sig Start Date End Date Taking? Authorizing Provider  aspirin 81 MG chewable tablet Chew 1 tablet (81 mg total) by mouth daily. 09/09/13  Yes Theodis Blaze, MD  atorvastatin (LIPITOR) 80 MG tablet Take 1 tablet (80 mg total) by mouth daily at 6 PM. 09/09/13  Yes Theodis Blaze, MD  carvedilol (COREG) 6.25 MG tablet Take 1 tablet (6.25 mg total) by mouth 2 (two) times daily with  a meal. 01/17/15  Yes Josalyn Funches, MD  digoxin (LANOXIN) 0.125 MG tablet Take 1 tablet (0.125 mg total) by mouth daily. 09/09/13  Yes Theodis Blaze, MD  ferrous sulfate 325 (65 FE) MG tablet Take 1 tablet (325 mg total) by mouth 2 (two) times daily with a meal. 05/03/16  Yes Josalyn Funches, MD  lisinopril (PRINIVIL,ZESTRIL) 2.5 MG tablet Take 1 tablet (2.5 mg total) by mouth daily. 09/09/13  Yes Theodis Blaze, MD  Na Sulfate-K Sulfate-Mg Sulf 17.5-3.13-1.6 GM/180ML SOLN Suprep-Use as directed 05/11/16  Yes Jessica D Zehr, PA-C  clopidogrel (PLAVIX) 75 MG tablet Take 75 mg by mouth daily.    Historical Provider, MD   Family History: Family Status  Relation Status  . Father Deceased at age 65's  . Mother Deceased at age 108's  . Brother Alive  . Brother Alive  . Sister Alive  . Brother Alive  . Maternal Grandmother Deceased  . Paternal Grandmother Alive  . Neg Hx    Social History:   reports that he quit smoking about 16 months  ago. His smoking use included Cigarettes. He has a 45.00 pack-year smoking history. He has never used smokeless tobacco. He reports that he does not drink alcohol or use drugs.   Social History   Social History Narrative   Lives alone.        Review of Systems: He has had abdominal pain going back several months.  He has some mild chronic back pain.  He has a great deal of financial issues and psychosocial stress with anxiety.  He has significant insomnia.  Other than as noted above the remainder of the review of systems is unremarkable.  Physical Exam: BP 104/73 (BP Location: Left Arm)   Pulse 85   Temp 97.9 F (36.6 C) (Oral)   Resp 16   Ht 5\' 8"  (1.727 m)   Wt 71.5 kg (157 lb 10.1 oz)   SpO2 96%   BMI 23.97 kg/m   General appearance: Pleasant mildly anxious thin framed white male in no acute distress Head: Normocephalic, without obvious abnormality, atraumatic, Balding male hair pattern Eyes: conjunctivae/corneas clear. PERRL, EOM's intact.  Fundi not examined  Neck: no adenopathy, no carotid bruit, no JVD and supple, symmetrical, trachea midline Lungs: clear to auscultation bilaterally defibrillator present in the left subclavicular area Heart: regular rate and rhythm, S1, S2 normal, no murmur, click, rub or gallop Abdomen: Distended and somewhat tympanitic, very minimal tenderness bowel sounds somewhat reduced Rectal: deferred Extremities: extremities normal, atraumatic, no cyanosis or edema Pulses: 2+ and symmetric Skin: Skin color, texture, turgor normal. No rashes or lesions Neurologic: Grossly normal Psych: Alert and oriented x 3 Labs: CBC  Recent Labs  05/17/16 0350  WBC 10.9*  RBC 3.60*  HGB 10.2*  HCT 31.0*  PLT 150  MCV 86.1  MCH 28.3  MCHC 32.9  RDW 18.2*   CMP   Recent Labs  05/17/16 0350  NA 139  K 3.4*  CL 103  CO2 23  GLUCOSE 108*  BUN 18  CREATININE 1.23  CALCIUM 8.6*  GFRNONAA >60  GFRAA >60   Radiology:  No chest x-ray available for review  EKG: EKG from the 11th shows sinus rhythm, old anterolateral infarction and inferior infarction, LVH, nonspecific changes laterally Independently reviewed by me   Signed:  W. Doristine Church MD [facility]   Cardiology Consultant  05/17/2016, 8:35 AM

## 2016-05-18 ENCOUNTER — Inpatient Hospital Stay (HOSPITAL_COMMUNITY): Payer: Medicare Other | Admitting: Anesthesiology

## 2016-05-18 ENCOUNTER — Encounter (HOSPITAL_COMMUNITY): Payer: Self-pay | Admitting: Surgery

## 2016-05-18 ENCOUNTER — Encounter (HOSPITAL_COMMUNITY)
Admission: RE | Disposition: A | Discharge status: Home / Self Care | Payer: Self-pay | Source: Ambulatory Visit | Attending: Family Medicine

## 2016-05-18 DIAGNOSIS — D500 Iron deficiency anemia secondary to blood loss (chronic): Secondary | ICD-10-CM

## 2016-05-18 HISTORY — PX: COLECTOMY WITH COLOSTOMY CREATION/HARTMANN PROCEDURE: SHX6598

## 2016-05-18 LAB — CBC
HEMATOCRIT: 31.7 % — AB (ref 39.0–52.0)
Hemoglobin: 10 g/dL — ABNORMAL LOW (ref 13.0–17.0)
MCH: 27.1 pg (ref 26.0–34.0)
MCHC: 31.5 g/dL (ref 30.0–36.0)
MCV: 85.9 fL (ref 78.0–100.0)
Platelets: 137 10*3/uL — ABNORMAL LOW (ref 150–400)
RBC: 3.69 MIL/uL — ABNORMAL LOW (ref 4.22–5.81)
RDW: 17.9 % — AB (ref 11.5–15.5)
WBC: 3.9 10*3/uL — AB (ref 4.0–10.5)

## 2016-05-18 LAB — DIGOXIN LEVEL: Digoxin Level: <0.2 ng/mL — ABNORMAL LOW (ref 0.8–2.0)

## 2016-05-18 LAB — BASIC METABOLIC PANEL
ANION GAP: 11 (ref 5–15)
BUN: 20 mg/dL (ref 6–20)
CALCIUM: 8.7 mg/dL — AB (ref 8.9–10.3)
CO2: 26 mmol/L (ref 22–32)
Chloride: 103 mmol/L (ref 101–111)
Creatinine, Ser: 1.25 mg/dL — ABNORMAL HIGH (ref 0.61–1.24)
GFR calc Af Amer: >60 mL/min (ref >60)
GFR calc non Af Amer: >60 mL/min (ref >60)
GLUCOSE: 126 mg/dL — AB (ref 65–99)
POTASSIUM: 3.3 mmol/L — AB (ref 3.5–5.1)
Sodium: 140 mmol/L (ref 135–145)

## 2016-05-18 LAB — PREPARE RBC (CROSSMATCH)

## 2016-05-18 SURGERY — COLECTOMY, WITH COLOSTOMY CREATION
Anesthesia: General | Site: Abdomen | Laterality: Left

## 2016-05-18 MED ORDER — LIDOCAINE 2% (20 MG/ML) 5 ML SYRINGE
INTRAMUSCULAR | Status: AC
  Filled 2016-05-18: qty 5

## 2016-05-18 MED ORDER — LACTATED RINGERS IV SOLN
INTRAVENOUS | Status: DC | PRN
  Administered 2016-05-18: 10:00:00 via INTRAVENOUS

## 2016-05-18 MED ORDER — ROCURONIUM BROMIDE 10 MG/ML (PF) SYRINGE
PREFILLED_SYRINGE | INTRAVENOUS | Status: AC
  Filled 2016-05-18: qty 5

## 2016-05-18 MED ORDER — LACTATED RINGERS IV SOLN
INTRAVENOUS | Status: DC
  Administered 2016-05-18: 09:00:00 via INTRAVENOUS

## 2016-05-18 MED ORDER — SODIUM CHLORIDE 0.9% FLUSH: 9.0000 mL | INTRAVENOUS | Status: DC | PRN

## 2016-05-18 MED ORDER — ALVIMOPAN 12 MG PO CAPS
ORAL_CAPSULE | ORAL | Status: AC
  Administered 2016-05-18: 12 mg via ORAL
  Filled 2016-05-18: qty 1

## 2016-05-18 MED ORDER — MIDAZOLAM HCL 2 MG/2ML IJ SOLN
INTRAMUSCULAR | Status: AC
  Filled 2016-05-18: qty 2

## 2016-05-18 MED ORDER — ARTIFICIAL TEARS OP OINT
TOPICAL_OINTMENT | OPHTHALMIC | Status: DC | PRN
  Administered 2016-05-18: 1 via OPHTHALMIC

## 2016-05-18 MED ORDER — ONDANSETRON HCL 4 MG/2ML IJ SOLN
INTRAMUSCULAR | Status: DC | PRN
  Administered 2016-05-18: 4 mg via INTRAVENOUS

## 2016-05-18 MED ORDER — OXYCODONE HCL 5 MG PO TABS: 5.0000 mg | ORAL_TABLET | Freq: Once | ORAL | Status: DC | PRN

## 2016-05-18 MED ORDER — 0.9 % SODIUM CHLORIDE (POUR BTL) OPTIME
TOPICAL | Status: DC | PRN
  Administered 2016-05-18 (×2): 2000 mL

## 2016-05-18 MED ORDER — ALVIMOPAN 12 MG PO CAPS
12.0000 mg | ORAL_CAPSULE | Freq: Once | ORAL | Status: AC
  Administered 2016-05-18: 12 mg via ORAL

## 2016-05-18 MED ORDER — MEPERIDINE HCL 25 MG/ML IJ SOLN: 6.2500 mg | INTRAMUSCULAR | Status: DC | PRN

## 2016-05-18 MED ORDER — DIPHENHYDRAMINE HCL 12.5 MG/5ML PO ELIX: 12.5000 mg | ORAL_SOLUTION | Freq: Four times a day (QID) | ORAL | Status: DC | PRN

## 2016-05-18 MED ORDER — FENTANYL CITRATE (PF) 100 MCG/2ML IJ SOLN
INTRAMUSCULAR | Status: DC | PRN
  Administered 2016-05-18 (×3): 50 ug via INTRAVENOUS
  Administered 2016-05-18: 100 ug via INTRAVENOUS

## 2016-05-18 MED ORDER — PROPOFOL 10 MG/ML IV BOLUS
INTRAVENOUS | Status: AC
  Filled 2016-05-18: qty 20

## 2016-05-18 MED ORDER — ONDANSETRON HCL 4 MG/2ML IJ SOLN
INTRAMUSCULAR | Status: AC
  Filled 2016-05-18: qty 2

## 2016-05-18 MED ORDER — LIDOCAINE HCL (CARDIAC) 20 MG/ML IV SOLN
INTRAVENOUS | Status: DC | PRN
  Administered 2016-05-18: 60 mg via INTRAVENOUS

## 2016-05-18 MED ORDER — CEFOTETAN DISODIUM-DEXTROSE 2-2.08 GM-% IV SOLR
INTRAVENOUS | Status: AC
  Filled 2016-05-18: qty 50

## 2016-05-18 MED ORDER — DIPHENHYDRAMINE HCL 50 MG/ML IJ SOLN: 12.5000 mg | Freq: Four times a day (QID) | INTRAMUSCULAR | Status: DC | PRN

## 2016-05-18 MED ORDER — HYDROMORPHONE HCL 1 MG/ML IJ SOLN
INTRAMUSCULAR | Status: AC
  Administered 2016-05-18: 0.5 mg via INTRAVENOUS
  Filled 2016-05-18: qty 0.5

## 2016-05-18 MED ORDER — SUGAMMADEX SODIUM 200 MG/2ML IV SOLN
INTRAVENOUS | Status: AC
  Filled 2016-05-18: qty 2

## 2016-05-18 MED ORDER — SUGAMMADEX SODIUM 200 MG/2ML IV SOLN
INTRAVENOUS | Status: DC | PRN
  Administered 2016-05-18: 146.2 mg via INTRAVENOUS

## 2016-05-18 MED ORDER — DEXTROSE 5 % IV SOLN
2.0000 g | INTRAVENOUS | Status: AC
  Administered 2016-05-18: 2 g via INTRAVENOUS
  Filled 2016-05-18: qty 2

## 2016-05-18 MED ORDER — NALOXONE HCL 0.4 MG/ML IJ SOLN: 0.4000 mg | INTRAMUSCULAR | Status: DC | PRN

## 2016-05-18 MED ORDER — ETOMIDATE 2 MG/ML IV SOLN
INTRAVENOUS | Status: DC | PRN
  Administered 2016-05-18: 14 mg via INTRAVENOUS

## 2016-05-18 MED ORDER — MORPHINE SULFATE 2 MG/ML IV SOLN
INTRAVENOUS | Status: AC
  Filled 2016-05-18: qty 30

## 2016-05-18 MED ORDER — MORPHINE SULFATE 2 MG/ML IV SOLN
INTRAVENOUS | Status: DC
  Administered 2016-05-18: 25.5 mg via INTRAVENOUS
  Administered 2016-05-18: 13:00:00 via INTRAVENOUS
  Administered 2016-05-18: 7.5 mg via INTRAVENOUS
  Administered 2016-05-19: 12 mg via INTRAVENOUS
  Administered 2016-05-19: 23.4 mg via INTRAVENOUS
  Administered 2016-05-19: 28.5 mg via INTRAVENOUS
  Administered 2016-05-19: 01:00:00 via INTRAVENOUS
  Administered 2016-05-19: 6 mg via INTRAVENOUS
  Administered 2016-05-19: 30 mg via INTRAVENOUS
  Administered 2016-05-20: 16.5 mg via INTRAVENOUS
  Administered 2016-05-20: 28.75 mg via INTRAVENOUS
  Administered 2016-05-20: 16.8 mg via INTRAVENOUS
  Administered 2016-05-20: 14:00:00 via INTRAVENOUS
  Administered 2016-05-20: 14.57 mg via INTRAVENOUS
  Administered 2016-05-20 – 2016-05-21 (×2): via INTRAVENOUS
  Administered 2016-05-21: 24 mg via INTRAVENOUS
  Administered 2016-05-21: 21 mg via INTRAVENOUS
  Filled 2016-05-18 (×5): qty 30

## 2016-05-18 MED ORDER — ALBUMIN HUMAN 5 % IV SOLN
INTRAVENOUS | Status: DC | PRN
  Administered 2016-05-18: 10:00:00 via INTRAVENOUS

## 2016-05-18 MED ORDER — ALBUMIN HUMAN 5 % IV SOLN
12.5000 g | Freq: Once | INTRAVENOUS | Status: DC
  Filled 2016-05-18: qty 250

## 2016-05-18 MED ORDER — ARTIFICIAL TEARS OP OINT
TOPICAL_OINTMENT | OPHTHALMIC | Status: AC
  Filled 2016-05-18: qty 3.5

## 2016-05-18 MED ORDER — HYDROMORPHONE HCL 1 MG/ML IJ SOLN
0.2500 mg | INTRAMUSCULAR | Status: DC | PRN
  Administered 2016-05-18: 0.5 mg via INTRAVENOUS

## 2016-05-18 MED ORDER — OXYCODONE HCL 5 MG/5ML PO SOLN: 5.0000 mg | Freq: Once | ORAL | Status: DC | PRN

## 2016-05-18 MED ORDER — PHENYLEPHRINE HCL 10 MG/ML IJ SOLN
INTRAVENOUS | Status: DC | PRN
  Administered 2016-05-18: 20 ug/min via INTRAVENOUS

## 2016-05-18 MED ORDER — FENTANYL CITRATE (PF) 250 MCG/5ML IJ SOLN
INTRAMUSCULAR | Status: AC
  Filled 2016-05-18: qty 5

## 2016-05-18 MED ORDER — ONDANSETRON HCL 4 MG/2ML IJ SOLN
4.0000 mg | Freq: Four times a day (QID) | INTRAMUSCULAR | Status: DC | PRN
  Administered 2016-05-19: 4 mg via INTRAVENOUS
  Filled 2016-05-18: qty 2

## 2016-05-18 MED ORDER — ROCURONIUM BROMIDE 100 MG/10ML IV SOLN
INTRAVENOUS | Status: DC | PRN
  Administered 2016-05-18: 50 mg via INTRAVENOUS
  Administered 2016-05-18: 10 mg via INTRAVENOUS

## 2016-05-18 MED ORDER — PROMETHAZINE HCL 25 MG/ML IJ SOLN: 6.2500 mg | INTRAMUSCULAR | Status: DC | PRN

## 2016-05-18 SURGICAL SUPPLY — 47 items
BLADE CLIPPER SURG (BLADE) IMPLANT
CANISTER SUCT 3000ML PPV (MISCELLANEOUS) ×2 IMPLANT
CHLORAPREP W/TINT 26ML (MISCELLANEOUS) ×2 IMPLANT
COVER SURGICAL LIGHT HANDLE (MISCELLANEOUS) ×2 IMPLANT
DRAPE LAPAROSCOPIC ABDOMINAL (DRAPES) ×2 IMPLANT
DRAPE WARM FLUID 44X44 (DRAPE) ×2 IMPLANT
DRSG OPSITE POSTOP 4X10 (GAUZE/BANDAGES/DRESSINGS) ×1 IMPLANT
DRSG OPSITE POSTOP 4X8 (GAUZE/BANDAGES/DRESSINGS) ×1 IMPLANT
ELECT BLADE 6.5 EXT (BLADE) ×2 IMPLANT
ELECT CAUTERY BLADE 6.4 (BLADE) ×4 IMPLANT
ELECT REM PT RETURN 9FT ADLT (ELECTROSURGICAL) ×2
ELECTRODE REM PT RTRN 9FT ADLT (ELECTROSURGICAL) ×1 IMPLANT
GLOVE BIO SURGEON STRL SZ8 (GLOVE) ×4 IMPLANT
GLOVE BIOGEL PI IND STRL 8 (GLOVE) ×2 IMPLANT
GLOVE BIOGEL PI INDICATOR 8 (GLOVE) ×2
GOWN STRL REUS W/ TWL LRG LVL3 (GOWN DISPOSABLE) ×4 IMPLANT
GOWN STRL REUS W/ TWL XL LVL3 (GOWN DISPOSABLE) ×2 IMPLANT
GOWN STRL REUS W/TWL LRG LVL3 (GOWN DISPOSABLE) ×6
GOWN STRL REUS W/TWL XL LVL3 (GOWN DISPOSABLE) ×2
KIT BASIN OR (CUSTOM PROCEDURE TRAY) ×2 IMPLANT
KIT OSTOMY DRAINABLE 2.75 STR (WOUND CARE) ×1 IMPLANT
KIT ROOM TURNOVER OR (KITS) ×2 IMPLANT
LIGASURE IMPACT 36 18CM CVD LR (INSTRUMENTS) ×2 IMPLANT
NS IRRIG 1000ML POUR BTL (IV SOLUTION) ×4 IMPLANT
PACK GENERAL/GYN (CUSTOM PROCEDURE TRAY) ×2 IMPLANT
PAD ARMBOARD 7.5X6 YLW CONV (MISCELLANEOUS) ×4 IMPLANT
PENCIL BUTTON HOLSTER BLD 10FT (ELECTRODE) ×2 IMPLANT
SPECIMEN JAR X LARGE (MISCELLANEOUS) ×2 IMPLANT
SPONGE LAP 18X18 X RAY DECT (DISPOSABLE) ×4 IMPLANT
STAPLER CUT CVD 40MM BLUE (STAPLE) ×1 IMPLANT
STAPLER CUT RELOAD BLUE (STAPLE) ×1 IMPLANT
STAPLER VISISTAT 35W (STAPLE) ×2 IMPLANT
SUCTION POOLE TIP (SUCTIONS) ×2 IMPLANT
SUT PDS AB 1 TP1 96 (SUTURE) ×4 IMPLANT
SUT SILK 2 0 SH CR/8 (SUTURE) ×2 IMPLANT
SUT SILK 2 0 TIES 10X30 (SUTURE) ×2 IMPLANT
SUT SILK 3 0 SH CR/8 (SUTURE) ×2 IMPLANT
SUT SILK 3 0 TIES 10X30 (SUTURE) ×2 IMPLANT
SUT VIC AB 3-0 SH 18 (SUTURE) ×1 IMPLANT
SUT VIC AB 3-0 SH 27 (SUTURE) ×4
SUT VIC AB 3-0 SH 27XBRD (SUTURE) IMPLANT
SYR BULB IRRIGATION 50ML (SYRINGE) ×2 IMPLANT
TOWEL OR 17X26 10 PK STRL BLUE (TOWEL DISPOSABLE) ×3 IMPLANT
TRAY FOLEY W/METER SILVER 14FR (SET/KITS/TRAYS/PACK) ×2 IMPLANT
TUBE CONNECTING 12X1/4 (SUCTIONS) ×2 IMPLANT
WATER STERILE IRR 1000ML POUR (IV SOLUTION) ×2 IMPLANT
YANKAUER SUCT BULB TIP NO VENT (SUCTIONS) ×2 IMPLANT

## 2016-05-18 NOTE — Progress Notes (Signed)
[facility] PROGRESS NOTE  Christopher [name] GLO:[id number] DOB: 1960-03-20 DOA: 05/16/2016  PCP: Minerva Ends, MD  Brief History/Interval Summary: 56 year old Caucasian male with medical history significant of CAD/MI status post cardiac catheterization with stent placement and AICD, CHF, C KD, COPD, hyperlipidemia, tobacco use , presented with complaints of abdominal pain and distention. He was scheduled to undergo colonoscopy. He underwent this and was found to have an obstructing mass in the distal descending colon. [name] was hospitalized for further management.  Reason for Visit: Obstructing mass in the distal descending colon  Consultants: Gastroenterology. General surgery. Cardiology.  Procedures:   Transthoracic echocardiogram has been ordered  Plan is for partial colectomy, colostomy 4/20  Antibiotics: None  Subjective/Interval History: Patient mentioned that he's had a lot of abdominal cramps overnight. Has been passing some gas. Denies any nausea or vomiting. No chest pain or shortness of breath.   ROS: Denies any headaches.  Objective:  Vital Signs  Vitals:   05/17/16 2004 05/18/16 0314 05/18/16 0437 05/18/16 1230  BP: 98/61  112/69   Pulse: 76  81   Resp: 18  18   Temp: 98.2 F (36.8 C)  98.4 F (36.9 C) 97.9 F (36.6 C)  TempSrc: Oral  Oral   SpO2: 92%  97%   Weight:  73.1 kg (161 lb 1.6 oz)    Height:        Intake/Output Summary (Last 24 hours) at 05/18/16 1248 Last data filed at 05/18/16 1215  Gross per 24 hour  Intake             1850 ml  Output              125 ml  Net             1725 ml   Filed Weights   05/16/16 1140 05/17/16 0500 05/18/16 0314  Weight: 71.7 kg (158 lb) 71.5 kg (157 lb 10.1 oz) 73.1 kg (161 lb 1.6 oz)    General appearance: alert, cooperative, appears stated age and no distress Resp: clear to auscultation bilaterally Cardio: regular rate and rhythm, S1, S2 normal, no murmur, click, rub or gallop GI:  Abdomen is soft. Slightly distended today. Tender to palpation without any rebound, rigidity or guarding. Bowel sounds are hyperactive.  No masses or organomegaly. Extremities: extremities normal, atraumatic, no cyanosis or edema Neurologic: No focal neurological deficits  Lab Results:  Data Reviewed: I have personally reviewed following labs and imaging studies  CBC:  Recent Labs Lab 05/16/16 1016 05/17/16 0350 05/18/16 0539  WBC  --  10.9* 3.9*  HGB 12.6* 10.2* 10.0*  HCT 37.0* 31.0* 31.7*  MCV  --  86.1 85.9  PLT  --  150 137*    Basic Metabolic Panel:  Recent Labs Lab 05/16/16 1016 05/17/16 0350 05/18/16 0539  NA 140 139 140  K 3.7 3.4* 3.3*  CL 101 103 103  CO2  --  23 26  GLUCOSE 88 108* 126*  BUN 18 18 20   CREATININE 1.30* 1.23 1.25*  CALCIUM  --  8.6* 8.7*    GFR: Estimated Creatinine Clearance: 66.1 mL/min (A) (by C-G formula based on SCr of 1.25 mg/dL (H)).   Radiology Studies: Dg Abd 1 View  Result Date: 05/17/2016 CLINICAL DATA:  History of colonic malignancy, bowel obstruction. EXAM: ABDOMEN - 1 VIEW COMPARISON:  Abdominal and pelvic CT scan of May 09, 2016 FINDINGS: There is moderate gaseous distention of the transverse and proximal descending colon. There  is a moderate amount of gas within the cecum. The rectosigmoid exhibits only a small amount of gas. No small bowel obstructive pattern is observed. No free extraluminal gas collections are demonstrated. IMPRESSION: Persistent moderate gaseous distention of the colon with transition zone in the mid descending colon. No high-grade obstruction. No evidence of perforation. Electronically Signed   By: David  Martinique M.D.   On: 05/17/2016 07:51   Ct Chest W Contrast  Result Date: 05/17/2016 CLINICAL DATA:  Mass lesion identified in the descending colon on CT abdomen and pelvis 05/09/2016. Staging examination. EXAM: CT CHEST WITH CONTRAST TECHNIQUE: Multidetector CT imaging of the chest was performed during  intravenous contrast administration. CONTRAST:  100 ml ISOVUE-300 IOPAMIDOL (ISOVUE-300) INJECTION 61% COMPARISON:  PA and lateral chest 12/27/2014. CT abdomen and pelvis 05/09/2016. FINDINGS: Cardiovascular: There is mild cardiomegaly. No pericardial effusion. Calcific aortic and coronary atherosclerosis is identified. Stent in the LAD is noted. Mediastinum/Nodes: No enlarged mediastinal, hilar, or axillary lymph nodes. Thyroid gland, trachea, and esophagus demonstrate no significant findings. Lungs/Pleura: No pleural effusion. The pulmonary nodule, mass or consolidative process. Minimal dependent atelectasis is seen. Centrilobular emphysematous disease is identified. Upper Abdomen: There is marked distention of the transverse colon which is worse than on the prior exam. Scarring upper pole left kidney is noted. Cyst in the upper pole of the right kidney is identified. Musculoskeletal: No acute abnormality. No lytic or sclerotic lesion. IMPRESSION: Negative for metastatic or acute abnormality in the chest. Cardiomegaly. Calcific aortic and coronary atherosclerosis. Coronary artery stent is noted. Centrilobular emphysema. Worsened gaseous distention of the transverse colon since the comparison CT abdomen and pelvis. Electronically Signed   By: Inge Rise M.D.   On: 05/17/2016 10:55     Medications:  Scheduled: . [MAR Hold] aspirin  81 mg Oral Daily  . [MAR Hold] atorvastatin  80 mg Oral q1800  . [MAR Hold] carvedilol  6.25 mg Oral BID WC  . cefoTEtan in Dextrose 5%      . [MAR Hold] digoxin  0.125 mg Oral Daily  . [MAR Hold] lisinopril  2.5 mg Oral Daily  . morphine   Intravenous Q4H  . morphine       Continuous: . [MAR Hold] albumin human    . dextrose 5 % and 0.45% NaCl 100 mL/hr at 05/18/16 0803  . lactated ringers Stopped (05/18/16 1215)   PRN:[MAR Hold] acetaminophen **OR** [MAR Hold] acetaminophen, diphenhydrAMINE **OR** diphenhydrAMINE, [MAR Hold] fentaNYL (SUBLIMAZE) injection,  HYDROmorphone (DILAUDID) injection, meperidine (DEMEROL) injection, naloxone **AND** sodium chloride flush, [MAR Hold] nicotine polacrilex, [MAR Hold] ondansetron **OR** [MAR Hold] ondansetron (ZOFRAN) IV, ondansetron (ZOFRAN) IV, oxyCODONE **OR** oxyCODONE, promethazine  Assessment/Plan:  Principal Problem:   Partial bowel obstruction (HCC) Active Problems:   Chronic systolic CHF (congestive heart failure) (HCC)   Hyperlipidemia   AICD (automatic cardioverter/defibrillator) present   Anemia due to blood loss   Melena   Colonic mass   Essential hypertension   AKI (acute kidney injury) ([place])    Obstructing cancer in the distal descending colon causing Partial bowel obstruction Pathology shows adenocarcinoma. This mass is partially obstructing the colon. Patient seen by general surgery. Plan is for surgical intervention. CT chest did not show any metastatic lesions.  History of coronary artery disease/chronic systolic CHF, status post AICD Followed by Dr. Wynonia Lawman of cardiology. Last cath w/ stent placement 11/2015. EF 20% on last echo from 2015. Currently well compensated and w/o ACS symptoms. Seen by Dr. Wynonia Lawman. Plavix has been discontinued. Echocardiogram has been  ordered. Continue ASA, lipitor. Continue coreg and digoxin, lisinopril. Monitor fluid status closely in the postoperative period.  Mild AKI Continue to monitor urine output. Creatinine is stable.  Normocytic anemia Hemoglobin is close to baseline. No evidence for overt bleeding, although he may have had black stools recently.  Tobacco use Nicotine PRN  DVT Prophylaxis: SCDs Code Status: Full code  Family Communication: Discussed with patient  Disposition Plan: Management as discussed above. Surgery is planned for today.    LOS: 2 days   [facility] Pager [phone] 05/18/2016, 12:48 PM  If 7PM-7AM, please contact night-coverage at [email or web address], password [facility]

## 2016-05-18 NOTE — Progress Notes (Signed)
2 Days Post-Op  Subjective: CC bloated  Objective: Vital signs in last 24 hours: Temp:  [98.1 F (36.7 C)-98.4 F (36.9 C)] 98.4 F (36.9 C) (04/20 0437) Pulse Rate:  [76-81] 81 (04/20 0437) Resp:  [17-18] 18 (04/20 0437) BP: (98-112)/(61-70) 112/69 (04/20 0437) SpO2:  [92 %-97 %] 97 % (04/20 0437) Weight:  [73.1 kg (161 lb 1.6 oz)] 73.1 kg (161 lb 1.6 oz) (04/20 0314) Last BM Date: 05/17/16  Intake/Output from previous day: 04/19 0701 - 04/20 0700 In: 400 [I.V.:400] Out: 100 [Urine:100] Intake/Output this shift: No intake/output data recorded.  General appearance: cooperative Resp: clear to auscultation bilaterally Cardio: regular rate and rhythm GI: distended, +BS, soft  Lab Results:   Recent Labs  05/17/16 0350 05/18/16 0539  WBC 10.9* 3.9*  HGB 10.2* 10.0*  HCT 31.0* 31.7*  PLT 150 137*   BMET  Recent Labs  05/17/16 0350 05/18/16 0539  NA 139 140  K 3.4* 3.3*  CL 103 103  CO2 23 26  GLUCOSE 108* 126*  BUN 18 20  CREATININE 1.23 1.25*  CALCIUM 8.6* 8.7*   PT/INR No results for input(s): LABPROT, INR in the last 72 hours. ABG No results for input(s): PHART, HCO3 in the last 72 hours.  Invalid input(s): PCO2, PO2  Studies/Results: Dg Abd 1 View  Result Date: 05/17/2016 CLINICAL DATA:  History of colonic malignancy, bowel obstruction. EXAM: ABDOMEN - 1 VIEW COMPARISON:  Abdominal and pelvic CT scan of May 09, 2016 FINDINGS: There is moderate gaseous distention of the transverse and proximal descending colon. There is a moderate amount of gas within the cecum. The rectosigmoid exhibits only a small amount of gas. No small bowel obstructive pattern is observed. No free extraluminal gas collections are demonstrated. IMPRESSION: Persistent moderate gaseous distention of the colon with transition zone in the mid descending colon. No high-grade obstruction. No evidence of perforation. Electronically Signed   By: David  Martinique M.D.   On: 05/17/2016 07:51    Ct Chest W Contrast  Result Date: 05/17/2016 CLINICAL DATA:  Mass lesion identified in the descending colon on CT abdomen and pelvis 05/09/2016. Staging examination. EXAM: CT CHEST WITH CONTRAST TECHNIQUE: Multidetector CT imaging of the chest was performed during intravenous contrast administration. CONTRAST:  100 ml ISOVUE-300 IOPAMIDOL (ISOVUE-300) INJECTION 61% COMPARISON:  PA and lateral chest 12/27/2014. CT abdomen and pelvis 05/09/2016. FINDINGS: Cardiovascular: There is mild cardiomegaly. No pericardial effusion. Calcific aortic and coronary atherosclerosis is identified. Stent in the LAD is noted. Mediastinum/Nodes: No enlarged mediastinal, hilar, or axillary lymph nodes. Thyroid gland, trachea, and esophagus demonstrate no significant findings. Lungs/Pleura: No pleural effusion. The pulmonary nodule, mass or consolidative process. Minimal dependent atelectasis is seen. Centrilobular emphysematous disease is identified. Upper Abdomen: There is marked distention of the transverse colon which is worse than on the prior exam. Scarring upper pole left kidney is noted. Cyst in the upper pole of the right kidney is identified. Musculoskeletal: No acute abnormality. No lytic or sclerotic lesion. IMPRESSION: Negative for metastatic or acute abnormality in the chest. Cardiomegaly. Calcific aortic and coronary atherosclerosis. Coronary artery stent is noted. Centrilobular emphysema. Worsened gaseous distention of the transverse colon since the comparison CT abdomen and pelvis. Electronically Signed   By: Inge Rise M.D.   On: 05/17/2016 10:55    Anti-infectives: Anti-infectives    None      Assessment/Plan: Obstructing descending colon mass, prelim path adenocarcinoma - Cardiac clearance, per Dr. Wynonia Lawman, AICD deactivated during surgery - OR today for partial  colectomy, colostomy. Procedure, risks, benefits discussed again with him. I also spoke with his brother. FEN - albumin bolus pre-op   LOS: 2 days    Briany Aye E 05/18/2016

## 2016-05-18 NOTE — Anesthesia Postprocedure Evaluation (Signed)
Anesthesia Post Note  Patient: Christopher [name]  Procedure(s) Performed: Procedure(s) (LRB): COLECTOMY WITH OSTOMY CREATION/HARTMANN PROCEDURE (Left)  Patient location during evaluation: PACU Anesthesia Type: General Level of consciousness: awake and alert Pain management: pain level controlled Vital Signs Assessment: post-procedure vital signs reviewed and stable Respiratory status: spontaneous breathing, nonlabored ventilation and respiratory function stable Cardiovascular status: blood pressure returned to baseline and stable Postop Assessment: no signs of nausea or vomiting Anesthetic complications: no       Last Vitals:  Vitals:   05/18/16 1316 05/18/16 1325  BP:  111/74  Pulse:  73  Resp: 17 20  Temp:  36.7 C    Last Pain:  Vitals:   05/18/16 1316  TempSrc:   PainSc: [place]

## 2016-05-18 NOTE — Op Note (Signed)
05/16/2016 - 05/18/2016  3:29 PM  PATIENT:  Christopher [name]  56 y.o. male  PRE-OPERATIVE DIAGNOSIS:  Obstructing Colon Mass  POST-OPERATIVE DIAGNOSIS:  Obstructing Colon Mass  PROCEDURE:  Procedure(s): LEFT COLECTOMY WITH OSTOMY CREATION/HARTMANN PROCEDURE  SURGEON:  Surgeon(s): [name], MD  ASSISTANTS: none   ANESTHESIA:   general  EBL:  Total I/O In: 5726 [I.V.:1200; IV Piggyback:250] Out: 225 [Urine:175; Blood:50]  BLOOD ADMINISTERED:none  DRAINS: none   SPECIMEN:  Excision  DISPOSITION OF SPECIMEN:  PATHOLOGY  COUNTS:  YES  DICTATION: .Dragon Dictation  Findings: Distal left colon mass, significantly dilated proximal colon and small bowel. Chronic obstruction.  Procedure in detail: Hansel presents for partial colectomy with colostomy for obstructing colon cancer. He was identified in the preop holding area. He received intravenous antibiotics. Informed consent was obtained. He was taken to the operating room. Foley catheter was placed by nursing. His abdomen was prepped and draped in a sterile fashion. We did time out procedure. Midline incision was made. Subcutaneous tissues were dissected down revealing the anterior fascia. This was divided sharply along the midline. The perineal cavity was entered under direct vision carefully due to his bowel distention. There was moderate serosanguineous ascites. The fascia was opened the length of the incision. A Bookwalter retractor was placed. The tumor was palpable, was in the distal left colon. The proximal sigmoid and distal left colon were mobilized from lateral peritoneal attachments. This was continued along the white line more cephalad. The colon was divided several centimeters distal to the palpable mass. We used a contour stapler. Next the mesentery was taken down carefully with the LigaSure. We stayed away from the area where the ureter would be. He had a short mesentery but I did the best oncologic resection I could.  Next, the proximal colon was divided and the specimen was marked for pathology. It was passed off the field. The remaining colon was mobilized further to facilitate ostomy placement. A circular incision was made in the left upper quadrant. A cruciate incision was made in the fascia and the end of the colon was brought out. The abdomen was copiously irrigated. Hemostasis was ensured. We checked our counts. The bowel was returned to anatomic position. Fascia was closed with running #1 looped PDS tied in the middle. Subcutaneous tissues were closed with staples. The colostomy was matured with 3-0 Vicryl. It was viable and began to have stool output immediately. Sterile dressings were applied and he was brought to the recovery in stable condition. There were no apparent complications. PATIENT DISPOSITION:  PACU - hemodynamically stable.   Delay start of Pharmacological VTE agent (>24hrs) due to surgical blood loss or risk of bleeding:  no  [name], MD, MPH, FACS Pager: [phone]  4/20/20183:29 PM

## 2016-05-18 NOTE — Anesthesia Procedure Notes (Signed)
Procedure Name: Intubation Date/Time: 05/18/2016 10:17 AM Performed by: Valda Favia Pre-anesthesia Checklist: Patient identified, Emergency Drugs available, Suction available and Patient being monitored Patient Re-evaluated:Patient Re-evaluated prior to inductionOxygen Delivery Method: Circle System Utilized Preoxygenation: Pre-oxygenation with 100% oxygen Intubation Type: IV induction Ventilation: Mask ventilation without difficulty and Oral airway inserted - appropriate to patient size Laryngoscope Size: Sabra Heck and 3 Grade View: Grade I Tube type: Oral Tube size: 7.5 mm Number of attempts: 1 Airway Equipment and Method: Stylet and Oral airway Placement Confirmation: ETT inserted through vocal cords under direct vision,  positive ETCO2 and breath sounds checked- equal and bilateral Secured at: 23 cm Tube secured with: Tape Dental Injury: Teeth and Oropharynx as per pre-operative assessment

## 2016-05-18 NOTE — Transfer of Care (Signed)
Immediate Anesthesia Transfer of Care Note  Patient: Christopher [name]  Procedure(s) Performed: Procedure(s): COLECTOMY WITH OSTOMY CREATION/HARTMANN PROCEDURE (Left)  Patient Location: PACU  Anesthesia Type:General  Level of Consciousness: awake, alert  and oriented  Airway & Oxygen Therapy: Patient Spontanous Breathing, Patient connected to nasal cannula oxygen and defibrillator pads connected to zoll in PACU  Post-op Assessment: Report given to RN and Post -op Vital signs reviewed and stable  Post vital signs: Reviewed and stable  Last Vitals:  Vitals:   05/17/16 2004 05/18/16 0437  BP: 98/61 112/69  Pulse: 76 81  Resp: 18 18  Temp: 36.8 C 36.9 C    Last Pain:  Vitals:   05/18/16 0715  TempSrc:   PainSc: 0-No pain      Patients Stated Pain Goal: 3 (83/15/17 6160)  Complications: No apparent anesthesia complications

## 2016-05-18 NOTE — Anesthesia Preprocedure Evaluation (Signed)
Anesthesia Evaluation  Patient identified by MRN, date of birth, ID band Patient awake    Reviewed: Allergy & Precautions, H&P , NPO status , [name] Chart, lab work & pertinent test results, reviewed documented beta blocker date and time   Airway Mallampati: II  TM Distance: >3 FB Neck ROM: Full    Dental no notable dental hx. (+) Teeth Intact, Dental Advisory Given   Pulmonary COPD, former smoker,    Pulmonary exam normal breath sounds clear to auscultation       Cardiovascular + CAD, + Past MI, + Cardiac Stents and +CHF  + Cardiac Defibrillator  Rhythm:Regular Rate:Normal     Neuro/Psych Anxiety negative neurological ROS  negative psych ROS   GI/Hepatic negative GI ROS, Neg liver ROS,   Endo/Other  negative endocrine ROS  Renal/GU Renal InsufficiencyRenal diseasenegative Renal ROS  negative genitourinary   Musculoskeletal   Abdominal   Peds  Hematology negative hematology ROS (+) anemia ,   Anesthesia Other Findings   Reproductive/Obstetrics negative OB ROS                             Anesthesia Physical  Anesthesia Plan  ASA: IV  Anesthesia Plan: General   Post-op Pain Management:    Induction: Intravenous  Airway Management Planned: Oral ETT  Additional Equipment: Arterial line  Intra-op Plan:   Post-operative Plan: Extubation in OR  Informed Consent: I have reviewed the patients History and Physical, chart, labs and discussed the procedure including the risks, benefits and alternatives for the proposed anesthesia with the patient or authorized representative who has indicated his/her understanding and acceptance.   Dental advisory given  Plan Discussed with: CRNA  Anesthesia Plan Comments:         Anesthesia Quick Evaluation

## 2016-05-19 ENCOUNTER — Encounter (HOSPITAL_COMMUNITY): Payer: Self-pay | Admitting: General Surgery

## 2016-05-19 DIAGNOSIS — C186 Malignant neoplasm of descending colon: Principal | ICD-10-CM

## 2016-05-19 DIAGNOSIS — K5669 Other partial intestinal obstruction: Secondary | ICD-10-CM

## 2016-05-19 DIAGNOSIS — Z01810 Encounter for preprocedural cardiovascular examination: Secondary | ICD-10-CM

## 2016-05-19 LAB — BASIC METABOLIC PANEL
Anion gap: 7 (ref 5–15)
BUN: 17 mg/dL (ref 6–20)
CHLORIDE: 101 mmol/L (ref 101–111)
CO2: 28 mmol/L (ref 22–32)
CREATININE: 1.2 mg/dL (ref 0.61–1.24)
Calcium: 8.1 mg/dL — ABNORMAL LOW (ref 8.9–10.3)
GFR calc Af Amer: >60 mL/min (ref >60)
GFR calc non Af Amer: >60 mL/min (ref >60)
Glucose, Bld: 150 mg/dL — ABNORMAL HIGH (ref 65–99)
Potassium: 3.6 mmol/L (ref 3.5–5.1)
SODIUM: 136 mmol/L (ref 135–145)

## 2016-05-19 LAB — CBC
HCT: 29.9 % — ABNORMAL LOW (ref 39.0–52.0)
HEMOGLOBIN: 9.4 g/dL — AB (ref 13.0–17.0)
MCH: 27.2 pg (ref 26.0–34.0)
MCHC: 31.4 g/dL (ref 30.0–36.0)
MCV: 86.7 fL (ref 78.0–100.0)
Platelets: 124 10*3/uL — ABNORMAL LOW (ref 150–400)
RBC: 3.45 MIL/uL — ABNORMAL LOW (ref 4.22–5.81)
RDW: 17.7 % — AB (ref 11.5–15.5)
WBC: 9.1 10*3/uL (ref 4.0–10.5)

## 2016-05-19 MED ORDER — CARVEDILOL 3.125 MG PO TABS
3.1250 mg | ORAL_TABLET | Freq: Two times a day (BID) | ORAL | Status: DC
  Administered 2016-05-20 – 2016-05-29 (×15): 3.125 mg via ORAL
  Filled 2016-05-19 (×17): qty 1

## 2016-05-19 MED ORDER — WHITE PETROLATUM GEL
Status: AC
  Administered 2016-05-19: 12:00:00
  Filled 2016-05-19: qty 1

## 2016-05-19 MED ORDER — SODIUM CHLORIDE 0.9 % IV SOLN
30.0000 meq | Freq: Once | INTRAVENOUS | Status: AC
  Administered 2016-05-19: 30 meq via INTRAVENOUS
  Filled 2016-05-19: qty 15

## 2016-05-19 NOTE — Progress Notes (Signed)
1 Day Post-Op  Subjective: No acute events. Pain well controlled. No nausea but some belching this morning  Objective: Vital signs in last 24 hours: Temp:  [97.7 F (36.5 C)-98.1 F (36.7 C)] 98 F (36.7 C) (04/21 0530) Pulse Rate:  [73-86] 74 (04/21 0530) Resp:  [14-30] 22 (04/21 0800) BP: (93-120)/(59-80) 93/80 (04/21 0530) SpO2:  [91 %-100 %] 95 % (04/21 0800) Arterial Line BP: (120-133)/(59-66) 120/59 (04/20 1325) Weight:  [72.3 kg (159 lb 8 oz)] 72.3 kg (159 lb 8 oz) (04/21 0500) Last BM Date: 05/17/16 (PR)  Intake/Output from previous day: 04/20 0701 - 04/21 0700 In: 2450 [I.V.:2200; IV Piggyback:250] Out: 650 [Urine:600; Blood:50] Intake/Output this shift: No intake/output data recorded.  General appearance: alert and cooperative Resp: clear to auscultation bilaterally GI: soft, minimally distended, appropriately tender. LLQ colostomy pink and edematous with some liquid stool in bag Incision/Wound: c/d/I under honeycomb  Lab Results:   Recent Labs  05/18/16 0539 05/19/16 0558  WBC 3.9* 9.1  HGB 10.0* 9.4*  HCT 31.7* 29.9*  PLT 137* 124*   BMET  Recent Labs  05/18/16 0539 05/19/16 0558  NA 140 136  K 3.3* 3.6  CL 103 101  CO2 26 28  GLUCOSE 126* 150*  BUN 20 17  CREATININE 1.25* 1.20  CALCIUM 8.7* 8.1*   PT/INR No results for input(s): LABPROT, INR in the last 72 hours. ABG No results for input(s): PHART, HCO3 in the last 72 hours.  Invalid input(s): PCO2, PO2  Studies/Results: Ct Chest W Contrast  Result Date: 05/17/2016 CLINICAL DATA:  Mass lesion identified in the descending colon on CT abdomen and pelvis 05/09/2016. Staging examination. EXAM: CT CHEST WITH CONTRAST TECHNIQUE: Multidetector CT imaging of the chest was performed during intravenous contrast administration. CONTRAST:  100 ml ISOVUE-300 IOPAMIDOL (ISOVUE-300) INJECTION 61% COMPARISON:  PA and lateral chest 12/27/2014. CT abdomen and pelvis 05/09/2016. FINDINGS: Cardiovascular:  There is mild cardiomegaly. No pericardial effusion. Calcific aortic and coronary atherosclerosis is identified. Stent in the LAD is noted. Mediastinum/Nodes: No enlarged mediastinal, hilar, or axillary lymph nodes. Thyroid gland, trachea, and esophagus demonstrate no significant findings. Lungs/Pleura: No pleural effusion. The pulmonary nodule, mass or consolidative process. Minimal dependent atelectasis is seen. Centrilobular emphysematous disease is identified. Upper Abdomen: There is marked distention of the transverse colon which is worse than on the prior exam. Scarring upper pole left kidney is noted. Cyst in the upper pole of the right kidney is identified. Musculoskeletal: No acute abnormality. No lytic or sclerotic lesion. IMPRESSION: Negative for metastatic or acute abnormality in the chest. Cardiomegaly. Calcific aortic and coronary atherosclerosis. Coronary artery stent is noted. Centrilobular emphysema. Worsened gaseous distention of the transverse colon since the comparison CT abdomen and pelvis. Electronically Signed   By: Inge Rise M.D.   On: 05/17/2016 10:55    Anti-infectives: Anti-infectives    Start     Dose/Rate Route Frequency Ordered Stop   05/18/16 0900  cefoTEtan (CEFOTAN) 2 g in dextrose 5 % 50 mL IVPB     2 g 100 mL/hr over 30 Minutes Intravenous To [facility] 05/18/16 0826 05/18/16 1035   05/18/16 0827  cefoTEtan in Dextrose 5% (CEFOTAN) 2-2.08 GM-% IVPB    Comments:  Laurita Quint   : cabinet override      05/18/16 0827 05/18/16 2029      Assessment/Plan: s/p Procedure(s): COLECTOMY WITH OSTOMY CREATION/HARTMANN PROCEDURE (Left) 05/18/16 Dr [name] d/c foley continue clears for now  PT/ mobilize Pulmonary toilet  LOS: 3 days  Brookie Wayment A Kae Heller 05/19/2016

## 2016-05-19 NOTE — Evaluation (Signed)
Physical Therapy Evaluation Patient Details Name: Christopher [name] MRN: [id number] DOB: [birth year] Today's Date: 05/19/2016   History of Present Illness  Pt is a 56 y/o male presenting to ED on 4/18 with bowel pain and found to have a mass concerning for malignancy. Pt is now s/p L colectomy with colostomy creation in L UQ on 4/20. PMH including but not limited to CAD s/p MI in 2015, AICD placement in 2015, CHF, CKD, and COPD.  Clinical Impression  Pt presented supine in bed with HOB elevated, awake and willing to participate in therapy session. Prior to admission, pt reported that he was independent with all functional mobility and ADLs. Pt lives alone in an apartment on the second floor that requires him to ascend 15 steps to enter. Pt stated that he does not have any reliable family or friends to assist him upon d/c. Pt currently very limited secondary to pain and fatigue. Pt would continue to benefit from skilled physical therapy services at this time while admitted and after d/c to address the below listed limitations in order to improve overall safety and independence with functional mobility.     Follow Up Recommendations SNF;Supervision/Assistance - 24 hour    Equipment Recommendations  Rolling walker with 5" wheels;3in1 (PT)    Recommendations for Other Services       Precautions / Restrictions Precautions Precautions: Fall Restrictions Weight Bearing Restrictions: No      Mobility  Bed Mobility Overal bed mobility: Needs Assistance Bed Mobility: Supine to Sit;Sit to Supine     Supine to sit: Supervision Sit to supine: Min assist   General bed mobility comments: increased time and effort, use of bed rails, supervision for safety to achieve sitting EOB, min A at bilateral LEs to return to supine  Transfers Overall transfer level: Needs assistance Equipment used: 1 person hand held assist Transfers: Sit to/from Stand Sit to Stand: Min assist         General  transfer comment: increased time, min A for stability with transition into standing from bed  Ambulation/Gait Ambulation/Gait assistance: Min assist Ambulation Distance (Feet): 5 Feet Assistive device: 1 person hand held assist Gait Pattern/deviations: Step-to pattern;Step-through pattern;Decreased step length - right;Decreased step length - left;Decreased stride length Gait velocity: decreased Gait velocity interpretation: Below normal speed for age/gender General Gait Details: distance limited greatly by pain and fatigue. pt with modest instability that required constant min A with one HHA  Stairs            Wheelchair Mobility    Modified Rankin (Stroke Patients Only)       Balance Overall balance assessment: Needs assistance Sitting-balance support: No upper extremity supported Sitting balance-Leahy Scale: Fair     Standing balance support: During functional activity;Single extremity supported Standing balance-Leahy Scale: Poor Standing balance comment: pt with increased sway in static stance, close min guard to min A required                             Pertinent Vitals/Pain Pain Assessment: Faces Faces Pain Scale: Hurts even more Pain Location: abdomen Pain Descriptors / Indicators: Sore;Grimacing;Guarding Pain Intervention(s): Monitored during session;Repositioned    Home Living Family/patient expects to be discharged to:: Private residence Living Arrangements: Alone Available Help at Discharge: Other (Comment) (pt reports no reliable assistance upon d/c) Type of Home: Apartment Home Access: Stairs to enter Entrance Stairs-Rails: Right;Left Entrance Stairs-Number of Steps: 15 Home Layout: One level Home Equipment:  None      Prior Function Level of Independence: Independent               Hand Dominance        Extremity/Trunk Assessment   Upper Extremity Assessment Upper Extremity Assessment: Overall WFL for tasks assessed     Lower Extremity Assessment Lower Extremity Assessment: Overall WFL for tasks assessed    Cervical / Trunk Assessment Cervical / Trunk Assessment: Normal  Communication   Communication: No difficulties  Cognition Arousal/Alertness: Awake/alert Behavior During Therapy: Anxious Overall Cognitive Status: No family/caregiver present to determine baseline cognitive functioning Area of Impairment: Attention;Problem solving                   Current Attention Level: Focused         Problem Solving: Difficulty sequencing;Requires verbal cues        General Comments      Exercises     Assessment/Plan    PT Assessment Patient needs continued PT services  PT Problem List Decreased activity tolerance;Decreased balance;Decreased mobility;Decreased coordination;Decreased knowledge of use of DME;Decreased safety awareness;Decreased knowledge of precautions;Pain       PT Treatment Interventions DME instruction;Gait training;Stair training;Functional mobility training;Therapeutic activities;Balance training;Neuromuscular re-education;Therapeutic exercise;Patient/family education    PT Goals (Current goals can be found in the Care Plan section)  Acute Rehab PT Goals Patient Stated Goal: to get some sleep PT Goal Formulation: With patient Time For Goal Achievement: 06/02/16 Potential to Achieve Goals: Good    Frequency Min 3X/week   Barriers to discharge Decreased caregiver support      Co-evaluation               End of Session Equipment Utilized During Treatment: Gait belt (placed high on chest) Activity Tolerance: Patient limited by fatigue;Patient limited by pain Patient left: in bed;with call bell/phone within reach;with nursing/sitter in room Nurse Communication: Mobility status PT Visit Diagnosis: Unsteadiness on feet (R26.81);Other abnormalities of gait and mobility (R26.89);Pain Pain - part of body:  (abdomen)    Time: 5456-2563 PT Time Calculation  (min) (ACUTE ONLY): 24 min   Charges:   PT Evaluation $PT Eval Moderate Complexity: 1 Procedure PT Treatments $Therapeutic Activity: 8-22 mins   PT G Codes:        [place], PT, DPT [place] 05/19/2016, 3:51 PM

## 2016-05-19 NOTE — Progress Notes (Signed)
[facility] PROGRESS NOTE  Christopher [name] FAO:[id number] DOB: Dec 03, 1960 DOA: 05/16/2016  PCP: Minerva Ends, MD  Brief History/Interval Summary: 56 year old Caucasian male with medical history significant of CAD/MI status post cardiac catheterization with stent placement and AICD, CHF, C KD, COPD, hyperlipidemia, tobacco use , presented with complaints of abdominal pain and distention. He underwent colonoscopy and was found to have an obstructing mass in the distal descending colon. [name] was hospitalized for further management.  Reason for Visit: Obstructing mass in the distal descending colon  Consultants: Gastroenterology. General surgery. Cardiology.  Procedures:   Transthoracic echocardiogram has been ordered  Partial colectomy, colostomy 4/20  Antibiotics: None  Subjective/Interval History: Patient feels better this morning. Pain is reasonably well controlled. Denies any nausea or vomiting. Has abdominal pain over his surgical site.   ROS: Denies any headaches.  Objective:  Vital Signs  Vitals:   05/19/16 0100 05/19/16 0500 05/19/16 0530 05/19/16 0531  BP: (!) 94/59  93/80   Pulse: 80  74   Resp: 15  16 16   Temp: 97.7 F (36.5 C)  98 F (36.7 C)   TempSrc: Oral  Oral   SpO2: 96%  98% 98%  Weight:  72.3 kg (159 lb 8 oz)    Height:        Intake/Output Summary (Last 24 hours) at 05/19/16 0919 Last data filed at 05/19/16 0500  Gross per 24 hour  Intake             2450 ml  Output              650 ml  Net             1800 ml   Filed Weights   05/17/16 0500 05/18/16 0314 05/19/16 0500  Weight: 71.5 kg (157 lb 10.1 oz) 73.1 kg (161 lb 1.6 oz) 72.3 kg (159 lb 8 oz)    General appearance: Awake, alert. In no distress Resp: Clear to auscultation bilaterally Cardio: S1, S2 is normal, regular. No S3, S4 GI: Abdomen is soft. Tender over the incision site. Colostomy is noted. Bowel sounds are present but sluggish.  Extremities: No  edema Neurologic: No focal deficits  Lab Results:  Data Reviewed: I have personally reviewed following labs and imaging studies  CBC:  Recent Labs Lab 05/16/16 1016 05/17/16 0350 05/18/16 0539 05/19/16 0558  WBC  --  10.9* 3.9* 9.1  HGB 12.6* 10.2* 10.0* 9.4*  HCT 37.0* 31.0* 31.7* 29.9*  MCV  --  86.1 85.9 86.7  PLT  --  150 137* 124*    Basic Metabolic Panel:  Recent Labs Lab 05/16/16 1016 05/17/16 0350 05/18/16 0539 05/19/16 0558  NA 140 139 140 136  K 3.7 3.4* 3.3* 3.6  CL 101 103 103 101  CO2  --  23 26 28   GLUCOSE 88 108* 126* 150*  BUN 18 18 20 17   CREATININE 1.30* 1.23 1.25* 1.20  CALCIUM  --  8.6* 8.7* 8.1*    GFR: Estimated Creatinine Clearance: 68.9 mL/min (by C-G formula based on SCr of 1.2 mg/dL).   Radiology Studies: Ct Chest W Contrast  Result Date: 05/17/2016 CLINICAL DATA:  Mass lesion identified in the descending colon on CT abdomen and pelvis 05/09/2016. Staging examination. EXAM: CT CHEST WITH CONTRAST TECHNIQUE: Multidetector CT imaging of the chest was performed during intravenous contrast administration. CONTRAST:  100 ml ISOVUE-300 IOPAMIDOL (ISOVUE-300) INJECTION 61% COMPARISON:  PA and lateral chest 12/27/2014. CT abdomen and pelvis 05/09/2016. FINDINGS: Cardiovascular:  There is mild cardiomegaly. No pericardial effusion. Calcific aortic and coronary atherosclerosis is identified. Stent in the LAD is noted. Mediastinum/Nodes: No enlarged mediastinal, hilar, or axillary lymph nodes. Thyroid gland, trachea, and esophagus demonstrate no significant findings. Lungs/Pleura: No pleural effusion. The pulmonary nodule, mass or consolidative process. Minimal dependent atelectasis is seen. Centrilobular emphysematous disease is identified. Upper Abdomen: There is marked distention of the transverse colon which is worse than on the prior exam. Scarring upper pole left kidney is noted. Cyst in the upper pole of the right kidney is identified.  Musculoskeletal: No acute abnormality. No lytic or sclerotic lesion. IMPRESSION: Negative for metastatic or acute abnormality in the chest. Cardiomegaly. Calcific aortic and coronary atherosclerosis. Coronary artery stent is noted. Centrilobular emphysema. Worsened gaseous distention of the transverse colon since the comparison CT abdomen and pelvis. Electronically Signed   By: Inge Rise M.D.   On: 05/17/2016 10:55     Medications:  Scheduled: . aspirin  81 mg Oral Daily  . atorvastatin  80 mg Oral q1800  . [START ON 05/20/2016] carvedilol  3.125 mg Oral BID WC  . digoxin  0.125 mg Oral Daily  . lisinopril  2.5 mg Oral Daily  . morphine   Intravenous Q4H   Continuous: . albumin human    . dextrose 5 % and 0.45% NaCl 100 mL/hr at 05/19/16 0024  . potassium chloride (KCL MULTIRUN) 30 mEq in 265 mL IVPB     UPJ:SRPRXYVOPFYTW **OR** acetaminophen, diphenhydrAMINE **OR** diphenhydrAMINE, fentaNYL (SUBLIMAZE) injection, naloxone **AND** sodium chloride flush, nicotine polacrilex, ondansetron (ZOFRAN) IV, ondansetron **OR** [DISCONTINUED] ondansetron (ZOFRAN) IV  Assessment/Plan:  Principal Problem:   Partial bowel obstruction (HCC) Active Problems:   Chronic systolic CHF (congestive heart failure) (HCC)   Hyperlipidemia   AICD (automatic cardioverter/defibrillator) present   Anemia due to blood loss   Melena   Colonic mass   Essential hypertension   AKI (acute kidney injury) ([place])    Obstructing cancer in the distal descending colon causing Partial bowel obstruction Pathology shows adenocarcinoma. This mass is partially obstructing the colon. CT chest did not show any metastatic lesions. Patient seen by general surgery. Patient underwent partial colectomy and colostomy placement on 4/20. Seems to be stable postoperatively. General surgery is following. Foley could be discontinued later today.  History of coronary artery disease/chronic systolic CHF, status post AICD Followed  by Dr. Wynonia Lawman with cardiology. Last cath w/ stent placement 11/2015. EF 20% on last echo from 2015. Currently well compensated and w/o ACS symptoms. Seen by Dr. Wynonia Lawman preoperatively. Plavix was discontinued. Echocardiogram has been ordered. Continue ASA, lipitor. Continue coreg and digoxin, lisinopril. Monitor fluid status closely in the postoperative period. Seems to be stable.  Mild AKI Continue to monitor urine output. Creatinine is stable.  Normocytic anemia Hemoglobin is close to baseline. No evidence for overt bleeding, although he may have had black stools recently. Stable.  Tobacco use Nicotine PRN.  DVT Prophylaxis: SCDs Code Status: Full code  Family Communication: Discussed with patient  Disposition Plan: Patient appears to be stable postoperatively. Management as outlined above.    LOS: 3 days   [facility] Pager [phone] 05/19/2016, 9:19 AM  If 7PM-7AM, please contact night-coverage at [email or web address], password [facility]

## 2016-05-20 ENCOUNTER — Inpatient Hospital Stay (HOSPITAL_COMMUNITY): Payer: Medicare Other

## 2016-05-20 DIAGNOSIS — I255 Ischemic cardiomyopathy: Secondary | ICD-10-CM

## 2016-05-20 DIAGNOSIS — I25119 Atherosclerotic heart disease of native coronary artery with unspecified angina pectoris: Secondary | ICD-10-CM

## 2016-05-20 DIAGNOSIS — R079 Chest pain, unspecified: Secondary | ICD-10-CM

## 2016-05-20 LAB — BASIC METABOLIC PANEL WITH GFR
Anion gap: 9 (ref 5–15)
BUN: 15 mg/dL (ref 6–20)
CO2: 31 mmol/L (ref 22–32)
Calcium: 8.5 mg/dL — ABNORMAL LOW (ref 8.9–10.3)
Chloride: 98 mmol/L — ABNORMAL LOW (ref 101–111)
Creatinine, Ser: 1.12 mg/dL (ref 0.61–1.24)
GFR calc Af Amer: >60 mL/min
GFR calc non Af Amer: >60 mL/min
Glucose, Bld: 157 mg/dL — ABNORMAL HIGH (ref 65–99)
Potassium: 3.9 mmol/L (ref 3.5–5.1)
Sodium: 138 mmol/L (ref 135–145)

## 2016-05-20 LAB — CBC
HCT: 31.5 % — ABNORMAL LOW (ref 39.0–52.0)
Hemoglobin: 9.9 g/dL — ABNORMAL LOW (ref 13.0–17.0)
MCH: 27.3 pg (ref 26.0–34.0)
MCHC: 31.4 g/dL (ref 30.0–36.0)
MCV: 87 fL (ref 78.0–100.0)
Platelets: 118 K/uL — ABNORMAL LOW (ref 150–400)
RBC: 3.62 MIL/uL — ABNORMAL LOW (ref 4.22–5.81)
RDW: 17.4 % — ABNORMAL HIGH (ref 11.5–15.5)
WBC: 12.4 K/uL — ABNORMAL HIGH (ref 4.0–10.5)

## 2016-05-20 LAB — TROPONIN I
Troponin I: 0.05 ng/mL
Troponin I: 0.06 ng/mL
Troponin I: 0.06 ng/mL (ref <0.03)

## 2016-05-20 MED ORDER — ONDANSETRON HCL 4 MG/2ML IJ SOLN: 4.0000 mg | INTRAMUSCULAR | Status: DC | PRN

## 2016-05-20 MED ORDER — PROMETHAZINE HCL 25 MG/ML IJ SOLN
12.5000 mg | INTRAMUSCULAR | Status: DC | PRN
  Administered 2016-05-20 – 2016-05-23 (×2): 12.5 mg via INTRAVENOUS
  Filled 2016-05-20 (×2): qty 1

## 2016-05-20 MED ORDER — CLOPIDOGREL BISULFATE 75 MG PO TABS
75.0000 mg | ORAL_TABLET | Freq: Every day | ORAL | Status: DC
  Administered 2016-05-20 – 2016-05-21 (×2): 75 mg via ORAL
  Filled 2016-05-20 (×3): qty 1

## 2016-05-20 MED ORDER — NITROGLYCERIN 0.4 MG SL SUBL
0.4000 mg | SUBLINGUAL_TABLET | SUBLINGUAL | Status: DC | PRN
  Administered 2016-05-20 (×2): 0.4 mg via SUBLINGUAL
  Filled 2016-05-20: qty 1

## 2016-05-20 NOTE — Progress Notes (Signed)
0100 pt complaining of nausea and chest pain rated "10" out of "10".  He was unable to describe his pain;stated "it just hurts."  He received 4 mg Zofran IV at 2305 prior to this episode of nausea. He did not vomit.  His blood pressure was slightly elevated. Dr. Olevia Bowens notified. Pt received a total of 2 Nitroglycerin tabs and 12.5mg  Phenergan. Dr. Olevia Bowens notified of pt's EKG and Xray results. Pt's pain decreased from "10" to ''5" after administration of Nitro and Phenergan.

## 2016-05-20 NOTE — Discharge Planning (Signed)
Pt refusing to get OOB after much encouragement. Explained risks of immobilization post op. Will continue to encourage.

## 2016-05-20 NOTE — Progress Notes (Signed)
[facility] PROGRESS NOTE  Christopher [name] OZH:[id number] DOB: [birth year] DOA: 05/16/2016  PCP: Minerva Ends, MD  Brief History/Interval Summary: 56 year old Caucasian male with medical history significant of CAD/MI status post cardiac catheterization with stent placement and AICD, CHF, C KD, COPD, hyperlipidemia, tobacco use , presented with complaints of abdominal pain and distention. He underwent colonoscopy and was found to have an obstructing mass in the distal descending colon. [name] was hospitalized for further management.  Reason for Visit: Obstructing mass in the distal descending colon  Consultants: Gastroenterology. General surgery. Cardiology.  Procedures:   Transthoracic echocardiogram Study Conclusions - Left ventricle: The cavity size was mildly dilated. Wall   thickness was normal. Systolic function was severely reduced. The   estimated ejection fraction was in the range of 25% to 30%.   Akinesis of the anteroseptal, anterior, and apical myocardium.   Doppler parameters are consistent with abnormal left ventricular   relaxation (grade 1 diastolic dysfunction).  Partial colectomy, colostomy 4/20  Antibiotics: None  Subjective/Interval History: Patient developed abdominal bloating and chest pain overnight. Denies any chest pain currently. Denies any shortness of breath. Some nausea but no vomiting. Has abdominal pain over his surgical site.   ROS: Denies any headaches.  Objective:  Vital Signs  Vitals:   05/20/16 0243 05/20/16 0316 05/20/16 0532 05/20/16 0800  BP: 116/78  118/79   Pulse: 75  79   Resp: 16 11 15 20   Temp: 97.9 F (36.6 C)  98.3 F (36.8 C)   TempSrc: Oral  Oral   SpO2: 94% 96% 98% 98%  Weight:   75.2 kg (165 lb 11.2 oz)   Height:        Intake/Output Summary (Last 24 hours) at 05/20/16 1001 Last data filed at 05/20/16 0816  Gross per 24 hour  Intake              759 ml  Output              400 ml  Net               359 ml   Filed Weights   05/18/16 0314 05/19/16 0500 05/20/16 0532  Weight: 73.1 kg (161 lb 1.6 oz) 72.3 kg (159 lb 8 oz) 75.2 kg (165 lb 11.2 oz)    General appearance: Awake and alert. In no distress. Resp: Clear to auscultation bilaterally. No wheezing, rales or rhonchi. Somewhat diminished air entry at the bases Cardio: S1, S2 is normal, regular. No S3, S4. No rubs, murmurs, or bruit  GI: Abdomen appears to be slightly distended this morning. Bowel sounds are present. Tender over the incision site. Colostomy is present.  Extremities: No edema Neurologic: No focal deficits  Lab Results:  Data Reviewed: I have personally reviewed following labs and imaging studies  CBC:  Recent Labs Lab 05/16/16 1016 05/17/16 0350 05/18/16 0539 05/19/16 0558 05/20/16 0212  WBC  --  10.9* 3.9* 9.1 12.4*  HGB 12.6* 10.2* 10.0* 9.4* 9.9*  HCT 37.0* 31.0* 31.7* 29.9* 31.5*  MCV  --  86.1 85.9 86.7 87.0  PLT  --  150 137* 124* 118*    Basic Metabolic Panel:  Recent Labs Lab 05/16/16 1016 05/17/16 0350 05/18/16 0539 05/19/16 0558 05/20/16 0212  NA 140 139 140 136 138  K 3.7 3.4* 3.3* 3.6 3.9  CL 101 103 103 101 98*  CO2  --  23 26 28 31   GLUCOSE 88 108* 126* 150* 157*  BUN 18 18  20 17 15   CREATININE 1.30* 1.23 1.25* 1.20 1.12  CALCIUM  --  8.6* 8.7* 8.1* 8.5*    GFR: Estimated Creatinine Clearance: 73.8 mL/min (by C-G formula based on SCr of 1.12 mg/dL).   Radiology Studies: Dg Abd 1 View  Result Date: 05/20/2016 CLINICAL DATA:  Pain and distention EXAM: ABDOMEN - 1 VIEW COMPARISON:  CT 05/09/2016, KUB 05/17/2016 FINDINGS: Surgical staples over the midline abdomen. Mild diffuse gaseous dilatation of small and large bowel. Calcified phleboliths in the right pelvis. Decreased: Distension compared to prior radiograph. IMPRESSION: Decreased colonic distention compared to previous. Mild diffuse gaseous dilatation of small and large bowel could be secondary to an ileus.  Electronically Signed   By: Donavan Foil M.D.   On: 05/20/2016 01:57   Dg Chest Port 1 View  Result Date: 05/20/2016 CLINICAL DATA:  Chest and abdominal pain, shortness of breath EXAM: PORTABLE CHEST 1 VIEW COMPARISON:  05/17/2017, 12/27/2014 FINDINGS: Left-sided single lead ICD device. Mildly low lung volumes with bibasilar atelectasis. The heart appears borderline enlarged. Slight central vascular congestion. No pneumothorax. No large effusion. IMPRESSION: 1. Low lung volumes with mild bibasilar atelectasis 2. Borderline cardiomegaly with slight central congestion Electronically Signed   By: Donavan Foil M.D.   On: 05/20/2016 01:54     Medications:  Scheduled: . aspirin  81 mg Oral Daily  . atorvastatin  80 mg Oral q1800  . carvedilol  3.125 mg Oral BID WC  . digoxin  0.125 mg Oral Daily  . lisinopril  2.5 mg Oral Daily  . morphine   Intravenous Q4H   Continuous: . albumin human    . dextrose 5 % and 0.45% NaCl 50 mL/hr at 05/19/16 1948   XFG:[id number] **OR** acetaminophen, diphenhydrAMINE **OR** diphenhydrAMINE, fentaNYL (SUBLIMAZE) injection, naloxone **AND** sodium chloride flush, nicotine polacrilex, nitroGLYCERIN, ondansetron (ZOFRAN) IV, ondansetron **OR** [DISCONTINUED] ondansetron (ZOFRAN) IV, promethazine  Assessment/Plan:  Principal Problem:   Partial bowel obstruction (HCC) Active Problems:   Chronic systolic CHF (congestive heart failure) (HCC)   Hyperlipidemia   AICD (automatic cardioverter/defibrillator) present   Anemia due to blood loss   Melena   Colonic mass   Essential hypertension   AKI (acute kidney injury) ([place])    Obstructing cancer in the distal descending colon causing Partial bowel obstruction Pathology shows adenocarcinoma. This mass was partially obstructing the colon. CT chest did not show any metastatic lesions. Patient seen by general surgery. Patient underwent partial colectomy and colostomy placement on 4/20. General surgery is  following.   Chest Pain Patient developed chest pain overnight. Could be related to gas/GI in origin. EKG did show T inversion in lead 3 and aVF, but this has been seen previously as well. Minimal elevation in troponin, not consistent with acute coronary syndrome. Cardiology has also seen the patient. Continue to monitor for now.  History of coronary artery disease/chronic systolic CHF, status post AICD Followed by Dr. Wynonia Lawman with cardiology. Last cath w/ stent placement 11/2015. EF 20% on last echo from 2015. Currently well compensated and w/o ACS symptoms. Seen by Dr. Wynonia Lawman preoperatively. Plavix was discontinued. Echocardiogram has been ordered. Continue ASA, lipitor. Continue coreg and digoxin, lisinopril. Monitor fluid status closely in the postoperative period. Seems to be stable. Resume Plavix when okay with general surgery. Discussed with Dr. Windle Guard. Okay to resume Plavix.  Mild AKI Continue to monitor urine output. Creatinine is stable.  Normocytic anemia Hemoglobin is close to baseline. Stable.  Tobacco use Nicotine PRN.  DVT Prophylaxis: SCDs Code Status: Full  code  Family Communication: Discussed with patient  Disposition Plan: Patient appears to be stable postoperatively. Management as outlined above.    LOS: 4 days   [facility] Pager [phone] 05/20/2016, 10:01 AM  If 7PM-7AM, please contact night-coverage at [email or web address], password [facility]

## 2016-05-20 NOTE — Progress Notes (Signed)
Progress Note  Patient Name: Christopher [name] Date of Encounter: 05/20/2016  Primary Cardiologist: Dr. Landry Corporal  Subjective   Feels that his abdomen is bloated, also has abdominal pain. Feels like he has gas that needs to pass. Had emesis last night, also some chest pain associated with this. States that it is somewhat like his angina but not nearly as intense. It has resolved.  Inpatient Medications    Scheduled Meds: . aspirin  81 mg Oral Daily  . atorvastatin  80 mg Oral q1800  . carvedilol  3.125 mg Oral BID WC  . digoxin  0.125 mg Oral Daily  . lisinopril  2.5 mg Oral Daily  . morphine   Intravenous Q4H   Continuous Infusions: . albumin human    . dextrose 5 % and 0.45% NaCl 50 mL/hr at 05/19/16 1948   PRN Meds: acetaminophen **OR** acetaminophen, diphenhydrAMINE **OR** diphenhydrAMINE, fentaNYL (SUBLIMAZE) injection, naloxone **AND** sodium chloride flush, nicotine polacrilex, nitroGLYCERIN, ondansetron (ZOFRAN) IV, ondansetron **OR** [DISCONTINUED] ondansetron (ZOFRAN) IV, promethazine   Vital Signs    Vitals:   05/20/16 0243 05/20/16 0316 05/20/16 0532 05/20/16 0800  BP: 116/78  118/79   Pulse: 75  79   Resp: 16 11 15 20   Temp: 97.9 F (36.6 C)  98.3 F (36.8 C)   TempSrc: Oral  Oral   SpO2: 94% 96% 98% 98%  Weight:   165 lb 11.2 oz (75.2 kg)   Height:        Intake/Output Summary (Last 24 hours) at 05/20/16 0936 Last data filed at 05/20/16 0816  Gross per 24 hour  Intake              759 ml  Output              400 ml  Net              359 ml   Filed Weights   05/18/16 0314 05/19/16 0500 05/20/16 0532  Weight: 161 lb 1.6 oz (73.1 kg) 159 lb 8 oz (72.3 kg) 165 lb 11.2 oz (75.2 kg)    Telemetry    Sinus rhythm. Personally reviewed.  ECG    Followup tracing pending.  Physical Exam   GEN: No acute distress. Uncomfortable with abdominal pain. Neck: No JVD. Cardiac: RRR, no gallop. Respiratory: Nonlabored. Decreased breath sounds at the  bases. GI: Mildly distented. MS: No edema; No deformity. Neuro:  Nonfocal. Psych: Alert and oriented x 3. Normal affect.  Labs    Chemistry  Recent Labs Lab 05/18/16 0539 05/19/16 0558 05/20/16 0212  NA 140 136 138  K 3.3* 3.6 3.9  CL 103 101 98*  CO2 26 28 31   GLUCOSE 126* 150* 157*  BUN 20 17 15   CREATININE 1.25* 1.20 1.12  CALCIUM 8.7* 8.1* 8.5*  GFRNONAA >60 >60 >60  GFRAA >60 >60 >60  ANIONGAP 11 7 9      Hematology  Recent Labs Lab 05/18/16 0539 05/19/16 0558 05/20/16 0212  WBC 3.9* 9.1 12.4*  RBC 3.69* 3.45* 3.62*  HGB 10.0* 9.4* 9.9*  HCT 31.7* 29.9* 31.5*  MCV 85.9 86.7 87.0  MCH 27.1 27.2 27.3  MCHC 31.5 31.4 31.4  RDW 17.9* 17.7* 17.4*  PLT 137* 124* 118*    Cardiac Enzymes  Recent Labs Lab 05/20/16 0212  TROPONINI 0.05*   No results for input(s): TROPIPOC in the last 168 hours.    Radiology    Dg Abd 1 View  Result Date: 05/20/2016 CLINICAL DATA:  Pain and distention EXAM: ABDOMEN - 1 VIEW COMPARISON:  CT 05/09/2016, KUB 05/17/2016 FINDINGS: Surgical staples over the midline abdomen. Mild diffuse gaseous dilatation of small and large bowel. Calcified phleboliths in the right pelvis. Decreased: Distension compared to prior radiograph. IMPRESSION: Decreased colonic distention compared to previous. Mild diffuse gaseous dilatation of small and large bowel could be secondary to an ileus. Electronically Signed   By: Donavan Foil M.D.   On: 05/20/2016 01:57   Dg Chest Port 1 View  Result Date: 05/20/2016 CLINICAL DATA:  Chest and abdominal pain, shortness of breath EXAM: PORTABLE CHEST 1 VIEW COMPARISON:  05/17/2017, 12/27/2014 FINDINGS: Left-sided single lead ICD device. Mildly low lung volumes with bibasilar atelectasis. The heart appears borderline enlarged. Slight central vascular congestion. No pneumothorax. No large effusion. IMPRESSION: 1. Low lung volumes with mild bibasilar atelectasis 2. Borderline cardiomegaly with slight central  congestion Electronically Signed   By: Donavan Foil M.D.   On: 05/20/2016 01:54    Cardiac Studies   Echocardiogram 05/17/2016: Study Conclusions  - Left ventricle: The cavity size was mildly dilated. Wall   thickness was normal. Systolic function was severely reduced. The   estimated ejection fraction was in the range of 25% to 30%.   Akinesis of the anteroseptal, anterior, and apical myocardium.   Doppler parameters are consistent with abnormal left ventricular   relaxation (grade 1 diastolic dysfunction).  Patient Profile     56 y.o. male with a history of CAD status post stent interventions to the LAD and circumflex, ischemic cardiomyopathy with LVEF 25-30%, ICD in place, and hyperlipidemia. He underwent a partial colectomy on April 20 due to obstructing distal colonic mass.  Assessment & Plan    1. CAD with prior history of stent placement to the LAD and circumflex. At this point he continues on aspirin, Coreg, Lipitor, Lanoxin, and lisinopril. Plavix was held preoperatively. He did have some chest discomfort last night. Troponin I level is minimally elevated at 0.05 and not suggestive of ACS. Follow-up ECG pending.  2. Post operative day #3 status post partial colectomy for obstructing distal colonic mass.  3. Hyperlipidemia, on Lipitor.  4. Ischemic cardiomyopathy with LVEF 25-30%, ICD in place.  Today with current medications, add back Plavix when safe from a postoperative perspective. Follow-up ECG tomorrow as well. Continue to follow on telemetry.  Signed, Rozann Lesches, MD  05/20/2016, 9:36 AM

## 2016-05-20 NOTE — Progress Notes (Signed)
2 Days Post-Op  Subjective: Chest pain overnight. Pain well controlled. Having nausea this morning, no emesis  Objective: Vital signs in last 24 hours: Temp:  [97.9 F (36.6 C)-98.4 F (36.9 C)] 98.3 F (36.8 C) (04/22 0532) Pulse Rate:  [75-98] 79 (04/22 0532) Resp:  [11-29] 20 (04/22 0800) BP: (102-145)/(48-85) 118/79 (04/22 0532) SpO2:  [93 %-99 %] 98 % (04/22 0800) Weight:  [75.2 kg (165 lb 11.2 oz)] 75.2 kg (165 lb 11.2 oz) (04/22 0532) Last BM Date: 05/17/16  Intake/Output from previous day: 04/21 0701 - 04/22 0700 In: 684 [P.O.:120; I.V.:564] Out: 400 [Urine:400] Intake/Output this shift: Total I/O In: 75 [I.V.:75] Out: -   General appearance: alert and cooperative Resp: clear to auscultation bilaterally GI: soft, minimally distended, appropriately tender. LLQ colostomy pink and edematous with some liquid stool in bag Incision/Wound: c/d/I under honeycomb, no s/s of infection  Lab Results:   Recent Labs  05/19/16 0558 05/20/16 0212  WBC 9.1 12.4*  HGB 9.4* 9.9*  HCT 29.9* 31.5*  PLT 124* 118*   BMET  Recent Labs  05/19/16 0558 05/20/16 0212  NA 136 138  K 3.6 3.9  CL 101 98*  CO2 28 31  GLUCOSE 150* 157*  BUN 17 15  CREATININE 1.20 1.12  CALCIUM 8.1* 8.5*   PT/INR No results for input(s): LABPROT, INR in the last 72 hours. ABG No results for input(s): PHART, HCO3 in the last 72 hours.  Invalid input(s): PCO2, PO2  Studies/Results: Dg Abd 1 View  Result Date: 05/20/2016 CLINICAL DATA:  Pain and distention EXAM: ABDOMEN - 1 VIEW COMPARISON:  CT 05/09/2016, KUB 05/17/2016 FINDINGS: Surgical staples over the midline abdomen. Mild diffuse gaseous dilatation of small and large bowel. Calcified phleboliths in the right pelvis. Decreased: Distension compared to prior radiograph. IMPRESSION: Decreased colonic distention compared to previous. Mild diffuse gaseous dilatation of small and large bowel could be secondary to an ileus. Electronically Signed    By: Donavan Foil M.D.   On: 05/20/2016 01:57   Dg Chest Port 1 View  Result Date: 05/20/2016 CLINICAL DATA:  Chest and abdominal pain, shortness of breath EXAM: PORTABLE CHEST 1 VIEW COMPARISON:  05/17/2017, 12/27/2014 FINDINGS: Left-sided single lead ICD device. Mildly low lung volumes with bibasilar atelectasis. The heart appears borderline enlarged. Slight central vascular congestion. No pneumothorax. No large effusion. IMPRESSION: 1. Low lung volumes with mild bibasilar atelectasis 2. Borderline cardiomegaly with slight central congestion Electronically Signed   By: Donavan Foil M.D.   On: 05/20/2016 01:54    Anti-infectives: Anti-infectives    Start     Dose/Rate Route Frequency Ordered Stop   05/18/16 0900  cefoTEtan (CEFOTAN) 2 g in dextrose 5 % 50 mL IVPB     2 g 100 mL/hr over 30 Minutes Intravenous To [facility] 05/18/16 0826 05/18/16 1035   05/18/16 0827  cefoTEtan in Dextrose 5% (CEFOTAN) 2-2.08 GM-% IVPB    Comments:  Laurita Quint   : cabinet override      05/18/16 0827 05/18/16 2029      Assessment/Plan: s/p Procedure(s): COLECTOMY WITH OSTOMY CREATION/HARTMANN PROCEDURE (Left) 05/18/16 Dr [name] Post-op ileus- NPO except meds and ice chips for now. Await more ostomy function PT/ mobilize Pulmonary toilet- needs aggressive help with this.  Chest pain, mildly elevated troponins- cards following, low concern for acs at this time per their notes  LOS: 4 days    Christopher [name] 05/20/2016

## 2016-05-21 DIAGNOSIS — C801 Malignant (primary) neoplasm, unspecified: Secondary | ICD-10-CM

## 2016-05-21 LAB — BASIC METABOLIC PANEL
Anion gap: 3 — ABNORMAL LOW (ref 5–15)
BUN: 24 mg/dL — AB (ref 6–20)
CHLORIDE: 105 mmol/L (ref 101–111)
CO2: 30 mmol/L (ref 22–32)
Calcium: 8.2 mg/dL — ABNORMAL LOW (ref 8.9–10.3)
Creatinine, Ser: 0.98 mg/dL (ref 0.61–1.24)
GFR calc Af Amer: >60 mL/min (ref >60)
GFR calc non Af Amer: >60 mL/min (ref >60)
GLUCOSE: 128 mg/dL — AB (ref 65–99)
POTASSIUM: 4.1 mmol/L (ref 3.5–5.1)
Sodium: 138 mmol/L (ref 135–145)

## 2016-05-21 LAB — CBC
HEMATOCRIT: 26.7 % — AB (ref 39.0–52.0)
HEMOGLOBIN: 8.3 g/dL — AB (ref 13.0–17.0)
MCH: 27.2 pg (ref 26.0–34.0)
MCHC: 31.1 g/dL (ref 30.0–36.0)
MCV: 87.5 fL (ref 78.0–100.0)
Platelets: 131 10*3/uL — ABNORMAL LOW (ref 150–400)
RBC: 3.05 MIL/uL — ABNORMAL LOW (ref 4.22–5.81)
RDW: 17.4 % — ABNORMAL HIGH (ref 11.5–15.5)
WBC: 13.1 10*3/uL — AB (ref 4.0–10.5)

## 2016-05-21 MED ORDER — OXYCODONE HCL 5 MG PO TABS
5.0000 mg | ORAL_TABLET | ORAL | Status: DC | PRN
  Administered 2016-05-23: 5 mg via ORAL
  Administered 2016-05-23: 10 mg via ORAL
  Filled 2016-05-21 (×2): qty 2
  Filled 2016-05-21: qty 1

## 2016-05-21 MED ORDER — MORPHINE SULFATE (PF) 2 MG/ML IV SOLN
2.0000 mg | INTRAVENOUS | Status: DC | PRN
  Filled 2016-05-21: qty 1

## 2016-05-21 MED ORDER — PANTOPRAZOLE SODIUM 40 MG IV SOLR
40.0000 mg | Freq: Two times a day (BID) | INTRAVENOUS | Status: DC
  Administered 2016-05-21 – 2016-05-23 (×5): 40 mg via INTRAVENOUS
  Filled 2016-05-21 (×5): qty 40

## 2016-05-21 MED ORDER — ACETAMINOPHEN 325 MG PO TABS
650.0000 mg | ORAL_TABLET | Freq: Three times a day (TID) | ORAL | Status: DC
  Administered 2016-05-21 – 2016-05-25 (×7): 650 mg via ORAL
  Filled 2016-05-21 (×12): qty 2

## 2016-05-21 NOTE — Consult Note (Addendum)
[place] Nurse ostomy consult note Surgical team following for assessment and plan of care. Stoma type/location: Colostomy surgery was performed on 4/20 to LLQ.  Attempted to perform ostomy teaching session and pouch change but pt is very anxious regarding his abd wound.  He keeps saying the staples are coming out, although they appear to be dry and well-approximated.  He wants the bedside nurse and the PA to assess his abd wound.  When demonstrating the pouch appearance and discussing pouching routines, he was not able to pay attention and kept asking questions about his wound.  Will attempt to perform teaching session tomorrow.   Current pouch is intact with good seal, small amt black liquid stool.   Ostomy pouching: 1pc. Extra supplies left at the bedside.  Enrolled patient in [place] program: No Julien Girt MSN, [place], [place], [place], [place]

## 2016-05-21 NOTE — Clinical Social Work Note (Signed)
Clinical Social Work Assessment  Patient Details  Name: Christopher [name] MRN: [id number] Date of Birth: 04/27/1960  Date of referral:  05/21/16               Reason for consult:  Facility Placement                Permission sought to share information with:    Permission granted to share information::     Name::        Agency::     Relationship::     Contact Information:     Housing/Transportation Living arrangements for the past 2 months:  Apartment Source of Information:  Patient Patient Interpreter Needed:  None Criminal Activity/Legal Involvement Pertinent to Current Situation/Hospitalization:  No - Comment as needed Significant Relationships:  Siblings Lives with:  Self Do you feel safe going back to the place where you live?    Need for family participation in patient care:     Care giving concerns:  No family or friends present at bedside during initial assessment.   Social Worker assessment / plan:  CSW spoke with pt at bedside to complete initial assessment. Pt lives in a apatrment on the second story. Pt understanding need for SNF at d/c and is agreeable at this time. Pt has some concern his brother is trying to take his stuff as he is apartment sitting for pt. Pt agreeable to [facility] f/o. CSW will provide pt with bed offers once available.  Employment status:  Disabled (Comment on whether or not currently receiving Disability) Insurance information:  Medicare PT Recommendations:  [name] / Referral to community resources:  [place]  Patient/Family's Response to care:  Pt verbalized understanding of CSW role and expressed appreciation for support. Pt denies any concern regarding pt care at this time.  Patient/Family's Understanding of and Emotional Response to Diagnosis, Current Treatment, and Prognosis:  Pt understanding and realistic regarding physical limitations. Pt understands the need for SNF placement at d/c. Pt  agreeable to SNF placement at d/c, at this time. Pt's responses emotionally appropriate during conversation with CSW. Pt denies any concern regarding treatment plan at this time. CSW will continue to provide support and facilitate d/c needs.   Emotional Assessment Appearance:  Appears older than stated age Attitude/Demeanor/Rapport:   (Patient was appropriate.) Affect (typically observed):  Accepting, Appropriate, Anxious Orientation:  Oriented to Self, Oriented to Place, Oriented to  Time, Oriented to Situation Alcohol / Substance use:  Not Applicable Psych involvement (Current and /or in the community):  No (Comment)  Discharge Needs  Concerns to be addressed:  Care Coordination Readmission within the last 30 days:  No Current discharge risk:  Dependent with Mobility Barriers to Discharge:  Continued Medical Work up   [employer], LCSW 05/21/2016, 4:21 PM

## 2016-05-21 NOTE — Progress Notes (Signed)
[facility] Progress Note  3 Days Post-Op  Subjective: CC: colon mass s/p partial colectomy/colostomy Pt complains of pain with movement but pain is controlled at rest. Denies nausea/vomiting. Denies dysuria. Per nursing staff he is not getting out of bed much. He pulled 1250 cc on IS.   Objective: Vital signs in last 24 hours: Temp:  [97.8 F (36.6 C)-98.4 F (36.9 C)] 98.4 F (36.9 C) (04/23 8185) Pulse Rate:  [81-89] 87 (04/23 0611) Resp:  [11-21] 19 (04/23 0758) BP: (108-115)/(70-77) 108/75 (04/23 0611) SpO2:  [93 %-100 %] 94 % (04/23 0758) Weight:  [75.1 kg (165 lb 9.6 oz)] 75.1 kg (165 lb 9.6 oz) (04/23 0611) Last BM Date: 05/17/16  Intake/Output from previous day: 04/22 0701 - 04/23 0700 In: 1672 [P.O.:180; I.V.:1492] Out: 975 [Urine:925; Stool:50] Intake/Output this shift: No intake/output data recorded.  PE: Gen:  Alert, NAD, cooperative Card:  Regular rate and rhythm Pulm:  Non-labored, clear to auscultation bilaterally with diminished breath sounds in bilateral lung bases Abd: Soft, appropriately tender, mild distention, bowel sounds present, colostomy pouch with gas and stool. Midline incision c/d/I - dressing removed secondary to contamination with stool. Incision is clean and dry with no erythema or drainage. Ext:  No erythema, edema, or tenderness  Lab Results:   Recent Labs  05/20/16 0212 05/21/16 0330  WBC 12.4* 13.1*  HGB 9.9* 8.3*  HCT 31.5* 26.7*  PLT 118* 131*   BMET  Recent Labs  05/20/16 0212 05/21/16 0330  NA 138 138  K 3.9 4.1  CL 98* 105  CO2 31 30  GLUCOSE 157* 128*  BUN 15 24*  CREATININE 1.12 0.98  CALCIUM 8.5* 8.2*   PT/INR No results for input(s): LABPROT, INR in the last 72 hours. CMP     Component Value Date/Time   NA 138 05/21/2016 0330   K 4.1 05/21/2016 0330   CL 105 05/21/2016 0330   CO2 30 05/21/2016 0330   GLUCOSE 128 (H) 05/21/2016 0330   BUN 24 (H) 05/21/2016 0330   CREATININE 0.98 05/21/2016  0330   CREATININE 1.13 02/09/2015 0942   CALCIUM 8.2 (L) 05/21/2016 0330   PROT 5.8 (L) 05/09/2016 1700   ALBUMIN 3.4 (L) 05/09/2016 1700   AST 21 05/09/2016 1700   ALT 12 (L) 05/09/2016 1700   ALKPHOS 56 05/09/2016 1700   BILITOT 0.6 05/09/2016 1700   GFRNONAA >60 05/21/2016 0330   GFRAA >60 05/21/2016 0330   Lipase  No results found for: LIPASE     Studies/Results: Dg Abd 1 View  Result Date: 05/20/2016 CLINICAL DATA:  Pain and distention EXAM: ABDOMEN - 1 VIEW COMPARISON:  CT 05/09/2016, KUB 05/17/2016 FINDINGS: Surgical staples over the midline abdomen. Mild diffuse gaseous dilatation of small and large bowel. Calcified phleboliths in the right pelvis. Decreased: Distension compared to prior radiograph. IMPRESSION: Decreased colonic distention compared to previous. Mild diffuse gaseous dilatation of small and large bowel could be secondary to an ileus. Electronically Signed   By: Donavan Foil M.D.   On: 05/20/2016 01:57   Dg Chest Port 1 View  Result Date: 05/20/2016 CLINICAL DATA:  Chest and abdominal pain, shortness of breath EXAM: PORTABLE CHEST 1 VIEW COMPARISON:  05/17/2017, 12/27/2014 FINDINGS: Left-sided single lead ICD device. Mildly low lung volumes with bibasilar atelectasis. The heart appears borderline enlarged. Slight central vascular congestion. No pneumothorax. No large effusion. IMPRESSION: 1. Low lung volumes with mild bibasilar atelectasis 2. Borderline cardiomegaly with slight central congestion Electronically Signed   By:  Donavan Foil M.D.   On: 05/20/2016 01:54    Anti-infectives: Anti-infectives    Start     Dose/Rate Route Frequency Ordered Stop   05/18/16 0900  cefoTEtan (CEFOTAN) 2 g in dextrose 5 % 50 mL IVPB     2 g 100 mL/hr over 30 Minutes Intravenous To [facility]Stay Surgical 05/18/16 0826 05/18/16 1035   05/18/16 0827  cefoTEtan in Dextrose 5% (CEFOTAN) 2-2.08 GM-% IVPB    Comments:  Laurita Quint   : cabinet override      05/18/16 0827 05/18/16  2029     Assessment/Plan s/p Procedure(s): COLECTOMY WITH OSTOMY CREATION/HARTMANN PROCEDURE (Left) 05/18/16 Dr [name] -  Path pending -  Post-op ileus- resolving, now with gas and stool in pouch -  Advance diet as tolerated to heart healthy -  PT/ mobilize  -  Pulmonary toilet- needs aggressive help with this!  -  hgb 8.3 today, re-check CBC in AM and if stable may re-start plavix tomorrow from surgical standpoint  - pain: d/c PCA, start PO pain meds with IV for breakthrough   Leukocytosis - 13.1, from 12.4. Atelectasis on CXR 4/23, low suspicion for intra-abdominal infection at this point. CBC in AM. Chest pain - per cards, non-specific trop elevations with low suspicion for ACS    LOS: 5 days    Jill Alexanders , [facility] 05/21/2016, 10:18 AM Pager: [phone] Consults: [phone] Mon-Fri 7:00 am-4:30 pm Sat-Sun 7:00 am-11:30 am

## 2016-05-21 NOTE — Progress Notes (Signed)
Subjective:  Anxious.  No c/o dyspnea and no recurrent chest pain.  Passing some gas and stool through bag but still c/o  cloating  Objective:  Vital Signs in the last 24 hours: BP 108/75 (BP Location: Right Arm)   Pulse 87   Temp 98.4 F (36.9 C) (Oral)   Resp 19   Ht 5\' 8"  (1.727 m)   Wt 75.1 kg (165 lb 9.6 oz)   SpO2 94%   BMI 25.18 kg/m   Physical Exam: Anxious Wm in NAD Lungs:  Clear Cardiac:  Regular rhythm, normal S1 and S2, no S3 Abdomen:  Ostomy present, mildly distended Extremities:  No edema present  Intake/Output from previous day: 04/22 0701 - 04/23 0700 In: 1672 [P.O.:180; I.V.:1492] Out: 975 [Urine:925; Stool:50]  Weight Filed Weights   05/19/16 0500 05/20/16 0532 05/21/16 0611  Weight: 72.3 kg (159 lb 8 oz) 75.2 kg (165 lb 11.2 oz) 75.1 kg (165 lb 9.6 oz)    Lab Results: Basic Metabolic Panel:  Recent Labs  05/20/16 0212 05/21/16 0330  NA 138 138  K 3.9 4.1  CL 98* 105  CO2 31 30  GLUCOSE 157* 128*  BUN 15 24*  CREATININE 1.12 0.98   CBC:  Recent Labs  05/20/16 0212 05/21/16 0330  WBC 12.4* 13.1*  HGB 9.9* 8.3*  HCT 31.5* 26.7*  MCV 87.0 87.5  PLT 118* 131*   Cardiac Panel (last 3 results)  Recent Labs  05/20/16 0212 05/20/16 0902 05/20/16 1607  TROPONINI 0.05* 0.06* 0.06*    Telemetry: Sinus with one slow run of VT noted  Assessment/Plan:  1. Ischemic cardiomyopathy stable 2. CAD with prior stent 3. Anemia due to blood loss 4. Recent surgery for obstruction  Recommendation:  Restart Plavix if OK with surgery.  EKG reviewed by me shows IVCD, RAD, no ischemic changes.  Trivial Troponin elevation that is nonspecific.      Kerry Hough  MD [facility]  05/21/2016, 8:56 AM

## 2016-05-21 NOTE — Clinical Social Work Placement (Signed)
   CLINICAL SOCIAL WORK PLACEMENT  NOTE  Date:  05/21/2016  Patient Details  Name: Christopher [name] MRN: [id number] Date of Birth: 04-May-1960  Clinical Social Work is seeking post-discharge placement for this patient at the [place] level of care (*CSW will initial, date and re-position this form in  chart as items are completed):      Patient/family provided with [place] Work Department's list of facilities offering this level of care within the geographic area requested by the patient (or if unable, by the patient's family).  Yes   Patient/family informed of their freedom to choose among providers that offer the needed level of care, that participate in Medicare, Medicaid or managed care program needed by the patient, have an available bed and are willing to accept the patient.      Patient/family informed of [facility]'s ownership interest in [facility] and [facility], as well as of the fact that they are under no obligation to receive care at these facilities.  PASRR submitted to EDS on       PASRR number received on 05/21/16     Existing PASRR number confirmed on       FL2 transmitted to all facilities in geographic area requested by pt/family on 05/21/16     FL2 transmitted to all facilities within larger geographic area on 05/21/16     Patient informed that his/her managed care company has contracts with or will negotiate with certain facilities, including the following:        Yes   Patient/family informed of bed offers received.  Patient chooses bed at       Physician recommends and patient chooses bed at      Patient to be transferred to   on  .  Patient to be transferred to facility by       Patient family notified on   of transfer.  Name of family member notified:        PHYSICIAN Please prepare priority discharge summary, including medications, Please prepare prescriptions     Additional Comment:     _______________________________________________ Eileen Stanford, LCSW 05/21/2016, 4:24 PM

## 2016-05-21 NOTE — NC FL2 (Signed)
[place] LEVEL OF CARE SCREENING TOOL     IDENTIFICATION  Patient Name: Christopher [name] Birthdate: [birth year] Sex: male Admission Date (Current Location): 05/16/2016  [facility] and [place] Number:  Herbalist and Address:  [place]. [facility], [place] [address], [place], [place] [address]      Provider Number: [id number]  Attending Physician Name and Address:  Bonnielee Haff, MD  Relative Name and Phone Number:       Current Level of Care: Hospital Recommended Level of Care: [place] Prior Approval Number:    Date Approved/Denied:   PASRR Number: [id number]  Discharge Plan: SNF    Current Diagnoses: Patient Active Problem List   Diagnosis Date Noted  . Partial bowel obstruction ([place]) 05/16/2016  . Essential hypertension 05/16/2016  . AKI (acute kidney injury) ([place]) 05/16/2016  . Colonic mass 05/11/2016  . Preoperative cardiovascular examination 05/11/2016  . Anemia due to blood loss 05/03/2016  . Melena 05/03/2016  . Anxiety state 01/17/2015  . Cardiomyopathy, ischemic   . Thrombocytopenia ([place]) 12/27/2014  . AICD (automatic cardioverter/defibrillator) present   . Financial difficulties 10/01/2013  . CAD (coronary artery disease), native coronary artery 07/03/2013  . Tobacco use 07/03/2013  . Chronic systolic CHF (congestive heart failure) ([place])   . Hyperlipidemia   . Old anterior myocardial infarction 06/20/2013    Orientation RESPIRATION BLADDER Height & Weight     Self, Time, Situation, Place  O2 (Nasal Cannula, 2L) Continent Weight: 165 lb 9.6 oz (75.1 kg) Height:  5\' 8"  (172.7 cm)  BEHAVIORAL SYMPTOMS/MOOD NEUROLOGICAL BOWEL NUTRITION STATUS      Incontinent, Colostomy (Placed 4/20)  (Please see d/c summary)  AMBULATORY STATUS COMMUNICATION OF NEEDS Skin   Limited Assist Verbally Surgical wounds (Closed incision on chest, honeycomb dressing)                       Personal Care Assistance  Level of Assistance  Bathing, Feeding, Dressing Bathing Assistance: Limited assistance Feeding assistance: Independent Dressing Assistance: Limited assistance     Functional Limitations Info  Sight, Hearing, Speech Sight Info: Adequate Hearing Info: Adequate Speech Info: Adequate    SPECIAL CARE FACTORS FREQUENCY  PT (By licensed PT), OT (By licensed OT)     PT Frequency: 3x OT Frequency: 3x            Contractures Contractures Info: Not present    Additional Factors Info  Code Status, Allergies Code Status Info: Full Code Allergies Info: Celebrex Celecoxib, Contrast Media Iodinated Diagnostic Agents           Current Medications (05/21/2016):  This is the current hospital active medication list Current Facility-Administered Medications  Medication Dose Route Frequency Provider Last Rate Last Dose  . acetaminophen (TYLENOL) tablet 650 mg  650 mg Oral Q6H PRN Waldemar Dickens, MD       Or  . acetaminophen (TYLENOL) suppository 650 mg  650 mg Rectal Q6H PRN Waldemar Dickens, MD      . aspirin chewable tablet 81 mg  81 mg Oral Daily Waldemar Dickens, MD   81 mg at 05/20/16 1021  . atorvastatin (LIPITOR) tablet 80 mg  80 mg Oral q1800 Waldemar Dickens, MD   80 mg at 05/20/16 1728  . carvedilol (COREG) tablet 3.125 mg  3.125 mg Oral BID WC Bonnielee Haff, MD   3.125 mg at 05/21/16 0754  . clopidogrel (PLAVIX) tablet 75 mg  75 mg  Oral Daily Bonnielee Haff, MD   75 mg at 05/20/16 1336  . dextrose 5 %-0.45 % sodium chloride infusion   Intravenous Continuous Bonnielee Haff, MD 50 mL/hr at 05/21/16 0233    . digoxin (LANOXIN) tablet 0.125 mg  0.125 mg Oral Daily Waldemar Dickens, MD   0.125 mg at 05/20/16 1021  . diphenhydrAMINE (BENADRYL) injection 12.5 mg  12.5 mg Intravenous Q6H PRN [name], MD       Or  . diphenhydrAMINE (BENADRYL) 12.5 MG/5ML elixir 12.5 mg  12.5 mg Oral Q6H PRN [name], MD      . fentaNYL (SUBLIMAZE) injection 25-50 mcg  25-50 mcg Intravenous Q2H  PRN Waldemar Dickens, MD   50 mcg at 05/18/16 0547  . lisinopril (PRINIVIL,ZESTRIL) tablet 2.5 mg  2.5 mg Oral Daily Waldemar Dickens, MD   2.5 mg at 05/20/16 1021  . morphine (MORPHINE) 2 mg/mL PCA injection   Intravenous Q4H [name], MD      . naloxone [facility]) injection 0.4 mg  0.4 mg Intravenous PRN [name], MD       And  . sodium chloride flush (NS) 0.9 % injection 9 mL  9 mL Intravenous PRN [name], MD      . nicotine polacrilex (NICORETTE) gum 2 mg  2 mg Oral PRN Waldemar Dickens, MD      . nitroGLYCERIN (NITROSTAT) SL tablet 0.4 mg  0.4 mg Sublingual Q5 min PRN Reubin Milan, MD   0.4 mg at 05/20/16 0131  . ondansetron (ZOFRAN) injection 4 mg  4 mg Intravenous Q4H PRN Clovis Riley, MD      . ondansetron [facility]) tablet 4 mg  4 mg Oral Q6H PRN Waldemar Dickens, MD      . promethazine [facility]) injection 12.5 mg  12.5 mg Intravenous Q4H PRN Reubin Milan, MD   12.5 mg at 05/20/16 0123     Discharge Medications: Please see discharge summary for a list of discharge medications.  Relevant Imaging Results:  Relevant Lab Results:   Additional Information SSN: [id number]  Eileen Stanford, LCSW

## 2016-05-21 NOTE — Progress Notes (Signed)
Observed patient picking at staples on abdominal incision.  Covered incision with dry ABD pad and tape.  Patient says it feels better.

## 2016-05-21 NOTE — Progress Notes (Signed)
Physical Therapy Treatment Patient Details Name: Christopher [name] MRN: [id number] DOB: 1960-12-05 Today's Date: 05/21/2016    History of Present Illness Pt is a 56 y/o male presenting to ED on 4/18 with bowel pain and found to have a mass concerning for malignancy. Pt is now s/p L colectomy with colostomy creation in L UQ on 4/20. PMH including but not limited to CAD s/p MI in 2015, AICD placement in 2015, CHF, CKD, and COPD.    PT Comments    Pt is making good progress towards his goals. Pt is supervision for bed mobility and minAx1 for transfers to/from RW and ambulation of 250 feet with RW. Pt requires skilled PT to continue to progress ambulation training and to improve LE strength and endurance to safely navigate his discharge environment.     Follow Up Recommendations  SNF;Supervision/Assistance - 24 hour     Equipment Recommendations  Rolling walker with 5" wheels;3in1 (PT)    Recommendations for Other Services       Precautions / Restrictions Precautions Precautions: Fall Restrictions Weight Bearing Restrictions: No    Mobility  Bed Mobility Overal bed mobility: Needs Assistance Bed Mobility: Supine to Sit;Sit to Supine     Supine to sit: Supervision Sit to supine: Supervision   General bed mobility comments: increased time and effort, use of bed rails, supervision for safety to achieve sitting EOB  Transfers Overall transfer level: Needs assistance Equipment used: Rolling walker (2 wheeled) Transfers: Sit to/from Stand Sit to Stand: Min assist         General transfer comment: increased time, min A for stability with transition into standing from bed  Ambulation/Gait Ambulation/Gait assistance: Min assist Ambulation Distance (Feet): 250 Feet Assistive device: Rolling walker (2 wheeled) Gait Pattern/deviations: Step-through pattern;Decreased stride length;Shuffle Gait velocity: decreased Gait velocity interpretation: Below normal speed for  age/gender General Gait Details: pt able to ambulate with slow steady cadence, vc for management of RW and staying inside walker   Stairs            Wheelchair Mobility    Modified Rankin (Stroke Patients Only)       Balance Overall balance assessment: Needs assistance Sitting-balance support: No upper extremity supported Sitting balance-Leahy Scale: Fair     Standing balance support: During functional activity;Single extremity supported Standing balance-Leahy Scale: Fair Standing balance comment: pt requires RW for UE support to maintain balance                            Cognition Arousal/Alertness: Awake/alert Behavior During Therapy: Anxious Overall Cognitive Status: No family/caregiver present to determine baseline cognitive functioning Area of Impairment: Attention;Problem solving                   Current Attention Level: Focused         Problem Solving: Difficulty sequencing;Requires verbal cues General Comments: pt anxious about brother being given power of attorney             Pertinent Vitals/Pain Pain Assessment: Faces Faces Pain Scale: Hurts little more Pain Location: abdomen Pain Descriptors / Indicators: Sore;Grimacing;Guarding Pain Intervention(s): Limited activity within patient's tolerance;Monitored during session  HR 110 bpm prior to activity, HR 111 bpm post activity  SaO2 on RA >95% throughout session           PT Goals (current goals can now be found in the care plan section) Acute Rehab PT Goals Patient Stated Goal: to get  some sleep PT Goal Formulation: With patient Time For Goal Achievement: 06/02/16 Potential to Achieve Goals: Good Progress towards PT goals: Progressing toward goals    Frequency    Min 3X/week      PT Plan Current plan remains appropriate       End of Session Equipment Utilized During Treatment: Gait belt (placed high on chest) Activity Tolerance: Patient limited by  fatigue;Patient limited by pain Patient left: in bed;with call bell/phone within reach;with nursing/sitter in room Nurse Communication: Mobility status PT Visit Diagnosis: Unsteadiness on feet (R26.81);Other abnormalities of gait and mobility (R26.89);Pain Pain - part of body:  (abdomen)     Time: 8003-4917 PT Time Calculation (min) (ACUTE ONLY): 22 min  Charges:  $Gait Training: 8-22 mins                    G Codes:       Yeudiel Mateo B. Migdalia Dk PT, DPT Acute Rehabilitation  [phone] Pager [phone]     [place] 05/21/2016, 5:12 PM

## 2016-05-21 NOTE — Progress Notes (Addendum)
Pt. Had 5 beat run of Vtach. Asymptomatic. MD text paged.

## 2016-05-21 NOTE — Progress Notes (Signed)
[facility] PROGRESS NOTE  Christopher [name] YQM:[id number] DOB: [birth year] DOA: 05/16/2016  PCP: Minerva Ends, MD  Brief History/Interval Summary: 56 year old Caucasian male with medical history significant of CAD/MI status post cardiac catheterization with stent placement and AICD, CHF, C KD, COPD, hyperlipidemia, tobacco use , presented with complaints of abdominal pain and distention. He underwent colonoscopy and was found to have an obstructing mass in the distal descending colon. [name] was hospitalized for further management.  Reason for Visit: Obstructing mass in the distal descending colon  Consultants: Gastroenterology. General surgery. Cardiology.  Procedures:   Transthoracic echocardiogram Study Conclusions - Left ventricle: The cavity size was mildly dilated. Wall   thickness was normal. Systolic function was severely reduced. The   estimated ejection fraction was in the range of 25% to 30%.   Akinesis of the anteroseptal, anterior, and apical myocardium.   Doppler parameters are consistent with abnormal left ventricular   relaxation (grade 1 diastolic dysfunction).  Partial colectomy, colostomy 4/20  Antibiotics: None  Subjective/Interval History: Patient is a very poor historian. She is anxious. Denies any significant pain or discomfort. No nausea, vomiting. Has not had any further episodes of chest pain.   ROS: Denies any headaches.  Objective:  Vital Signs  Vitals:   05/21/16 0416 05/21/16 0611 05/21/16 0756 05/21/16 0758  BP:  108/75    Pulse:  87    Resp: 17 17 19 19   Temp:  98.4 F (36.9 C)    TempSrc:  Oral    SpO2: 97% 97% 94% 94%  Weight:  75.1 kg (165 lb 9.6 oz)    Height:        Intake/Output Summary (Last 24 hours) at 05/21/16 0841 Last data filed at 05/21/16 0612  Gross per 24 hour  Intake          1597.04 ml  Output              975 ml  Net           622.04 ml   Filed Weights   05/19/16 0500 05/20/16 0532 05/21/16  0611  Weight: 72.3 kg (159 lb 8 oz) 75.2 kg (165 lb 11.2 oz) 75.1 kg (165 lb 9.6 oz)    General appearance: Awake and alert. In no distress. Resp: Somewhat diminished entry at the bases. Mostly clear to auscultation. No wheezing, rales or rhonchi.  Cardio: S1, S2 is normal, regular. No S3, S4. No rubs, murmurs, or bruit. No pedal edema.  GI: Abdomen is soft. Some tenderness present over the incision site. Bowel sounds are present. Stool is noted in the ostomy. Extremities: No edema Neurologic: No focal deficits  Lab Results:  Data Reviewed: I have personally reviewed following labs and imaging studies  CBC:  Recent Labs Lab 05/17/16 0350 05/18/16 0539 05/19/16 0558 05/20/16 0212 05/21/16 0330  WBC 10.9* 3.9* 9.1 12.4* 13.1*  HGB 10.2* 10.0* 9.4* 9.9* 8.3*  HCT 31.0* 31.7* 29.9* 31.5* 26.7*  MCV 86.1 85.9 86.7 87.0 87.5  PLT 150 137* 124* 118* 131*    Basic Metabolic Panel:  Recent Labs Lab 05/17/16 0350 05/18/16 0539 05/19/16 0558 05/20/16 0212 05/21/16 0330  NA 139 140 136 138 138  K 3.4* 3.3* 3.6 3.9 4.1  CL 103 103 101 98* 105  CO2 23 26 28 31 30   GLUCOSE 108* 126* 150* 157* 128*  BUN 18 20 17 15  24*  CREATININE 1.23 1.25* 1.20 1.12 0.98  CALCIUM 8.6* 8.7* 8.1* 8.5* 8.2*  GFR: Estimated Creatinine Clearance: 84.3 mL/min (by C-G formula based on SCr of 0.98 mg/dL).   Radiology Studies: Dg Abd 1 View  Result Date: 05/20/2016 CLINICAL DATA:  Pain and distention EXAM: ABDOMEN - 1 VIEW COMPARISON:  CT 05/09/2016, KUB 05/17/2016 FINDINGS: Surgical staples over the midline abdomen. Mild diffuse gaseous dilatation of small and large bowel. Calcified phleboliths in the right pelvis. Decreased: Distension compared to prior radiograph. IMPRESSION: Decreased colonic distention compared to previous. Mild diffuse gaseous dilatation of small and large bowel could be secondary to an ileus. Electronically Signed   By: Donavan Foil M.D.   On: 05/20/2016 01:57   Dg Chest  Port 1 View  Result Date: 05/20/2016 CLINICAL DATA:  Chest and abdominal pain, shortness of breath EXAM: PORTABLE CHEST 1 VIEW COMPARISON:  05/17/2017, 12/27/2014 FINDINGS: Left-sided single lead ICD device. Mildly low lung volumes with bibasilar atelectasis. The heart appears borderline enlarged. Slight central vascular congestion. No pneumothorax. No large effusion. IMPRESSION: 1. Low lung volumes with mild bibasilar atelectasis 2. Borderline cardiomegaly with slight central congestion Electronically Signed   By: Donavan Foil M.D.   On: 05/20/2016 01:54     Medications:  Scheduled: . aspirin  81 mg Oral Daily  . atorvastatin  80 mg Oral q1800  . carvedilol  3.125 mg Oral BID WC  . clopidogrel  75 mg Oral Daily  . digoxin  0.125 mg Oral Daily  . lisinopril  2.5 mg Oral Daily  . morphine   Intravenous Q4H   Continuous: . dextrose 5 % and 0.45% NaCl 50 mL/hr at 05/21/16 5176   HYW:VPXTGGYIRSWNI **OR** acetaminophen, diphenhydrAMINE **OR** diphenhydrAMINE, fentaNYL (SUBLIMAZE) injection, naloxone **AND** sodium chloride flush, nicotine polacrilex, nitroGLYCERIN, ondansetron (ZOFRAN) IV, ondansetron **OR** [DISCONTINUED] ondansetron (ZOFRAN) IV, promethazine  Assessment/Plan:  Principal Problem:   Partial bowel obstruction (HCC) Active Problems:   Chronic systolic CHF (congestive heart failure) (HCC)   Hyperlipidemia   AICD (automatic cardioverter/defibrillator) present   Anemia due to blood loss   Melena   Colonic mass   Essential hypertension   AKI (acute kidney injury) ([place])    Obstructing cancer in the distal descending colon causing Partial bowel obstruction Pathology shows adenocarcinoma. This mass was partially obstructing the colon. CT chest did not show any metastatic lesions. Patient seen by general surgery. Patient underwent partial colectomy and colostomy placement on 4/20. General surgery is following.   Chest Pain Patient developed chest pain Overnight on 4/22.  Could be related to gas/GI in origin. EKG did show T inversion in lead 3 and aVF, but this has been seen previously as well. EKG from this morning shows similar findings. Troponin only minimally elevated. Cardiology is following.   History of coronary artery disease/chronic systolic CHF, status post AICD Last cath w/ stent placement 11/2015. Currently well compensated and w/o ACS symptoms. Dr. Wynonia Lawman is following. Echocardiogram shows EF of 25-30%. Continue ASA, lipitor. Continue coreg and digoxin, lisinopril. Monitor fluid status closely in the postoperative period. Plavix was resumed 4/22 after discussions with Dr. Windle Guard.  Normocytic anemia Hemoglobin noted to be lower today compared to yesterday. No evidence for overt bleeding, although that is blackish-appearing stool noted in the ostomy pouch. Continue to monitor for now. If hemoglobin continues to drop, we may have to stop Plavix. Start PPI.  Mild AKI Continue to monitor urine output. Creatinine is stable.  Tobacco use Nicotine PRN.  DVT Prophylaxis: SCDs Code Status: Full code  Family Communication: Discussed with patient  Disposition Plan: Management as outlined above.  LOS: 5 days   [place] [facility] Pager [phone] 05/21/2016, 8:41 AM  If 7PM-7AM, please contact night-coverage at [email or web address], password [facility]

## 2016-05-22 LAB — TYPE AND SCREEN
ABO/RH(D): A POS
Antibody Screen: NEGATIVE
UNIT DIVISION: 0
Unit division: 0

## 2016-05-22 LAB — BPAM RBC
BLOOD PRODUCT EXPIRATION DATE: 201805042359
BLOOD PRODUCT EXPIRATION DATE: 201805072359
ISSUE DATE / TIME: 201804161049
UNIT TYPE AND RH: 6200
UNIT TYPE AND RH: 6200

## 2016-05-22 LAB — CBC
HEMATOCRIT: 22.8 % — AB (ref 39.0–52.0)
HEMOGLOBIN: 7.3 g/dL — AB (ref 13.0–17.0)
MCH: 27.3 pg (ref 26.0–34.0)
MCHC: 32 g/dL (ref 30.0–36.0)
MCV: 85.4 fL (ref 78.0–100.0)
Platelets: 137 10*3/uL — ABNORMAL LOW (ref 150–400)
RBC: 2.67 MIL/uL — ABNORMAL LOW (ref 4.22–5.81)
RDW: 17.5 % — ABNORMAL HIGH (ref 11.5–15.5)
WBC: 7.6 10*3/uL (ref 4.0–10.5)

## 2016-05-22 LAB — BASIC METABOLIC PANEL
ANION GAP: 8 (ref 5–15)
BUN: 14 mg/dL (ref 6–20)
CHLORIDE: 104 mmol/L (ref 101–111)
CO2: 27 mmol/L (ref 22–32)
Calcium: 8.2 mg/dL — ABNORMAL LOW (ref 8.9–10.3)
Creatinine, Ser: 1.06 mg/dL (ref 0.61–1.24)
GFR calc Af Amer: >60 mL/min (ref >60)
GFR calc non Af Amer: >60 mL/min (ref >60)
Glucose, Bld: 117 mg/dL — ABNORMAL HIGH (ref 65–99)
Potassium: 3.4 mmol/L — ABNORMAL LOW (ref 3.5–5.1)
Sodium: 139 mmol/L (ref 135–145)

## 2016-05-22 LAB — HEMOGLOBIN AND HEMATOCRIT, BLOOD
HEMATOCRIT: 28.5 % — AB (ref 39.0–52.0)
HEMOGLOBIN: 9.4 g/dL — AB (ref 13.0–17.0)

## 2016-05-22 LAB — PREPARE RBC (CROSSMATCH)

## 2016-05-22 MED ORDER — POTASSIUM CHLORIDE CRYS ER 20 MEQ PO TBCR
40.0000 meq | EXTENDED_RELEASE_TABLET | Freq: Once | ORAL | Status: AC
  Administered 2016-05-22: 40 meq via ORAL
  Filled 2016-05-22: qty 2

## 2016-05-22 MED ORDER — FUROSEMIDE 10 MG/ML IJ SOLN
INTRAMUSCULAR | Status: AC
  Filled 2016-05-22: qty 2

## 2016-05-22 MED ORDER — FUROSEMIDE 10 MG/ML IJ SOLN
20.0000 mg | Freq: Once | INTRAMUSCULAR | Status: AC
  Administered 2016-05-22: 20 mg via INTRAVENOUS

## 2016-05-22 MED ORDER — SODIUM CHLORIDE 0.9 % IV SOLN
Freq: Once | INTRAVENOUS | Status: AC
  Administered 2016-05-22: 12:00:00 via INTRAVENOUS

## 2016-05-22 MED ORDER — FUROSEMIDE 10 MG/ML IJ SOLN
20.0000 mg | Freq: Once | INTRAMUSCULAR | Status: AC
  Administered 2016-05-22: 20 mg via INTRAVENOUS
  Filled 2016-05-22: qty 2

## 2016-05-22 NOTE — Progress Notes (Signed)
[facility] Progress Note  4 Days Post-Op  Subjective: CC: colon mass s/p partial colectomy/colostomy Reports he did not sleep well secondary to chest pain. Tolerating PO. Mobilizing. Having colostomy output that is dark.  hgb decreased and some oozing from abdominal wound s/p re-starting plavix.   Objective: Vital signs in last 24 hours: Temp:  [98.2 F (36.8 C)-99.6 F (37.6 C)] 98.2 F (36.8 C) (04/24 0432) Pulse Rate:  [87-100] 87 (04/24 0432) Resp:  [16-17] 16 (04/24 0432) BP: (108-134)/(62-120) 109/63 (04/24 0432) SpO2:  [90 %-93 %] 93 % (04/23 2146) Weight:  [74.3 kg (163 lb 12.8 oz)] 74.3 kg (163 lb 12.8 oz) (04/24 0432) Last BM Date: 05/21/16  Intake/Output from previous day: 04/23 0701 - 04/24 0700 In: 730 [P.O.:730] Out: 1330 [Urine:1025; Stool:305] Intake/Output this shift: Total I/O In: -  Out: 150 [Stool:150]  PE: Gen:  Alert, NAD, cooperative Card:  Regular rate and rhythm Pulm:  Non-labored, clear to auscultation bilaterally with diminished breath sounds in bilateral lung bases Abd: Soft, appropriately tender, mild distention, bowel sounds present, colostomy pouch with gas and black soft stool. Midline incision non-erythematous with some mild sanguinous oozing from medial aspect of incision.  Ext:  No erythema, edema, or tenderness  Lab Results:   Recent Labs  05/21/16 0330 05/22/16 0418  WBC 13.1* 7.6  HGB 8.3* 7.3*  HCT 26.7* 22.8*  PLT 131* 137*   BMET  Recent Labs  05/21/16 0330 05/22/16 0418  NA 138 139  K 4.1 3.4*  CL 105 104  CO2 30 27  GLUCOSE 128* 117*  BUN 24* 14  CREATININE 0.98 1.06  CALCIUM 8.2* 8.2*   PT/INR No results for input(s): LABPROT, INR in the last 72 hours. CMP     Component Value Date/Time   NA 139 05/22/2016 0418   K 3.4 (L) 05/22/2016 0418   CL 104 05/22/2016 0418   CO2 27 05/22/2016 0418   GLUCOSE 117 (H) 05/22/2016 0418   BUN 14 05/22/2016 0418   CREATININE 1.06 05/22/2016 0418   CREATININE 1.13 02/09/2015 0942   CALCIUM 8.2 (L) 05/22/2016 0418   PROT 5.8 (L) 05/09/2016 1700   ALBUMIN 3.4 (L) 05/09/2016 1700   AST 21 05/09/2016 1700   ALT 12 (L) 05/09/2016 1700   ALKPHOS 56 05/09/2016 1700   BILITOT 0.6 05/09/2016 1700   GFRNONAA >60 05/22/2016 0418   GFRAA >60 05/22/2016 0418   Anti-infectives: Anti-infectives    Start     Dose/Rate Route Frequency Ordered Stop   05/18/16 0900  cefoTEtan (CEFOTAN) 2 g in dextrose 5 % 50 mL IVPB     2 g 100 mL/hr over 30 Minutes Intravenous To [facility] 05/18/16 0826 05/18/16 1035   05/18/16 0827  cefoTEtan in Dextrose 5% (CEFOTAN) 2-2.08 GM-% IVPB    Comments:  Christopher [name]   : cabinet override      05/18/16 0827 05/18/16 2029     Assessment/Plan s/p Procedure(s): COLECTOMY WITH OSTOMY CREATION/HARTMANN PROCEDURE (Left)05/18/16 Dr [name] -  Path returned: Invasive colorectal adenocarcinoma 4.6 CM w/ extension into subserosal connective tissue, margins not involved, metastatic carcinoma in 1/12 nodes. -  Post-op ileus- resolving, now with gas and stool in pouch -  Advance diet as tolerated to heart healthy -  PT/ mobilize  -  Pulmonary toilet- needs aggressive help with this!  - pain: PO pain meds with IV for breakthrough   Leukocytosis - resolved  Chest pain - resolve; per cards, non-specific trop elevations with low  suspicion for ACS         - plavix was re-started on 05/20/16; hgb down to 7.3 today from 8.3   Plan: spoke with primary team - d/c plavix, 2 units pRBC, repeat CBC Monitor wound Oncology consult for adenocarcinoma   LOS: 6 days    Jill Alexanders , [facility] 05/22/2016, 9:25 AM Pager: [phone] Consults: [phone] Mon-Fri 7:00 am-4:30 pm Sat-Sun 7:00 am-11:30 am

## 2016-05-22 NOTE — Progress Notes (Signed)
CSW following for disposition. Pt referred for SNF placement.  Met with pt to provide bed offers. Pt's brother at bedside as well. Pt's brother reports family picks [facility]. Pt expresses he wishes to go back home at [place], brother spoke with pt emphasizing his concern about pt not being able to get the care he will need at [place] at home due to him living on 2nd floor of apartment building alone. Pt understanding and agrees to accept SNF bed offer.  Accepted bed in the [facility].   CSW will continue following.  Plan: [facility] at dc.  Sharren Bridge, MSW, LCSW Clinical Social Work 05/22/2016 [phone]

## 2016-05-22 NOTE — Progress Notes (Signed)
Subjective:  Anxious.  He was hallucinating when I went into the room and stated that red radiation was coming into the room as well as he was seeing hands and the devil.  No complaints of shortness of breath.  He is concerned that he will not be able to handle take care of the ostomy at this time.  Objective:  Vital Signs in the last 24 hours: BP 118/71 (BP Location: Right Arm)   Pulse 94   Temp 99.1 F (37.3 C) (Oral)   Resp 18   Ht 5\' 8"  (1.727 m)   Wt 74.3 kg (163 lb 12.8 oz)   SpO2 93%   BMI 24.91 kg/m   Physical Exam: Anxious Wm in NAD Lungs:  Clear Cardiac:  Regular rhythm, normal S1 and S2, no S3 Abdomen:  Ostomy present, mildly distended Extremities:  No edema present  Intake/Output from previous day: 04/23 0701 - 04/24 0700 In: 730 [P.O.:730] Out: 1330 [Urine:1025; Stool:305]  Weight Filed Weights   05/20/16 0532 05/21/16 0611 05/22/16 0432  Weight: 75.2 kg (165 lb 11.2 oz) 75.1 kg (165 lb 9.6 oz) 74.3 kg (163 lb 12.8 oz)    Lab Results: Basic Metabolic Panel:  Recent Labs  05/21/16 0330 05/22/16 0418  NA 138 139  K 4.1 3.4*  CL 105 104  CO2 30 27  GLUCOSE 128* 117*  BUN 24* 14  CREATININE 0.98 1.06   CBC:  Recent Labs  05/21/16 0330 05/22/16 0418  WBC 13.1* 7.6  HGB 8.3* 7.3*  HCT 26.7* 22.8*  MCV 87.5 85.4  PLT 131* 137*   Cardiac Panel (last 3 results)  Recent Labs  05/20/16 0212 05/20/16 0902 05/20/16 1607  TROPONINI 0.05* 0.06* 0.06*    Telemetry: Sinus  Personally reviewed Assessment/Plan:  1. Ischemic cardiomyopathy stable 2. CAD with prior stent 3. Anemia due to blood loss-somewhat lower today and is currently being transfused.. 4. Recent surgery for obstruction  Recommendation:  He is taking orals right now and I would stop his intravenous fluids to avoid volume overload.  Transfuse as needed.  He is back off of Plavix which I think is reasonable for the time being.     Kerry Hough  MD  [facility]  05/22/2016, 1:54 PM

## 2016-05-22 NOTE — Progress Notes (Signed)
[facility] PROGRESS NOTE  Christopher [name] VQX:[id number] DOB: 1960/03/22 DOA: 05/16/2016  PCP: Minerva Ends, MD  Brief History/Interval Summary: 56 year old Caucasian male with medical history significant of CAD/MI status post cardiac catheterization with stent placement and AICD, CHF, C KD, COPD, hyperlipidemia, tobacco use, presented with complaints of abdominal pain and distention. He underwent colonoscopy and was found to have an obstructing mass in the distal descending colon. [name] was hospitalized for further management. He underwent partial colectomy and colostomy placement.  Reason for Visit: Obstructing mass in the distal descending colon  Consultants: Gastroenterology. General surgery. Cardiology.  Procedures:   Transthoracic echocardiogram Study Conclusions - Left ventricle: The cavity size was mildly dilated. Wall   thickness was normal. Systolic function was severely reduced. The   estimated ejection fraction was in the range of 25% to 30%.   Akinesis of the anteroseptal, anterior, and apical myocardium.   Doppler parameters are consistent with abnormal left ventricular   relaxation (grade 1 diastolic dysfunction).  Partial colectomy, colostomy 4/20  Antibiotics: None  Subjective/Interval History: Patient is a poor historian. Remains anxious. Does not appear to have any significant discomfort in his abdomen. It appears that he might of had some chest discomfort overnight, but none currently.   ROS: Denies any headaches.  Objective:  Vital Signs  Vitals:   05/21/16 1322 05/21/16 1829 05/21/16 2146 05/22/16 0432  BP: (!) 134/120 108/65 112/62 109/63  Pulse: 100  93 87  Resp:   17 16  Temp: 98.6 F (37 C) 99.6 F (37.6 C) 98.4 F (36.9 C) 98.2 F (36.8 C)  TempSrc: Oral Oral Oral Oral  SpO2: 90%  93%   Weight:    74.3 kg (163 lb 12.8 oz)  Height:        Intake/Output Summary (Last 24 hours) at 05/22/16 0928 Last data filed at  05/22/16 0721  Gross per 24 hour  Intake              730 ml  Output             1355 ml  Net             -625 ml   Filed Weights   05/20/16 0532 05/21/16 0611 05/22/16 0432  Weight: 75.2 kg (165 lb 11.2 oz) 75.1 kg (165 lb 9.6 oz) 74.3 kg (163 lb 12.8 oz)    General appearance: Awake and alert. In no distress. Resp: Somewhat diminished entry at the bases. Mostly clear to auscultation. No wheezing, rales or rhonchi.  Cardio: S1, S2 is normal, regular. No S3, S4. No rubs, murmurs, or bruit. No pedal edema.  GI: Abdomen is soft. Dressing noted over the abdomen. Tender around the incision site. Bowel sounds are present. Black colored stool noted in the ostomy.  Extremities: No edema Neurologic: No focal deficits  Lab Results:  Data Reviewed: I have personally reviewed following labs and imaging studies  CBC:  Recent Labs Lab 05/18/16 0539 05/19/16 0558 05/20/16 0212 05/21/16 0330 05/22/16 0418  WBC 3.9* 9.1 12.4* 13.1* 7.6  HGB 10.0* 9.4* 9.9* 8.3* 7.3*  HCT 31.7* 29.9* 31.5* 26.7* 22.8*  MCV 85.9 86.7 87.0 87.5 85.4  PLT 137* 124* 118* 131* 137*    Basic Metabolic Panel:  Recent Labs Lab 05/18/16 0539 05/19/16 0558 05/20/16 0212 05/21/16 0330 05/22/16 0418  NA 140 136 138 138 139  K 3.3* 3.6 3.9 4.1 3.4*  CL 103 101 98* 105 104  CO2 26 28 31  30  27  GLUCOSE 126* 150* 157* 128* 117*  BUN 20 17 15  24* 14  CREATININE 1.25* 1.20 1.12 0.98 1.06  CALCIUM 8.7* 8.1* 8.5* 8.2* 8.2*    GFR: Estimated Creatinine Clearance: 78 mL/min (by C-G formula based on SCr of 1.06 mg/dL).   Radiology Studies: No results found.   Medications:  Scheduled: . acetaminophen  650 mg Oral Q8H  . aspirin  81 mg Oral Daily  . atorvastatin  80 mg Oral q1800  . carvedilol  3.125 mg Oral BID WC  . digoxin  0.125 mg Oral Daily  . furosemide  20 mg Intravenous Once  . furosemide  20 mg Intravenous Once  . lisinopril  2.5 mg Oral Daily  . pantoprazole (PROTONIX) IV  40 mg  Intravenous Q12H   Continuous: . sodium chloride    . dextrose 5 % and 0.45% NaCl 50 mL/hr at 05/21/16 2321   QBH:ALPFXTKW (SUBLIMAZE) injection, morphine injection, nicotine polacrilex, nitroGLYCERIN, ondansetron **OR** [DISCONTINUED] ondansetron (ZOFRAN) IV, oxyCODONE, promethazine  Assessment/Plan:  Principal Problem:   Partial bowel obstruction (HCC) Active Problems:   Chronic systolic CHF (congestive heart failure) (HCC)   Hyperlipidemia   AICD (automatic cardioverter/defibrillator) present   Anemia due to blood loss   Melena   Colonic mass   Essential hypertension   AKI (acute kidney injury) ([place])    Obstructing cancer in the distal descending colon causing Partial bowel obstruction Pathology shows invasive colorectal adenocarcinoma. Patient will need to be seen by oncology eventually. This mass was partially obstructing the colon. CT chest did not show any metastatic lesions. Patient seen by general surgery. Patient underwent partial colectomy and colostomy placement on 4/20. General surgery is following.   Acute blood loss anemia/symptomatic anemia  Hemoglobin continues to trend down. Discussed with the surgery PA. Apparently small amount of bleeding was noted in his incision site. There is also black stool in the ostomy bag. In view of this, Plavix will be stopped for now. Since patient has been having on-and-off chest pain which could be indicative of angina, he will be transfused 2 units of blood with Lasix in between and after. Patient agreeable to the same. Continue PPI for now.   Chest Pain Cardiology is following. Denies any chest pain currently but did have some overnight. Could be GI in origin. Troponins have been only minimally elevated. Cardiology does not think it is any concern for acute coronary syndrome.   History of coronary artery disease/chronic systolic CHF, status post AICD Last cath w/ stent placement 11/2015. Currently well compensated and w/o ACS  symptoms. Dr. Wynonia Lawman is following. Echocardiogram shows EF of 25-30%. Continue ASA, lipitor. Continue coreg and digoxin, lisinopril. Monitor fluid status closely in the postoperative period. Plavix was resumed 4/22 after discussions with Dr. Windle Guard. Plavix will be held for now. Consider any bleeding and anemia. Will be transfused.  Mild AKI Renal function is normal. Continue to monitor urine output.   Tobacco use Nicotine PRN.  DVT Prophylaxis: SCDs Code Status: Full code  Family Communication: Discussed with patient  Disposition Plan: Management as outlined above. Blood transfusion today. Plan is for the patient to go to skilled nursing facility when medically stable.    LOS: 6 days   [place] [facility] Pager [phone] 05/22/2016, 9:28 AM  If 7PM-7AM, please contact night-coverage at [email or web address], password [facility]

## 2016-05-22 NOTE — Care Management Important Message (Signed)
Important Message  Patient Details  Name: Christopher [name] MRN: [id number] Date of Birth: 02/27/1960   Medicare Important Message Given:  Yes    Chyann Ambrocio 05/22/2016, 9:00 AM

## 2016-05-22 NOTE — Care Management Note (Signed)
Case Management Note  Patient Details  Name: Christopher [name] MRN: [id number] Date of Birth: 1960/03/27  Subjective/Objective:                    Action/Plan:  For transfusion today , will continue to follow. SW following for SNF Expected Discharge Date:                  Expected Discharge Plan:  [place]  In-House Referral:  Clinical Social Work  Discharge planning Services     Post Acute Care Choice:    Choice offered to:     DME Arranged:    DME Agency:     HH Arranged:    [place] Agency:     Status of Service:  In process, will continue to follow  If discussed at Long Length of Stay Meetings, dates discussed:    Additional Comments:  Marilu Favre, RN 05/22/2016, 10:34 AM

## 2016-05-22 NOTE — Consult Note (Addendum)
[place] Nurse ostomy follow up Surgical team following for assessment and plan of care to abd wound. Stoma type/location:  Colostomy to LLQ Stomal assessment/size: Stoma red and viable, slightly above skin level Peristomal assessment: intact skin surrounding Output: Small amt liquid brown stool  Ostomy pouching: 1pc.  Education provided:  Current pouch was leaking behind the barrier.  Applied barrier ring to assist maintaining seal and one piece pouch.  Pt watched pouch change demonstration and was able to open and close the bottom of the bag.  He feels he will need assistance after discharge and "probably will go to a rehab place for a few weeks after discharge." Discussed pouching routines and ordering supplies. Educational materials left in the room and supplies ordered to the bedside for staff nurse use.   Enrolled patient in [place] Start Discharge program: Yes Julien Girt MSN, RN, [place], [place], [place]

## 2016-05-23 LAB — TYPE AND SCREEN
ABO/RH(D): A POS
Antibody Screen: NEGATIVE
Unit division: 0
Unit division: 0

## 2016-05-23 LAB — CBC
HCT: 30.1 % — ABNORMAL LOW (ref 39.0–52.0)
Hemoglobin: 10 g/dL — ABNORMAL LOW (ref 13.0–17.0)
MCH: 27.8 pg (ref 26.0–34.0)
MCHC: 33.2 g/dL (ref 30.0–36.0)
MCV: 83.6 fL (ref 78.0–100.0)
PLATELETS: 152 10*3/uL (ref 150–400)
RBC: 3.6 MIL/uL — AB (ref 4.22–5.81)
RDW: 17 % — ABNORMAL HIGH (ref 11.5–15.5)
WBC: 7.2 10*3/uL (ref 4.0–10.5)

## 2016-05-23 LAB — BASIC METABOLIC PANEL
Anion gap: 16 — ABNORMAL HIGH (ref 5–15)
BUN: 15 mg/dL (ref 6–20)
CHLORIDE: 98 mmol/L — AB (ref 101–111)
CO2: 25 mmol/L (ref 22–32)
CREATININE: 1.21 mg/dL (ref 0.61–1.24)
Calcium: 8.5 mg/dL — ABNORMAL LOW (ref 8.9–10.3)
GFR calc non Af Amer: >60 mL/min (ref >60)
Glucose, Bld: 98 mg/dL (ref 65–99)
Potassium: 3.1 mmol/L — ABNORMAL LOW (ref 3.5–5.1)
SODIUM: 139 mmol/L (ref 135–145)

## 2016-05-23 LAB — BPAM RBC
Blood Product Expiration Date: 201804302359
Blood Product Expiration Date: 201804302359
ISSUE DATE / TIME: 201804241118
ISSUE DATE / TIME: 201804241616
Unit Type and Rh: 600
Unit Type and Rh: 600

## 2016-05-23 MED ORDER — POTASSIUM CHLORIDE CRYS ER 20 MEQ PO TBCR
40.0000 meq | EXTENDED_RELEASE_TABLET | Freq: Once | ORAL | Status: AC
  Administered 2016-05-23: 40 meq via ORAL
  Filled 2016-05-23: qty 2

## 2016-05-23 MED ORDER — PANTOPRAZOLE SODIUM 40 MG PO PACK
40.0000 mg | PACK | Freq: Two times a day (BID) | ORAL | Status: DC
  Administered 2016-05-23 – 2016-05-26 (×5): 40 mg via ORAL
  Filled 2016-05-23 (×7): qty 20

## 2016-05-23 NOTE — Progress Notes (Signed)
Subjective:  Lying in bed with eyes closed.  Complains of significant nausea.  Says that he can't take care of the ostial bag.  He says that he is still seeing visual hallucinations in the room and states that it is images of hell and overseas lords.  Very depressed.  One out of 12 lymph nodes was positive on the pathology report.  Objective:  Vital Signs in the last 24 hours: BP (!) 144/77 (BP Location: Right Arm)   Pulse 77   Temp 98.1 F (36.7 C) (Oral)   Resp 19   Ht 5\' 8"  (1.727 m)   Wt 74.3 kg (163 lb 12.8 oz)   SpO2 96%   BMI 24.91 kg/m   Physical Exam: Anxious Wm in NAD, lying in bed with eyes closed.   Lungs:  Clear Cardiac:  Regular rhythm, normal S1 and S2, no S3 Abdomen:  Ostomy present, mildly distended Extremities:  No edema present  Intake/Output from previous day: 04/24 0701 - 04/25 0700 In: 643 [Blood:643] Out: 4330 [Urine:3200; Stool:1130]  Weight Filed Weights   05/20/16 0532 05/21/16 0611 05/22/16 0432  Weight: 75.2 kg (165 lb 11.2 oz) 75.1 kg (165 lb 9.6 oz) 74.3 kg (163 lb 12.8 oz)    Lab Results: Basic Metabolic Panel:  Recent Labs  05/22/16 0418 05/23/16 0516  NA 139 139  K 3.4* 3.1*  CL 104 98*  CO2 27 25  GLUCOSE 117* 98  BUN 14 15  CREATININE 1.06 1.21   CBC:  Recent Labs  05/22/16 0418 05/22/16 1933 05/23/16 0516  WBC 7.6  --  7.2  HGB 7.3* 9.4* 10.0*  HCT 22.8* 28.5* 30.1*  MCV 85.4  --  83.6  PLT 137*  --  152   Cardiac Panel (last 3 results)  Recent Labs  05/20/16 0902 05/20/16 1607  TROPONINI 0.06* 0.06*    Telemetry: Sinus  Personally reviewed Assessment/Plan:  1. Ischemic cardiomyopathy stable 2. CAD with prior stent 3. Anemia due to blood loss-Higher following transfusion  4. Recent surgery for obstruction 5. Significant depression as well as hallucinations   Recommendation:  He really doesn't look ready to me to go out to the nursing home.  I'm concerned about his depression as well as his  hallucinations.  Wondered if this could be due to the pain medicines he is on.  Needs to be able to get out of bed and having a great deal of difficulty adjusting to his pouch.  Cardiac status is reasonable.  Suggest start Plavix when okay with surgery.     Kerry Hough  MD [facility]  05/23/2016, 8:53 AM

## 2016-05-23 NOTE — Progress Notes (Signed)
[place] [facility] Progress Note  5 Days Post-Op  Subjective: CC: colon mass s/p partial colectomy/colostomy Endorses abdominal soreness. Patient feels unmotivated this AM - does not want to get out of bed. Complains that taking PO medications hurts his throat and requesting IV meds. Denies chest pain.   hgb rose appropriately to 10.0 s/p transfusion and decrease in IVF Objective: Vital signs in last 24 hours: Temp:  [97.4 F (36.3 C)-99.4 F (37.4 C)] 98.1 F (36.7 C) (04/25 0504) Pulse Rate:  [77-102] 77 (04/25 0504) Resp:  [17-20] 19 (04/25 0504) BP: (104-144)/(37-78) 144/77 (04/25 0504) SpO2:  [90 %-96 %] 96 % (04/25 0504) Last BM Date: 05/22/16  Intake/Output from previous day: 04/24 0701 - 04/25 0700 In: 643 [Blood:643] Out: 4330 [Urine:3200; Stool:1130] Intake/Output this shift: No intake/output data recorded.  PE: Gen: Alert, NAD, cooperative HEENT: oral pharynx healthy pink mucosa no exudate  Card: Regular rate and rhythm Pulm: Non-labored, clear to auscultation bilaterally with diminished breath sounds in bilateral lung bases Abd: Soft, appropriately tender, mild distention, bowel sounds present, colostomy pouch with gas and liquid brown stool - melena improving. No active drainage from midline wound. Ext: No erythema, edema, or tenderness  Lab Results:   Recent Labs  05/22/16 0418 05/22/16 1933 05/23/16 0516  WBC 7.6  --  7.2  HGB 7.3* 9.4* 10.0*  HCT 22.8* 28.5* 30.1*  PLT 137*  --  152   BMET  Recent Labs  05/22/16 0418 05/23/16 0516  NA 139 139  K 3.4* 3.1*  CL 104 98*  CO2 27 25  GLUCOSE 117* 98  BUN 14 15  CREATININE 1.06 1.21  CALCIUM 8.2* 8.5*   PT/INR No results for input(s): LABPROT, INR in the last 72 hours. CMP     Component Value Date/Time   NA 139 05/23/2016 0516   K 3.1 (L) 05/23/2016 0516   CL 98 (L) 05/23/2016 0516   CO2 25 05/23/2016 0516   GLUCOSE 98 05/23/2016 0516   BUN 15 05/23/2016 0516   CREATININE  1.21 05/23/2016 0516   CREATININE 1.13 02/09/2015 0942   CALCIUM 8.5 (L) 05/23/2016 0516   PROT 5.8 (L) 05/09/2016 1700   ALBUMIN 3.4 (L) 05/09/2016 1700   AST 21 05/09/2016 1700   ALT 12 (L) 05/09/2016 1700   ALKPHOS 56 05/09/2016 1700   BILITOT 0.6 05/09/2016 1700   GFRNONAA >60 05/23/2016 0516   GFRAA >60 05/23/2016 0516   Lipase  No results found for: LIPASE     Studies/Results: No results found.  Anti-infectives: Anti-infectives    Start     Dose/Rate Route Frequency Ordered Stop   05/18/16 0900  cefoTEtan (CEFOTAN) 2 g in dextrose 5 % 50 mL IVPB     2 g 100 mL/hr over 30 Minutes Intravenous To [facility] 05/18/16 0826 05/18/16 1035   05/18/16 0827  cefoTEtan in Dextrose 5% (CEFOTAN) 2-2.08 GM-% IVPB    Comments:  Laurita Quint   : cabinet override      05/18/16 0827 05/18/16 2029     Assessment/Plan /p Procedure(s): COLECTOMY WITH OSTOMY CREATION/HARTMANN PROCEDURE (Left)05/18/16 Dr [name] - Path returned: Invasive colorectal adenocarcinoma 4.6 CM w/ extension into subserosal connective tissue, margins not involved, metastatic carcinoma in 1/12 nodes. - Post-op ileus- resolving, now with gas and stool in pouch - SOFT diet; advance as tolerated to heart healthy - PT/ mobilize  - Pulmonary toilet- needs aggressive help with this!  -  pain: PO pain meds with IV for breakthrough; pt  currently only taking tylenol  Leukocytosis - resolved  Chest pain - per cards, non-specific trop elevations with low suspicion for ACS                    - plavix held 2/2 anemia and post-operative melena/incisional sanguinous oozing  ABL anemia - received plavix 4/22-4/23; plavix then held when hgb decreased to 7.2; s/p 2 [facility] 4/24; hgb now 10.0  Plan: continue to hold plavix  Oncology consult for adenocarcinoma  SNF at discharge, appreciate social work/case management assistance   LOS: 7 days    Jill Alexanders , [facility] 05/23/2016,  8:53 AM Pager: [phone] Consults: [phone] Mon-Fri 7:00 am-4:30 pm Sat-Sun 7:00 am-11:30 am

## 2016-05-23 NOTE — Progress Notes (Signed)
[facility] PROGRESS NOTE  Christopher [name] YIR:[id number] DOB: [birth year] DOA: 05/16/2016  PCP: Minerva Ends, MD  Brief History/Interval Summary: 56 year old Caucasian male with medical history significant of CAD/MI status post cardiac catheterization with stent placement and AICD, CHF, C KD, COPD, hyperlipidemia, tobacco use, presented with complaints of abdominal pain and distention. He underwent colonoscopy and was found to have an obstructing mass in the distal descending colon. [name] was hospitalized for further management. He underwent partial colectomy and colostomy placement.  Reason for Visit: Obstructing mass in the distal descending colon  Consultants: Gastroenterology. General surgery. Cardiology.  Procedures:   Transthoracic echocardiogram Study Conclusions - Left ventricle: The cavity size was mildly dilated. Wall   thickness was normal. Systolic function was severely reduced. The   estimated ejection fraction was in the range of 25% to 30%.   Akinesis of the anteroseptal, anterior, and apical myocardium.   Doppler parameters are consistent with abnormal left ventricular   relaxation (grade 1 diastolic dysfunction).  Partial colectomy, colostomy 4/20  Antibiotics: None  Subjective/Interval History: Pt complained of some increased nausea and abdominal discomfort earlier this am.  ROS: Denies any headaches.  Objective:  Vital Signs  Vitals:   05/22/16 1836 05/22/16 2052 05/23/16 0504 05/23/16 1439  BP: 125/75 106/72 (!) 144/77 (!) 90/56  Pulse: 86 77 77 89  Resp: 18 19 19    Temp: 98.2 F (36.8 C) 98.3 F (36.8 C) 98.1 F (36.7 C) 98.5 F (36.9 C)  TempSrc: Oral Oral Oral Oral  SpO2: 95% 95% 96% 95%  Weight:      Height:        Intake/Output Summary (Last 24 hours) at 05/23/16 1614 Last data filed at 05/23/16 1100  Gross per 24 hour  Intake              455 ml  Output             3575 ml  Net            -3120 ml   Filed  Weights   05/20/16 0532 05/21/16 0611 05/22/16 0432  Weight: 75.2 kg (165 lb 11.2 oz) 75.1 kg (165 lb 9.6 oz) 74.3 kg (163 lb 12.8 oz)    General appearance: Awake and alert. In no distress. Resp: Somewhat diminished entry at the bases. Mostly clear to auscultation. No wheezing, rales or rhonchi.  Cardio: S1, S2 is normal, regular. No S3, S4. No rubs, murmurs, or bruit. No pedal edema.  GI: Abdomen is soft. Dressing noted over the abdomen. Tender around the incision site. Bowel sounds are present.  Extremities: No edema Neurologic: No focal deficits  Lab Results:  Data Reviewed: I have personally reviewed following labs and imaging studies  CBC:  Recent Labs Lab 05/19/16 0558 05/20/16 0212 05/21/16 0330 05/22/16 0418 05/22/16 1933 05/23/16 0516  WBC 9.1 12.4* 13.1* 7.6  --  7.2  HGB 9.4* 9.9* 8.3* 7.3* 9.4* 10.0*  HCT 29.9* 31.5* 26.7* 22.8* 28.5* 30.1*  MCV 86.7 87.0 87.5 85.4  --  83.6  PLT 124* 118* 131* 137*  --  500    Basic Metabolic Panel:  Recent Labs Lab 05/19/16 0558 05/20/16 0212 05/21/16 0330 05/22/16 0418 05/23/16 0516  NA 136 138 138 139 139  K 3.6 3.9 4.1 3.4* 3.1*  CL 101 98* 105 104 98*  CO2 28 31 30 27 25   GLUCOSE 150* 157* 128* 117* 98  BUN 17 15 24* 14 15  CREATININE 1.20 1.12 0.98  1.06 1.21  CALCIUM 8.1* 8.5* 8.2* 8.2* 8.5*    GFR: Estimated Creatinine Clearance: 68.3 mL/min (by C-G formula based on SCr of 1.21 mg/dL).   Radiology Studies: No results found.   Medications:  Scheduled: . acetaminophen  650 mg Oral Q8H  . aspirin  81 mg Oral Daily  . atorvastatin  80 mg Oral q1800  . carvedilol  3.125 mg Oral BID WC  . digoxin  0.125 mg Oral Daily  . lisinopril  2.5 mg Oral Daily  . pantoprazole sodium  40 mg Oral BID  . potassium chloride  40 mEq Oral Once   Continuous:  NIO:EVOJJKKX (SUBLIMAZE) injection, morphine injection, nicotine polacrilex, nitroGLYCERIN, ondansetron **OR** [DISCONTINUED] ondansetron (ZOFRAN) IV,  oxyCODONE, promethazine  Assessment/Plan:  Principal Problem:   Partial bowel obstruction (HCC) Active Problems:   Chronic systolic CHF (congestive heart failure) (HCC)   Hyperlipidemia   AICD (automatic cardioverter/defibrillator) present   Anemia due to blood loss   Adenocarcinoma of colon ([place])   Essential hypertension    Obstructing cancer in the distal descending colon causing Partial bowel obstruction Pathology shows invasive colorectal adenocarcinoma. Patient will need to be seen by oncology eventually. This mass was partially obstructing the colon. CT chest did not show any metastatic lesions. Patient seen by general surgery. Patient underwent partial colectomy and colostomy placement on 4/20. General surgery is following.  - continue supportive care.  Acute blood loss anemia/symptomatic anemia  Hemoglobin continues to trend down. Discussed with the surgery PA. Apparently small amount of bleeding was noted in his incision site. There is also black stool in the ostomy bag. In view of this, Plavix will be stopped for now. Since patient has been having on-and-off chest pain which could be indicative of angina, he will be transfused 2 units of blood with Lasix in between and after. Patient agreeable to the same. Continue PPI for now.   Chest Pain Cardiology is following. Denies any chest pain currently but did have some overnight. Could be GI in origin. Troponins have been only minimally elevated. Cardiology does not think it is any concern for acute coronary syndrome.   History of coronary artery disease/chronic systolic CHF, status post AICD Last cath w/ stent placement 11/2015. Currently well compensated and w/o ACS symptoms. Dr. Wynonia Lawman is following. Echocardiogram shows EF of 25-30%. Continue ASA, lipitor. Continue coreg and digoxin, lisinopril. Monitor fluid status closely in the postoperative period. Plavix was resumed 4/22 after discussions with Dr. Windle Guard. Plavix will be held  for now. Consider any bleeding and anemia. Will be transfused.  Mild AKI Resolved and S creatinine wnl  Tobacco use Nicotine PRN.  DVT Prophylaxis: SCDs Code Status: Full code  Family Communication: Discussed with patient  Disposition Plan: Management as outlined above. Plan is for the patient to go to skilled nursing facility when medically stable.     LOS: 7 days   Velvet Bathe  [facility] Pager [phone] 05/23/2016, 4:14 PM  If 7PM-7AM, please contact night-coverage at [email or web address], password [facility]

## 2016-05-23 NOTE — Progress Notes (Signed)
Physical Therapy Treatment Patient Details Name: Christopher [name] MRN: [id number] DOB: 1960-04-18 Today's Date: 05/23/2016    History of Present Illness Pt is a 56 y/o male presenting to ED on 4/18 with bowel pain and found to have a mass concerning for malignancy. Pt is now s/p L colectomy with colostomy creation in L UQ on 4/20. PMH including but not limited to CAD s/p MI in 2015, AICD placement in 2015, CHF, CKD, and COPD.    PT Comments    Pt is making slow progress towards his goals. Pt is not highly motivated today but agrees to get out of bed to take a short walk. Pt is supervision for bed mobility, and minAx1 for transfers to RW and ambulation of 270 feet with RW. Pt requires continued skilled PT progress ambulation and improve LE strength and endurance to safely navigate his discharge environment.   Follow Up Recommendations  SNF;Supervision/Assistance - 24 hour     Equipment Recommendations  Rolling walker with 5" wheels;3in1 (PT)       Precautions / Restrictions Precautions Precautions: Fall Restrictions Weight Bearing Restrictions: No    Mobility  Bed Mobility Overal bed mobility: Needs Assistance Bed Mobility: Supine to Sit;Sit to Supine     Supine to sit: Supervision Sit to supine: Supervision   General bed mobility comments: increased time and effort, use of bed rails, supervision for safety to achieve sitting EOB  Transfers Overall transfer level: Needs assistance Equipment used: Rolling walker (2 wheeled) Transfers: Sit to/from Stand Sit to Stand: Min assist         General transfer comment: stabile transition to upright, vc for push off from the bed  Ambulation/Gait Ambulation/Gait assistance: Min assist Ambulation Distance (Feet): 270 Feet Assistive device: Rolling walker (2 wheeled) Gait Pattern/deviations: Step-through pattern;Decreased stride length;Shuffle Gait velocity: decreased Gait velocity interpretation: Below normal speed for  age/gender General Gait Details: pt able to ambulate with slow steady cadence, vc for management of RW and more upright posture       Balance Overall balance assessment: Needs assistance Sitting-balance support: No upper extremity supported Sitting balance-Leahy Scale: Fair     Standing balance support: During functional activity;Single extremity supported Standing balance-Leahy Scale: Good Standing balance comment: pt able to balance in standing without UE support                            Cognition Arousal/Alertness: Awake/alert Behavior During Therapy: Impulsive;Flat affect Overall Cognitive Status: No family/caregiver present to determine baseline cognitive functioning Area of Impairment: Attention;Problem solving;Awareness                   Current Attention Level: Selective       Awareness: Emergent Problem Solving: Difficulty sequencing;Requires verbal cues General Comments: pt anxious about radiation and cancer treatment             Pertinent Vitals/Pain Faces Pain Scale: Hurts little more Pain Location: abdomen Pain Descriptors / Indicators: Sore;Grimacing;Guarding           PT Goals (current goals can now be found in the care plan section) Acute Rehab PT Goals Patient Stated Goal: to get some sleep PT Goal Formulation: With patient Time For Goal Achievement: 06/02/16 Potential to Achieve Goals: Good Progress towards PT goals: Progressing toward goals    Frequency    Min 3X/week      PT Plan Current plan remains appropriate    Co-evaluation  End of Session Equipment Utilized During Treatment: Gait belt (placed high on chest) Activity Tolerance: Patient limited by fatigue Patient left: with call bell/phone within reach;in chair Nurse Communication: Mobility status (need for linen change) PT Visit Diagnosis: Unsteadiness on feet (R26.81);Other abnormalities of gait and mobility (R26.89);Pain Pain - part of  body:  (abdomen)     Time: 5501-5868 PT Time Calculation (min) (ACUTE ONLY): 31 min  Charges:  $Gait Training: 8-22 mins $Therapeutic Activity: 8-22 mins                    G Codes:       Shalayne Leach B. Migdalia Dk PT, DPT Acute Rehabilitation  [phone] Pager [phone]     [place] 05/23/2016, 2:01 PM

## 2016-05-24 LAB — CBC
HCT: 31 % — ABNORMAL LOW (ref 39.0–52.0)
HEMOGLOBIN: 10.1 g/dL — AB (ref 13.0–17.0)
MCH: 27.4 pg (ref 26.0–34.0)
MCHC: 32.6 g/dL (ref 30.0–36.0)
MCV: 84.2 fL (ref 78.0–100.0)
Platelets: 218 10*3/uL (ref 150–400)
RBC: 3.68 MIL/uL — AB (ref 4.22–5.81)
RDW: 16.7 % — ABNORMAL HIGH (ref 11.5–15.5)
WBC: 8.5 10*3/uL (ref 4.0–10.5)

## 2016-05-24 MED ORDER — FENTANYL CITRATE (PF) 100 MCG/2ML IJ SOLN: 12.5000 ug | INTRAMUSCULAR | Status: DC | PRN

## 2016-05-24 MED ORDER — ENSURE ENLIVE PO LIQD
237.0000 mL | Freq: Three times a day (TID) | ORAL | Status: DC
  Administered 2016-05-24 – 2016-05-29 (×11): 237 mL via ORAL

## 2016-05-24 MED ORDER — OXYCODONE HCL 5 MG PO TABS: 5.0000 mg | ORAL_TABLET | ORAL | Status: DC | PRN

## 2016-05-24 NOTE — Consult Note (Addendum)
[place] Nurse ostomy follow up Stoma type/location: Stoma red and viable when visualized through pouch, which is intact with good seal and was changed yesterday by bedside nurse Output: Mod amt semiformed brown stool  Ostomy pouching: 1pc.  Education provided:  Reviewed pouching routines.  Pt plans to transfer to SNF after discharge.  He states he has assisted in emptying the pouch.  Supplies at bedside for staff nurse use.  Will continue to follow for further teaching sessions while in the hospital. Enrolled patient in [facility] Discharge program: Yes Julien Girt MSN, RN, [place], [place], [place]

## 2016-05-24 NOTE — Progress Notes (Signed)
Subjective:  His nausea is somewhat better today.  He complains of significant difficulty swallowing and states that it hurts to swallow pills.  No complaints of chest pain or shortness of breath.  Still with some hallucinations but we pointed out the objects in the room that he was interpreted as real people are objects and reoriented him.  Still concerned about the hallucinations.  Objective:  Vital Signs in the last 24 hours: BP 98/60 (BP Location: Right Arm)   Pulse 74   Temp 98.6 F (37 C) (Oral)   Resp 17   Ht 5\' 8"  (1.727 m)   Wt 72.9 kg (160 lb 11.5 oz)   SpO2 92%   BMI 24.44 kg/m   Physical Exam: Anxious Wm in NAD, lying in bed in no acute distress  Lungs:  Clear Cardiac:  Regular rhythm, normal S1 and S2, no S3 Abdomen:  Ostomy present, mildly distended Extremities:  No edema present  Intake/Output from previous day: 04/25 0701 - 04/26 0700 In: 360 [P.O.:360] Out: 700 [Urine:300; Stool:400]  Weight Filed Weights   05/21/16 0611 05/22/16 0432 05/24/16 0457  Weight: 75.1 kg (165 lb 9.6 oz) 74.3 kg (163 lb 12.8 oz) 72.9 kg (160 lb 11.5 oz)    Lab Results: Basic Metabolic Panel:  Recent Labs  05/22/16 0418 05/23/16 0516  NA 139 139  K 3.4* 3.1*  CL 104 98*  CO2 27 25  GLUCOSE 117* 98  BUN 14 15  CREATININE 1.06 1.21   CBC:  Recent Labs  05/22/16 0418 05/22/16 1933 05/23/16 0516  WBC 7.6  --  7.2  HGB 7.3* 9.4* 10.0*  HCT 22.8* 28.5* 30.1*  MCV 85.4  --  83.6  PLT 137*  --  152   Telemetry: Sinus With occasional PVCs. Personally reviewed  Assessment/Plan:  1. Ischemic cardiomyopathy stable 2. CAD with prior stent 3. Anemia due to blood loss-Higher following transfusion  4. Recent surgery for obstruction 5. Significant depression as well as hallucinations   Recommendation:  He was able to ambulate some yesterday.  He has virtually no social support and has significant depression and is having a difficult time adjusting to the ostomy as  well as the surgery.  I tried to encourage him some and agree with holding Plavix for now but would like to restart this when surgery thinks it's okay.     Kerry Hough  MD [facility]  05/24/2016, 9:00 AM

## 2016-05-24 NOTE — Progress Notes (Signed)
[facility] PROGRESS NOTE  Christopher [name] CNO:[id number] DOB: Nov 06, 1960 DOA: 05/16/2016  PCP: Christopher Ends, MD  Brief History/Interval Summary: 56 year old Caucasian male with medical history significant of CAD/MI status post cardiac catheterization with stent placement and AICD, CHF, C KD, COPD, hyperlipidemia, tobacco use, presented with complaints of abdominal pain and distention. He underwent colonoscopy and was found to have an obstructing mass in the distal descending colon. [name] was hospitalized for further management. He underwent partial colectomy and colostomy placement.  Reason for Visit: Obstructing mass in the distal descending colon  Consultants: Gastroenterology. General surgery. Cardiology.  Procedures:   Transthoracic echocardiogram Study Conclusions - Left ventricle: The cavity size was mildly dilated. Wall   thickness was normal. Systolic function was severely reduced. The   estimated ejection fraction was in the range of 25% to 30%.   Akinesis of the anteroseptal, anterior, and apical myocardium.   Doppler parameters are consistent with abnormal left ventricular   relaxation (grade 1 diastolic dysfunction).  Partial colectomy, colostomy 4/20  Antibiotics: None  Subjective/Interval History: Pt has had some visual hallucinations.   ROS: Denies any headaches.  Objective:  Vital Signs  Vitals:   05/23/16 0504 05/23/16 1439 05/23/16 2140 05/24/16 0457  BP: (!) 144/77 (!) 90/56 118/60 98/60  Pulse: 77 89 78 74  Resp: 19  18 17   Temp: 98.1 F (36.7 C) 98.5 F (36.9 C) 98.3 F (36.8 C) 98.6 F (37 C)  TempSrc: Oral Oral Oral Oral  SpO2: 96% 95% 96% 92%  Weight:    72.9 kg (160 lb 11.5 oz)  Height:        Intake/Output Summary (Last 24 hours) at 05/24/16 1339 Last data filed at 05/24/16 0819  Gross per 24 hour  Intake              240 ml  Output              650 ml  Net             -410 ml   Filed Weights   05/21/16 0611  05/22/16 0432 05/24/16 0457  Weight: 75.1 kg (165 lb 9.6 oz) 74.3 kg (163 lb 12.8 oz) 72.9 kg (160 lb 11.5 oz)    General appearance: Awake and alert. In no distress. Resp: Somewhat diminished entry at the bases. Mostly clear to auscultation. No wheezing, rales or rhonchi.  Cardio: S1, S2 is normal, regular. No S3, S4. No rubs, murmurs, or bruit. No pedal edema.  GI: Abdomen is soft. Dressing noted over the abdomen. Tender around the incision site. Bowel sounds are present.  Extremities: No edema Neurologic: No focal deficits  Lab Results:  Data Reviewed: I have personally reviewed following labs and imaging studies  CBC:  Recent Labs Lab 05/20/16 0212 05/21/16 0330 05/22/16 0418 05/22/16 1933 05/23/16 0516 05/24/16 1137  WBC 12.4* 13.1* 7.6  --  7.2 8.5  HGB 9.9* 8.3* 7.3* 9.4* 10.0* 10.1*  HCT 31.5* 26.7* 22.8* 28.5* 30.1* 31.0*  MCV 87.0 87.5 85.4  --  83.6 84.2  PLT 118* 131* 137*  --  152 294    Basic Metabolic Panel:  Recent Labs Lab 05/19/16 0558 05/20/16 0212 05/21/16 0330 05/22/16 0418 05/23/16 0516  NA 136 138 138 139 139  K 3.6 3.9 4.1 3.4* 3.1*  CL 101 98* 105 104 98*  CO2 28 31 30 27 25   GLUCOSE 150* 157* 128* 117* 98  BUN 17 15 24* 14 15  CREATININE 1.20  1.12 0.98 1.06 1.21  CALCIUM 8.1* 8.5* 8.2* 8.2* 8.5*    GFR: Estimated Creatinine Clearance: 68.3 mL/min (by C-G formula based on SCr of 1.21 mg/dL).   Radiology Studies: No results found.   Medications:  Scheduled: . acetaminophen  650 mg Oral Q8H  . aspirin  81 mg Oral Daily  . atorvastatin  80 mg Oral q1800  . carvedilol  3.125 mg Oral BID WC  . digoxin  0.125 mg Oral Daily  . lisinopril  2.5 mg Oral Daily  . pantoprazole sodium  40 mg Oral BID   Continuous:  GLO:VFIEPPIR (SUBLIMAZE) injection, nicotine polacrilex, nitroGLYCERIN, ondansetron **OR** [DISCONTINUED] ondansetron (ZOFRAN) IV, oxyCODONE, promethazine  Assessment/Plan:  Principal Problem:   Partial bowel  obstruction (HCC) Active Problems:   Chronic systolic CHF (congestive heart failure) (HCC)   Hyperlipidemia   AICD (automatic cardioverter/defibrillator) present   Anemia due to blood loss   Adenocarcinoma of colon ([place])   Essential hypertension    Obstructing cancer in the distal descending colon causing Partial bowel obstruction Pathology shows invasive colorectal adenocarcinoma. Patient will need to be seen by oncology eventually. This mass was partially obstructing the colon. CT chest did not show any metastatic lesions. Patient seen by general surgery. Patient underwent partial colectomy and colostomy placement on 4/20. General surgery is following.  - continue supportive care.  Visual hallucinations - Will decrease his pain medication significantly, d/c morphine. - may have to try IV acetaminophen should patient continue to have hallucinations. Otherwise if that doesn't work will plan on consulting psychiatry  Acute blood loss anemia/symptomatic anemia   Continue PPI for now.  - HGB stable currently  Chest Pain Cardiology is assiting. Currently stable  History of coronary artery disease/chronic systolic CHF, status post AICD Last cath w/ stent placement 11/2015. Currently well compensated and w/o ACS symptoms. Dr. Wynonia [name] is following. Echocardiogram shows EF of 25-30%. Continue ASA, lipitor. Continue coreg and digoxin, lisinopril. Monitor fluid status closely in the postoperative period. Plavix was resumed 4/22 after discussions with Dr. Windle [name]. Plavix will be held for now. Consider any bleeding and anemia.  - stable. Awaiting word from surgery to see when to restart plavix.  Mild AKI Resolved and S creatinine wnl  Tobacco use Nicotine PRN.  DVT Prophylaxis: SCDs Code Status: Full code  Family Communication: Discussed with patient  Disposition Plan: Management as outlined above. Plan is for the patient to go to skilled nursing facility when medically stable.     LOS:  8 days   Christopher [name]  [facility] Pager [phone] 05/24/2016, 1:39 PM  If 7PM-7AM, please contact night-coverage at [email or web address], password [facility]

## 2016-05-24 NOTE — Progress Notes (Signed)
Patient successfully emptied colostomy bag.

## 2016-05-24 NOTE — Progress Notes (Signed)
[facility] Progress Note  6 Days Post-Op  Subjective: CC: odynophagia and abdominal discomfort  Pt complains of painful swallowing which makes him not want to take his pain medication. Abdominal pain is improving however patient is still very uncomfortable with his colostomy pouch. Today he reports that he is still having hallucinations seeing "the devil" on the sharps container and "god" in the evening under the glow of the computer screen.   Objective: Vital signs in last 24 hours: Temp:  [98.3 F (36.8 C)-98.6 F (37 C)] 98.6 F (37 C) (04/26 0457) Pulse Rate:  [74-89] 74 (04/26 0457) Resp:  [17-18] 17 (04/26 0457) BP: (90-118)/(56-60) 98/60 (04/26 0457) SpO2:  [92 %-96 %] 92 % (04/26 0457) Weight:  [72.9 kg (160 lb 11.5 oz)] 72.9 kg (160 lb 11.5 oz) (04/26 0457) Last BM Date: 05/23/16  Intake/Output from previous day: 04/25 0701 - 04/26 0700 In: 360 [P.O.:360] Out: 700 [Urine:300; Stool:400] Intake/Output this shift: Total I/O In: -  Out: 200 [Urine:200]  PE: Gen: Alert, NAD, cooperative HEENT: oral pharynx healthy pink mucosa no exudate  Card: Regular rate and rhythm Pulm: Non-labored, clear to auscultation bilaterally Abd: Soft, appropriately tender, bowel sounds present, colostomy pouch with gas and liquid brown stool. No active drainage from midline wound. Ext: No erythema, edema, or tenderness  Lab Results:   Recent Labs  05/22/16 0418 05/22/16 1933 05/23/16 0516  WBC 7.6  --  7.2  HGB 7.3* 9.4* 10.0*  HCT 22.8* 28.5* 30.1*  PLT 137*  --  152   BMET  Recent Labs  05/22/16 0418 05/23/16 0516  NA 139 139  K 3.4* 3.1*  CL 104 98*  CO2 27 25  GLUCOSE 117* 98  BUN 14 15  CREATININE 1.06 1.21  CALCIUM 8.2* 8.5*   PT/INR No results for input(s): LABPROT, INR in the last 72 hours. CMP     Component Value Date/Time   NA 139 05/23/2016 0516   K 3.1 (L) 05/23/2016 0516   CL 98 (L) 05/23/2016 0516   CO2 25 05/23/2016 0516   GLUCOSE 98 05/23/2016 0516   BUN 15 05/23/2016 0516   CREATININE 1.21 05/23/2016 0516   CREATININE 1.13 02/09/2015 0942   CALCIUM 8.5 (L) 05/23/2016 0516   PROT 5.8 (L) 05/09/2016 1700   ALBUMIN 3.4 (L) 05/09/2016 1700   AST 21 05/09/2016 1700   ALT 12 (L) 05/09/2016 1700   ALKPHOS 56 05/09/2016 1700   BILITOT 0.6 05/09/2016 1700   GFRNONAA >60 05/23/2016 0516   GFRAA >60 05/23/2016 0516   Lipase  No results found for: LIPASE     Studies/Results: No results found.  Anti-infectives: Anti-infectives    Start     Dose/Rate Route Frequency Ordered Stop   05/18/16 0900  cefoTEtan (CEFOTAN) 2 g in dextrose 5 % 50 mL IVPB     2 g 100 mL/hr over 30 Minutes Intravenous To [facility] 05/18/16 0826 05/18/16 1035   05/18/16 0827  cefoTEtan in Dextrose 5% (CEFOTAN) 2-2.08 GM-% IVPB    Comments:  Laurita Quint   : cabinet override      05/18/16 0827 05/18/16 2029     Assessment/Plan S/p Procedure(s): COLECTOMY WITH OSTOMY CREATION/HARTMANN PROCEDURE (Left)05/18/16 Dr [name] - Path returned: Invasive colorectal adenocarcinoma 4.6 CM w/ extension into subserosal connective tissue, margins not involved, metastatic carcinoma in 1/12 nodes. -  Bowel function returned, gas and stool in colostomy pouch  - SOFT diet; advance as tolerated to heart healthy - PT/ mobilize! -  Pulmonary toilet- needs aggressive help with this!  -  pain: PO pain meds with IV for breakthrough; pt currently only taking tylenol  Leukocytosis - resolved  Chest pain - per cards, non-specific trop elevations with low suspicion for ACS - plavix held 2/2 anemia and post-operative melena/incisional sanguinous oozing  ABL anemia - received plavix 4/22-4/23; plavix then held when hgb decreased to 7.2; s/p 2 [facility] 4/24  Plan: CBC pending. If stable, it is ok from a surgical standpoint to re-start plavix again.  MOBILIZE!  Consider psychiatric consult for hallucinations SNF at discharge,  appreciate social work/case management assistance   LOS: 8 days    Jill Alexanders , [facility] 05/24/2016, 8:48 AM Pager: [phone] Consults: [phone] Mon-Fri 7:00 am-4:30 pm Sat-Sun 7:00 am-11:30 am

## 2016-05-24 NOTE — Progress Notes (Signed)
Initial Nutrition Assessment  DOCUMENTATION CODES:   Not applicable  INTERVENTION:   -Ensure Enlive po TID, each supplement provides 350 kcal and 20 grams of protein  NUTRITION DIAGNOSIS:   Inadequate oral intake related to poor appetite, nausea as evidenced by meal completion < 50%.  GOAL:   Patient will meet greater than or equal to 90% of their needs  MONITOR:   PO intake, Supplement acceptance, Labs, Weight trends, Skin, I & O's  REASON FOR ASSESSMENT:   Malnutrition Screening Tool    ASSESSMENT:   Christopher [name] is a 56 y.o. male with medical history significant of CAD/MI status post cardiac catheterization with stent placement and AICD, CHF, C KD, COPD, hyperlipidemia, tobacco use. Patient presented to [facility] emergency room on the morning of 05/16/2016 at approximately 06:00 with complaints of abdominal pain. Patient has chronic issues with abdominal pain but this became significantly worse when he attempted to take a bowel prep in anticipation of colonoscopy scheduled for the day of admission.  S/p Procedure(s) 05/18/16: LEFT COLECTOMY WITH OSTOMY CREATION/HARTMANN PROCEDURE  Per chart review, pathology revealed colorectal adenocarcinoma.   Spoke with pt who reports he generally has a good appetite. He denies any weight loss PTA, states UBW has been between 155-158# over the past several years, which is consistent with wt hx.   Pt shares he has been eating very poorly due to nausea, however, biggest barrier is painful swallowing. He shares painful swallowing when taking medications and clear liquids. He has been consuming a lot of milk, however, is concerned about consumption causing gas. Documented meal intake 0-50% (averaging 25% meal completion).   Pt reports he has significant periodontal disease, however, this does not impact his ability to eat solid foods.   Nutrition-Focused physical exam completed. Findings are no fat depletion, no muscle depletion, and no  edema.   Discussed importance of good meal intake to promote healing. Pt amenable to try Ensure supplements.   Labs reviewed: K: 3.1.   Diet Order:  DIET SOFT Room service appropriate? Yes; Fluid consistency: Thin  Skin:  Reviewed, no issues  Last BM:  05/23/16  Height:   Ht Readings from Last 1 Encounters:  05/16/16 5\' 8"  (1.727 m)    Weight:   Wt Readings from Last 1 Encounters:  05/24/16 160 lb 11.5 oz (72.9 kg)    Ideal Body Weight:  50 kg  BMI:  Body mass index is 24.44 kg/m.  Estimated Nutritional Needs:   Kcal:  1700-1900  Protein:  95-110 grams  Fluid:  > 1.7 L  EDUCATION NEEDS:   Education needs addressed  Elham Fini A. Jimmye Norman, RD, LDN, CDE Pager: [phone] After hours Pager: [phone]

## 2016-05-25 DIAGNOSIS — C189 Malignant neoplasm of colon, unspecified: Secondary | ICD-10-CM

## 2016-05-25 DIAGNOSIS — Z810 Family history of intellectual disabilities: Secondary | ICD-10-CM

## 2016-05-25 DIAGNOSIS — Z87891 Personal history of nicotine dependence: Secondary | ICD-10-CM

## 2016-05-25 DIAGNOSIS — F28 Other psychotic disorder not due to a substance or known physiological condition: Secondary | ICD-10-CM

## 2016-05-25 MED ORDER — CLOPIDOGREL BISULFATE 75 MG PO TABS
75.0000 mg | ORAL_TABLET | Freq: Every day | ORAL | Status: DC
  Administered 2016-05-25 – 2016-05-29 (×5): 75 mg via ORAL
  Filled 2016-05-25 (×5): qty 1

## 2016-05-25 NOTE — Progress Notes (Signed)
Concern for developing wound infection. Foul-smelling light brown drainage from abdominal wound. Staples removed at bedside. Wound cultures taken. Wet-to-dry dressing placed. Patient tolerated this well. No complications. Start BID wet-to-dry dressing changes tomorrow AM 05/26/16. Close wound monitoring for bleeding with re-initiation of Plavix. Afebrile, VSS, no leukocytosis.   Obie Dredge, PA-C [facility] Pager: [phone] Consults: [phone] Mon-Fri 7:00 am-4:30 pm Sat-Sun 7:00 am-11:30 am

## 2016-05-25 NOTE — Progress Notes (Signed)
PT Cancellation Note  Patient Details Name: Christopher [name] MRN: [id number] DOB: [birth year]   Cancelled Treatment:    Reason Eval/Treat Not Completed: Patient at procedure or test/unavailable Pt getting staples removed. PT will check on pt later as time allows.    Salina April, PTA Pager: [phone]   05/25/2016, 2:57 PM

## 2016-05-25 NOTE — Progress Notes (Signed)
Subjective:  Nausea is better today and he is sitting up in a chair and is clearly in much better spirits.  Psychologically appears to be adjusting to the ostomy better.  Hallucinations still present but better.  No shortness of breath or chest pain.  Objective:  Vital Signs in the last 24 hours: BP 103/72 (BP Location: Left Arm)   Pulse 79   Temp 97.8 F (36.6 C) (Oral)   Resp 16   Ht 5\' 8"  (1.727 m)   Wt 71.7 kg (158 lb)   SpO2 96%   BMI 24.02 kg/m   Physical Exam: Calm appearing Wm in NAD, sitting in chair in no acute distress  Lungs:  Clear Cardiac:  Regular rhythm, normal S1 and S2, no S3 Abdomen:  Ostomy present, mildly distended Extremities:  No edema present  Intake/Output from previous day: 04/26 0701 - 04/27 0700 In: 1340 [P.O.:1340] Out: 850 [Urine:850]  Weight Filed Weights   05/22/16 0432 05/24/16 0457 05/25/16 0500  Weight: 74.3 kg (163 lb 12.8 oz) 72.9 kg (160 lb 11.5 oz) 71.7 kg (158 lb)    Lab Results: Basic Metabolic Panel:  Recent Labs  05/23/16 0516  NA 139  K 3.1*  CL 98*  CO2 25  GLUCOSE 98  BUN 15  CREATININE 1.21   CBC:  Recent Labs  05/23/16 0516 05/24/16 1137  WBC 7.2 8.5  HGB 10.0* 10.1*  HCT 30.1* 31.0*  MCV 83.6 84.2  PLT 152 218   Telemetry: Sinus With occasional PVCs. Personally reviewed  Assessment/Plan:  1. Ischemic cardiomyopathy stable 2. CAD with prior stent 3. Anemia due to blood loss-Stable  4. Recent surgery for obstruction 5.  Depression as well as hallucinations appear to be slightly better.   Recommendation:  Surgery says okay to restart Plavix so will start that again today.  Continue to increase activity.  Cardiac status is stable.      Kerry Hough  MD [facility] Cardiology  05/25/2016, 1:09 PM

## 2016-05-25 NOTE — Progress Notes (Signed)
[facility] PROGRESS NOTE  Christopher [name] BTD:[id number] DOB: [birth year] DOA: 05/16/2016  PCP: Minerva Ends, MD  Brief History/Interval Summary: 56 year old Caucasian male with medical history significant of CAD/MI status post cardiac catheterization with stent placement and AICD, CHF, C KD, COPD, hyperlipidemia, tobacco use, presented with complaints of abdominal pain and distention. He underwent colonoscopy and was found to have an obstructing mass in the distal descending colon. [name] was hospitalized for further management. He underwent partial colectomy and colostomy placement.  Reason for Visit: Obstructing mass in the distal descending colon  Consultants: Gastroenterology. General surgery. Cardiology.  Procedures:   Transthoracic echocardiogram Study Conclusions - Left ventricle: The cavity size was mildly dilated. Wall   thickness was normal. Systolic function was severely reduced. The   estimated ejection fraction was in the range of 25% to 30%.   Akinesis of the anteroseptal, anterior, and apical myocardium.   Doppler parameters are consistent with abnormal left ventricular   relaxation (grade 1 diastolic dysfunction).  Partial colectomy, colostomy 4/20  Antibiotics: None  Subjective/Interval History: Pt denies any visual hallucinations today.  ROS: Denies any headaches.  Objective:  Vital Signs  Vitals:   05/24/16 2038 05/25/16 0442 05/25/16 0500 05/25/16 1406  BP: 102/64 103/72  102/64  Pulse: 74 79  79  Resp: 17 16  18   Temp: 98.2 F (36.8 C) 97.8 F (36.6 C)  98.3 F (36.8 C)  TempSrc: Oral Oral  Oral  SpO2: 96% 96%  94%  Weight:   71.7 kg (158 lb)   Height:        Intake/Output Summary (Last 24 hours) at 05/25/16 1628 Last data filed at 05/25/16 1155  Gross per 24 hour  Intake              900 ml  Output              400 ml  Net              500 ml   Filed Weights   05/22/16 0432 05/24/16 0457 05/25/16 0500  Weight: 74.3  kg (163 lb 12.8 oz) 72.9 kg (160 lb 11.5 oz) 71.7 kg (158 lb)    General appearance: Awake and alert. In no distress. Resp: Somewhat diminished entry at the bases. Mostly clear to auscultation. No wheezing, rales or rhonchi.  Cardio: S1, S2 is normal, regular. No S3, S4. No rubs, murmurs, or bruit. No pedal edema.  GI: Abdomen is soft. Dressing noted over the abdomen. Tender around the incision site. Bowel sounds are present.  Extremities: No edema Neurologic: No focal deficits  Lab Results:  Data Reviewed: I have personally reviewed following labs and imaging studies  CBC:  Recent Labs Lab 05/20/16 0212 05/21/16 0330 05/22/16 0418 05/22/16 1933 05/23/16 0516 05/24/16 1137  WBC 12.4* 13.1* 7.6  --  7.2 8.5  HGB 9.9* 8.3* 7.3* 9.4* 10.0* 10.1*  HCT 31.5* 26.7* 22.8* 28.5* 30.1* 31.0*  MCV 87.0 87.5 85.4  --  83.6 84.2  PLT 118* 131* 137*  --  152 364    Basic Metabolic Panel:  Recent Labs Lab 05/19/16 0558 05/20/16 0212 05/21/16 0330 05/22/16 0418 05/23/16 0516  NA 136 138 138 139 139  K 3.6 3.9 4.1 3.4* 3.1*  CL 101 98* 105 104 98*  CO2 28 31 30 27 25   GLUCOSE 150* 157* 128* 117* 98  BUN 17 15 24* 14 15  CREATININE 1.20 1.12 0.98 1.06 1.21  CALCIUM 8.1* 8.5* 8.2*  8.2* 8.5*    GFR: Estimated Creatinine Clearance: 68.3 mL/min (by C-G formula based on SCr of 1.21 mg/dL).   Radiology Studies: No results found.   Medications:  Scheduled: . acetaminophen  650 mg Oral Q8H  . aspirin  81 mg Oral Daily  . atorvastatin  80 mg Oral q1800  . carvedilol  3.125 mg Oral BID WC  . clopidogrel  75 mg Oral Daily  . digoxin  0.125 mg Oral Daily  . feeding supplement (ENSURE ENLIVE)  237 mL Oral TID BM  . lisinopril  2.5 mg Oral Daily  . pantoprazole sodium  40 mg Oral BID   Continuous:  BWG:YKZLDJTT (SUBLIMAZE) injection, nicotine polacrilex, nitroGLYCERIN, ondansetron **OR** [DISCONTINUED] ondansetron (ZOFRAN) IV, oxyCODONE,  promethazine  Assessment/Plan:  Principal Problem:   Partial bowel obstruction (HCC) Active Problems:   Chronic systolic CHF (congestive heart failure) (HCC)   Hyperlipidemia   AICD (automatic cardioverter/defibrillator) present   Anemia due to blood loss   Adenocarcinoma of colon ([place])   Essential hypertension    Obstructing cancer in the distal descending colon causing Partial bowel obstruction Pathology shows invasive colorectal adenocarcinoma. Patient will need to be seen by oncology eventually.   General surgery monitoring wound and are concerned about possible wound infection.  Visual hallucinations - Consulted psychiatry - pt denies them today but there is still concern. Decreased pain medication regimen.  Acute blood loss anemia/symptomatic anemia   Continue PPI for now.  - HGB stable currently  Chest Pain Cardiology is assiting. Currently stable  History of coronary artery disease/chronic systolic CHF, status post AICD Last cath w/ stent placement 11/2015. Currently well compensated and w/o ACS symptoms. Dr. Wynonia Lawman is following. Echocardiogram shows EF of 25-30%. Continue ASA, lipitor. Continue coreg and digoxin, lisinopril. Monitor fluid status closely in the postoperative period. Plavix was resumed 4/22 after discussions with Dr. Windle Guard. Plavix will be held for now. Consider any bleeding and anemia.  - stable. - General surgery ok with continuing plavix.  Mild AKI Resolved and S creatinine wnl  Tobacco use Nicotine PRN.  DVT Prophylaxis: SCDs Code Status: Full code  Family Communication: Discussed with patient  Disposition Plan: Management as outlined above. Plan is for the patient to go to skilled nursing facility when medically stable.     LOS: 9 days   Christopher [name], [place] [facility] Pager [phone] 05/25/2016, 4:28 PM  If 7PM-7AM, please contact night-coverage at [email or web address], password [facility]

## 2016-05-25 NOTE — Consult Note (Signed)
[facility] Face-to-Face Psychiatry Consult   Reason for Consult:  Hallucinations and s/p colectomy and colostomy Referring Physician:  Dr. Wendee Beavers Patient Identification: Christopher [name] MRN:  [id number] Principal Diagnosis: Partial bowel obstruction [facility]) Diagnosis:   Patient Active Problem List   Diagnosis Date Noted  . Partial bowel obstruction ([place]) [K56.600] 05/16/2016  . Essential hypertension [I10] 05/16/2016  . Adenocarcinoma of colon ([place]) [C18.9] 05/11/2016  . Anemia due to blood loss [D50.0] 05/03/2016  . Anxiety state [F41.1] 01/17/2015  . Cardiomyopathy, ischemic [I25.5]   . Thrombocytopenia ([place]) [D69.6] 12/27/2014  . AICD (automatic cardioverter/defibrillator) present [Z95.810]   . Financial difficulties [Z59.8] 10/01/2013  . CAD (coronary artery disease), native coronary artery [I25.10] 07/03/2013  . Tobacco use [Z72.0] 07/03/2013  . Chronic systolic CHF (congestive heart failure) ([place]) [I50.22]   . Hyperlipidemia [E78.5]   . Old anterior myocardial infarction [I25.2] 06/20/2013    Total Time spent with patient: 1 hour  Subjective:   Christopher [name] is a 56 y.o. male patient admitted with abdominal distention and pain.Marland Kitchen  HPI:  Christopher [name] is a 56 years old male, reportedly he is a gay, never married and has no children, living by himself in an apartment with the fixed income from disability. Patient reported his roommate died in 2009-04-30. Patient has 2 brothers and one sister who has been in contact with him and at the same time somewhat distant. Patient reportedly dropped out of high school and then completed GED and worked so hard as a Doctor, hospital. Patient endorses abdominal pain and distention on arrival to the hospital and also received colectomy and Colostomy done few days ago. She stated he still has blood oozing from his surgical wounds does not feel he can care for himself at this time. Patient is worried about taking 15 steps to go into his apartment.  Patient also reported he has been seeing a devil which is attending color near the sink off his room. Patient does not believe it is severe and also believes it is secondary to some of the medication given to him while in the hospital. Patient reported he has never been treated for psychiatric conditions and has no legal problems and never been in criminal justice system. Patient also reported his visual hallucinations will go away without specific medication intervention. A shunt minimize the need of psychotropic medication at this time. Patient does not appear to be responding to internal stimuli. Patient denies current suicidal/homicidal ideation, intention or plans. Patient has not either hallucinations or paranoid delusions.  Medical History: 56 year old Caucasian male with medical history significant of CAD/MI status post cardiac catheterization with stent placement and AICD, CHF, C KD, COPD, hyperlipidemia, tobacco use, presented with complaints of abdominal pain and distention. He underwent colonoscopy and was found to have an obstructing mass in the distal descending colon. [name] was hospitalized for further management. He underwent partial colectomy and colostomy placement  Past Psychiatric History: Anxiety but has no psychiatric treatments.  Risk to Self: Is patient at risk for suicide?: No Risk to Others:   Prior Inpatient Therapy:   Prior Outpatient Therapy:    Past Medical History:  Past Medical History:  Diagnosis Date  . Automatic implantable cardioverter-defibrillator in situ    MDT Aug 2015 Dr. Lovena Le  . CAD (coronary artery disease), native coronary artery 07/03/2013   Cath 06/20/13  Normal left main, occluded LAD, occluded RCA, 50% circ EF 15%  3.0 x28 and 3.0 x 8 mm Xience stent Dr. Tamala Julian  To LAD   . Chronic kidney disease   . Chronic systolic CHF (congestive heart failure) (HCC)    ECHO 08/05/13  EF 30%  Anterior akinesis and inferior hypokinesis   . COPD (chronic  obstructive pulmonary disease) ([place])   . Hyperlipidemia   . Mass of colon 04/2016  . Myocardial infarction ([place]) 04/29/2013  . Thrombocytopenia ([place]) 12/27/2014   Chronic      Past Surgical History:  Procedure Laterality Date  . CARDIAC CATHETERIZATION  05/2013   . CARDIAC CATHETERIZATION N/A 12/28/2014   Procedure: Left Heart Cath and Coronary Angiography;  Surgeon: [name], MD;  Location: [place] CV LAB;  Service: Cardiovascular;  Laterality: N/A;  . CARDIAC CATHETERIZATION N/A 12/28/2014   Procedure: Coronary Stent Intervention;  Surgeon: [name], MD;  Location: [place] CV [facility];  Service: Cardiovascular;  Laterality: N/A;  . COLECTOMY WITH COLOSTOMY CREATION/HARTMANN PROCEDURE Left 05/18/2016   Procedure: COLECTOMY WITH OSTOMY CREATION/HARTMANN PROCEDURE;  Surgeon: [name], MD;  Location: [place];  Service: General;  Laterality: Left;  . CORONARY ANGIOPLASTY  05/2013   . FLEXIBLE SIGMOIDOSCOPY N/A 05/16/2016   Procedure: FLEXIBLE SIGMOIDOSCOPY;  Surgeon: [name], MD;  Location: [place];  Service: Gastroenterology;  Laterality: N/A;  . ICD placement  09/07/2013   . IMPLANTABLE CARDIOVERTER DEFIBRILLATOR IMPLANT N/A 09/07/2013   Procedure: IMPLANTABLE CARDIOVERTER DEFIBRILLATOR IMPLANT;  Surgeon: [name], MD;  Location: [facility] CATH LAB;  Service: Cardiovascular;  Laterality: N/A;  . INTRA-AORTIC BALLOON PUMP INSERTION  06/20/2013   Procedure: INTRA-AORTIC BALLOON PUMP INSERTION;  Surgeon: Sinclair Grooms, MD;  Location: [facility] CATH LAB;  Service: Cardiovascular;;  . LEFT HEART CATHETERIZATION WITH CORONARY ANGIOGRAM N/A 06/20/2013   Procedure: LEFT HEART CATHETERIZATION WITH CORONARY ANGIOGRAM;  Surgeon: Sinclair Grooms, MD;  Location: [facility] CATH LAB;  Service: Cardiovascular;  Laterality: N/A;  . PERCUTANEOUS CORONARY STENT INTERVENTION (PCI-S)  06/20/2013   Procedure: PERCUTANEOUS CORONARY STENT INTERVENTION (PCI-S);  Surgeon: Sinclair Grooms, MD;   Location: [facility] CATH LAB;  Service: Cardiovascular;;   Family History:  Family History  Problem Relation Age of Onset  . Emphysema Father   . Heart disease Mother   . Dementia Mother   . Diabetes Mother   . Pancreatic cancer Maternal Grandmother   . Lung cancer Maternal Grandmother   . Cancer Neg Hx    Family Psychiatric  History: Noncontributory Social History:  History  Alcohol Use No    Comment: quit in 08/2013      History  Drug Use No    Social History   Social History  . Marital status: Single    Spouse name: N/A  . Number of children: 0  . Years of education: 12   Occupational History  . Unemployed     Social History Main Topics  . Smoking status: Former Smoker    Packs/day: 1.50    Years: 30.00    Types: Cigarettes    Quit date: 12/22/2014  . Smokeless tobacco: Never Used  . Alcohol use No     Comment: quit in 08/2013   . Drug use: No  . Sexual activity: Not Currently    Birth control/ protection: Condom     Comment: men    Other Topics Concern  . None   Social History Narrative   Lives alone.    In a house trying to sell his home. Exercise: No.   Additional Social History:    Allergies:  Allergies  Allergen Reactions  . Celebrex [Celecoxib] Other (See Comments)    Caused his back to burn  . Contrast Media [Iodinated Diagnostic Agents] Other (See Comments)    Patient feels weakness    Labs:  Results for orders placed or performed during the hospital encounter of 05/16/16 (from the past 48 hour(s))  CBC     Status: Abnormal   Collection Time: 05/24/16 11:37 AM  Result Value Ref Range   WBC 8.5 4.0 - 10.5 K/uL   RBC 3.68 (L) 4.22 - 5.81 MIL/uL   Hemoglobin 10.1 (L) 13.0 - 17.0 g/dL   HCT 31.0 (L) 39.0 - 52.0 %   MCV 84.2 78.0 - 100.0 fL   MCH 27.4 26.0 - 34.0 pg   MCHC 32.6 30.0 - 36.0 g/dL   RDW 16.7 (H) 11.5 - 15.5 %   Platelets 218 150 - 400 K/uL    Current Facility-Administered Medications  Medication Dose Route Frequency  Provider Last Rate Last Dose  . acetaminophen (TYLENOL) tablet 650 mg  650 mg Oral [address] [place], PA-C   650 mg at 05/24/16 2142  . aspirin chewable tablet 81 mg  81 mg Oral Daily Waldemar Dickens, MD   81 mg at 05/25/16 0945  . atorvastatin (LIPITOR) tablet 80 mg  80 mg Oral q1800 Waldemar Dickens, MD   80 mg at 05/24/16 1850  . carvedilol (COREG) tablet 3.125 mg  3.125 mg Oral BID WC Bonnielee Haff, MD   3.125 mg at 05/25/16 0758  . digoxin (LANOXIN) tablet 0.125 mg  0.125 mg Oral Daily Waldemar Dickens, MD   0.125 mg at 05/25/16 0945  . feeding supplement (ENSURE ENLIVE) (ENSURE ENLIVE) liquid 237 mL  237 mL Oral TID BM Velvet Bathe, MD   237 mL at 05/25/16 0949  . fentaNYL (SUBLIMAZE) injection 12.5 mcg  12.5 mcg Intravenous Q2H PRN Velvet Bathe, MD      . lisinopril (PRINIVIL,ZESTRIL) tablet 2.5 mg  2.5 mg Oral Daily Waldemar Dickens, MD   2.5 mg at 05/25/16 0945  . nicotine polacrilex (NICORETTE) gum 2 mg  2 mg Oral PRN Waldemar Dickens, MD      . nitroGLYCERIN (NITROSTAT) SL tablet 0.4 mg  0.4 mg Sublingual Q5 min PRN Reubin Milan, MD   0.4 mg at 05/20/16 0131  . ondansetron (ZOFRAN) tablet 4 mg  4 mg Oral Q6H PRN Waldemar Dickens, MD   4 mg at 05/23/16 1804  . oxyCODONE (Oxy IR/ROXICODONE) immediate release tablet 5 mg  5 mg Oral Q4H PRN Velvet Bathe, MD      . pantoprazole sodium (PROTONIX) 40 mg/20 mL oral suspension 40 mg  40 mg Oral BID Velvet Bathe, MD   40 mg at 05/24/16 2142  . promethazine (PHENERGAN) injection 12.5 mg  12.5 mg Intravenous Q4H PRN Reubin Milan, MD   12.5 mg at 05/23/16 2027    Musculoskeletal: Strength & Muscle Tone: decreased Gait & Station: normal Patient leans: Right  Psychiatric Specialty Exam: Physical Exam as per history and physical   ROS complaining of generalized weakness wheezing from the surgical wounds, he has limited social support and worried about taking fifteen steps to get into his apartment.  No Fever-chills, No Headache, No  changes with Vision or hearing, reports vertigo No problems swallowing food or Liquids, No Chest pain, Cough or Shortness of Breath, No Abdominal pain, No Nausea or Vommitting, Bowel movements are regular, No Blood in stool or Urine,  No dysuria, No new skin rashes or bruises, No new joints pains-aches,  No new weakness, tingling, numbness in any extremity, No recent weight gain or loss, No polyuria, polydypsia or polyphagia,  A full 10 point Review of Systems was done, except as stated above, all other Review of Systems were negative.  Blood pressure 103/72, pulse 79, temperature 97.8 F (36.6 C), temperature source Oral, resp. rate 16, height 5\' 8"  (1.727 m), weight 71.7 kg (158 lb), SpO2 96 %.Body mass index is 24.02 kg/m.  General Appearance: Guarded  Eye Contact:  Good  Speech:  Clear and Coherent  Volume:  Normal  Mood:  Anxious  Affect:  Appropriate and Congruent  Thought Process:  Coherent and Goal Directed  Orientation:  Full (Time, Place, and Person)  Thought Content:  WDL  Suicidal Thoughts:  No  Homicidal Thoughts:  No  Memory:  Immediate;   Good Recent;   Fair Remote;   Fair  Judgement:  Fair  Insight:  Fair  Psychomotor Activity:  Normal  Concentration:  Concentration: Fair and Attention Span: Fair  Recall:  Good  Fund of Knowledge:  Good  Language:  Good  Akathisia:  Negative  Handed:  Right  AIMS (if indicated):     Assets:  Communication Skills Desire for Improvement Financial Resources/Insurance Housing Leisure Time Resilience Social Support  ADL's:  Intact  Cognition:  WNL  Sleep:        Treatment Plan Summary: Patient admitted with bowel obstruction and Status post surgery of Colectomy and colostomy and endorses visual hallucinations and denied need of medication management. He has no history of psychiatric treatment and no safety concerns.   Psychosis secondary to [facility], visual hallucinations  Recommendation:  Patient has no safety  concerns Patient may benefit from mild dose of antipsychotics like risperidone 0.25 mg BID/PRN for psychosis Patient does not consent for medication and hopes the hallucination will be going away with other medication changes Review the current medications and discontinue or decrease the dosages if possible to avoid medication induced confusion or hallucination and also possible electrolyte imbalances Personally I don't see any psychogenic medications or stimulant on his medication regimen Appreciate psychiatric consultation and we sign off as of today Please contact [phone] or [phone] if needs further assistance   Disposition: Supportive therapy provided about ongoing stressors.  Ambrose Finland, MD 05/25/2016 10:32 AM

## 2016-05-25 NOTE — Care Management Important Message (Signed)
Important Message  Patient Details  Name: Christopher [name] MRN: [id number] Date of Birth: March 06, 1960   Medicare Important Message Given:  Yes    Nathen May 05/25/2016, 2:41 PM

## 2016-05-25 NOTE — Progress Notes (Signed)
[facility] Progress Note  7 Days Post-Op  Subjective: CC: s/p sigmoid colectomy Pain improving. Tolerating PO. Mobilizing with therapies. Emptying colostomy pouch independently. Reports less pain with swallowing today Still having hallucinations  hgb stable today 10.1   Objective: Vital signs in last 24 hours: Temp:  [97.8 F (36.6 C)-98.4 F (36.9 C)] 97.8 F (36.6 C) (04/27 0442) Pulse Rate:  [74-80] 79 (04/27 0442) Resp:  [16-17] 16 (04/27 0442) BP: (102-107)/(64-72) 103/72 (04/27 0442) SpO2:  [96 %-97 %] 96 % (04/27 0442) Weight:  [71.7 kg (158 lb)] 71.7 kg (158 lb) (04/27 0500) Last BM Date: 05/24/16  Intake/Output from previous day: 04/26 0701 - 04/27 0700 In: 1340 [P.O.:1340] Out: 850 [Urine:850] Intake/Output this shift: No intake/output data recorded.  PE: Gen:  Alert, NAD, pleasant sitting up in recliner  Card:  Regular rate and rhythm, pedal pulses 2+ BL Pulm:  Normal effort, clear to auscultation bilaterally Abd: Soft, non-tender, non-distended, bowel sounds present in all 4 quadrants, no HSM, midline incision with staples in place, no surrounding erythema - compression of proximal aspect of incision causes foul-smelling, light brown drainage from center of wound. Replaced dry dressing. Skin: warm and dry, no rashes  Psych: A&Ox3   Lab Results:   Recent Labs  05/23/16 0516 05/24/16 1137  WBC 7.2 8.5  HGB 10.0* 10.1*  HCT 30.1* 31.0*  PLT 152 218   BMET  Recent Labs  05/23/16 0516  NA 139  K 3.1*  CL 98*  CO2 25  GLUCOSE 98  BUN 15  CREATININE 1.21  CALCIUM 8.5*   PT/INR No results for input(s): LABPROT, INR in the last 72 hours. CMP     Component Value Date/Time   NA 139 05/23/2016 0516   K 3.1 (L) 05/23/2016 0516   CL 98 (L) 05/23/2016 0516   CO2 25 05/23/2016 0516   GLUCOSE 98 05/23/2016 0516   BUN 15 05/23/2016 0516   CREATININE 1.21 05/23/2016 0516   CREATININE 1.13 02/09/2015 0942   CALCIUM 8.5 (L) 05/23/2016  0516   PROT 5.8 (L) 05/09/2016 1700   ALBUMIN 3.4 (L) 05/09/2016 1700   AST 21 05/09/2016 1700   ALT 12 (L) 05/09/2016 1700   ALKPHOS 56 05/09/2016 1700   BILITOT 0.6 05/09/2016 1700   GFRNONAA >60 05/23/2016 0516   GFRAA >60 05/23/2016 0516    Anti-infectives: Anti-infectives    Start     Dose/Rate Route Frequency Ordered Stop   05/18/16 0900  cefoTEtan (CEFOTAN) 2 g in dextrose 5 % 50 mL IVPB     2 g 100 mL/hr over 30 Minutes Intravenous To ShortStay Surgical 05/18/16 0826 05/18/16 1035   05/18/16 0827  cefoTEtan in Dextrose 5% (CEFOTAN) 2-2.08 GM-% IVPB    Comments:  Laurita Quint   : cabinet override      05/18/16 0827 05/18/16 2029     Assessment/Plan S/p Procedure(s): COLECTOMY WITH OSTOMY CREATION/HARTMANN PROCEDURE (Left)05/18/16 Dr [name] - Path returned: Invasive colorectal adenocarcinoma 4.6 CM w/ extension into subserosal connective tissue, margins not involved, metastatic carcinoma in 1/12 nodes. -  Bowel function returned, gas and stool in colostomy pouch  - drainage from midline wound, will follow and will have MD evaluate - SOFT diet; advance as tolerated to heart healthy - PT/ mobilize! - Pulmonary toilet- needs aggressive help with this!  - pain: PO pain meds with IV for breakthrough  Leukocytosis - resolved  Chest pain - per cards, non-specific trop elevations with low suspicion for ACS -  plavix held 2/2 anemia and post-operative melena/incisional sanguinous oozing  ABL anemia - received plavix 4/22-4/23; plavix then held when hgb decreased to 7.2; s/p 2 [facility] 4/24  Plan: mobilize, continue PO pain control  OK to resume plavix from surgical standpoint    LOS: 9 days    Jill Alexanders , [facility] Surgery 05/25/2016, 9:17 AM Pager: [phone] Consults: [phone] Mon-Fri 7:00 am-4:30 pm Sat-Sun 7:00 am-11:30 am

## 2016-05-25 NOTE — Clinical Social Work Note (Signed)
Pt hallucinations still present, but have improved, per MD note. Continued medical workup for pt. CSW continues to follow for d/c to [facility], when medically stable.   Oretha Ellis, [place], [place] Work [phone]

## 2016-05-25 NOTE — Consult Note (Signed)
[place] Nurse ostomy follow up Pt assisted with pouch change and was able to open and close without assistance. Stoma type/location: Stoma red and viable, 1 3/4 inches, slightly above skin level Output: Mod amt semiformed brown stool  Ostomy pouching: 1pc.  Education provided:  Reviewed pouching routines.  Pt plans to transfer to SNF after discharge. Reviewed pouching routines and ordering supplies at home.  Extra supplies at bedside for staff nurse use.  Pt denies further questions. Enrolled patient in [place] Start Discharge program: Yes Julien Girt MSN, RN, [place], [place], [place]

## 2016-05-25 NOTE — Progress Notes (Signed)
Physical Therapy Treatment Patient Details Name: Christopher [name] MRN: [id number] DOB: 1960/03/28 Today's Date: 05/25/2016    History of Present Illness Pt is a 56 y/o male presenting to ED on 4/18 with bowel pain and found to have a mass concerning for malignancy. Pt is now s/p L colectomy with colostomy creation in L U[facility] on 4/20. PMH including but not limited to CAD s/p MI in 2015, AICD placement in 2015, CHF, CKD, and COPD.    PT Comments    Patient is progressing well toward mobility goals. Patient will continue to benefit from further skilled PT services in acute setting as well as upon d/c home to maximize indpendence and safety with mobility   Follow Up Recommendations  Home health PT;Supervision - Intermittent     Equipment Recommendations  3in1 (PT)    Recommendations for Other Services       Precautions / Restrictions Precautions Precautions: Fall Restrictions Weight Bearing Restrictions: No    Mobility  Bed Mobility Overal bed mobility: Independent             General bed mobility comments: pt up in room straightening bed linens  Transfers Overall transfer level: Independent                  Ambulation/Gait Ambulation/Gait assistance: Supervision Ambulation Distance (Feet): 300 Feet Assistive device: None Gait Pattern/deviations: Step-through pattern Gait velocity: decreased   General Gait Details: pt with steady gait    Stairs Stairs: Yes   Stair Management: One rail Right;Step to pattern Number of Stairs: 15 General stair comments: cues for step to pattern  Wheelchair Mobility    Modified Rankin (Stroke Patients Only)       Balance Overall balance assessment: Needs assistance Sitting-balance support: No upper extremity supported Sitting balance-Leahy Scale: Good     Standing balance support: During functional activity Standing balance-Leahy Scale: Good               High level balance activites: Direction  changes;Turns;Head turns;Sudden stops High Level Balance Comments: pt with slight disturbance to gait pattern, mainly to gait speed, however no LOB or assistance to steady required            Cognition Arousal/Alertness: Awake/alert Behavior During Therapy: WFL for tasks assessed/performed Overall Cognitive Status: Within Functional Limits for tasks assessed                                        Exercises      General Comments        Pertinent Vitals/Pain Pain Assessment: No/denies pain Pain Descriptors / Indicators: Discomfort (from staples being removed) Pain Intervention(s): Repositioned;Monitored during session    Home Living                      Prior Function            PT Goals (current goals can now be found in the care plan section) Acute Rehab PT Goals PT Goal Formulation: With patient Time For Goal Achievement: 06/02/16 Potential to Achieve Goals: Good Progress towards PT goals: Progressing toward goals    Frequency    Min 3X/week      PT Plan Discharge plan needs to be updated    Co-evaluation             End of Session Equipment Utilized During Treatment: Gait belt (placed high  on chest) Activity Tolerance: Patient tolerated treatment well Patient left: with call bell/phone within reach;in chair Nurse Communication: Mobility status PT Visit Diagnosis: Unsteadiness on feet (R26.81);Other abnormalities of gait and mobility (R26.89);Pain Pain - part of body:  (abdomen)     Time: 8016-5537 PT Time Calculation (min) (ACUTE ONLY): 22 min  Charges:  $Gait Training: 8-22 mins                    G Codes:       Christopher [name], PTA Pager: [phone]     Darliss Cheney 05/25/2016, 3:50 PM

## 2016-05-26 DIAGNOSIS — C186 Malignant neoplasm of descending colon: Secondary | ICD-10-CM

## 2016-05-26 DIAGNOSIS — K5651 Intestinal adhesions [bands], with partial obstruction: Secondary | ICD-10-CM

## 2016-05-26 HISTORY — DX: Malignant neoplasm of descending colon: C18.6

## 2016-05-26 MED ORDER — PIPERACILLIN-TAZOBACTAM 3.375 G IVPB
3.3750 g | Freq: Three times a day (TID) | INTRAVENOUS | Status: DC
  Administered 2016-05-26 – 2016-05-29 (×9): 3.375 g via INTRAVENOUS
  Filled 2016-05-26 (×10): qty 50

## 2016-05-26 MED ORDER — METHOCARBAMOL 1000 MG/10ML IJ SOLN
1000.0000 mg | Freq: Four times a day (QID) | INTRAMUSCULAR | Status: DC | PRN
  Filled 2016-05-26: qty 10

## 2016-05-26 MED ORDER — ACETAMINOPHEN 500 MG PO TABS
1000.0000 mg | ORAL_TABLET | Freq: Three times a day (TID) | ORAL | Status: DC
  Administered 2016-05-26 – 2016-05-27 (×4): 1000 mg via ORAL
  Administered 2016-05-27: 500 mg via ORAL
  Administered 2016-05-28 – 2016-05-29 (×4): 1000 mg via ORAL
  Filled 2016-05-26 (×10): qty 2

## 2016-05-26 MED ORDER — PSYLLIUM 95 % PO PACK
1.0000 | PACK | Freq: Every day | ORAL | Status: DC
  Administered 2016-05-26 – 2016-05-29 (×2): 1 via ORAL
  Filled 2016-05-26 (×4): qty 1

## 2016-05-26 MED ORDER — MENTHOL 3 MG MT LOZG: 1.0000 | LOZENGE | OROMUCOSAL | Status: DC | PRN

## 2016-05-26 MED ORDER — SACCHAROMYCES BOULARDII 250 MG PO CAPS
250.0000 mg | ORAL_CAPSULE | Freq: Two times a day (BID) | ORAL | Status: DC
  Administered 2016-05-26 – 2016-05-28 (×6): 250 mg via ORAL
  Filled 2016-05-26 (×6): qty 1

## 2016-05-26 MED ORDER — PHENOL 1.4 % MT LIQD: 2.0000 | OROMUCOSAL | Status: DC | PRN

## 2016-05-26 MED ORDER — MAGIC MOUTHWASH: 15.0000 mL | Freq: Four times a day (QID) | ORAL | Status: DC | PRN

## 2016-05-26 MED ORDER — BLISTEX MEDICATED EX OINT
1.0000 "application " | TOPICAL_OINTMENT | Freq: Two times a day (BID) | CUTANEOUS | Status: DC
  Administered 2016-05-26 – 2016-05-29 (×5): 1 via TOPICAL
  Filled 2016-05-26: qty 6.3

## 2016-05-26 MED ORDER — ADULT MULTIVITAMIN W/MINERALS CH
1.0000 | ORAL_TABLET | Freq: Every day | ORAL | Status: DC
  Administered 2016-05-26 – 2016-05-29 (×4): 1 via ORAL
  Filled 2016-05-26 (×4): qty 1

## 2016-05-26 MED ORDER — SODIUM CHLORIDE 0.9% FLUSH
3.0000 mL | Freq: Two times a day (BID) | INTRAVENOUS | Status: DC
  Administered 2016-05-26 – 2016-05-29 (×6): 3 mL via INTRAVENOUS

## 2016-05-26 MED ORDER — BISACODYL 10 MG RE SUPP: 10.0000 mg | Freq: Two times a day (BID) | RECTAL | Status: DC | PRN

## 2016-05-26 MED ORDER — ALUM & MAG HYDROXIDE-SIMETH 200-200-20 MG/5ML PO SUSP: 30.0000 mL | Freq: Four times a day (QID) | ORAL | Status: DC | PRN

## 2016-05-26 MED ORDER — DAKINS (1/4 STRENGTH) 0.125 % EX SOLN
Freq: Two times a day (BID) | CUTANEOUS | Status: AC
  Administered 2016-05-26 – 2016-05-27 (×2)
  Administered 2016-05-27: 1
  Filled 2016-05-26: qty 473

## 2016-05-26 MED ORDER — PANTOPRAZOLE SODIUM 40 MG PO TBEC
40.0000 mg | DELAYED_RELEASE_TABLET | Freq: Two times a day (BID) | ORAL | Status: DC
  Administered 2016-05-26 – 2016-05-29 (×6): 40 mg via ORAL
  Filled 2016-05-26 (×6): qty 1

## 2016-05-26 MED ORDER — SODIUM CHLORIDE 0.9% FLUSH
3.0000 mL | INTRAVENOUS | Status: DC | PRN
Start: 2016-05-26 — End: 2016-05-29

## 2016-05-26 MED ORDER — SODIUM CHLORIDE 0.9 % IV SOLN: 250.0000 mL | INTRAVENOUS | Status: DC | PRN

## 2016-05-26 MED ORDER — METHOCARBAMOL 500 MG PO TABS: 1000.0000 mg | ORAL_TABLET | Freq: Four times a day (QID) | ORAL | Status: DC | PRN

## 2016-05-26 MED ORDER — LACTATED RINGERS IV BOLUS (SEPSIS)
1000.0000 mL | Freq: Three times a day (TID) | INTRAVENOUS | Status: AC | PRN
  Administered 2016-05-27: 1000 mL via INTRAVENOUS

## 2016-05-26 NOTE — Progress Notes (Signed)
Christopher  [name] [place], [place] [address] Phone: [phone] FAX: [phone]   Christopher [name] [id number] 06-27-1960    Problem List:   Principal Problem:   Partial bowel obstruction [facility]) Active Problems:   Adenocarcinoma of descending colon  pT3, pN1a, pMX s/p colectomy/ostomy 4/31/5400   Chronic systolic CHF (congestive heart failure) (HCC)   Hyperlipidemia   AICD (automatic cardioverter/defibrillator) present   Anemia due to blood loss   Adenocarcinoma of colon ([place])   Essential hypertension   8 Days Post-Op  05/18/2016   PATIENT:  Christopher [name]  56 y.o. male  PRE-OPERATIVE DIAGNOSIS:  Obstructing Colon Mass  POST-OPERATIVE DIAGNOSIS:  Obstructing Colon Mass  PROCEDURE:  Procedure(s): LEFT COLECTOMY WITH OSTOMY CREATION/HARTMANN PROCEDURE  SURGEON:  Surgeon(s): [name], MD  FINAL DIAGNOSIS Diagnosis Colon, segmental resection for tumor, Left - INVASIVE COLORECTAL ADENOCARCINOMA, 4.6 CM. - TUMOR EXTENDS INTO SUBSEROSAL CONNECTIVE TISSUE. - MARGINS NOT INVOLVED. - METASTATIC CARCINOMA IN ONE OF TWELVE LYMPH NODES (1/12).  Assessment  Fair  Plan:  -HH diet w supp shakes for malnutrition -colostomy care - WOCN -wound packing - Dakins x 36hr, then NS.  May need debridement but hold off for now -f/u cultures - start IV Zosyn x5 days for now with wound infection -Plavix -CEA level for cancer - will need outpt med onc eval -VTE prophylaxis- SCDs, etc -mobilize as tolerated to help recovery  Adin Hector, M.D., F.A.C.S. Gastrointestinal and Minimally Invasive Surgery [facility] [address], [place], [place] [address] [phone] Main / Paging   05/26/2016  CARE TEAM:  PCP: Minerva Ends, MD  Outpatient Care Team: Patient Care Team: [name], MD as PCP - General (Family Medicine) [name], MD as Consulting Physician  (Cardiology) [name], MD as Consulting Physician (Cardiology)  Inpatient Treatment Team: Treatment Team: Attending Provider: Velvet Bathe, MD; Rounding Team: Md Edison Pace, MD; Registered Nurse: Roetta Sessions, RN; Consulting Physician: [name], MD; Registered Nurse: Roselind Rily, RN; Rounding Team: Minerva Ends, MD; Student Nurse: Zenia Resides, Student-RN; Technician: Gasper Sells, NT; Registered Nurse: Nadara Mode, RN; Registered Nurse: Kennith Center, RN; Consulting Physician: Ambrose Finland, MD; Technician: Nino Glow, NT; Registered Nurse: Candida Peeling, RN; Registered Nurse: Susie Cassette, RN  Subjective:  Tol some food Worried aboput paying bills Small BM out anus  Objective:  Vital signs:  Vitals:   05/25/16 0500 05/25/16 1406 05/25/16 2011 05/26/16 0431  BP:  102/64 97/62 110/64  Pulse:  79 73 74  Resp:  18 18 18   Temp:  98.3 F (36.8 C) 98.5 F (36.9 C) 98.5 F (36.9 C)  TempSrc:  Oral Oral Oral  SpO2:  94% 94% 94%  Weight: 71.7 kg (158 lb)   71.8 kg (158 lb 4.8 oz)  Height:        Last BM Date: 05/26/16  Intake/Output   Yesterday:  04/27 0701 - 04/28 0700 In: 474 [P.O.:474] Out: 770 [Urine:750; Stool:20] This shift:  Total I/O In: -  Out: 220 [Urine:200; Stool:20]  Bowel function:  Flatus: YES  BM:  YES  Drain: (No drain)   Physical Exam:  General: Pt awake/alert/oriented x4 inno acute distress Eyes: PERRL, normal EOM.  Sclera clear.  No icterus Neuro: CN II-XII intact w/o focal sensory/motor deficits. Lymph: No head/neck/groin lymphadenopathy Psych:  No delerium/psychosis/paranoia HENT: Normocephalic, Mucus membranes moist.  No thrush Neck:  Supple, No tracheal deviation Chest: No chest wall pain w good excursion CV:  Pulses intact.  Regular rhythm MS: Normal AROM mjr joints.  No obvious deformity Abdomen: Soft.  Mildy distended.  Nontender.  Colostomy pink w gas/stool.  wound foul  small w some fascial necrosis but no stool/pus.  No evidence of peritonitis.  No incarcerated hernias. Ext:  SCDs BLE.  No mjr edema.  No cyanosis Skin: No petechiae / purpura  Results:   Colon, segmental resection for tumor, Left - INVASIVE COLORECTAL ADENOCARCINOMA, 4.6 CM. - TUMOR EXTENDS INTO SUBSEROSAL CONNECTIVE TISSUE. - MARGINS NOT INVOLVED. - METASTATIC CARCINOMA IN ONE OF TWELVE LYMPH NODES (1/12).  Microscopic Comment COLON AND RECTUM (INCLUDING TRANS-ANAL RESECTION): Specimen: Left colon. Procedure: Segmental resection. Tumor site: Descending colon. Specimen integrity: Intact. Macroscopic intactness of mesorectum: Not applicable. Macroscopic tumor perforation: No Invasive tumor: Maximum size: 4.6 cm Histologic type(s): Colorectal adenocarcinoma Histologic grade and differentiation: G2: moderately differentiated/low grade Type of polyp in which invasive carcinoma arose: No residual polyp Microscopic extension of invasive tumor: Through muscularis propria into subserosal connective tissue. Lymph-Vascular invasion: Present. Peri-neural invasion: No Tumor deposit(s) (discontinuous extramural extension): No Resection margins: Proximal margin: Free of tumor Distal margin: Free of tumor Circumferential (radial) (posterior ascending, posterior descending; lateral and posterior mid-rectum; and entire lower 1/3 rectum): Free of tumor Mesenteric margin (sigmoid and transverse): N/A Distance closest margin (if all above margins negative): 0.5 cm from radial margin Treatment effect (neo-adjuvant therapy): No 1 of 3 FINAL for Christopher [name], Christopher [name] [phone]) Microscopic Comment(continued) Additional polyp(s): No Non-neoplastic findings: N/A Lymph nodes: number examined 12; number positive: 1 Pathologic Staging: pT3, pN1a, pMX Ancillary studies: MMR by IHC and MSI by PCR (JDP:ecj 05/21/2016) Claudette Laws MD Pathologist, Electronic Signature (Case signed  05/21/2016)   Labs: Results for orders placed or performed during the hospital encounter of 05/16/16 (from the past 48 hour(s))  Aerobic Culture (superficial specimen)     Status: None (Preliminary result)   Collection Time: 05/25/16  4:32 PM  Result Value Ref Range   Specimen Description WOUND ABDOMEN    Special Requests NONE    Gram Stain      MODERATE WBC PRESENT, PREDOMINANTLY PMN ABUNDANT GRAM NEGATIVE RODS MODERATE GRAM POSITIVE COCCI IN PAIRS IN CHAINS IN CLUSTERS    Culture CULTURE REINCUBATED FOR BETTER GROWTH    Report Status PENDING     Imaging / Studies: No results found.  Medications / Allergies: per chart  Antibiotics: Anti-infectives    Start     Dose/Rate Route Frequency Ordered Stop   05/18/16 0900  cefoTEtan (CEFOTAN) 2 g in dextrose 5 % 50 mL IVPB     2 g 100 mL/hr over 30 Minutes Intravenous To [facility] 05/18/16 0826 05/18/16 1035   05/18/16 0827  cefoTEtan in Dextrose 5% (CEFOTAN) 2-2.08 GM-% IVPB    Comments:  Laurita Quint   : cabinet override      05/18/16 0827 05/18/16 2029        Note: Portions of this report may have been transcribed using voice recognition software. Every effort was made to ensure accuracy; however, inadvertent computerized transcription errors may be present.   Any transcriptional errors that result from this process are unintentional.     Adin Hector, M.D., F.A.C.S. Gastrointestinal and Minimally Invasive Surgery [facility] [address] [place], [place] [address] [phone] Main / Paging   05/26/2016

## 2016-05-26 NOTE — Progress Notes (Signed)
Pharmacy Antibiotic Note  Christopher [name] is a 56 y.o. male admitted on 05/16/2016 with wound infection.  Pharmacy has been consulted for Zosyn dosing.  Plan: Start Zosyn 3.375 gm IV q8h (4 hour infusion) Monitor clinical picture, renal function F/U C&S, abx deescalation / LOT   Height: 5\' 8"  (172.7 cm) Weight: 158 lb 4.8 oz (71.8 kg) IBW/kg (Calculated) : 68.4  Temp (24hrs), Avg:98.4 F (36.9 C), Min:98.3 F (36.8 C), Max:98.5 F (36.9 C)   Recent Labs Lab 05/20/16 0212 05/21/16 0330 05/22/16 0418 05/23/16 0516 05/24/16 1137  WBC 12.4* 13.1* 7.6 7.2 8.5  CREATININE 1.12 0.98 1.06 1.21  --     Estimated Creatinine Clearance: 68.3 mL/min (by C-G formula based on SCr of 1.21 mg/dL).    Allergies  Allergen Reactions  . Celebrex [Celecoxib] Other (See Comments)    Caused his back to burn  . Contrast Media [Iodinated Diagnostic Agents] Other (See Comments)    Patient feels weakness    Antimicrobials this admission: Zosyn 4/28 >>   Dose adjustments this admission: n/a  Microbiology results: 4/27 Wound Cx: GNRs and GPC in pairs (pending)  Thank you for allowing pharmacy to be a part of this patient's care.  Reginia Naas 05/26/2016 12:23 PM

## 2016-05-26 NOTE — Progress Notes (Signed)
[facility] PROGRESS NOTE  Christopher [name] YOV:[id number] DOB: [birth year] DOA: 05/16/2016  PCP: Minerva Ends, MD  Brief History/Interval Summary: 56 year old Caucasian male with medical history significant of CAD/MI status post cardiac catheterization with stent placement and AICD, CHF, C KD, COPD, hyperlipidemia, tobacco use, presented with complaints of abdominal pain and distention. He underwent colonoscopy and was found to have an obstructing mass in the distal descending colon. [name] was hospitalized for further management. He underwent partial colectomy and colostomy placement.  Reason for Visit: Obstructing mass in the distal descending colon  Consultants: Gastroenterology. General surgery. Cardiology.  Procedures:   Transthoracic echocardiogram Study Conclusions - Left ventricle: The cavity size was mildly dilated. Wall   thickness was normal. Systolic function was severely reduced. The   estimated ejection fraction was in the range of 25% to 30%.   Akinesis of the anteroseptal, anterior, and apical myocardium.   Doppler parameters are consistent with abnormal left ventricular   relaxation (grade 1 diastolic dysfunction).  Partial colectomy, colostomy 4/20  Antibiotics: None  Subjective/Interval History: Pt denies any visual hallucinations today.  ROS: Denies any headaches.  Objective:  Vital Signs  Vitals:   05/25/16 1406 05/25/16 2011 05/26/16 0431 05/26/16 1300  BP: 102/64 97/62 110/64 (!) 86/57  Pulse: 79 73 74 81  Resp: 18 18 18 17   Temp: 98.3 F (36.8 C) 98.5 F (36.9 C) 98.5 F (36.9 C) 97.6 F (36.4 C)  TempSrc: Oral Oral Oral Oral  SpO2: 94% 94% 94% 96%  Weight:   71.8 kg (158 lb 4.8 oz)   Height:        Intake/Output Summary (Last 24 hours) at 05/26/16 1551 Last data filed at 05/26/16 0713  Gross per 24 hour  Intake              354 ml  Output              990 ml  Net             -636 ml   Filed Weights   05/24/16 0457  05/25/16 0500 05/26/16 0431  Weight: 72.9 kg (160 lb 11.5 oz) 71.7 kg (158 lb) 71.8 kg (158 lb 4.8 oz)    General appearance: Awake and alert. In no distress. Resp: Somewhat diminished entry at the bases. Mostly clear to auscultation. No wheezing, rales or rhonchi.  Cardio: S1, S2 is normal, regular. No S3, S4. No rubs, murmurs, or bruit. No pedal edema.  GI: Abdomen is soft.  Bowel sounds are present. No  Guarding. Extremities: No edema Neurologic: No focal deficits  Lab Results:  Data Reviewed: I have personally reviewed following labs and imaging studies  CBC:  Recent Labs Lab 05/20/16 0212 05/21/16 0330 05/22/16 0418 05/22/16 1933 05/23/16 0516 05/24/16 1137  WBC 12.4* 13.1* 7.6  --  7.2 8.5  HGB 9.9* 8.3* 7.3* 9.4* 10.0* 10.1*  HCT 31.5* 26.7* 22.8* 28.5* 30.1* 31.0*  MCV 87.0 87.5 85.4  --  83.6 84.2  PLT 118* 131* 137*  --  152 741    Basic Metabolic Panel:  Recent Labs Lab 05/20/16 0212 05/21/16 0330 05/22/16 0418 05/23/16 0516  NA 138 138 139 139  K 3.9 4.1 3.4* 3.1*  CL 98* 105 104 98*  CO2 31 30 27 25   GLUCOSE 157* 128* 117* 98  BUN 15 24* 14 15  CREATININE 1.12 0.98 1.06 1.21  CALCIUM 8.5* 8.2* 8.2* 8.5*    GFR: Estimated Creatinine Clearance: 68.3 mL/min (by  C-G formula based on SCr of 1.21 mg/dL).   Radiology Studies: No results found.   Medications:  Scheduled: . acetaminophen  1,000 mg Oral TID  . aspirin  81 mg Oral Daily  . atorvastatin  80 mg Oral q1800  . carvedilol  3.125 mg Oral BID WC  . clopidogrel  75 mg Oral Daily  . digoxin  0.125 mg Oral Daily  . feeding supplement (ENSURE ENLIVE)  237 mL Oral TID BM  . lip balm  1 application Topical BID  . lisinopril  2.5 mg Oral Daily  . multivitamin with minerals  1 tablet Oral Daily  . pantoprazole  40 mg Oral BID  . psyllium  1 packet Oral Daily  . saccharomyces boulardii  250 mg Oral BID  . sodium chloride flush  3 mL Intravenous Q12H  . sodium hypochlorite   Irrigation BID     Continuous: . sodium chloride    . lactated ringers    . methocarbamol (ROBAXIN)  IV    . piperacillin-tazobactam (ZOSYN)  IV 3.375 g (05/26/16 1440)   GQQ:PYPPJK chloride, alum & mag hydroxide-simeth, fentaNYL (SUBLIMAZE) injection, lactated ringers, magic mouthwash, menthol-cetylpyridinium, methocarbamol (ROBAXIN)  IV, methocarbamol, nicotine polacrilex, nitroGLYCERIN, ondansetron **OR** [DISCONTINUED] ondansetron (ZOFRAN) IV, oxyCODONE, phenol, promethazine, sodium chloride flush  Assessment/Plan:  Principal Problem:   Partial bowel obstruction (HCC) Active Problems:   Chronic systolic CHF (congestive heart failure) (HCC)   Hyperlipidemia   AICD (automatic cardioverter/defibrillator) present   Anemia due to blood loss   Adenocarcinoma of colon ([place])   Essential hypertension   Adenocarcinoma of descending colon  pT3, pN1a, pMX s/p colectomy/ostomy 05/18/2016    Obstructing cancer in the distal descending colon causing Partial bowel obstruction Pathology shows invasive colorectal adenocarcinoma. Patient will need to be seen by oncology eventually. There was documentation suggesting it was obstructive  General surgery monitoring wound and are concerned about possible wound infection. As such we are observing patient and awaiting clearance for discharge from general surgery.  Visual hallucinations - Psychiatry consulted. Patient refuses any visual hallucinations. States that with the discontinuation of IV pain medication it has resolved.  Acute blood loss anemia/symptomatic anemia   Continue PPI for now.  - HGB stable currently  Chest Pain Cardiology is assiting. Currently stable  History of coronary artery disease/chronic systolic CHF, status post AICD Last cath w/ stent placement 11/2015. Currently well compensated and w/o ACS symptoms. Dr. Wynonia Lawman is following. Echocardiogram shows EF of 25-30%. Continue ASA, lipitor. Continue coreg and digoxin, lisinopril. Monitor fluid  status closely in the postoperative period. Plavix was resumed 4/22 after discussions with Dr. Windle Guard. Plavix will be held for now. Consider any bleeding and anemia.  - stable. - General surgery ok with continuing plavix. And Plavix is currently on board.  Mild AKI Resolved and S creatinine wnl  Tobacco use Nicotine PRN.  DVT Prophylaxis: SCDs Code Status: Full code  Family Communication: Discussed with patient  Disposition Plan: Management as outlined above. Plan is for the patient to go to skilled nursing facility when medically stable.     LOS: 10 days   Christopher [name], [facility] Pager [phone] 05/26/2016, 3:51 PM  If 7PM-7AM, please contact night-coverage at [email or web address], password [facility]

## 2016-05-27 LAB — COMPREHENSIVE METABOLIC PANEL
ALBUMIN: 2.2 g/dL — AB (ref 3.5–5.0)
ALK PHOS: 43 U/L (ref 38–126)
ALT: 31 U/L (ref 17–63)
ANION GAP: 8 (ref 5–15)
AST: 29 U/L (ref 15–41)
BUN: 19 mg/dL (ref 6–20)
CO2: 29 mmol/L (ref 22–32)
Calcium: 8.4 mg/dL — ABNORMAL LOW (ref 8.9–10.3)
Chloride: 104 mmol/L (ref 101–111)
Creatinine, Ser: 1.12 mg/dL (ref 0.61–1.24)
GFR calc Af Amer: >60 mL/min (ref >60)
GFR calc non Af Amer: >60 mL/min (ref >60)
GLUCOSE: 119 mg/dL — AB (ref 65–99)
POTASSIUM: 3.5 mmol/L (ref 3.5–5.1)
SODIUM: 141 mmol/L (ref 135–145)
Total Bilirubin: 0.3 mg/dL (ref 0.3–1.2)
Total Protein: 4.8 g/dL — ABNORMAL LOW (ref 6.5–8.1)

## 2016-05-27 LAB — CBC
HCT: 28.6 % — ABNORMAL LOW (ref 39.0–52.0)
HEMOGLOBIN: 9.4 g/dL — AB (ref 13.0–17.0)
MCH: 28.1 pg (ref 26.0–34.0)
MCHC: 32.9 g/dL (ref 30.0–36.0)
MCV: 85.6 fL (ref 78.0–100.0)
Platelets: 240 10*3/uL (ref 150–400)
RBC: 3.34 MIL/uL — ABNORMAL LOW (ref 4.22–5.81)
RDW: 17.3 % — AB (ref 11.5–15.5)
WBC: 8.7 10*3/uL (ref 4.0–10.5)

## 2016-05-27 LAB — PREALBUMIN: Prealbumin: 14 mg/dL — ABNORMAL LOW (ref 18–38)

## 2016-05-27 MED ORDER — SODIUM CHLORIDE 0.9 % IV BOLUS (SEPSIS)
500.0000 mL | Freq: Once | INTRAVENOUS | Status: AC
  Administered 2016-05-27: 500 mL via INTRAVENOUS

## 2016-05-27 NOTE — Progress Notes (Signed)
Dr. Wendee Beavers notified BP was 88/48 1000 cc LR given BP rechecked 80/60. 500 cc NS ordered.

## 2016-05-27 NOTE — Progress Notes (Signed)
[facility] PROGRESS NOTE  Christopher [name]:[id number] DOB: [birth year] DOA: 05/16/2016  PCP: Minerva Ends, MD  Brief History/Interval Summary: 56 year old Caucasian male with medical history significant of CAD/MI status post cardiac catheterization with stent placement and AICD, CHF, C KD, COPD, hyperlipidemia, tobacco use, presented with complaints of abdominal pain and distention. He underwent colonoscopy and was found to have an obstructing mass in the distal descending colon. [name] was hospitalized for further management. He underwent partial colectomy and colostomy placement.  Reason for Visit: Obstructing mass in the distal descending colon  Consultants: Gastroenterology. General surgery. Cardiology.  Procedures:   Transthoracic echocardiogram Study Conclusions - Left ventricle: The cavity size was mildly dilated. Wall   thickness was normal. Systolic function was severely reduced. The   estimated ejection fraction was in the range of 25% to 30%.   Akinesis of the anteroseptal, anterior, and apical myocardium.   Doppler parameters are consistent with abnormal left ventricular   relaxation (grade 1 diastolic dysfunction).  Partial colectomy, colostomy 4/20  Antibiotics: None  Subjective/Interval History: Pt denies any visual hallucinations today.  ROS: Denies any headaches.  Objective:  Vital Signs  Vitals:   05/26/16 0431 05/26/16 1300 05/26/16 2103 05/27/16 0559  BP: 110/64 (!) 86/57 (!) 95/57 109/67  Pulse: 74 81 67 69  Resp: 18 17 18 18   Temp: 98.5 F (36.9 C) 97.6 F (36.4 C) 98.1 F (36.7 C) 97.9 F (36.6 C)  TempSrc: Oral Oral Oral Oral  SpO2: 94% 96% 96% 96%  Weight: 71.8 kg (158 lb 4.8 oz)     Height:        Intake/Output Summary (Last 24 hours) at 05/27/16 1418 Last data filed at 05/27/16 0548  Gross per 24 hour  Intake              100 ml  Output              630 ml  Net             -530 ml   Filed Weights   05/24/16  0457 05/25/16 0500 05/26/16 0431  Weight: 72.9 kg (160 lb 11.5 oz) 71.7 kg (158 lb) 71.8 kg (158 lb 4.8 oz)    General appearance: Awake and alert. In no distress. Resp: Somewhat diminished entry at the bases. Mostly clear to auscultation. No wheezing, rales or rhonchi.  Cardio: S1, S2 is normal, regular. No S3, S4. No rubs, murmurs, or bruit. No pedal edema.  GI: Abdomen is soft.  Bowel sounds are present. No  Guarding. Extremities: No edema Neurologic: No focal deficits  Lab Results:  Data Reviewed: I have personally reviewed following labs and imaging studies  CBC:  Recent Labs Lab 05/21/16 0330 05/22/16 0418 05/22/16 1933 05/23/16 0516 05/24/16 1137 05/27/16 0408  WBC 13.1* 7.6  --  7.2 8.5 8.7  HGB 8.3* 7.3* 9.4* 10.0* 10.1* 9.4*  HCT 26.7* 22.8* 28.5* 30.1* 31.0* 28.6*  MCV 87.5 85.4  --  83.6 84.2 85.6  PLT 131* 137*  --  152 218 314    Basic Metabolic Panel:  Recent Labs Lab 05/21/16 0330 05/22/16 0418 05/23/16 0516 05/27/16 0408  NA 138 139 139 141  K 4.1 3.4* 3.1* 3.5  CL 105 104 98* 104  CO2 30 27 25 29   GLUCOSE 128* 117* 98 119*  BUN 24* 14 15 19   CREATININE 0.98 1.06 1.21 1.12  CALCIUM 8.2* 8.2* 8.5* 8.4*    GFR: Estimated Creatinine Clearance: 73.8 mL/min (  by C-G formula based on SCr of 1.12 mg/dL).   Radiology Studies: No results found.   Medications:  Scheduled: . acetaminophen  1,000 mg Oral TID  . aspirin  81 mg Oral Daily  . atorvastatin  80 mg Oral q1800  . carvedilol  3.125 mg Oral BID WC  . clopidogrel  75 mg Oral Daily  . digoxin  0.125 mg Oral Daily  . feeding supplement (ENSURE ENLIVE)  237 mL Oral TID BM  . lip balm  1 application Topical BID  . lisinopril  2.5 mg Oral Daily  . multivitamin with minerals  1 tablet Oral Daily  . pantoprazole  40 mg Oral BID  . psyllium  1 packet Oral Daily  . saccharomyces boulardii  250 mg Oral BID  . sodium chloride flush  3 mL Intravenous Q12H  . sodium hypochlorite   Irrigation BID    Continuous: . sodium chloride    . lactated ringers    . methocarbamol (ROBAXIN)  IV    . piperacillin-tazobactam (ZOSYN)  IV 3.375 g (05/27/16 1407)   RUE:AVWUJW chloride, alum & mag hydroxide-simeth, fentaNYL (SUBLIMAZE) injection, lactated ringers, magic mouthwash, menthol-cetylpyridinium, methocarbamol (ROBAXIN)  IV, methocarbamol, nicotine polacrilex, nitroGLYCERIN, ondansetron **OR** [DISCONTINUED] ondansetron (ZOFRAN) IV, oxyCODONE, phenol, promethazine, sodium chloride flush  Assessment/Plan:  Principal Problem:   Partial bowel obstruction (HCC) Active Problems:   Chronic systolic CHF (congestive heart failure) (HCC)   Hyperlipidemia   AICD (automatic cardioverter/defibrillator) present   Anemia due to blood loss   Adenocarcinoma of colon ([place])   Essential hypertension   Adenocarcinoma of descending colon  pT3, pN1a, pMX s/p colectomy/ostomy 05/18/2016    Obstructing cancer in the distal descending colon causing Partial bowel obstruction Pathology shows invasive colorectal adenocarcinoma. Patient will need to be seen by oncology eventually. There was documentation suggesting it was obstructive  General surgery monitoring wound and are concerned about possible wound infection. As such we are observing patient and awaiting clearance for discharge from general surgery.  Visual hallucinations - Psychiatry consulted. Patient refuses any visual hallucinations. States that with the discontinuation of IV pain medication it has resolved.  Acute blood loss anemia/symptomatic anemia   Continue PPI for now.  - HGB stable currently  Chest Pain Cardiology is assiting. Currently stable  History of coronary artery disease/chronic systolic CHF, status post AICD Last cath w/ stent placement 11/2015. Currently well compensated and w/o ACS symptoms. Dr. Wynonia Lawman is following. Echocardiogram shows EF of 25-30%. Continue ASA, lipitor. Continue coreg and digoxin, lisinopril. Monitor fluid  status closely in the postoperative period. Plavix was resumed 4/22 after discussions with Dr. Windle Guard. Plavix will be held for now. Consider any bleeding and anemia.  - stable. - General surgery ok with continuing plavix. And Plavix is currently on board.  Mild AKI Resolved and S creatinine wnl  Tobacco use Nicotine PRN.  DVT Prophylaxis: SCDs Code Status: Full code  Family Communication: Discussed with patient  Disposition Plan: Management as outlined above. Plan is for the patient to go to skilled nursing facility when medically stable.     LOS: 11 days   Idamay Hosein, [facility] Pager [phone] 05/27/2016, 2:18 PM  If 7PM-7AM, please contact night-coverage at [email or web address], password [facility]

## 2016-05-27 NOTE — Progress Notes (Signed)
[facility] Office:  [phone] General Surgery Progress Note   LOS: 11 days  POD -  9 Days Post-Op  Chief Complaint: Abdominal pain  Assessment and Plan: 1.  COLECTOMY WITH OSTOMY CREATION/HARTMANN PROCEDURE - 05/17/2016 - for obstructing cancer  Path - 4.6 cm adenoca, 1/12 nodes positive  CEA pending  Doing well - he's about ready to go home.  Has brothers in town, but does not think that he can stay with them  2.  Open wound  Will start showers today  On Zosyn 3.  Anemia  Hgb - 9.4 - 05/27/2016 4.  AICD 5.  On Plavix 6.  Chronic CHF   Principal Problem:   Partial bowel obstruction (HCC) Active Problems:   Chronic systolic CHF (congestive heart failure) (HCC)   Hyperlipidemia   AICD (automatic cardioverter/defibrillator) present   Anemia due to blood loss   Adenocarcinoma of colon (HCC)   Essential hypertension   Adenocarcinoma of descending colon  pT3, pN1a, pMX s/p colectomy/ostomy 05/18/2016  Subjective:  Alert, doing well.  Wants to go home, but lives by self.  Will get nurses to get him to change his dressing today.    Objective:   Vitals:   05/26/16 2103 05/27/16 0559  BP: (!) 95/57 109/67  Pulse: 67 69  Resp: 18 18  Temp: 98.1 F (36.7 C) 97.9 F (36.6 C)     Intake/Output from previous day:  04/28 0701 - 04/29 0700 In: 100 [IV Piggyback:100] Out: 850 [Urine:750; Stool:100]  Intake/Output this shift:  No intake/output data recorded.   Physical Exam:   General: WN WM who is alert and oriented.    HEENT: Normal. Pupils equal. .   Lungs: Clear   Abdomen: Soft   Wound: Clean.  Ostomy left side of abdomen   Lab Results:    Recent Labs  05/24/16 1137 05/27/16 0408  WBC 8.5 8.7  HGB 10.1* 9.4*  HCT 31.0* 28.6*  PLT 218 240    BMET   Recent Labs  05/27/16 0408  NA 141  K 3.5  CL 104  CO2 29  GLUCOSE 119*  BUN 19  CREATININE 1.12  CALCIUM 8.4*    PT/INR  No results for input(s): LABPROT, INR in the last 72  hours.  ABG  No results for input(s): PHART, HCO3 in the last 72 hours.  Invalid input(s): PCO2, PO2   Studies/Results:  No results found.   Anti-infectives:   Anti-infectives    Start     Dose/Rate Route Frequency Ordered Stop   05/26/16 1400  piperacillin-tazobactam (ZOSYN) IVPB 3.375 g     3.375 g 12.5 mL/hr over 240 Minutes Intravenous Every 8 hours 05/26/16 1222 05/31/16 1359   05/18/16 0900  cefoTEtan (CEFOTAN) 2 g in dextrose 5 % 50 mL IVPB     2 g 100 mL/hr over 30 Minutes Intravenous To ShortStay Surgical 05/18/16 0826 05/18/16 1035   05/18/16 0827  cefoTEtan in Dextrose 5% (CEFOTAN) 2-2.08 GM-% IVPB    Comments:  Laurita Quint   : cabinet override      05/18/16 0827 05/18/16 2029      Alphonsa Overall, MD, FACS Pager: [facility] Office: [phone] 05/27/2016

## 2016-05-27 NOTE — Progress Notes (Signed)
Patient showered and changed dressing with minimal assist. Reviewed ostomy care with patient. Patient cut out colostomy bag. Will request the following nurse observe the above during next dressing change.

## 2016-05-28 LAB — AEROBIC CULTURE  (SUPERFICIAL SPECIMEN)

## 2016-05-28 LAB — AEROBIC CULTURE W GRAM STAIN (SUPERFICIAL SPECIMEN)

## 2016-05-28 LAB — CEA: CEA: 2.8 ng/mL (ref 0.0–4.7)

## 2016-05-28 NOTE — Progress Notes (Signed)
Subjective:  Looks and feels better.  Hallucinations are now gone. Appears less depressed and more accepting of things.   Objective:  Vital Signs in the last 24 hours: BP 111/71 (BP Location: Right Arm)   Pulse 71   Temp 97.9 F (36.6 C) (Oral)   Resp 18   Ht 5\' 8"  (1.727 m)   Wt 73 kg (161 lb)   SpO2 95%   BMI 24.48 kg/m   Physical Exam: Calm appearing Wm in NAD, waling in hall Lungs:  Clear Cardiac:  Regular rhythm, normal S1 and S2, no S3 Extremities:  No edema present  Intake/Output from previous day: 04/29 0701 - 04/30 0700 In: 396 [P.O.:240; I.V.:6; IV Piggyback:150] Out: 560 [Urine:500; Stool:60]  Weight Filed Weights   05/25/16 0500 05/26/16 0431 05/28/16 0451  Weight: 71.7 kg (158 lb) 71.8 kg (158 lb 4.8 oz) 73 kg (161 lb)    Lab Results: Basic Metabolic Panel:  Recent Labs  05/27/16 0408  NA 141  K 3.5  CL 104  CO2 29  GLUCOSE 119*  BUN 19  CREATININE 1.12   CBC:  Recent Labs  05/27/16 0408  WBC 8.7  HGB 9.4*  HCT 28.6*  MCV 85.6  PLT 240   Telemetry: Sinus With occasional PVCs. Personally reviewed  Assessment/Plan:  1. Ischemic cardiomyopathy stable 2. CAD with prior stent 3. Anemia due to blood loss-Stable  4. Recent surgery for obstruction  Recommendation:  Cardi ac status stable and will sign off.  Mentally looks a lot better at this time.l  He is asking questions about followup plans for treatment of his cancer    W. Doristine Church  MD [facility]  05/28/2016, 9:24 AM

## 2016-05-28 NOTE — Progress Notes (Signed)
Physical Therapy Treatment Patient Details Name: Christopher [name] MRN: [id number] DOB: [birth year] Today's Date: 05/28/2016    History of Present Illness Pt is a 56 y/o male presenting to ED on 4/18 with bowel pain and found to have a mass concerning for malignancy. Pt is now s/p L colectomy with colostomy creation in L UQ on 4/20. PMH including but not limited to CAD s/p MI in 2015, AICD placement in 2015, CHF, CKD, and COPD.    PT Comments    Reviewed ascending and descending stairs one last time for pt safety and comfort. Patient able to negociate steps with min guard assist. Patient has met all goals and will be discharging from physical therapy.  Follow Up Recommendations  Home health PT;Supervision - Intermittent     Equipment Recommendations  3in1 (PT)    Recommendations for Other Services       Precautions / Restrictions Precautions Precautions: Fall Restrictions Weight Bearing Restrictions: No    Mobility  Bed Mobility               General bed mobility comments: pt up in chair  Transfers Overall transfer level: Modified independent   Transfers: Sit to/from Stand Sit to Stand: Modified independent (Device/Increase time)            Ambulation/Gait Ambulation/Gait assistance: Modified independent (Device/Increase time) Ambulation Distance (Feet): 200 Feet Assistive device: None Gait Pattern/deviations: Step-through pattern Gait velocity: decreased   General Gait Details: pt with steady gait, one hand on IV pole   Stairs     Stair Management: One rail Right;Alternating pattern Number of Stairs: 14 General stair comments: cues for step to pattern for safety.   Wheelchair Mobility    Modified Rankin (Stroke Patients Only)       Balance Overall balance assessment: Needs assistance Sitting-balance support: No upper extremity supported Sitting balance-Leahy Scale: Good     Standing balance support: During functional activity Standing  balance-Leahy Scale: Good Standing balance comment: pt able to balance in standing without UE support               High Level Balance Comments: No LOB or assist required            Cognition Arousal/Alertness: Awake/alert Behavior During Therapy: WFL for tasks assessed/performed Overall Cognitive Status: Within Functional Limits for tasks assessed                                        Exercises      General Comments        Pertinent Vitals/Pain Pain Assessment: No/denies pain    Home Living                      Prior Function            PT Goals (current goals can now be found in the care plan section) Acute Rehab PT Goals PT Goal Formulation: With patient Time For Goal Achievement: 06/02/16 Potential to Achieve Goals: Good    Frequency    Min 3X/week      PT Plan Current plan remains appropriate    Co-evaluation              AM-PAC PT "6 Clicks" Daily Activity  Outcome Measure  Difficulty turning over in bed (including adjusting bedclothes, sheets and blankets)?: None Difficulty moving from lying on back to sitting on  the side of the bed? : None Difficulty sitting down on and standing up from a chair with arms (e.g., wheelchair, bedside commode, etc,.)?: None Help needed moving to and from a bed to chair (including a wheelchair)?: None Help needed walking in hospital room?: None Help needed climbing 3-5 steps with a railing? : A Little 6 Click Score: 23    End of Session Equipment Utilized During Treatment: Gait belt (placed high on chest) Activity Tolerance: Patient tolerated treatment well Patient left: with call bell/phone within reach;in chair   PT Visit Diagnosis: Unsteadiness on feet (R26.81);Other abnormalities of gait and mobility (R26.89);Pain     Time: 3276-1470 PT Time Calculation (min) (ACUTE ONLY): 12 min  Charges:  $Gait Training: 8-22 mins                    G Codes:       Benjiman Core, PTA Acute Rehab [place] 05/28/2016, 3:53 PM

## 2016-05-28 NOTE — Care Management Note (Signed)
Case Management Note  Patient Details  Name: Christopher [name] MRN: [id number] Date of Birth: 1960/10/24  Subjective/Objective:                    Action/Plan: Discussed discharge planning with bedside nurse, MD, and patient. Patient ambulating independent , NCM was told no need for HHPT.   MD will order [facility] for dressing changes and new colostomy care. Patient has been shown both by bedside nurses also. Confirmed face sheet information with patient. Patient prefers cell phone be used not home phone. Choice offered , patient would like [facility]. Referral given to [facility] with [facility] ( cell number given).   Will need HHRN order and face to face . Dr Wendee Beavers aware.  Expected Discharge Date:                  Expected Discharge Plan:  [place]  In-House Referral:     Discharge planning Services  CM Consult  Post Acute Care Choice:  Home Health Choice offered to:  Patient  DME Arranged:    DME Agency:     HH Arranged:  RN [place] Agency:  [place]  Status of Service:  Completed, signed off  If discussed at [place] of Stay Meetings, dates discussed:    Additional Comments:  Marilu Favre, RN 05/28/2016, 10:15 AM

## 2016-05-28 NOTE — Progress Notes (Signed)
[facility] Progress Note  10 Days Post-Op  Subjective: CC: S/P Colectomy and colostomy Denies pain. Performing wound and ostomy care independently. Mobilizing independently. Having bowel function and tolerating PO.  VSS Objective: Vital signs in last 24 hours: Temp:  [97.9 F (36.6 C)-98.2 F (36.8 C)] 97.9 F (36.6 C) (04/30 0451) Pulse Rate:  [65-74] 71 (04/30 0451) Resp:  [17-18] 18 (04/30 0451) BP: (80-111)/(48-71) 111/71 (04/30 0451) SpO2:  [93 %-96 %] 95 % (04/30 0451) Weight:  [73 kg (161 lb)] 73 kg (161 lb) (04/30 0451) Last BM Date: 05/27/16  Intake/Output from previous day: 04/29 0701 - 04/30 0700 In: 396 [P.O.:240; I.V.:6; IV Piggyback:150] Out: 560 [Urine:500; Stool:60] Intake/Output this shift: Total I/O In: 222 [P.O.:222] Out: -   PE: Gen:  Alert, NAD, pleasant sitting up in recliner  Card:  Regular rate and rhythm, pedal pulses 2+ BL Pulm:  Normal effort, clear to auscultation bilaterally Abd: Soft, non-tender, non-distended, bowel sounds present in all 4 quadrants, no HSM, midline wound c/d/i Skin: warm and dry, no rashes  Psych: A&Ox3   Lab Results:   Recent Labs  05/27/16 0408  WBC 8.7  HGB 9.4*  HCT 28.6*  PLT 240   BMET  Recent Labs  05/27/16 0408  NA 141  K 3.5  CL 104  CO2 29  GLUCOSE 119*  BUN 19  CREATININE 1.12  CALCIUM 8.4*   PT/INR No results for input(s): LABPROT, INR in the last 72 hours. CMP     Component Value Date/Time   NA 141 05/27/2016 0408   K 3.5 05/27/2016 0408   CL 104 05/27/2016 0408   CO2 29 05/27/2016 0408   GLUCOSE 119 (H) 05/27/2016 0408   BUN 19 05/27/2016 0408   CREATININE 1.12 05/27/2016 0408   CREATININE 1.13 02/09/2015 0942   CALCIUM 8.4 (L) 05/27/2016 0408   PROT 4.8 (L) 05/27/2016 0408   ALBUMIN 2.2 (L) 05/27/2016 0408   AST 29 05/27/2016 0408   ALT 31 05/27/2016 0408   ALKPHOS 43 05/27/2016 0408   BILITOT 0.3 05/27/2016 0408   GFRNONAA >60 05/27/2016 0408   GFRAA >60  05/27/2016 0408   Lipase  No results found for: LIPASE     Studies/Results: No results found.  Anti-infectives: Anti-infectives    Start     Dose/Rate Route Frequency Ordered Stop   05/26/16 1400  piperacillin-tazobactam (ZOSYN) IVPB 3.375 g     3.375 g 12.5 mL/hr over 240 Minutes Intravenous Every 8 hours 05/26/16 1222 05/31/16 1359   05/18/16 0900  cefoTEtan (CEFOTAN) 2 g in dextrose 5 % 50 mL IVPB     2 g 100 mL/hr over 30 Minutes Intravenous To ShortStay Surgical 05/18/16 0826 05/18/16 1035   05/18/16 0827  cefoTEtan in Dextrose 5% (CEFOTAN) 2-2.08 GM-% IVPB    Comments:  Laurita Quint   : cabinet override      05/18/16 0827 05/18/16 2029       Assessment/Plan S/p Procedure(s): COLECTOMY WITH OSTOMY CREATION/HARTMANN PROCEDURE (Left)05/18/16 Dr. [name]  - Adenocarcinoma with 1/12 nodes positive, recommend Oncology consult for OP f/u; CEA pending - continue wet-to-dry dressing changes to abdominal wound. Cultures growing staph epidermis. On IV abx. Recommend continuing PO abx at discharge to complete a total of 7 days.  - patient will require [facility] RN for wound and ostomy care. Wound care instructions provided.  - tolerating PO and having bowel function - PT/OT. Currently PT recommending HH PT.    FEN - HH diet, ensure  ID - Zosyn 4/28 >> , no leukocytosis   Patient is stable for discharge from a surgical standpoint w/ PO abx as above and HH RN/PT. D/C instructions and surgical follow-up info provided.    LOS: 12 days    Jill Alexanders , [facility] 05/28/2016, 10:39 AM Pager: [phone] Consults: [phone] Mon-Fri 7:00 am-4:30 pm Sat-Sun 7:00 am-11:30 am

## 2016-05-28 NOTE — Progress Notes (Signed)
Physical Therapy Discharge Patient Details Name: Christopher [name] MRN: [id number] DOB: Dec 10, 1960 Today's Date: 05/28/2016 Time: 2585-2778 PT Time Calculation (min) (ACUTE ONLY): 12 min  Patient discharged from PT services secondary to goals met and no further PT needs identified.  Please see latest therapy progress note for current level of functioning and progress toward goals.    Progress and discharge plan discussed with patient and/or caregiver: Patient/Caregiver agrees with plan   GP     Benjiman Core, PTA Acute Rehab [place] 05/28/2016, 3:59 PM

## 2016-05-28 NOTE — Progress Notes (Signed)
[facility] PROGRESS NOTE  Christopher [name] PPI:[id number] DOB: 09/30/1960 DOA: 05/16/2016  PCP: Minerva Ends, MD  Brief History/Interval Summary: 56 year old Caucasian male with medical history significant of CAD/MI status post cardiac catheterization with stent placement and AICD, CHF, C KD, COPD, hyperlipidemia, tobacco use, presented with complaints of abdominal pain and distention. He underwent colonoscopy and was found to have an obstructing mass in the distal descending colon. [name] was hospitalized for further management. He underwent partial colectomy and colostomy placement.  Reason for Visit: Obstructing mass in the distal descending colon  Consultants: Gastroenterology. General surgery. Cardiology.  Procedures:   Transthoracic echocardiogram Study Conclusions - Left ventricle: The cavity size was mildly dilated. Wall   thickness was normal. Systolic function was severely reduced. The   estimated ejection fraction was in the range of 25% to 30%.   Akinesis of the anteroseptal, anterior, and apical myocardium.   Doppler parameters are consistent with abnormal left ventricular   relaxation (grade 1 diastolic dysfunction).  Partial colectomy, colostomy 4/20  Antibiotics: None  Subjective/Interval History: Pt denies any visual hallucinations today.  ROS: Denies any headaches.  Objective:  Vital Signs  Vitals:   05/27/16 1846 05/27/16 2036 05/28/16 0451 05/28/16 1347  BP: 96/63 (!) 102/58 111/71 109/66  Pulse: 68 65 71 81  Resp:  18 18 18   Temp:  98.2 F (36.8 C) 97.9 F (36.6 C) 98.2 F (36.8 C)  TempSrc:  Oral Oral Oral  SpO2:  96% 95% 99%  Weight:   73 kg (161 lb)   Height:        Intake/Output Summary (Last 24 hours) at 05/28/16 1750 Last data filed at 05/28/16 0900  Gross per 24 hour  Intake              618 ml  Output              310 ml  Net              308 ml   Filed Weights   05/25/16 0500 05/26/16 0431 05/28/16 0451    Weight: 71.7 kg (158 lb) 71.8 kg (158 lb 4.8 oz) 73 kg (161 lb)    General appearance: Awake and alert. In no distress. Resp: Somewhat diminished entry at the bases. Mostly clear to auscultation. No wheezing, rales or rhonchi.  Cardio: S1, S2 is normal, regular. No S3, S4. No rubs, murmurs, or bruit. No pedal edema.  GI: Abdomen is soft.  Bowel sounds are present. No  Guarding. Extremities: No edema Neurologic: No focal deficits  Lab Results:  Data Reviewed: I have personally reviewed following labs and imaging studies  CBC:  Recent Labs Lab 05/22/16 0418 05/22/16 1933 05/23/16 0516 05/24/16 1137 05/27/16 0408  WBC 7.6  --  7.2 8.5 8.7  HGB 7.3* 9.4* 10.0* 10.1* 9.4*  HCT 22.8* 28.5* 30.1* 31.0* 28.6*  MCV 85.4  --  83.6 84.2 85.6  PLT 137*  --  152 218 063    Basic Metabolic Panel:  Recent Labs Lab 05/22/16 0418 05/23/16 0516 05/27/16 0408  NA 139 139 141  K 3.4* 3.1* 3.5  CL 104 98* 104  CO2 27 25 29   GLUCOSE 117* 98 119*  BUN 14 15 19   CREATININE 1.06 1.21 1.12  CALCIUM 8.2* 8.5* 8.4*    GFR: Estimated Creatinine Clearance: 73.8 mL/min (by C-G formula based on SCr of 1.12 mg/dL).   Radiology Studies: No results found.   Medications:  Scheduled: . acetaminophen  1,000 mg Oral TID  . aspirin  81 mg Oral Daily  . atorvastatin  80 mg Oral q1800  . carvedilol  3.125 mg Oral BID WC  . clopidogrel  75 mg Oral Daily  . digoxin  0.125 mg Oral Daily  . feeding supplement (ENSURE ENLIVE)  237 mL Oral TID BM  . lip balm  1 application Topical BID  . lisinopril  2.5 mg Oral Daily  . multivitamin with minerals  1 tablet Oral Daily  . pantoprazole  40 mg Oral BID  . psyllium  1 packet Oral Daily  . saccharomyces boulardii  250 mg Oral BID  . sodium chloride flush  3 mL Intravenous Q12H   Continuous: . sodium chloride    . methocarbamol (ROBAXIN)  IV    . piperacillin-tazobactam (ZOSYN)  IV 3.375 g (05/28/16 1401)   TRV:UYEBXI chloride, alum & mag  hydroxide-simeth, fentaNYL (SUBLIMAZE) injection, magic mouthwash, menthol-cetylpyridinium, methocarbamol (ROBAXIN)  IV, methocarbamol, nicotine polacrilex, nitroGLYCERIN, ondansetron **OR** [DISCONTINUED] ondansetron (ZOFRAN) IV, oxyCODONE, phenol, promethazine, sodium chloride flush  Assessment/Plan:  Principal Problem:   Partial bowel obstruction (HCC) Active Problems:   Chronic systolic CHF (congestive heart failure) (HCC)   Hyperlipidemia   AICD (automatic cardioverter/defibrillator) present   Anemia due to blood loss   Adenocarcinoma of colon (HCC)   Essential hypertension   Adenocarcinoma of descending colon  pT3, pN1a, pMX s/p colectomy/ostomy 05/18/2016    Obstructing cancer in the distal descending colon causing Partial bowel obstruction Pathology shows invasive colorectal adenocarcinoma. Patient will need to be seen by oncology eventually. There was documentation suggesting it was obstructive  General surgery monitoring wound but feel comfortable discharging.  Pt currently would like to go home or with brothers. Has requested another day for discharge planning.   Visual hallucinations - Psychiatry consulted. Patient refuses any visual hallucinations. States that with the discontinuation of IV pain medication it has resolved.  Acute blood loss anemia/symptomatic anemia   Continue PPI for now.  - HGB stable currently  Chest Pain Cardiology is assisted and has signed off. Currently stable  History of coronary artery disease/chronic systolic CHF, status post AICD Last cath w/ stent placement 11/2015. Currently well compensated and w/o ACS symptoms. Dr. Wynonia Lawman is following. Echocardiogram shows EF of 25-30%. Continue ASA, lipitor. Continue coreg and digoxin, lisinopril. Monitor fluid status closely in the postoperative period. Plavix was resumed 4/22 after discussions with Dr. Windle Guard. Plavix will be held for now. Consider any bleeding and anemia.  - stable. - General  surgery ok with continuing plavix. And Plavix is currently on board.  Mild AKI Resolved and S creatinine wnl  Tobacco use Nicotine PRN.  DVT Prophylaxis: SCDs Code Status: Full code  Family Communication: Discussed with patient  Disposition Plan: discharge to home next am.    LOS: 12 days   Davyd Podgorski, [facility] Pager [phone] 05/28/2016, 5:50 PM  If 7PM-7AM, please contact night-coverage at [email or web address], password [facility]

## 2016-05-28 NOTE — Clinical Social Work Note (Signed)
P/T now recommending home with home health as pt is progressing well toward mobility goals. Plan is now for pt to d/c home when medically stable. RNCM aware and following for d/c needs. CSW signing off as no further SW needs identified. Please reconsult If new SW needs arise.   Oretha Ellis, [place], [place] Work [phone]

## 2016-05-28 NOTE — Consult Note (Addendum)
[place] Nurse wound follow up Surgical team following for assessment and plan of care to abd full thickness post-op wound.  Dr Johney Maine ordered Dakins dressing changes BID throughout the weekend and today pt has resumed receiving saline-moistened gauze packing BID by the bedside nurses.  [place] Nurse ostomy follow up Pt states he has been emptying the pouch without assistance and feels he is able to change the pouch.  He declines offer of pouch change demonstration today and states he assisted with performing over the weekend.  He practiced cutting a pouch this am and reviewed pouching routines and ordering supplies.  Stoma type/location: Stoma red and viable, 1 3/4 inches, slightly above skin level Output: Mod amt semiformed brown stool Ostomy pouching: 1pc.  There are 4 extra pouches and barrier rings at bedside and educational materials are in the room. Pt denies further questions. Enrolled patient in [place] Start Discharge program: Yes Julien Girt MSN, RN, [place], [place], [place]

## 2016-05-29 MED ORDER — SACCHAROMYCES BOULARDII 250 MG PO CAPS
250.0000 mg | ORAL_CAPSULE | Freq: Two times a day (BID) | ORAL | 0 refills | Status: DC
Start: 2016-05-29 — End: 2023-01-01

## 2016-05-29 MED ORDER — CARVEDILOL 3.125 MG PO TABS: 3.1250 mg | ORAL_TABLET | Freq: Two times a day (BID) | ORAL | 0 refills | Status: DC

## 2016-05-29 MED ORDER — ADULT MULTIVITAMIN W/MINERALS CH: 1.0000 | ORAL_TABLET | Freq: Every day | ORAL | 0 refills | Status: AC

## 2016-05-29 MED ORDER — ENSURE ENLIVE PO LIQD: 237.0000 mL | Freq: Three times a day (TID) | ORAL | 12 refills | Status: DC

## 2016-05-29 MED ORDER — NITROGLYCERIN 0.4 MG SL SUBL: 0.4000 mg | SUBLINGUAL_TABLET | SUBLINGUAL | 0 refills | Status: DC | PRN

## 2016-05-29 MED ORDER — PANTOPRAZOLE SODIUM 40 MG PO TBEC: 40.0000 mg | DELAYED_RELEASE_TABLET | Freq: Two times a day (BID) | ORAL | 0 refills | Status: DC

## 2016-05-29 NOTE — Progress Notes (Signed)
11 Days Post-Op  Subjective: Cc:  Abdominal pain with colon mass.  He is up and taking diet well.  Open abdomen is stable.  I cleaned it up some.  He has some protein malnutrition that is an issue.  Open wound sutures are loose.  Site is clean and hopefully with dressing changes and better nutrition he will improve.  He says he is going home later today and has home health set up.    Objective: Vital signs in last 24 hours: Temp:  [98 F (36.7 C)-98.4 F (36.9 C)] 98 F (36.7 C) (05/01 0421) Pulse Rate:  [81-83] 83 (05/01 0421) Resp:  [18-21] 21 (05/01 0421) BP: (90-112)/(50-73) 112/73 (05/01 0421) SpO2:  [99 %] 99 % (05/01 0421) Weight:  [73.7 kg (162 lb 6.4 oz)] 73.7 kg (162 lb 6.4 oz) (05/01 0500) Last BM Date: 05/27/16 462 by mouth 150 IV 300 urine recorded 150 stool recorded. Afebrile vital signs are stable Labs are stable Prealbumin 14  Intake/Output from previous day: 04/30 0701 - 05/01 0700 In: 615 [P.O.:462; I.V.:3; IV Piggyback:150] Out: 450 [Urine:300; Stool:150] Intake/Output this shift: Total I/O In: 240 [P.O.:240] Out: 200 [Urine:200]  General appearance: alert, cooperative and no distress Resp: clear to auscultation bilaterally GI: soft, ostomy looks fine, open wound is clean, sutures are loose. Positive bowel sounds, functional ostomy.  Lab Results:   Recent Labs  05/27/16 0408  WBC 8.7  HGB 9.4*  HCT 28.6*  PLT 240    BMET  Recent Labs  05/27/16 0408  NA 141  K 3.5  CL 104  CO2 29  GLUCOSE 119*  BUN 19  CREATININE 1.12  CALCIUM 8.4*   PT/INR No results for input(s): LABPROT, INR in the last 72 hours.   Recent Labs Lab 05/27/16 0408  AST 29  ALT 31  ALKPHOS 43  BILITOT 0.3  PROT 4.8*  ALBUMIN 2.2*     Lipase  No results found for: LIPASE   Studies/Results: No results found.  Medications: . acetaminophen  1,000 mg Oral TID  . aspirin  81 mg Oral Daily  . atorvastatin  80 mg Oral q1800  . carvedilol  3.125 mg Oral  BID WC  . clopidogrel  75 mg Oral Daily  . digoxin  0.125 mg Oral Daily  . feeding supplement (ENSURE ENLIVE)  237 mL Oral TID BM  . lip balm  1 application Topical BID  . lisinopril  2.5 mg Oral Daily  . multivitamin with minerals  1 tablet Oral Daily  . pantoprazole  40 mg Oral BID  . psyllium  1 packet Oral Daily  . saccharomyces boulardii  250 mg Oral BID  . sodium chloride flush  3 mL Intravenous Q12H   . sodium chloride    . methocarbamol (ROBAXIN)  IV    . piperacillin-tazobactam (ZOSYN)  IV Stopped (05/29/16 0920)   Colon, segmental resection for tumor, Left - INVASIVE COLORECTAL ADENOCARCINOMA, 4.6 CM. - TUMOR EXTENDS INTO SUBSEROSAL CONNECTIVE TISSUE. - MARGINS NOT INVOLVED. - METASTATIC CARCINOMA IN ONE OF TWELVE LYMPH NODES (1/12).  Assessment/Plan Obstructing descending colon mass Status post colonoscopy, 05/16/16, Dr. Wilfrid Lund Melena S/p left colectomy with colostomy creation/Hartmann procedure 05/18/16, Dr. [name] CAD/MI with stent placements Cardiomyopathy with EF 84-16%, grade 1 diastolic dysfunction/status post AICD Chronic kidney disease Hypertension Malnutrition - prealbumin 14 FEN:  Heart healthy diet ID: Zosyn 4/28 =>> day 4 DVT:  Plavix/SCD  Plan: He is totally plan to go home today. There was a  note in there suggesting is going to [facility], but he tells me he's going home with home health to assist. I've asked the dietitian to see him and give him recommendations for his diet at home. He does his own cooking. He lives alone. Our office will call with follow-up appointment with Dr. [name] in about 3 weeks. Continue wet-to-dry dressings twice a day. Postop instructions are in the AVS.    LOS: 13 days    Aria Jarrard 05/29/2016 [phone]

## 2016-05-29 NOTE — Care Management Important Message (Signed)
Important Message  Patient Details  Name: Christopher [name] MRN: [id number] Date of Birth: March 21, 1960   Medicare Important Message Given:  Yes    Adela Esteban 05/29/2016, 1:45 PM

## 2016-05-29 NOTE — Discharge Summary (Signed)
Physician Discharge Summary  Christopher [name] VZC:[id number] DOB: 1960/05/26 DOA: 05/16/2016  PCP: Minerva Ends, MD  Admit date: 05/16/2016 Discharge date: 05/29/2016  Time spent: > 35 minutes  Recommendations for Outpatient Follow-up:  1. Ensure patient f/u with general surgeon 2. Monitor hgb levels and reassess wbc levels 3. Ensure oncology follow up   Discharge Diagnoses:  Principal Problem:   Partial bowel obstruction (HCC) Active Problems:   Chronic systolic CHF (congestive heart failure) (HCC)   Hyperlipidemia   AICD (automatic cardioverter/defibrillator) present   Anemia due to blood loss   Adenocarcinoma of colon (HCC)   Essential hypertension   Adenocarcinoma of descending colon  pT3, pN1a, pMX s/p colectomy/ostomy 05/18/2016   Discharge Condition: stable  Diet recommendation:   Filed Weights   05/26/16 0431 05/28/16 0451 05/29/16 0500  Weight: 71.8 kg (158 lb 4.8 oz) 73 kg (161 lb) 73.7 kg (162 lb 6.4 oz)    History of present illness:  56 year old Caucasian male with medical history significant of CAD/MI status post cardiac catheterization with stent placement and AICD, CHF, C KD, COPD, hyperlipidemia, tobacco use, presented with complaints of abdominal pain and distention. He underwent colonoscopy and was found to have an obstructing mass in the distal descending colon. [name] was hospitalized for further management. He underwent partial colectomy and colostomy placement.  Hospital Course:   Obstructing cancer in the distal descending colon causing Partial bowel obstruction Pathology shows invasive colorectal adenocarcinoma. Patient will need to be seen by oncology as outpatient. There was documentation suggesting it was obstructive  General surgery monitoring wound but feel comfortable discharging.  Pt currently would like to go home or with brothers. Has requested another day for discharge planning.   Visual hallucinations - Psychiatry consulted.  Patient refuses any visual hallucinations. States that with the discontinuation of IV pain medication it has resolved.  Acute blood loss anemia/symptomatic anemia   Continue PPI for now.  - HGB stable currently  Chest Pain Cardiology has assisted and has signed off. Currently stable  History of coronary artery disease/chronic systolic CHF, status post AICD Last cath w/ stent placement 11/2015. Currently well compensated and w/o ACS symptoms. Dr. Wynonia Lawman is following. Echocardiogram shows EF of 25-30%. Continue ASA, lipitor. Continue coreg and digoxin, lisinopril. Monitor fluid status closely in the postoperative period. Plavix was resumed 4/22 after discussions with Dr. Windle Guard. Plavix will be held for now. Consider any bleeding and anemia.  - stable. - General surgery ok with continuing plavix. D/c on plavix  Mild AKI Resolved and S creatinine wnl  Tobacco use Recommend alcohol cessation  Procedures:  Please see above  Consultations:  General surgery  Cardiology  Discharge Exam: Vitals:   05/28/16 2000 05/29/16 0421  BP: (!) 90/50 112/73  Pulse: 82 83  Resp: 19 (!) 21  Temp: 98.4 F (36.9 C) 98 F (36.7 C)    General: Pt in nad, alert and awake Cardiovascular: rrr, no rubs Respiratory: no increased wob, no wheezes  Discharge Instructions   Discharge Instructions    Call MD for:  extreme fatigue    Complete by:  As directed    Call MD for:  persistant nausea and vomiting    Complete by:  As directed    Call MD for:  severe uncontrolled pain    Complete by:  As directed    Call MD for:  temperature >100.4    Complete by:  As directed    Diet - low sodium heart healthy  Complete by:  As directed    Discharge instructions    Complete by:  As directed    Please follow up with your surgeon Dr. [name] in 3 weeks   Increase activity slowly    Complete by:  As directed      Current Discharge Medication List    START taking these medications    Details  feeding supplement, ENSURE ENLIVE, (ENSURE ENLIVE) LIQD Take 237 mLs by mouth 3 (three) times daily between meals. Qty: 237 mL, Refills: 12    Multiple Vitamin (MULTIVITAMIN WITH MINERALS) TABS tablet Take 1 tablet by mouth daily. Qty: 30 tablet, Refills: 0    nitroGLYCERIN (NITROSTAT) 0.4 MG SL tablet Place 1 tablet (0.4 mg total) under the tongue every 5 (five) minutes as needed for chest pain. Qty: 20 tablet, Refills: 0    pantoprazole (PROTONIX) 40 MG tablet Take 1 tablet (40 mg total) by mouth 2 (two) times daily. Qty: 60 tablet, Refills: 0    saccharomyces boulardii (FLORASTOR) 250 MG capsule Take 1 capsule (250 mg total) by mouth 2 (two) times daily. Qty: 30 capsule, Refills: 0      CONTINUE these medications which have CHANGED   Details  carvedilol (COREG) 3.125 MG tablet Take 1 tablet (3.125 mg total) by mouth 2 (two) times daily with a meal. Qty: 60 tablet, Refills: 0      CONTINUE these medications which have NOT CHANGED   Details  aspirin 81 MG chewable tablet Chew 1 tablet (81 mg total) by mouth daily. Qty: 30 tablet, Refills: 5    atorvastatin (LIPITOR) 80 MG tablet Take 1 tablet (80 mg total) by mouth daily at 6 PM. Qty: 30 tablet, Refills: 5    digoxin (LANOXIN) 0.125 MG tablet Take 1 tablet (0.125 mg total) by mouth daily. Qty: 30 tablet, Refills: 5    ferrous sulfate 325 (65 FE) MG tablet Take 1 tablet (325 mg total) by mouth 2 (two) times daily with a meal. Qty: 60 tablet, Refills: 3   Associated Diagnoses: Anemia, unspecified type    lisinopril (PRINIVIL,ZESTRIL) 2.5 MG tablet Take 1 tablet (2.5 mg total) by mouth daily. Qty: 30 tablet, Refills: 5   Associated Diagnoses: Chronic systolic CHF (congestive heart failure) (HCC)    Na Sulfate-K Sulfate-Mg Sulf 17.5-3.13-1.6 GM/180ML SOLN Suprep-Use as directed Qty: 354 mL, Refills: 0    clopidogrel (PLAVIX) 75 MG tablet Take 75 mg by mouth daily.       Allergies  Allergen Reactions  .  Celebrex [Celecoxib] Other (See Comments)    Caused his back to burn  . Contrast Media [Iodinated Diagnostic Agents] Other (See Comments)    Patient feels weakness    Contact information for follow-up providers    THOMPSON,BURKE E, MD. Schedule an appointment as soon as possible for a visit on 06/27/2016.   Specialty:  General Surgery Why:  Your appointment is at 11 AM, be at the office 30 minutes early for check in.  Bring photo ID and insurance information. Contact information: [address] [place] [address] [phone]            Contact information for after-discharge care    Destination    [facility] Follow up.   Specialty:  Skilled Nursing Facility Contact information: [place] [phone]                   The results of significant diagnostics from this  hospitalization (including imaging, microbiology, ancillary and laboratory) are listed below for reference.    Significant Diagnostic Studies: Dg Abd 1 View  Result Date: 05/20/2016 CLINICAL DATA:  Pain and distention EXAM: ABDOMEN - 1 VIEW COMPARISON:  CT 05/09/2016, KUB 05/17/2016 FINDINGS: Surgical staples over the midline abdomen. Mild diffuse gaseous dilatation of small and large bowel. Calcified phleboliths in the right pelvis. Decreased: Distension compared to prior radiograph. IMPRESSION: Decreased colonic distention compared to previous. Mild diffuse gaseous dilatation of small and large bowel could be secondary to an ileus. Electronically Signed   By: Donavan Foil M.D.   On: 05/20/2016 01:57   Dg Abd 1 View  Result Date: 05/17/2016 CLINICAL DATA:  History of colonic malignancy, bowel obstruction. EXAM: ABDOMEN - 1 VIEW COMPARISON:  Abdominal and pelvic CT scan of May 09, 2016 FINDINGS: There is moderate gaseous distention of the transverse and proximal descending colon. There is a moderate amount of gas within the  cecum. The rectosigmoid exhibits only a small amount of gas. No small bowel obstructive pattern is observed. No free extraluminal gas collections are demonstrated. IMPRESSION: Persistent moderate gaseous distention of the colon with transition zone in the mid descending colon. No high-grade obstruction. No evidence of perforation. Electronically Signed   By: David  Martinique M.D.   On: 05/17/2016 07:51   Ct Chest W Contrast  Result Date: 05/17/2016 CLINICAL DATA:  Mass lesion identified in the descending colon on CT abdomen and pelvis 05/09/2016. Staging examination. EXAM: CT CHEST WITH CONTRAST TECHNIQUE: Multidetector CT imaging of the chest was performed during intravenous contrast administration. CONTRAST:  100 ml ISOVUE-300 IOPAMIDOL (ISOVUE-300) INJECTION 61% COMPARISON:  PA and lateral chest 12/27/2014. CT abdomen and pelvis 05/09/2016. FINDINGS: Cardiovascular: There is mild cardiomegaly. No pericardial effusion. Calcific aortic and coronary atherosclerosis is identified. Stent in the LAD is noted. Mediastinum/Nodes: No enlarged mediastinal, hilar, or axillary lymph nodes. Thyroid gland, trachea, and esophagus demonstrate no significant findings. Lungs/Pleura: No pleural effusion. The pulmonary nodule, mass or consolidative process. Minimal dependent atelectasis is seen. Centrilobular emphysematous disease is identified. Upper Abdomen: There is marked distention of the transverse colon which is worse than on the prior exam. Scarring upper pole left kidney is noted. Cyst in the upper pole of the right kidney is identified. Musculoskeletal: No acute abnormality. No lytic or sclerotic lesion. IMPRESSION: Negative for metastatic or acute abnormality in the chest. Cardiomegaly. Calcific aortic and coronary atherosclerosis. Coronary artery stent is noted. Centrilobular emphysema. Worsened gaseous distention of the transverse colon since the comparison CT abdomen and pelvis. Electronically Signed   By: Inge Rise M.D.   On: 05/17/2016 10:55   Ct Abdomen Pelvis W Contrast  Result Date: 05/09/2016 CLINICAL DATA:  Abdominal pain.  Chronic lower GI bleeding. EXAM: CT ABDOMEN AND PELVIS WITH CONTRAST TECHNIQUE: Multidetector CT imaging of the abdomen and pelvis was performed using the standard protocol following bolus administration of intravenous contrast. CONTRAST:  157mL ISOVUE-300 IOPAMIDOL (ISOVUE-300) INJECTION 61% COMPARISON:  None. FINDINGS: Lower chest: Dilated left ventricle with fibrofatty scarring and thinning along the lower septum and apex. Right ventricular pacer lead. Hepatobiliary: No evidence of metastatic diseaseNo evidence of biliary obstruction or stone. Pancreas: Unremarkable. Spleen: Unremarkable. Adrenals/Urinary Tract: Negative adrenals. No hydronephrosis or stone. Bilateral simple appearing renal cysts. Renal cortical scarring affecting the upper and lower pole left kidney. Unremarkable bladder. Stomach/Bowel: Concerning thickening along the distal descending colon, with stool retention proximal to this segment and decompressed bowel distally. There is a enlarged mesocolic lymph node  at this level measuring 10 mm. No superimposed inflammatory changes. Mild colonic diverticulosis. Vascular/Lymphatic: Aortic atherosclerosis, extensive for age. No acute vascular finding. Pericolic adenopathy as described. No retroperitoneal adenopathy seen. Reproductive:No pathologic findings. Other: No ascites or pneumoperitoneum. Musculoskeletal: No acute abnormalities. Called to the scanner to evaluate patient who felt lightheaded after contrast infusion. Patient was normotensive with normal heart rate. He appeared pale. Mentation was normal. No urticaria or difficulty breathing. After few minutes, he felt back to baseline. He describes being weak and faint frequently. He does not feel safe to drive home and would like ER evaluation. Findings and event swere called by telephone at the time of interpretation  on 05/09/2016 at 3:03 pm to Dr Herbert Spires, covering for Dr. [name]. ER is aware of transfer. IMPRESSION: 1. 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 There is masslike thickening in this region and extensive proximal stool retention. Mesocolic adenopathy, presumed metastatic node. 2. Patient experienced mild contrast reaction as described above. He return to baseline without treatment. Patient is being transferred to ER for evaluation of faintness that was present prior to contrast. 3.  Aortic Atherosclerosis (ICD10-170.0), age advanced 77. Remote left ventricular infarct with dilatation. 5. Subcentimeter hypervascular focus in the upper liver, not typical for metastatic disease. Attention on follow-up Electronically Signed   By: Monte Fantasia M.D.   On: 05/09/2016 15:07   Dg Chest Port 1 View  Result Date: 05/20/2016 CLINICAL DATA:  Chest and abdominal pain, shortness of breath EXAM: PORTABLE CHEST 1 VIEW COMPARISON:  05/17/2017, 12/27/2014 FINDINGS: Left-sided single lead ICD device. Mildly low lung volumes with bibasilar atelectasis. The heart appears borderline enlarged. Slight central vascular congestion. No pneumothorax. No large effusion. IMPRESSION: 1. Low lung volumes with mild bibasilar atelectasis 2. Borderline cardiomegaly with slight central congestion Electronically Signed   By: Donavan Foil M.D.   On: 05/20/2016 01:54    Microbiology: Recent Results (from the past 240 hour(s))  Aerobic Culture (superficial specimen)     Status: None   Collection Time: 05/25/16  4:32 PM  Result Value Ref Range Status   Specimen Description WOUND ABDOMEN  Final   Special Requests NONE  Final   Gram Stain   Final    MODERATE WBC PRESENT, PREDOMINANTLY PMN ABUNDANT GRAM NEGATIVE RODS MODERATE GRAM POSITIVE COCCI IN PAIRS IN CHAINS IN CLUSTERS GRAM STAIN REVIEWED-AGREE WITH RESULT    Culture RARE STAPHYLOCOCCUS EPIDERMIDIS  Final   Report Status 05/28/2016 FINAL  Final   Organism ID, Bacteria STAPHYLOCOCCUS  EPIDERMIDIS  Final      Susceptibility   Staphylococcus epidermidis - MIC*    CIPROFLOXACIN <=0.5 SENSITIVE Sensitive     ERYTHROMYCIN <=0.25 SENSITIVE Sensitive     GENTAMICIN <=0.5 SENSITIVE Sensitive     OXACILLIN <=0.25 SENSITIVE Sensitive     TETRACYCLINE <=1 SENSITIVE Sensitive     VANCOMYCIN 2 SENSITIVE Sensitive     TRIMETH/SULFA <=10 SENSITIVE Sensitive     CLINDAMYCIN <=0.25 SENSITIVE Sensitive     RIFAMPIN <=0.5 SENSITIVE Sensitive     Inducible Clindamycin NEGATIVE Sensitive     * RARE STAPHYLOCOCCUS EPIDERMIDIS     Labs: Basic Metabolic Panel:  Recent Labs Lab 05/23/16 0516 05/27/16 0408  NA 139 141  K 3.1* 3.5  CL 98* 104  CO2 25 29  GLUCOSE 98 119*  BUN 15 19  CREATININE 1.21 1.12  CALCIUM 8.5* 8.4*   Liver Function Tests:  Recent Labs Lab 05/27/16 0408  AST 29  ALT 31  ALKPHOS 43  BILITOT 0.3  PROT 4.8*  ALBUMIN 2.2*   No results for input(s): LIPASE, AMYLASE in the last 168 hours. No results for input(s): AMMONIA in the last 168 hours. CBC:  Recent Labs Lab 05/22/16 1933 05/23/16 0516 05/24/16 1137 05/27/16 0408  WBC  --  7.2 8.5 8.7  HGB 9.4* 10.0* 10.1* 9.4*  HCT 28.5* 30.1* 31.0* 28.6*  MCV  --  83.6 84.2 85.6  PLT  --  152 218 240   Cardiac Enzymes: No results for input(s): CKTOTAL, CKMB, CKMBINDEX, TROPONINI in the last 168 hours. BNP: BNP (last 3 results) No results for input(s): BNP in the last 8760 hours.  ProBNP (last 3 results) No results for input(s): PROBNP in the last 8760 hours.  CBG: No results for input(s): GLUCAP in the last 168 hours.   Signed:  Velvet Bathe MD.  [facility] 05/29/2016, 12:57 PM

## 2016-05-29 NOTE — Consult Note (Addendum)
[place] Nurse ostomy follow up Surgical team following for assessment and plan of care to abd wound. Pt states he is discharging home today and he has been emptying the pouch without assistance and feels he is able to change the pouch independently.  He declines offer of pouch change demonstration today.  There are 4 extra pouches and barrier rings at bedside and educational materials are in the room. Pt denies further questions. Enrolled patient in [place] [employer] Discharge program: Yes Julien Girt MSN, RN, [place], [place], [place]

## 2016-05-29 NOTE — Discharge Instructions (Signed)
[facility]      [facility] °[phone] ° °OPEN ABDOMINAL SURGERY: POST OP INSTRUCTIONS ° °Always review your discharge instruction sheet given to you by the facility where your surgery was performed. ° °IF YOU HAVE DISABILITY OR FAMILY LEAVE FORMS, YOU MUST BRING THEM TO THE OFFICE FOR PROCESSING.  PLEASE DO NOT GIVE THEM TO YOUR DOCTOR. ° °1. A prescription for pain medication may be given to you upon discharge.  Take your pain medication as prescribed, if needed.  If narcotic pain medicine is not needed, then you may take acetaminophen (Tylenol) or ibuprofen (Advil) as needed. °2. Take your usually prescribed medications unless otherwise directed. °3. If you need a refill on your pain medication, please contact your pharmacy. They will contact our office to request authorization.  Prescriptions will not be filled after 5pm or on week-ends. °4. You should follow a light diet the first few days after arrival home, such as soup and crackers, pudding, etc.unless your doctor has advised otherwise. A high-fiber, low fat diet can be resumed as tolerated.   Be sure to include lots of fluids daily. Most patients will experience some swelling and bruising on the chest and neck area.  Ice packs will help.  Swelling and bruising can take several days to resolve °5. Most patients will experience some swelling and bruising in the area of the incision. Ice pack will help. Swelling and bruising can take several days to resolve..  °6. It is common to experience some constipation if taking pain medication after surgery.  Increasing fluid intake and taking a stool softener will usually help or prevent this problem from occurring.  A mild laxative (Milk of Magnesia or Miralax) should be taken according to package directions if there are no bowel movements after 48 hours. °7.  You may have steri-strips (small skin tapes) in place directly over the incision.  These strips should be left on the skin for 7-10 days.  If your  surgeon used skin glue on the incision, you may shower in 24 hours.  The glue will flake off over the next 2-3 weeks.  Any sutures or staples will be removed at the office during your follow-up visit. You may find that a light gauze bandage over your incision may keep your staples from being rubbed or pulled. You may shower and replace the bandage daily. °8. ACTIVITIES:  You may resume regular (light) daily activities beginning the next day--such as daily self-care, walking, climbing stairs--gradually increasing activities as tolerated.  You may have sexual intercourse when it is comfortable.  Refrain from any heavy lifting or straining until approved by your doctor. °a. You may drive when you no longer are taking prescription pain medication, you can comfortably wear a seatbelt, and you can safely maneuver your car and apply brakes °b. Return to Work: ___________________________________ °9. You should see your doctor in the office for a follow-up appointment approximately two weeks after your surgery.  Make sure that you call for this appointment within a day or two after you arrive home to insure a convenient appointment time. °OTHER INSTRUCTIONS:  °_____________________________________________________________ °_____________________________________________________________ ° °WHEN TO CALL YOUR DOCTOR: °1. Fever over 101.0 °2. Inability to urinate °3. Nausea and/or vomiting °4. Extreme swelling or bruising °5. Continued bleeding from incision. °6. Increased pain, redness, or drainage from the incision. °7. Difficulty swallowing or breathing °8. Muscle cramping or spasms. °9. Numbness or tingling in hands or feet or around lips. ° °The clinic staff is available to   answer your questions during regular business hours.  Please dont hesitate to call and ask to speak to one of the nurses if you have concerns.  For further questions, please visit [email or web address]  MIDLINE WOUND CARE: - midline dressing  to be changed twice daily - supplies: sterile saline, kerlix, scissors, ABD pads, tape  - remove dressing and all packing carefully, moistening with sterile saline as needed to avoid packing/internal dressing sticking to the wound. - clean edges of skin around the wound with water/gauze, making sure there is no tape debris or leakage left on skin that could cause skin irritation or breakdown. - dampen a clean kerlix with sterile saline and pack wound from wound base to skin level, making sure to take note of any possible areas of wound tracking, tunneling and packing appropriately. Wound can be packed loosely. Trim kerlix to size if a whole kerlix is not required. - cover wound with a dry ABD pad and secure with tape.  - write the date/time on the dry dressing/tape to better track when the last dressing change occurred. - apply any skin protectant/powder recommended by clinician to protect skin/skin folds. - change dressing as needed if leakage occurs, wound gets contaminated, or patient requests to shower. - patient may shower daily with wound open and following the shower the wound should be dried and a clean dressing placed.   Mechanical Wound Debridement  Wet to dry dressing with sterile saline, 4 x 4's, and dry ABD pad twice a day.  Gently wipe inside of wound with wet 4 x 4 with each dressing change.   Mechanical wound debridement is a treatment to remove dead tissue from a wound. This helps the wound heal. The treatment involves cleaning the wound (irrigation) and using a pad or gauze (dressing) to remove dead tissue and debris from the wound. There are different types of mechanical wound debridement. Depending on the wound, you may need to repeat this procedure or change to another form of debridement as your wound starts to heal. Tell a health care provider about:  Any allergies you have.  All medicines you are taking, including vitamins, herbs, eye drops, creams, and over-the-counter  medicines.  Any blood disorders you have.  Any medical conditions you have, including any conditions that:  Cause a significant decrease in blood circulation to the part of the body where the wound is, such as peripheral vascular disease.  Compromise your defense (immune) system or white blood count.  Any surgeries you have had.  Whether you are pregnant or may be pregnant. What are the risks? Generally, this is a safe procedure. However, problems may occur, including:  Infection.  Bleeding.  Damage to healthy tissue in and around your wound.  Soreness or pain.  Failure of the wound to heal.  Scarring. What happens before the procedure? You may be given antibiotic medicine to help prevent infection. What happens during the procedure?  Your health care provider may apply a numbing medicine (topical anesthetic) to the wound.  Your health care provider will irrigate your wound with a germ-free (sterile), salt-water (saline) solution. This removes debris, bacteria, and dead tissue.  Depending on what type of mechanical wound debridement you are having, your health care provider may do one of the following:  Put a dressing on your wound. You may have dry gauze pad placed into the wound. Your health care provider will remove the gauze after the wound is dry. Any dead tissue and debris that has dried into  the gauze will be lifted out of the wound (wet-to-dry debridement).  Use a type of pad (monofilament fiber debridement pad). This pad has a fluffy surface on one side that picks up dead tissue and debris from your wound. Your health care provider wets the pad and wipes it over your wound for several minutes.  Irrigate your wound with a pressurized stream of solution such as saline or water.  Once your health care provider is finished, he or she may apply a light dressing to your wound. The procedure may vary among health care providers and hospitals. What happens after the  procedure?  You may receive medicine for pain.  You will continue to receive antibiotic medicine if it was started before your procedure. This information is not intended to replace advice given to you by your health care provider. Make sure you discuss any questions you have with your health care provider. Document Released: 10/06/2014 Document Revised: 06/23/2015 Document Reviewed: 05/26/2014 Elsevier Interactive Patient Education  2017 [employer].

## 2016-05-31 ENCOUNTER — Telehealth: Payer: Self-pay | Admitting: Family Medicine

## 2016-05-31 NOTE — Telephone Encounter (Signed)
Called to patient Verified name and DOB   He is home Getting home health from [facility] to start tomorrow to help with medication management, colostomy bag change and wound care.   He has an open abdominal wound from staph infection  He is changing his wound twice daily He reports pain level is minimal   He does have stomach churning when he coughs He has burning  pain when he drinks in his stomach area He is taking Protonix 40 mg BID  He is still awaiting plan regarding whether or not he will need chemo or radiation, he had 1 positive lymph node

## 2016-05-31 NOTE — Telephone Encounter (Signed)
Patient was able to complete c-scope

## 2016-06-01 DIAGNOSIS — J449 Chronic obstructive pulmonary disease, unspecified: Secondary | ICD-10-CM | POA: Diagnosis not present

## 2016-06-01 DIAGNOSIS — D649 Anemia, unspecified: Secondary | ICD-10-CM | POA: Diagnosis not present

## 2016-06-01 DIAGNOSIS — I2510 Atherosclerotic heart disease of native coronary artery without angina pectoris: Secondary | ICD-10-CM | POA: Diagnosis not present

## 2016-06-01 DIAGNOSIS — N189 Chronic kidney disease, unspecified: Secondary | ICD-10-CM | POA: Diagnosis not present

## 2016-06-01 DIAGNOSIS — I5022 Chronic systolic (congestive) heart failure: Secondary | ICD-10-CM | POA: Diagnosis not present

## 2016-06-01 DIAGNOSIS — E785 Hyperlipidemia, unspecified: Secondary | ICD-10-CM | POA: Diagnosis not present

## 2016-06-01 DIAGNOSIS — Z95810 Presence of automatic (implantable) cardiac defibrillator: Secondary | ICD-10-CM | POA: Diagnosis not present

## 2016-06-01 DIAGNOSIS — Z433 Encounter for attention to colostomy: Secondary | ICD-10-CM | POA: Diagnosis not present

## 2016-06-01 DIAGNOSIS — I130 Hypertensive heart and chronic kidney disease with heart failure and stage 1 through stage 4 chronic kidney disease, or unspecified chronic kidney disease: Secondary | ICD-10-CM | POA: Diagnosis not present

## 2016-06-01 DIAGNOSIS — C186 Malignant neoplasm of descending colon: Secondary | ICD-10-CM | POA: Diagnosis not present

## 2016-06-01 DIAGNOSIS — D696 Thrombocytopenia, unspecified: Secondary | ICD-10-CM | POA: Diagnosis not present

## 2016-06-01 DIAGNOSIS — Z483 Aftercare following surgery for neoplasm: Secondary | ICD-10-CM | POA: Diagnosis not present

## 2016-06-04 DIAGNOSIS — Z483 Aftercare following surgery for neoplasm: Secondary | ICD-10-CM | POA: Diagnosis not present

## 2016-06-04 DIAGNOSIS — I5022 Chronic systolic (congestive) heart failure: Secondary | ICD-10-CM | POA: Diagnosis not present

## 2016-06-04 DIAGNOSIS — I130 Hypertensive heart and chronic kidney disease with heart failure and stage 1 through stage 4 chronic kidney disease, or unspecified chronic kidney disease: Secondary | ICD-10-CM | POA: Diagnosis not present

## 2016-06-04 DIAGNOSIS — Z433 Encounter for attention to colostomy: Secondary | ICD-10-CM | POA: Diagnosis not present

## 2016-06-04 DIAGNOSIS — C186 Malignant neoplasm of descending colon: Secondary | ICD-10-CM | POA: Diagnosis not present

## 2016-06-04 DIAGNOSIS — I2510 Atherosclerotic heart disease of native coronary artery without angina pectoris: Secondary | ICD-10-CM | POA: Diagnosis not present

## 2016-06-07 DIAGNOSIS — C186 Malignant neoplasm of descending colon: Secondary | ICD-10-CM | POA: Diagnosis not present

## 2016-06-07 DIAGNOSIS — Z483 Aftercare following surgery for neoplasm: Secondary | ICD-10-CM | POA: Diagnosis not present

## 2016-06-07 DIAGNOSIS — I5022 Chronic systolic (congestive) heart failure: Secondary | ICD-10-CM | POA: Diagnosis not present

## 2016-06-07 DIAGNOSIS — I2510 Atherosclerotic heart disease of native coronary artery without angina pectoris: Secondary | ICD-10-CM | POA: Diagnosis not present

## 2016-06-07 DIAGNOSIS — I130 Hypertensive heart and chronic kidney disease with heart failure and stage 1 through stage 4 chronic kidney disease, or unspecified chronic kidney disease: Secondary | ICD-10-CM | POA: Diagnosis not present

## 2016-06-07 DIAGNOSIS — Z433 Encounter for attention to colostomy: Secondary | ICD-10-CM | POA: Diagnosis not present

## 2016-06-12 DIAGNOSIS — C186 Malignant neoplasm of descending colon: Secondary | ICD-10-CM | POA: Diagnosis not present

## 2016-06-12 DIAGNOSIS — I5022 Chronic systolic (congestive) heart failure: Secondary | ICD-10-CM | POA: Diagnosis not present

## 2016-06-12 DIAGNOSIS — I130 Hypertensive heart and chronic kidney disease with heart failure and stage 1 through stage 4 chronic kidney disease, or unspecified chronic kidney disease: Secondary | ICD-10-CM | POA: Diagnosis not present

## 2016-06-12 DIAGNOSIS — Z433 Encounter for attention to colostomy: Secondary | ICD-10-CM | POA: Diagnosis not present

## 2016-06-12 DIAGNOSIS — I2510 Atherosclerotic heart disease of native coronary artery without angina pectoris: Secondary | ICD-10-CM | POA: Diagnosis not present

## 2016-06-12 DIAGNOSIS — Z483 Aftercare following surgery for neoplasm: Secondary | ICD-10-CM | POA: Diagnosis not present

## 2016-06-13 ENCOUNTER — Encounter: Payer: Self-pay | Admitting: Family Medicine

## 2016-06-13 NOTE — Progress Notes (Signed)
Subjective:  Patient ID: Christopher [name], male    DOB: 1960-02-01  Age: 56 y.o. MRN: [id number]  CC: Anemia   HPI Christopher [name] has CAD (s/p cath with stent placement 40/9811), chronic systolic CHF, anxiety, recent diagnosis of adenocarcinoma s/p colectomy/ostomy he presents for   1. Colon adenocarcinoma diagnosed 05/16/2016: initial indication was his normocytic anemia with H/H 9.4/29.1. He has underwent CT abdomen and pelvis that confirmed descending colon mass. He has incomplete colonoscopy due to obstructive mass on 05/16/2016. He was admitted into the hospital on 05/16/16 and discharged on 05/29/2016. He is s/p hemicolectomy with ostomy 05/18/2016.  He has not yet established with oncology.  He is not taking oral iron. He reports he did not like the color change to his stool. He reports he is eating well. He has noticed weight gain. He reports night sweats. Denies fever.  He denies abdominal pain. He has midline abdominal wound follow post op cellulitis with staph epidermididis and is receiving home health twice weekly.  Social History  Substance Use Topics  . Smoking status: Former Smoker    Packs/day: 1.50    Years: 30.00    Types: Cigarettes    Quit date: 12/22/2014  . Smokeless tobacco: Never Used  . Alcohol use No     Comment: quit in 08/2013     Outpatient Medications Prior to Visit  Medication Sig Dispense Refill  . aspirin 81 MG chewable tablet Chew 1 tablet (81 mg total) by mouth daily. 30 tablet 5  . atorvastatin (LIPITOR) 80 MG tablet Take 1 tablet (80 mg total) by mouth daily at 6 PM. 30 tablet 5  . carvedilol (COREG) 3.125 MG tablet Take 1 tablet (3.125 mg total) by mouth 2 (two) times daily with a meal. 60 tablet 0  . clopidogrel (PLAVIX) 75 MG tablet Take 75 mg by mouth daily.    . digoxin (LANOXIN) 0.125 MG tablet Take 1 tablet (0.125 mg total) by mouth daily. 30 tablet 5  . feeding supplement, ENSURE ENLIVE, (ENSURE ENLIVE) LIQD Take 237 mLs by mouth 3 (three) times  daily between meals. 237 mL 12  . ferrous sulfate 325 (65 FE) MG tablet Take 1 tablet (325 mg total) by mouth 2 (two) times daily with a meal. 60 tablet 3  . lisinopril (PRINIVIL,ZESTRIL) 2.5 MG tablet Take 1 tablet (2.5 mg total) by mouth daily. 30 tablet 5  . Multiple Vitamin (MULTIVITAMIN WITH MINERALS) TABS tablet Take 1 tablet by mouth daily. 30 tablet 0  . Na Sulfate-K Sulfate-Mg Sulf 17.5-3.13-1.6 GM/180ML SOLN Suprep-Use as directed 354 mL 0  . nitroGLYCERIN (NITROSTAT) 0.4 MG SL tablet Place 1 tablet (0.4 mg total) under the tongue every 5 (five) minutes as needed for chest pain. 20 tablet 0  . pantoprazole (PROTONIX) 40 MG tablet Take 1 tablet (40 mg total) by mouth 2 (two) times daily. 60 tablet 0  . saccharomyces boulardii (FLORASTOR) 250 MG capsule Take 1 capsule (250 mg total) by mouth 2 (two) times daily. 30 capsule 0   No facility-administered medications prior to visit.     ROS Review of Systems  Constitutional: Positive for fatigue. Negative for chills, fever and unexpected weight change.  Eyes: Negative for visual disturbance.  Respiratory: Negative for cough and shortness of breath.   Cardiovascular: Negative for chest pain, palpitations and leg swelling.  Gastrointestinal: Positive for abdominal pain and blood in stool. Negative for constipation, diarrhea, nausea and vomiting.  Endocrine: Negative for polydipsia, polyphagia and polyuria.  Musculoskeletal: Negative for arthralgias, back pain, gait problem, myalgias and neck pain.  Skin: Negative for rash.  Allergic/Immunologic: Negative for immunocompromised state.  Hematological: Negative for adenopathy. Does not bruise/bleed easily.  Psychiatric/Behavioral: Negative for dysphoric mood, sleep disturbance and suicidal ideas. The patient is not nervous/anxious.     Objective:  BP 111/71   Pulse 74   Temp 97.6 F (36.4 C) (Oral)   Ht 5\' 8"  (1.727 m)   Wt 141 lb (64 kg)   SpO2 99%   BMI 21.44 kg/m   BP/Weight  06/14/2016 05/29/2016 3/70/9643  Systolic BP 838 184 037  Diastolic BP 71 73 86  Wt. (Lbs) 141 162.4 158  BMI 21.44 - 24.02   Wt Readings from Last 3 Encounters:  06/14/16 141 lb (64 kg)  05/29/16 162 lb 6.4 oz (73.7 kg)  05/16/16 158 lb (71.7 kg)     Physical Exam  Constitutional: He appears well-developed and well-nourished. No distress.  HENT:  Head: Normocephalic and atraumatic.  Neck: Normal range of motion. Neck supple.  Cardiovascular: Normal rate, regular rhythm, normal heart sounds and intact distal pulses.   Pulmonary/Chest: Effort normal and breath sounds normal.  Abdominal: Soft. Bowel sounds are normal. He exhibits no distension and no mass. There is no tenderness. There is no rebound and no guarding.    Genitourinary: Rectal exam shows guaiac negative stool.  Musculoskeletal: He exhibits no edema.  Lymphadenopathy:    He has no cervical adenopathy.       Right: No inguinal and no supraclavicular adenopathy present.       Left: No inguinal and no supraclavicular adenopathy present.  Neurological: He is alert.  Skin: Skin is warm and dry. No rash noted. No erythema.  Psychiatric: He has a normal mood and affect.   Lab Results  Component Value Date   WBC 8.7 05/27/2016   HGB 9.4 (L) 05/27/2016   HCT 28.6 (L) 05/27/2016   MCV 85.6 05/27/2016   PLT 240 05/27/2016     Assessment & Plan:  Christopher [name] was seen today for anemia.  Diagnoses and all orders for this visit:  Anemia due to blood loss -     CBC -     Iron and TIBC -     Ferritin  Adenocarcinoma of descending colon  pT3, pN1a, pMX s/p colectomy/ostomy 05/18/2016 -     Ambulatory referral to Oncology  Wound disruption, post-op, skin, sequela  Other orders -     Tdap vaccine greater than or equal to 7yo IM   There are no diagnoses linked to this encounter.  No orders of the defined types were placed in this encounter.   Follow-up: Return in about 8 weeks (around 08/09/2016) for wound check .    [name] MD

## 2016-06-14 ENCOUNTER — Ambulatory Visit: Payer: Medicare Other | Attending: Family Medicine | Admitting: Family Medicine

## 2016-06-14 ENCOUNTER — Encounter: Payer: Self-pay | Admitting: Family Medicine

## 2016-06-14 VITALS — BP 111/71 | HR 74 | Temp 97.6°F | Ht 68.0 in | Wt 141.0 lb

## 2016-06-14 DIAGNOSIS — T8131XS Disruption of external operation (surgical) wound, not elsewhere classified, sequela: Secondary | ICD-10-CM

## 2016-06-14 DIAGNOSIS — C186 Malignant neoplasm of descending colon: Secondary | ICD-10-CM | POA: Diagnosis not present

## 2016-06-14 DIAGNOSIS — D500 Iron deficiency anemia secondary to blood loss (chronic): Secondary | ICD-10-CM | POA: Insufficient documentation

## 2016-06-14 DIAGNOSIS — Z9049 Acquired absence of other specified parts of digestive tract: Secondary | ICD-10-CM | POA: Diagnosis not present

## 2016-06-14 DIAGNOSIS — Z23 Encounter for immunization: Secondary | ICD-10-CM | POA: Diagnosis not present

## 2016-06-14 DIAGNOSIS — I255 Ischemic cardiomyopathy: Secondary | ICD-10-CM | POA: Diagnosis not present

## 2016-06-14 DIAGNOSIS — D649 Anemia, unspecified: Secondary | ICD-10-CM | POA: Diagnosis present

## 2016-06-14 NOTE — Assessment & Plan Note (Signed)
Oncology referral placed.

## 2016-06-14 NOTE — Assessment & Plan Note (Signed)
Wound is clean Continue home health He has general surgery follow up on 06/28/16

## 2016-06-14 NOTE — Assessment & Plan Note (Signed)
Anemia related to colon cancer Not taking iron therapy Plan: CBC and iron studies

## 2016-06-14 NOTE — Patient Instructions (Addendum)
Kreig was seen today for anemia.  Diagnoses and all orders for this visit:  Anemia due to blood loss -     CBC -     Iron and TIBC -     Ferritin  Adenocarcinoma of descending colon  pT3, pN1a, pMX s/p colectomy/ostomy 05/18/2016 -     Ambulatory referral to Oncology  get ensure, equate or carnation instant breakfast for nutritional supplement  F/u in 8 weeks for weight check   Dr. [name]

## 2016-06-15 DIAGNOSIS — I5022 Chronic systolic (congestive) heart failure: Secondary | ICD-10-CM | POA: Diagnosis not present

## 2016-06-15 DIAGNOSIS — Z433 Encounter for attention to colostomy: Secondary | ICD-10-CM | POA: Diagnosis not present

## 2016-06-15 DIAGNOSIS — I130 Hypertensive heart and chronic kidney disease with heart failure and stage 1 through stage 4 chronic kidney disease, or unspecified chronic kidney disease: Secondary | ICD-10-CM | POA: Diagnosis not present

## 2016-06-15 DIAGNOSIS — C186 Malignant neoplasm of descending colon: Secondary | ICD-10-CM | POA: Diagnosis not present

## 2016-06-15 DIAGNOSIS — I2510 Atherosclerotic heart disease of native coronary artery without angina pectoris: Secondary | ICD-10-CM | POA: Diagnosis not present

## 2016-06-15 DIAGNOSIS — Z483 Aftercare following surgery for neoplasm: Secondary | ICD-10-CM | POA: Diagnosis not present

## 2016-06-15 LAB — CBC
Hematocrit: 32.4 % — ABNORMAL LOW (ref 37.5–51.0)
Hemoglobin: 10.5 g/dL — ABNORMAL LOW (ref 13.0–17.7)
MCH: 27.9 pg (ref 26.6–33.0)
MCHC: 32.4 g/dL (ref 31.5–35.7)
MCV: 86 fL (ref 79–97)
Platelets: 172 10*3/uL (ref 150–379)
RBC: 3.77 x10E6/uL — AB (ref 4.14–5.80)
RDW: 17.5 % — AB (ref 12.3–15.4)
WBC: 6.8 10*3/uL (ref 3.4–10.8)

## 2016-06-15 LAB — FERRITIN: FERRITIN: 33 ng/mL (ref 30–400)

## 2016-06-15 LAB — IRON AND TIBC
IRON: 52 ug/dL (ref 38–169)
Iron Saturation: 17 % (ref 15–55)
Total Iron Binding Capacity: 300 ug/dL (ref 250–450)
UIBC: 248 ug/dL (ref 111–343)

## 2016-06-18 ENCOUNTER — Telehealth: Payer: Self-pay

## 2016-06-18 NOTE — Telephone Encounter (Signed)
Pt was called and there was a VM left. Pt results will be mailed out.

## 2016-06-20 DIAGNOSIS — I130 Hypertensive heart and chronic kidney disease with heart failure and stage 1 through stage 4 chronic kidney disease, or unspecified chronic kidney disease: Secondary | ICD-10-CM | POA: Diagnosis not present

## 2016-06-20 DIAGNOSIS — I5022 Chronic systolic (congestive) heart failure: Secondary | ICD-10-CM | POA: Diagnosis not present

## 2016-06-20 DIAGNOSIS — I2510 Atherosclerotic heart disease of native coronary artery without angina pectoris: Secondary | ICD-10-CM | POA: Diagnosis not present

## 2016-06-20 DIAGNOSIS — C186 Malignant neoplasm of descending colon: Secondary | ICD-10-CM | POA: Diagnosis not present

## 2016-06-20 DIAGNOSIS — Z483 Aftercare following surgery for neoplasm: Secondary | ICD-10-CM | POA: Diagnosis not present

## 2016-06-20 DIAGNOSIS — Z433 Encounter for attention to colostomy: Secondary | ICD-10-CM | POA: Diagnosis not present

## 2016-06-26 DIAGNOSIS — I5022 Chronic systolic (congestive) heart failure: Secondary | ICD-10-CM | POA: Diagnosis not present

## 2016-06-26 DIAGNOSIS — E785 Hyperlipidemia, unspecified: Secondary | ICD-10-CM | POA: Diagnosis not present

## 2016-06-26 DIAGNOSIS — M5136 Other intervertebral disc degeneration, lumbar region: Secondary | ICD-10-CM | POA: Diagnosis not present

## 2016-06-26 DIAGNOSIS — I252 Old myocardial infarction: Secondary | ICD-10-CM | POA: Diagnosis not present

## 2016-06-26 DIAGNOSIS — Z01810 Encounter for preprocedural cardiovascular examination: Secondary | ICD-10-CM | POA: Diagnosis not present

## 2016-06-26 DIAGNOSIS — Z95810 Presence of automatic (implantable) cardiac defibrillator: Secondary | ICD-10-CM | POA: Diagnosis not present

## 2016-06-26 DIAGNOSIS — I2510 Atherosclerotic heart disease of native coronary artery without angina pectoris: Secondary | ICD-10-CM | POA: Diagnosis not present

## 2016-06-26 DIAGNOSIS — Z955 Presence of coronary angioplasty implant and graft: Secondary | ICD-10-CM | POA: Diagnosis not present

## 2016-06-26 DIAGNOSIS — R002 Palpitations: Secondary | ICD-10-CM | POA: Diagnosis not present

## 2016-06-27 ENCOUNTER — Telehealth: Payer: Self-pay | Admitting: Family Medicine

## 2016-06-27 NOTE — Telephone Encounter (Signed)
I contacted [facility] and they will contact the patient to schedule an appointment . Thank you

## 2016-06-27 NOTE — Telephone Encounter (Signed)
Patient came by the office asking to speak with Referral Coordinator in regards to his referral for the oncologist. Patient stated it's urgent. Please follow up.

## 2016-06-28 ENCOUNTER — Telehealth: Payer: Self-pay | Admitting: Hematology

## 2016-06-28 NOTE — Telephone Encounter (Signed)
Pt returned my call to confirm appt that has been scheduled to see Dr. Burr Medico on 6/4 at 215pm. Pt aware to arrive 30 minutes early to be checked in on time. Demographics verified. Location given.

## 2016-06-28 NOTE — Progress Notes (Signed)
[facility]  [phone] Fax:([phone]  Clinic New Consult Note   Patient Care Team: [name], MD as PCP - General (Family Medicine) [name], MD as Consulting Physician (Cardiology) [name], MD as Consulting Physician (Cardiology) [name], MD as Consulting Physician (Hematology) 07/02/2016  Referring physician: Dr. [name]  CHIEF COMPLAINTS/PURPOSE OF CONSULTATION:  Recent diagnosis of Invasive Colorectal Adenocarcinoma of the Colon   Oncology History   Cancer Staging Adenocarcinoma of descending colon  pT3, pN1a, pMX s/p colectomy/ostomy 05/18/2016 Staging form: Colon and Rectum, AJCC 8th Edition - Pathologic stage from 05/18/2016: Stage IIIB (pT3, pN1a, cM0) - Signed by [name], MD on 07/01/2016       Adenocarcinoma of descending colon  pT3, pN1a, pMX s/p colectomy/ostomy 05/18/2016   05/09/2016 Imaging    CT chest, abdomen and pelvis showed probable descending colon carcinoma. No evidence of distant metastasis. 7 cm hypervascular focus in the upper liver, not typical for metastatic disease.      05/16/2016 Procedure    Colonoscopy showed a large mass which completely obstructss the distal descending colon. Biopsied. Diverticulosis in the left colon.      05/18/2016 Surgery    Left hemicolectomy by Dr. [name]      05/18/2016 Pathology Results    Left colon segmental resection shows invasive colorectal adenocarcinoma, 4.6 cm, tumor extends into subserosa connective tissue, margins are negative, metastatic carcinoma in 1 of 12 lymph nodes. Grade 2, lymphovascular invasion present, perineural invasion negative.      05/18/2016 Miscellaneous    MSI-stable       05/18/2016 Initial Diagnosis    Adenocarcinoma of descending colon  pT3, pN1a, pMX s/p colectomy/ostomy 05/18/2016       HISTORY OF PRESENTING ILLNESS:  07/02/16 Christopher [name] 56 y.o. male is here because of recent diagnosis of Invasive Colorectal  Adenocarcinoma of the Colon. Initially he was bleeding on and off and had a a lot of gas and constipation. He had a [place] in December and that did not cover his bowel. He also felt a line appearing on his stomach. These symptoms in October. He also had tingling. He went to ED 05/09/16 for syncope. There he had a CT AP scan that showed evidence of a mass in his colon. Followed by a live Colonoscopy and biopsy on 4/18 done by Dr. [name] showing evidence the mass was cancer. He had a colectomy surgery 05/18/16 and the results showed to be Invasive olorectal Adenocarcinoma of the Colon. During his colonoscopy process he could not complete pre treatment  Because the cancer cut it off the path to his bowel and caused him to throw up with blood, so he did a live colonoscopy. PCP is Dr. [name]. He had gotten staph epidermitis in his incision while in the hospital his incision is half healed now.   He has a history of a Psychologist, forensic. He had 2 heart attacks and 2 stents placed and CHF.   He presents to the clinic today post colectomy. He lives by himself and he feels Ok now but he feels like he is not prepared for chemo and the sickness that comes with it. He was told to change his dressing every 3 days and he now changes it everyday. He had painful swallowing after surgery which has resolved and some ulceration in his mouth seen by his dentist. He is taking ferrous sulfate. He has a lot of stress because he is not sure what was really going  on. He is taking baby aspirin. His appetite and energy level is back to normal. He lost weight after surgery. He lost 20 pounds since surgery. He has no feet swelling and he pain on his chest around his pace maker which was put in in August 2015. He gets regularly checked for HIV. He wants to know why he cannot use target therapy. He does not want to do IV Chemotherapy. His regular cardiologist keeps him on his regular heart medication and after the surgery he got nitro. He is going to see  his cardiologist on the 19th.    CURRENT THERAPY: PENDING adjuvnat Xeloda the week of 6/18   MEDICAL HISTORY:  Past Medical History:  Diagnosis Date  . Automatic implantable cardioverter-defibrillator in situ    MDT Aug 2015 Dr. Lovena Le  . CAD (coronary artery disease), native coronary artery 07/03/2013   Cath 06/20/13  Normal left main, occluded LAD, occluded RCA, 50% circ EF 15%  3.0 x28 and 3.0 x 8 mm Xience stent Dr. Tamala Julian  To LAD   . Chronic kidney disease   . Chronic systolic CHF (congestive heart failure) (HCC)    ECHO 08/05/13  EF 30%  Anterior akinesis and inferior hypokinesis   . COPD (chronic obstructive pulmonary disease) ([place])   . Hyperlipidemia   . Mass of colon 04/2016  . Myocardial infarction ([place]) 04/29/2013  . Thrombocytopenia ([place]) 12/27/2014   Chronic      SURGICAL HISTORY: Past Surgical History:  Procedure Laterality Date  . CARDIAC CATHETERIZATION  05/2013   . CARDIAC CATHETERIZATION N/A 12/28/2014   Procedure: Left Heart Cath and Coronary Angiography;  Surgeon: [name], MD;  Location: [facility];  Service: Cardiovascular;  Laterality: N/A;  . CARDIAC CATHETERIZATION N/A 12/28/2014   Procedure: Coronary Stent Intervention;  Surgeon: [name], MD;  Location: [facility];  Service: Cardiovascular;  Laterality: N/A;  . COLECTOMY WITH COLOSTOMY CREATION/HARTMANN PROCEDURE Left 05/18/2016   Procedure: COLECTOMY WITH OSTOMY CREATION/HARTMANN PROCEDURE;  Surgeon: [name], MD;  Location: [facility];  Service: General;  Laterality: Left;  . CORONARY ANGIOPLASTY  05/2013   . FLEXIBLE SIGMOIDOSCOPY N/A 05/16/2016   Procedure: FLEXIBLE SIGMOIDOSCOPY;  Surgeon: [name], MD;  Location: [facility];  Service: Gastroenterology;  Laterality: N/A;  . ICD placement  09/07/2013   . IMPLANTABLE CARDIOVERTER DEFIBRILLATOR IMPLANT N/A 09/07/2013   Procedure: IMPLANTABLE CARDIOVERTER DEFIBRILLATOR IMPLANT;  Surgeon: [name], MD;  Location[facility]  CATH LAB;  Service: Cardiovascular;  Laterality: N/A;  . INTRA-AORTIC BALLOON PUMP INSERTION  06/20/2013   Procedure: INTRA-AORTIC BALLOON PUMP INSERTION;  Surgeon: Sinclair Grooms, MD;  Location: [facility] CATH LAB;  Service: Cardiovascular;;  . LEFT HEART CATHETERIZATION WITH CORONARY ANGIOGRAM N/A 06/20/2013   Procedure: LEFT HEART CATHETERIZATION WITH CORONARY ANGIOGRAM;  Surgeon: Sinclair Grooms, MD;  Location: [facility] CATH LAB;  Service: Cardiovascular;  Laterality: N/A;  . PERCUTANEOUS CORONARY STENT INTERVENTION (PCI-S)  06/20/2013   Procedure: PERCUTANEOUS CORONARY STENT INTERVENTION (PCI-S);  Surgeon: Sinclair Grooms, MD;  Location: [facility] CATH LAB;  Service: Cardiovascular;;    SOCIAL HISTORY: Social History   Social History  . Marital status: Single    Spouse name: N/A  . Number of children: 0  . Years of education: 12   Occupational History  . Unemployed     Social History Main Topics  . Smoking status: Former Smoker    Packs/day: 2.00    Years: 40.00  Types: Cigarettes    Quit date: 12/22/2014  . Smokeless tobacco: Never Used  . Alcohol use No     Comment: quit in 08/2013, he used to drink alcohol moderate (1 pine liquor one week)  for 30 years   . Drug use: No  . Sexual activity: Not Currently    Birth control/ protection: Condom     Comment: men    Other Topics Concern  . Not on file   Social History Narrative   Lives alone.    In a house trying to sell his home. Exercise: No.    FAMILY HISTORY: Family History  Problem Relation Age of Onset  . Emphysema Father   . Heart disease Mother   . Dementia Mother   . Diabetes Mother   . Pancreatic cancer Maternal Grandmother   . Lung cancer Maternal Grandmother   . Cancer Neg Hx     ALLERGIES:  is allergic to celebrex [celecoxib] and contrast media [iodinated diagnostic agents].  MEDICATIONS:  Current Outpatient Prescriptions  Medication Sig Dispense Refill  . aspirin 81 MG chewable tablet Chew 1 tablet (81 mg  total) by mouth daily. 30 tablet 5  . atorvastatin (LIPITOR) 80 MG tablet Take 1 tablet (80 mg total) by mouth daily at 6 PM. 30 tablet 5  . capecitabine (XELODA) 500 MG tablet Take 4 tablets (2,000 mg total) by mouth 2 (two) times daily after a meal. 112 tablet 2  . carvedilol (COREG) 3.125 MG tablet Take 1 tablet (3.125 mg total) by mouth 2 (two) times daily with a meal. 60 tablet 0  . clopidogrel (PLAVIX) 75 MG tablet Take 75 mg by mouth daily.    . digoxin (LANOXIN) 0.125 MG tablet Take 1 tablet (0.125 mg total) by mouth daily. 30 tablet 5  . feeding supplement, ENSURE ENLIVE, (ENSURE ENLIVE) LIQD Take 237 mLs by mouth 3 (three) times daily between meals. 237 mL 12  . ferrous sulfate 325 (65 FE) MG tablet Take 1 tablet (325 mg total) by mouth 2 (two) times daily with a meal. 60 tablet 3  . lisinopril (PRINIVIL,ZESTRIL) 2.5 MG tablet Take 1 tablet (2.5 mg total) by mouth daily. 30 tablet 5  . Multiple Vitamin (MULTIVITAMIN WITH MINERALS) TABS tablet Take 1 tablet by mouth daily. 30 tablet 0  . Na Sulfate-K Sulfate-Mg Sulf 17.5-3.13-1.6 GM/180ML SOLN Suprep-Use as directed 354 mL 0  . nitroGLYCERIN (NITROSTAT) 0.4 MG SL tablet Place 1 tablet (0.4 mg total) under the tongue every 5 (five) minutes as needed for chest pain. 20 tablet 0  . pantoprazole (PROTONIX) 40 MG tablet Take 1 tablet (40 mg total) by mouth 2 (two) times daily. 60 tablet 0  . saccharomyces boulardii (FLORASTOR) 250 MG capsule Take 1 capsule (250 mg total) by mouth 2 (two) times daily. 30 capsule 0   No current facility-administered medications for this visit.     REVIEW OF SYSTEMS:   Constitutional: Denies fevers, chills or abnormal night sweats (+) weight loss  Eyes: Denies blurriness of vision, double vision or watery eyes Ears, nose, mouth, throat, and face: Denies mucositis or sore throat (+) slight ulceration in mouth Respiratory: Denies cough, dyspnea or wheezes Cardiovascular: Denies palpitation, chest discomfort or  lower extremity swelling Gastrointestinal:  Denies nausea, heartburn or change in bowel habits Skin: Denies abnormal skin rashes Lymphatics: Denies new lymphadenopathy or easy bruising Neurological:Denies numbness, tingling or new weaknesses Behavioral/Psych: Mood is stable, no new changes  All other systems were reviewed with the patient and  are negative.   PHYSICAL EXAMINATION: ECOG PERFORMANCE STATUS: 0 - Asymptomatic  Vitals:   07/02/16 1429  BP: 107/67  Pulse: 65  Resp: 18  Temp: 98.4 F (36.9 C)   Filed Weights   07/02/16 1429  Weight: 141 lb 11.2 oz (64.3 kg)    GENERAL:alert, no distress and comfortable SKIN: skin color, texture, turgor are normal, no rashes or significant lesions EYES: normal, conjunctiva are pink and non-injected, sclera clear OROPHARYNX:no exudate, no erythema and lips, buccal mucosa, and tongue normal  NECK: supple, thyroid normal size, non-tender, without nodularity LYMPH:  no palpable lymphadenopathy in the cervical, axillary or inguinal LUNGS: clear to auscultation and percussion with normal breathing effort HEART: regular rate & rhythm and no murmurs and no lower extremity edema ABDOMEN:abdomen soft, non-tender and normal bowel sounds (+) incision is pretty clean, with a large open wound and pink granulation tissue Musculoskeletal:no cyanosis of digits and no clubbing  PSYCH: alert & oriented x 3 with fluent speech NEURO: no focal motor/sensory deficits  LABORATORY DATA:  I have reviewed the data as listed CBC Latest Ref Rng & Units 07/02/2016 06/14/2016 05/27/2016  WBC 4.0 - 10.3 10e3/uL 7.5 6.8 8.7  Hemoglobin 13.0 - 17.1 g/dL 11.7(L) - 9.4(L)  Hematocrit 38.4 - 49.9 % 36.1(L) 32.4(L) 28.6(L)  Platelets 140 - 400 10e3/uL 159 172 240    CMP Latest Ref Rng & Units 07/02/2016 05/27/2016 05/23/2016  Glucose 70 - 140 mg/dl 91 119(H) 98  BUN 7.0 - 26.0 mg/dL 24.9 19 15   Creatinine 0.7 - 1.3 mg/dL 1.1 1.12 1.21  Sodium 136 - 145 mEq/L 142 141 139   Potassium 3.5 - 5.1 mEq/L 4.7 3.5 3.1(L)  Chloride 101 - 111 mmol/L - 104 98(L)  CO2 22 - 29 mEq/L 27 29 25   Calcium 8.4 - 10.4 mg/dL 10.3 8.4(L) 8.5(L)  Total Protein 6.4 - 8.3 g/dL 7.3 4.8(L) -  Total Bilirubin 0.20 - 1.20 mg/dL 0.47 0.3 -  Alkaline Phos 40 - 150 U/L 59 43 -  AST 5 - 34 U/L 18 29 -  ALT 0 - 55 U/L 17 31 -   PATHOLOGY  Diagnosis 05/18/16 Colon, segmental resection for tumor, Left - INVASIVE COLORECTAL ADENOCARCINOMA, 4.6 CM. - TUMOR EXTENDS INTO SUBSEROSAL CONNECTIVE TISSUE. - MARGINS NOT INVOLVED. - METASTATIC CARCINOMA IN ONE OF TWELVE LYMPH NODES (1/12). Microscopic Comment COLON AND RECTUM (INCLUDING TRANS-ANAL RESECTION): Specimen: Left colon. Procedure: Segmental resection. Tumor site: Descending colon. Specimen integrity: Intact. Macroscopic intactness of mesorectum: Not applicable. Macroscopic tumor perforation: No Invasive tumor: Maximum size: 4.6 cm Histologic type(s): Colorectal adenocarcinoma Histologic grade and differentiation: G2: moderately differentiated/low grade Type of polyp in which invasive carcinoma arose: No residual polyp Microscopic extension of invasive tumor: Through muscularis propria into subserosal connective tissue. Lymph-Vascular invasion: Present. Peri-neural invasion: No Tumor deposit(s) (discontinuous extramural extension): No Resection margins: Proximal margin: Free of tumor Distal margin: Free of tumor Circumferential (radial) (posterior ascending, posterior descending; lateral and posterior mid-rectum; and entire lower 1/3 rectum): Free of tumor Mesenteric margin (sigmoid and transverse): N/A Distance closest margin (if all above margins negative): 0.5 cm from radial margin Treatment effect (neo-adjuvant therapy): No Microscopic Comment(continued) Additional polyp(s): No Non-neoplastic findings: N/A Lymph nodes: number examined 12; number positive: 1 Pathologic Staging: pT3, pN1a, pMX Ancillary studies: MMR by  IHC and MSI by PCR   Diagnosis 05/16/16 Colon, biopsy, Left Descending - ADENOCARCINOMA.  PROCEDURES  ECHO 05/17/16 Study Conclusions - Left ventricle: The cavity size was mildly dilated. Wall  thickness was normal. Systolic function was severely reduced. The   estimated ejection fraction was in the range of 25% to 30%.   Akinesis of the anteroseptal, anterior, and apical myocardium.   Doppler parameters are consistent with abnormal left ventricular   relaxation (grade 1 diastolic dysfunction).   Colonoscopy 05/16/16 IMPRESSION - Likely malignant completely obstructing tumor in the distal descending colon. Biopsied. - Diverticulosis in the left colon.   RADIOGRAPHIC STUDIES: I have personally reviewed the radiological images as listed and agreed with the findings in the report. No results found.    CT Chest W Contrast 05/17/16 IMPRESSION: Negative for metastatic or acute abnormality in the chest  CT AP W Contrast 05/09/16 IMPRESSION: 1. 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 There is masslike thickening in this region and extensive proximal stool retention. Mesocolic adenopathy, presumed metastatic node. 2. Patient experienced mild contrast reaction as described above. He return to baseline without treatment. Patient is being transferred to ER for evaluation of faintness that was present prior to contrast. 3.  Aortic Atherosclerosis (ICD10-170.0), age advanced 24. Remote left ventricular infarct with dilatation. 5. Subcentimeter hypervascular focus in the upper liver, not typical for metastatic disease. Attention on follow-up   ASSESSMENT & PLAN:  CONCEPCION KIRKPATRICK is a 56 y.o. male who has a history of CAD and CHF, Chronic Kidney Disease, and HLD. He is here for treatment for his colon cancer.   1. Invasive Colorectal Adenocarcinoma of the Left Colon, pT3N1aM0,  Stage 3, MSI-stable  -I reviewed his surgical pathology finding and scan findings with patient in great  details. -He had a complete surgical resection, 1 positive lymph nodes, locally advanced. Scan showed no evidence of distant metastasis, except a indeterminate tiny liver lesion.   -We discussed the risk of cancer recurrence, which is high due to his stage III disease. I suggest adjuvant chemotherapy due to the high risk of his cancer, which is the standard of care for stage III cancer.  -I discussed the standard adjuvant chemotherapy regimen FOLFOX or CAPOX. However patient lives alone, had extensive coronary artery disease and heart failure, is not willing to consider intravenous chemotherapy. -I recommend him to consider single agent capecitabine as adjuvant therapy for 3-6 months. --Chemotherapy consent: Side effects including but does not not limited to, fatigue, nausea, vomiting, diarrhea, hair loss, neuropathy, fluid retention, renal and kidney dysfunction, neutropenic fever, needed for blood transfusion, bleeding, coronary artery spasm and heart attack, were discussed with patient in great detail. He agrees to proceed. -The goal of therapy is curative -He has recovered well from surgery, however he still has a open wound from the incision, which appears to be clean and healing  -He can start Xeloda the week of the 18th of June and I can f/u with him in 4 weeks -I'll send the prescription of Xeloda to specialty pharmacy today --I discussed the risk of cancer recurrence in the future. I discussed the surveillance plan, which is a physical exam and lab test (including CBC, CMP and CEA) every 3 months for the first 2 years, then every 6-12 months, colonoscopy in one year, and surveilliance CT scan every 6-12 month for up to 5 year.    2. CAD and systolic CHF with EF 81-01% -Has 2 stents placed and a pace maker -He will follow-up with his cardiologist  3. COPD -recently stopped smoking and was smoking for 40 years 2 packs a day    4. Family Support -He has low family support form siblings.  They  will check in but they do not help him much -He is put on disability -He feels overall more stressed  5. Financial support -He is on medicaid and disability -He does not get paid until the 27th of this month  -I will send his drug prescription to his specialty pharmacy and they can tell him how much his co-pay is.   PLAN: -Lab today  -Lab and f/u in 4 weeks  -Plan to start Xeloda week of 6/18, I sent the prescription out today  -I will copy Drs [name] and Dr. Wynonia Lawman   Orders Placed This Encounter  Procedures  . CBC with Differential    Standing Status:   Standing    Number of Occurrences:   50    Standing Expiration Date:   07/02/2021  . Comprehensive metabolic panel    Standing Status:   Standing    Number of Occurrences:   50    Standing Expiration Date:   07/02/2021  . CEA    Standing Status:   Standing    Number of Occurrences:   50    Standing Expiration Date:   07/02/2021  . Ferritin    Standing Status:   Standing    Number of Occurrences:   50    Standing Expiration Date:   07/02/2021  . Iron and TIBC    Standing Status:   Standing    Number of Occurrences:   50    Standing Expiration Date:   07/02/2021    All questions were answered. The patient knows to call the clinic with any problems, questions or concerns. I spent 55 minutes counseling the patient face to face. The total time spent in the appointment was 60 minutes and more than 50% was on counseling.  This document serves as a record of services personally performed by [name], MD. It was created on her behalf by Joslyn Devon, a trained medical scribe. The creation of this record is based on the scribe's personal observations and the provider's statements to them. This document has been checked and approved by the attending provider.      [name], MD 07/02/2016

## 2016-06-29 DIAGNOSIS — I5022 Chronic systolic (congestive) heart failure: Secondary | ICD-10-CM | POA: Diagnosis not present

## 2016-06-29 DIAGNOSIS — Z483 Aftercare following surgery for neoplasm: Secondary | ICD-10-CM | POA: Diagnosis not present

## 2016-06-29 DIAGNOSIS — Z433 Encounter for attention to colostomy: Secondary | ICD-10-CM | POA: Diagnosis not present

## 2016-06-29 DIAGNOSIS — I2510 Atherosclerotic heart disease of native coronary artery without angina pectoris: Secondary | ICD-10-CM | POA: Diagnosis not present

## 2016-06-29 DIAGNOSIS — I130 Hypertensive heart and chronic kidney disease with heart failure and stage 1 through stage 4 chronic kidney disease, or unspecified chronic kidney disease: Secondary | ICD-10-CM | POA: Diagnosis not present

## 2016-06-29 DIAGNOSIS — C186 Malignant neoplasm of descending colon: Secondary | ICD-10-CM | POA: Diagnosis not present

## 2016-07-02 ENCOUNTER — Telehealth: Payer: Self-pay | Admitting: Hematology

## 2016-07-02 ENCOUNTER — Encounter: Payer: Self-pay | Admitting: Hematology

## 2016-07-02 ENCOUNTER — Ambulatory Visit (HOSPITAL_BASED_OUTPATIENT_CLINIC_OR_DEPARTMENT_OTHER): Payer: Medicare Other | Admitting: Hematology

## 2016-07-02 ENCOUNTER — Ambulatory Visit (HOSPITAL_BASED_OUTPATIENT_CLINIC_OR_DEPARTMENT_OTHER): Payer: Medicare Other

## 2016-07-02 VITALS — BP 107/67 | HR 65 | Temp 98.4°F | Resp 18 | Ht 68.0 in | Wt 141.7 lb

## 2016-07-02 DIAGNOSIS — D500 Iron deficiency anemia secondary to blood loss (chronic): Secondary | ICD-10-CM

## 2016-07-02 DIAGNOSIS — C189 Malignant neoplasm of colon, unspecified: Secondary | ICD-10-CM

## 2016-07-02 DIAGNOSIS — C186 Malignant neoplasm of descending colon: Secondary | ICD-10-CM

## 2016-07-02 HISTORY — DX: Iron deficiency anemia secondary to blood loss (chronic): D50.0

## 2016-07-02 LAB — COMPREHENSIVE METABOLIC PANEL
ALT: 17 U/L (ref 0–55)
AST: 18 U/L (ref 5–34)
Albumin: 4.1 g/dL (ref 3.5–5.0)
Alkaline Phosphatase: 59 U/L (ref 40–150)
Anion Gap: 10 mEq/L (ref 3–11)
BUN: 24.9 mg/dL (ref 7.0–26.0)
CHLORIDE: 105 meq/L (ref 98–109)
CO2: 27 meq/L (ref 22–29)
CREATININE: 1.1 mg/dL (ref 0.7–1.3)
Calcium: 10.3 mg/dL (ref 8.4–10.4)
EGFR: 73 mL/min/{1.73_m2} — ABNORMAL LOW (ref >90)
Glucose: 91 mg/dl (ref 70–140)
Potassium: 4.7 mEq/L (ref 3.5–5.1)
Sodium: 142 mEq/L (ref 136–145)
Total Bilirubin: 0.47 mg/dL (ref 0.20–1.20)
Total Protein: 7.3 g/dL (ref 6.4–8.3)

## 2016-07-02 LAB — CBC WITH DIFFERENTIAL/PLATELET
BASO%: 0.3 % (ref 0.0–2.0)
Basophils Absolute: 0 10*3/uL (ref 0.0–0.1)
EOS%: 2.8 % (ref 0.0–7.0)
Eosinophils Absolute: 0.2 10*3/uL (ref 0.0–0.5)
HCT: 36.1 % — ABNORMAL LOW (ref 38.4–49.9)
HGB: 11.7 g/dL — ABNORMAL LOW (ref 13.0–17.1)
LYMPH#: 2.2 10*3/uL (ref 0.9–3.3)
LYMPH%: 29.1 % (ref 14.0–49.0)
MCH: 28.6 pg (ref 27.2–33.4)
MCHC: 32.4 g/dL (ref 32.0–36.0)
MCV: 88.3 fL (ref 79.3–98.0)
MONO#: 0.6 10*3/uL (ref 0.1–0.9)
MONO%: 7.8 % (ref 0.0–14.0)
NEUT%: 60 % (ref 39.0–75.0)
NEUTROS ABS: 4.5 10*3/uL (ref 1.5–6.5)
Platelets: 159 10*3/uL (ref 140–400)
RBC: 4.09 10*6/uL — AB (ref 4.20–5.82)
RDW: 18.4 % — ABNORMAL HIGH (ref 11.0–14.6)
WBC: 7.5 10*3/uL (ref 4.0–10.3)

## 2016-07-02 MED ORDER — CAPECITABINE 500 MG PO TABS: 2000.0000 mg | ORAL_TABLET | Freq: Two times a day (BID) | ORAL | 2 refills | Status: DC

## 2016-07-02 NOTE — Telephone Encounter (Signed)
Scheduled appt per 6/4 los. - gave patient AVS and calender.

## 2016-07-03 LAB — IRON AND TIBC
%SAT: 37 % (ref 20–55)
IRON: 115 ug/dL (ref 42–163)
TIBC: 314 ug/dL (ref 202–409)
UIBC: 199 ug/dL (ref 117–376)

## 2016-07-03 LAB — FERRITIN: Ferritin: 29 ng/ml (ref 22–316)

## 2016-07-03 LAB — CEA (IN HOUSE-CHCC): CEA (CHCC-In House): 3.25 ng/mL (ref 0.00–5.00)

## 2016-07-04 DIAGNOSIS — C186 Malignant neoplasm of descending colon: Secondary | ICD-10-CM | POA: Diagnosis not present

## 2016-07-04 DIAGNOSIS — I130 Hypertensive heart and chronic kidney disease with heart failure and stage 1 through stage 4 chronic kidney disease, or unspecified chronic kidney disease: Secondary | ICD-10-CM | POA: Diagnosis not present

## 2016-07-04 DIAGNOSIS — Z483 Aftercare following surgery for neoplasm: Secondary | ICD-10-CM | POA: Diagnosis not present

## 2016-07-04 DIAGNOSIS — Z433 Encounter for attention to colostomy: Secondary | ICD-10-CM | POA: Diagnosis not present

## 2016-07-04 DIAGNOSIS — I2510 Atherosclerotic heart disease of native coronary artery without angina pectoris: Secondary | ICD-10-CM | POA: Diagnosis not present

## 2016-07-04 DIAGNOSIS — I5022 Chronic systolic (congestive) heart failure: Secondary | ICD-10-CM | POA: Diagnosis not present

## 2016-07-06 ENCOUNTER — Telehealth: Payer: Self-pay | Admitting: Pharmacist

## 2016-07-06 DIAGNOSIS — C186 Malignant neoplasm of descending colon: Secondary | ICD-10-CM

## 2016-07-06 MED ORDER — CAPECITABINE 500 MG PO TABS: 2000.0000 mg | ORAL_TABLET | Freq: Two times a day (BID) | ORAL | 2 refills | Status: DC

## 2016-07-06 NOTE — Telephone Encounter (Signed)
Oral Chemotherapy Pharmacist Encounter  Received new prescription for Xeloda for the adjuvant treatment of colon cancer. Planned start date 07/16/16 Labs reviewed, OK for treatment Current medication list in Epic assessed, no DDIs with Xeloda identified  Prescription has been e-scribed to [facility] in [place], [place] (ph: [phone])  Oral Oncology Clinic will continue to follow  Johny Drilling, PharmD, BCPS, BCOP 07/06/2016  11:44 AM Oral Oncology Clinic [phone]

## 2016-07-10 ENCOUNTER — Telehealth: Payer: Self-pay | Admitting: *Deleted

## 2016-07-10 NOTE — Telephone Encounter (Signed)
Received several messages today from pt, once saying that he could not afford his xeloda & doesn't qualify for financial assistance & asking for alternative.  Second call said his cost for xeloda would be $124/month & he would just have to find the money b/c he didn't want to do IV chemo.  He stated that he would just have to not pay [facility] bill for now.  He states that he has part D SS & doesn't understand why he has to pay so much.  Third call, pt says $124 was for 1 pill/day.  His scripts was for 4 pills bid.  He said he got this info from [facility]. Informed pt that we would discuss with oral chemo pharmacist & financial counselor's.  Message routed to both.

## 2016-07-11 ENCOUNTER — Telehealth: Payer: Self-pay | Admitting: *Deleted

## 2016-07-11 DIAGNOSIS — I5022 Chronic systolic (congestive) heart failure: Secondary | ICD-10-CM | POA: Diagnosis not present

## 2016-07-11 DIAGNOSIS — Z433 Encounter for attention to colostomy: Secondary | ICD-10-CM | POA: Diagnosis not present

## 2016-07-11 DIAGNOSIS — I130 Hypertensive heart and chronic kidney disease with heart failure and stage 1 through stage 4 chronic kidney disease, or unspecified chronic kidney disease: Secondary | ICD-10-CM | POA: Diagnosis not present

## 2016-07-11 DIAGNOSIS — C186 Malignant neoplasm of descending colon: Secondary | ICD-10-CM | POA: Diagnosis not present

## 2016-07-11 DIAGNOSIS — Z483 Aftercare following surgery for neoplasm: Secondary | ICD-10-CM | POA: Diagnosis not present

## 2016-07-11 DIAGNOSIS — I2510 Atherosclerotic heart disease of native coronary artery without angina pectoris: Secondary | ICD-10-CM | POA: Diagnosis not present

## 2016-07-11 NOTE — Telephone Encounter (Signed)
Called [phone] & received information that pt's script for xeloda for 112 pills will be covered under Part B Medicare with $124.77 copay which is his deductible.  [facility] informs me that pt has not asked for any copay assistance & he needs to ask.  Essentially cost will go down once he meets his deductible with medicare if I understand correctly.  Called pt & informed that he needs to call & request Pt care coordinator for copay assistance. He says he won't qualify.  Instructed to call anyway & see b/c we do not know what the qualifications are. If he can't afford med after co-pay assistance then he may need to change to IV route.  Pt is hesitant about this & is afraid he can't drive & that he will have a reaction to the IV route.  He is very anxious.  Repeated what he needs to do & asked him to call [facility] & get back with [place].  He reports abdominal pain & feels like a know at his bellybutton.  He states the nurse is there now to evaluate. He is worried about this.  Message routed to Dr Burr Medico & Pharmacy.

## 2016-07-16 ENCOUNTER — Telehealth: Payer: Self-pay | Admitting: *Deleted

## 2016-07-16 NOTE — Telephone Encounter (Signed)
Spoke with pt and was informed that pt plans on starting Xeloda on Wed 07/18/16.   Stated he has appt with cardiologist on Tues 6/19, and will wait to start Xeloda on Wed.  Informed pt that Dr. Burr Medico has been notified of his concerns.  Pt will be contacted with further instructions from md.  Pt voiced understanding.

## 2016-07-16 NOTE — Telephone Encounter (Signed)
Pt called and left message re:  Pt has Xeloda meds.  Pt has concerned about taking too high dose of Xeloda - pt has chronic kidney disease - pt does not want to be sick from high dose Xeloda.  Pt also read on internet that most pts will have to have dose reduced.   Pt has several questions that would like to discuss with Dr. Burr Medico. Pt's   Phone     [phone].

## 2016-07-17 DIAGNOSIS — I5022 Chronic systolic (congestive) heart failure: Secondary | ICD-10-CM | POA: Diagnosis not present

## 2016-07-17 DIAGNOSIS — Z01810 Encounter for preprocedural cardiovascular examination: Secondary | ICD-10-CM | POA: Diagnosis not present

## 2016-07-17 DIAGNOSIS — Z955 Presence of coronary angioplasty implant and graft: Secondary | ICD-10-CM | POA: Diagnosis not present

## 2016-07-17 DIAGNOSIS — R002 Palpitations: Secondary | ICD-10-CM | POA: Diagnosis not present

## 2016-07-17 DIAGNOSIS — M5136 Other intervertebral disc degeneration, lumbar region: Secondary | ICD-10-CM | POA: Diagnosis not present

## 2016-07-17 DIAGNOSIS — C186 Malignant neoplasm of descending colon: Secondary | ICD-10-CM | POA: Diagnosis not present

## 2016-07-17 DIAGNOSIS — I2510 Atherosclerotic heart disease of native coronary artery without angina pectoris: Secondary | ICD-10-CM | POA: Diagnosis not present

## 2016-07-17 DIAGNOSIS — E785 Hyperlipidemia, unspecified: Secondary | ICD-10-CM | POA: Diagnosis not present

## 2016-07-17 DIAGNOSIS — I252 Old myocardial infarction: Secondary | ICD-10-CM | POA: Diagnosis not present

## 2016-07-17 DIAGNOSIS — Z95810 Presence of automatic (implantable) cardiac defibrillator: Secondary | ICD-10-CM | POA: Diagnosis not present

## 2016-07-18 DIAGNOSIS — Z433 Encounter for attention to colostomy: Secondary | ICD-10-CM | POA: Diagnosis not present

## 2016-07-18 DIAGNOSIS — I130 Hypertensive heart and chronic kidney disease with heart failure and stage 1 through stage 4 chronic kidney disease, or unspecified chronic kidney disease: Secondary | ICD-10-CM | POA: Diagnosis not present

## 2016-07-18 DIAGNOSIS — Z483 Aftercare following surgery for neoplasm: Secondary | ICD-10-CM | POA: Diagnosis not present

## 2016-07-18 DIAGNOSIS — C186 Malignant neoplasm of descending colon: Secondary | ICD-10-CM | POA: Diagnosis not present

## 2016-07-18 DIAGNOSIS — I5022 Chronic systolic (congestive) heart failure: Secondary | ICD-10-CM | POA: Diagnosis not present

## 2016-07-18 DIAGNOSIS — I2510 Atherosclerotic heart disease of native coronary artery without angina pectoris: Secondary | ICD-10-CM | POA: Diagnosis not present

## 2016-07-20 ENCOUNTER — Telehealth: Payer: Self-pay | Admitting: *Deleted

## 2016-07-20 NOTE — Telephone Encounter (Signed)
Received vm call from pt stating that he has had severe diarrhea-mostly water in his colostomy & he is concerned b/c his bag gets full very quickly. Returned call @ 2:30 pm & left message at home & mobile # to call back.  Suggested imodium OTC, up to 8/d & increase PO fluids/gaterade & to call back to speak to RN.

## 2016-07-23 ENCOUNTER — Telehealth: Payer: Self-pay | Admitting: Family Medicine

## 2016-07-23 ENCOUNTER — Emergency Department (HOSPITAL_COMMUNITY)
Admission: EM | Admit: 2016-07-23 | Discharge: 2016-07-23 | Disposition: A | Discharge status: Home / Self Care | Payer: Medicare Other | Attending: Emergency Medicine | Admitting: Emergency Medicine

## 2016-07-23 ENCOUNTER — Encounter (HOSPITAL_COMMUNITY): Payer: Self-pay | Admitting: Obstetrics and Gynecology

## 2016-07-23 DIAGNOSIS — J449 Chronic obstructive pulmonary disease, unspecified: Secondary | ICD-10-CM | POA: Diagnosis not present

## 2016-07-23 DIAGNOSIS — R112 Nausea with vomiting, unspecified: Secondary | ICD-10-CM | POA: Insufficient documentation

## 2016-07-23 DIAGNOSIS — R197 Diarrhea, unspecified: Secondary | ICD-10-CM

## 2016-07-23 DIAGNOSIS — R531 Weakness: Secondary | ICD-10-CM | POA: Diagnosis not present

## 2016-07-23 DIAGNOSIS — E860 Dehydration: Secondary | ICD-10-CM | POA: Diagnosis not present

## 2016-07-23 DIAGNOSIS — Z7982 Long term (current) use of aspirin: Secondary | ICD-10-CM | POA: Diagnosis not present

## 2016-07-23 DIAGNOSIS — I2510 Atherosclerotic heart disease of native coronary artery without angina pectoris: Secondary | ICD-10-CM | POA: Insufficient documentation

## 2016-07-23 DIAGNOSIS — I130 Hypertensive heart and chronic kidney disease with heart failure and stage 1 through stage 4 chronic kidney disease, or unspecified chronic kidney disease: Secondary | ICD-10-CM | POA: Insufficient documentation

## 2016-07-23 DIAGNOSIS — R9431 Abnormal electrocardiogram [ECG] [EKG]: Secondary | ICD-10-CM | POA: Diagnosis not present

## 2016-07-23 DIAGNOSIS — R634 Abnormal weight loss: Secondary | ICD-10-CM | POA: Insufficient documentation

## 2016-07-23 DIAGNOSIS — I5022 Chronic systolic (congestive) heart failure: Secondary | ICD-10-CM | POA: Insufficient documentation

## 2016-07-23 DIAGNOSIS — R100 Acute abdomen: Secondary | ICD-10-CM | POA: Diagnosis not present

## 2016-07-23 DIAGNOSIS — R1030 Lower abdominal pain, unspecified: Secondary | ICD-10-CM | POA: Diagnosis present

## 2016-07-23 DIAGNOSIS — Z79899 Other long term (current) drug therapy: Secondary | ICD-10-CM | POA: Diagnosis not present

## 2016-07-23 DIAGNOSIS — R079 Chest pain, unspecified: Secondary | ICD-10-CM | POA: Diagnosis not present

## 2016-07-23 DIAGNOSIS — Z955 Presence of coronary angioplasty implant and graft: Secondary | ICD-10-CM | POA: Diagnosis not present

## 2016-07-23 DIAGNOSIS — Z95810 Presence of automatic (implantable) cardiac defibrillator: Secondary | ICD-10-CM | POA: Insufficient documentation

## 2016-07-23 DIAGNOSIS — Z87891 Personal history of nicotine dependence: Secondary | ICD-10-CM | POA: Diagnosis not present

## 2016-07-23 DIAGNOSIS — C187 Malignant neoplasm of sigmoid colon: Secondary | ICD-10-CM | POA: Insufficient documentation

## 2016-07-23 DIAGNOSIS — N189 Chronic kidney disease, unspecified: Secondary | ICD-10-CM | POA: Insufficient documentation

## 2016-07-23 HISTORY — DX: Malignant (primary) neoplasm, unspecified: C80.1

## 2016-07-23 LAB — URINALYSIS, ROUTINE W REFLEX MICROSCOPIC
Bilirubin Urine: NEGATIVE
Glucose, UA: NEGATIVE mg/dL
Hgb urine dipstick: NEGATIVE
Ketones, ur: 20 mg/dL — AB
Leukocytes, UA: NEGATIVE
Nitrite: NEGATIVE
Protein, ur: NEGATIVE mg/dL
Specific Gravity, Urine: 1.013 (ref 1.005–1.030)
pH: 5 (ref 5.0–8.0)

## 2016-07-23 LAB — CBC WITH DIFFERENTIAL/PLATELET
Basophils Absolute: 0 10*3/uL (ref 0.0–0.1)
Basophils Relative: 0 %
Eosinophils Absolute: 0.1 10*3/uL (ref 0.0–0.7)
Eosinophils Relative: 1 %
HCT: 42.3 % (ref 39.0–52.0)
Hemoglobin: 15 g/dL (ref 13.0–17.0)
Lymphocytes Relative: 21 %
Lymphs Abs: 2.1 10*3/uL (ref 0.7–4.0)
MCH: 29.5 pg (ref 26.0–34.0)
MCHC: 35.5 g/dL (ref 30.0–36.0)
MCV: 83.3 fL (ref 78.0–100.0)
Monocytes Absolute: 0.5 10*3/uL (ref 0.1–1.0)
Monocytes Relative: 5 %
Neutro Abs: 7.2 10*3/uL (ref 1.7–7.7)
Neutrophils Relative %: 73 %
Platelets: 146 10*3/uL — ABNORMAL LOW (ref 150–400)
RBC: 5.08 MIL/uL (ref 4.22–5.81)
RDW: 16.3 % — ABNORMAL HIGH (ref 11.5–15.5)
WBC: 9.9 10*3/uL (ref 4.0–10.5)

## 2016-07-23 LAB — COMPREHENSIVE METABOLIC PANEL
ALT: 27 U/L (ref 17–63)
AST: 25 U/L (ref 15–41)
Albumin: 4.3 g/dL (ref 3.5–5.0)
Alkaline Phosphatase: 57 U/L (ref 38–126)
Anion gap: 11 (ref 5–15)
BUN: 30 mg/dL — ABNORMAL HIGH (ref 6–20)
CO2: 15 mmol/L — ABNORMAL LOW (ref 22–32)
Calcium: 9.5 mg/dL (ref 8.9–10.3)
Chloride: 106 mmol/L (ref 101–111)
Creatinine, Ser: 1.3 mg/dL — ABNORMAL HIGH (ref 0.61–1.24)
GFR calc Af Amer: >60 mL/min (ref >60)
GFR calc non Af Amer: >60 mL/min (ref >60)
Glucose, Bld: 93 mg/dL (ref 65–99)
Potassium: 5.2 mmol/L — ABNORMAL HIGH (ref 3.5–5.1)
Sodium: 132 mmol/L — ABNORMAL LOW (ref 135–145)
Total Bilirubin: 0.7 mg/dL (ref 0.3–1.2)
Total Protein: 7.3 g/dL (ref 6.5–8.1)

## 2016-07-23 LAB — TROPONIN I: Troponin I: <0.03 ng/mL (ref <0.03)

## 2016-07-23 MED ORDER — LOPERAMIDE HCL 2 MG PO CAPS: 2.0000 mg | ORAL_CAPSULE | Freq: Four times a day (QID) | ORAL | 0 refills | Status: DC | PRN

## 2016-07-23 MED ORDER — FAMOTIDINE 20 MG PO TABS: 20.0000 mg | ORAL_TABLET | Freq: Two times a day (BID) | ORAL | 0 refills | Status: DC

## 2016-07-23 MED ORDER — ONDANSETRON HCL 4 MG/2ML IJ SOLN
4.0000 mg | Freq: Once | INTRAMUSCULAR | Status: AC
  Administered 2016-07-23: 4 mg via INTRAVENOUS
  Filled 2016-07-23: qty 2

## 2016-07-23 MED ORDER — SODIUM CHLORIDE 0.9 % IV BOLUS (SEPSIS)
1000.0000 mL | Freq: Once | INTRAVENOUS | Status: AC
  Administered 2016-07-23: 1000 mL via INTRAVENOUS

## 2016-07-23 MED ORDER — ONDANSETRON HCL 4 MG PO TABS: 4.0000 mg | ORAL_TABLET | Freq: Four times a day (QID) | ORAL | 0 refills | Status: DC

## 2016-07-23 MED ORDER — GI COCKTAIL ~~LOC~~: 30.0000 mL | Freq: Once | ORAL | Status: DC

## 2016-07-23 MED ORDER — LOPERAMIDE HCL 2 MG PO CAPS
4.0000 mg | ORAL_CAPSULE | Freq: Once | ORAL | Status: AC
  Administered 2016-07-23: 4 mg via ORAL
  Filled 2016-07-23: qty 2

## 2016-07-23 MED ORDER — MORPHINE SULFATE (PF) 4 MG/ML IV SOLN
4.0000 mg | Freq: Once | INTRAVENOUS | Status: AC
  Administered 2016-07-23: 4 mg via INTRAVENOUS
  Filled 2016-07-23: qty 1

## 2016-07-23 NOTE — Telephone Encounter (Signed)
Adv. Mary Esther is requesting Verbal orders to get social worker to make home visits to patient's home and connect him to resource to  assist him in his health.

## 2016-07-23 NOTE — ED Notes (Signed)
Bed: WU98 Expected date:  Expected time:  Means of arrival:  Comments: EMS 56 y/o abd pain, recent colon cancer surgery

## 2016-07-23 NOTE — ED Triage Notes (Signed)
Per EMS: Pt is coming from home. Pt had surgery on April 20 to remove some colon cancer. Pt states he has lost 10 lbs in the last couple of days. Pt has felt nauseous for the last couple of days but has not vomited. Pt reports diarrhea but he has a colostomy.   Last vitals:  BP 100/70 HR 96 O2 98% on RA 113 CBG

## 2016-07-23 NOTE — Telephone Encounter (Signed)
Christopher [name] was called and a VM was left giving verbal orders for social worker to visit home.

## 2016-07-23 NOTE — ED Triage Notes (Signed)
Pt reports pain below end incision from his surgery back in April. Pt reports it may be a side effect of medication.

## 2016-07-23 NOTE — Telephone Encounter (Signed)
Okay to give verbal orders?  ?

## 2016-07-23 NOTE — ED Notes (Signed)
RN will pull from IV line for blood work

## 2016-07-23 NOTE — ED Provider Notes (Signed)
[facility] DEPT Provider Note   CSN: [id number] Arrival date & time: 07/23/16  1142  By signing my name below, I, Hansel Feinstein, attest that this documentation has been prepared under the direction and in the presence of Virgel Manifold, MD. Electronically Signed: Hansel Feinstein, ED Scribe. 07/23/16. 12:37 PM.    History   Chief Complaint Chief Complaint  Patient presents with  . Abdominal Pain    HPI Christopher [name] is a 56 y.o. male with h/o colon cancer s/p colectomy with colostomy placement 05/18/16 with subsequent staph infection who presents to the Emergency Department complaining of moderate lower abdominal pain that began 12 days ago with associated nausea, intestinal "gurgling" that radiates to his esophagus. He describes his abdominal pain as a "tightness". Pt also reports 10 lb weight loss and watery diarrhea x3 days after starting a new cancer medication 6/19. He has tried imodium with no relief. Pt states he has not been medicated for reflux for several weeks. Pt has h/o cardiac stents and cardiac cath. He denies fever, dysuria, difficulty urinating.   The history is provided by the patient. No language interpreter was used.    Past Medical History:  Diagnosis Date  . Automatic implantable cardioverter-defibrillator in situ    MDT Aug 2015 Dr. Lovena Le  . CAD (coronary artery disease), native coronary artery 07/03/2013   Cath 06/20/13  Normal left main, occluded LAD, occluded RCA, 50% circ EF 15%  3.0 x28 and 3.0 x 8 mm Xience stent Dr. Tamala Julian  To LAD   . Cancer ([place])   . Chronic kidney disease   . Chronic systolic CHF (congestive heart failure) (HCC)    ECHO 08/05/13  EF 30%  Anterior akinesis and inferior hypokinesis   . COPD (chronic obstructive pulmonary disease) ([place])   . Hyperlipidemia   . Mass of colon 04/2016  . Myocardial infarction ([place]) 04/29/2013  . Thrombocytopenia ([place]) 12/27/2014   Chronic      Patient Active Problem List   Diagnosis Date Noted  . Iron  deficiency anemia due to chronic blood loss 07/02/2016  . Wound disruption, post-op, skin, sequela 06/14/2016  . Adenocarcinoma of descending colon  pT3, pN1a, pMX s/p colectomy/ostomy 05/18/2016 05/26/2016  . Partial bowel obstruction ([place]) 05/16/2016  . Essential hypertension 05/16/2016  . Anemia due to blood loss 05/03/2016  . Anxiety state 01/17/2015  . Cardiomyopathy, ischemic   . Thrombocytopenia ([place]) 12/27/2014  . AICD (automatic cardioverter/defibrillator) present   . Financial difficulties 10/01/2013  . CAD (coronary artery disease), native coronary artery 07/03/2013  . Tobacco use 07/03/2013  . Chronic systolic CHF (congestive heart failure) ([place])   . Hyperlipidemia   . Old anterior myocardial infarction 06/20/2013    Past Surgical History:  Procedure Laterality Date  . CARDIAC CATHETERIZATION  05/2013   . CARDIAC CATHETERIZATION N/A 12/28/2014   Procedure: Left Heart Cath and Coronary Angiography;  Surgeon: [name], MD;  Location: [facility];  Service: Cardiovascular;  Laterality: N/A;  . CARDIAC CATHETERIZATION N/A 12/28/2014   Procedure: Coronary Stent Intervention;  Surgeon: [name], MD;  Location: [facility];  Service: Cardiovascular;  Laterality: N/A;  . COLECTOMY WITH COLOSTOMY CREATION/HARTMANN PROCEDURE Left 05/18/2016   Procedure: COLECTOMY WITH OSTOMY CREATION/HARTMANN PROCEDURE;  Surgeon: [name], MD;  Location: [facility];  Service: General;  Laterality: Left;  . CORONARY ANGIOPLASTY  05/2013   . FLEXIBLE SIGMOIDOSCOPY N/A 05/16/2016   Procedure: FLEXIBLE SIGMOIDOSCOPY;  Surgeon: [name], MD;  Location:  [facility];  Service: Gastroenterology;  Laterality: N/A;  . ICD placement  09/07/2013   . IMPLANTABLE CARDIOVERTER DEFIBRILLATOR IMPLANT N/A 09/07/2013   Procedure: IMPLANTABLE CARDIOVERTER DEFIBRILLATOR IMPLANT;  Surgeon: [name], MD;  Location: [facility] LAB;  Service: Cardiovascular;  Laterality: N/A;  .  INTRA-AORTIC BALLOON PUMP INSERTION  06/20/2013   Procedure: INTRA-AORTIC BALLOON PUMP INSERTION;  Surgeon: Sinclair Grooms, MD;  Location[facility] C[facility] LAB;  Service: Cardiovascular;;  . LEFT HEART CATHETERIZATION WITH CORONARY ANGIOGRAM N/A 06/20/2013   Procedure: LEFT HEART CATHETERIZATION WITH CORONARY ANGIOGRAM;  Surgeon: Sinclair Grooms, MD;  Location: [facility] CATH LAB;  Service: Cardiovascular;  Laterality: N/A;  . PERCUTANEOUS CORONARY STENT INTERVENTION (PCI-S)  06/20/2013   Procedure: PERCUTANEOUS CORONARY STENT INTERVENTION (PCI-S);  Surgeon: Sinclair Grooms, MD;  Location: [facility] CATH LAB;  Service: Cardiovascular;;       Home Medications    Prior to Admission medications   Medication Sig Start Date End Date Taking? Authorizing Provider  aspirin 81 MG chewable tablet Chew 1 tablet (81 mg total) by mouth daily. 09/09/13  Yes Theodis Blaze, MD  atorvastatin (LIPITOR) 80 MG tablet Take 1 tablet (80 mg total) by mouth daily at 6 PM. 09/09/13  Yes Theodis Blaze, MD  capecitabine (XELODA) 500 MG tablet Take 4 tablets (2,000 mg total) by mouth 2 (two) times daily after a meal. Take for 2 weeks on, 1 week off 07/06/16  Yes [name], MD  carvedilol (COREG) 3.125 MG tablet Take 1 tablet (3.125 mg total) by mouth 2 (two) times daily with a meal. 05/29/16  Yes Velvet Bathe, MD  clopidogrel (PLAVIX) 75 MG tablet Take 75 mg by mouth daily.   Yes [provider]  digoxin (LANOXIN) 0.125 MG tablet Take 1 tablet (0.125 mg total) by mouth daily. 09/09/13  Yes Theodis Blaze, MD  feeding supplement, ENSURE ENLIVE, (ENSURE ENLIVE) LIQD Take 237 mLs by mouth 3 (three) times daily between meals. 05/29/16  Yes Velvet Bathe, MD  ferrous sulfate 325 (65 FE) MG tablet Take 1 tablet (325 mg total) by mouth 2 (two) times daily with a meal. 05/03/16  Yes Funches, Josalyn, MD  lisinopril (PRINIVIL,ZESTRIL) 2.5 MG tablet Take 1 tablet (2.5 mg total) by mouth daily. 09/09/13  Yes Theodis Blaze, MD  Multiple Vitamin  (MULTIVITAMIN WITH MINERALS) TABS tablet Take 1 tablet by mouth daily. 05/30/16  Yes Velvet Bathe, MD  nitroGLYCERIN (NITROSTAT) 0.4 MG SL tablet Place 1 tablet (0.4 mg total) under the tongue every 5 (five) minutes as needed for chest pain. 05/29/16  Yes Velvet Bathe, MD  pantoprazole (PROTONIX) 40 MG tablet Take 1 tablet (40 mg total) by mouth 2 (two) times daily. 05/29/16  Yes Velvet Bathe, MD  saccharomyces boulardii (FLORASTOR) 250 MG capsule Take 1 capsule (250 mg total) by mouth 2 (two) times daily. 05/29/16  Yes Velvet Bathe, MD  Na Sulfate-K Sulfate-Mg Sulf 17.5-3.13-1.6 GM/180ML SOLN Suprep-Use as directed 05/11/16   Zehr, Laban Emperor, PA-C    Family History Family History  Problem Relation Age of Onset  . Emphysema Father   . Heart disease Mother   . Dementia Mother   . Diabetes Mother   . Pancreatic cancer Maternal Grandmother   . Lung cancer Maternal Grandmother   . Cancer Neg Hx     Social History Social History  Substance Use Topics  . Smoking status: Former Smoker    Packs/day: 2.00    Years: 40.00  Types: Cigarettes    Quit date: 12/22/2014  . Smokeless tobacco: Never Used  . Alcohol use No     Comment: quit in 08/2013, he used to drink alcohol moderate (1 pine liquor one week)  for 30 years      Allergies   Celebrex [celecoxib] and Contrast media [iodinated diagnostic agents]   Review of Systems Review of Systems  Constitutional: Positive for unexpected weight change. Negative for fever.  Cardiovascular: Positive for chest pain.  Gastrointestinal: Positive for abdominal pain and nausea.  Genitourinary: Negative for difficulty urinating and dysuria.  All other systems reviewed and are negative.    Physical Exam Updated Vital Signs BP 110/83 (BP Location: Left Arm)   Pulse 98   Temp 97.9 F (36.6 C) (Oral)   Resp 20   SpO2 100%   Physical Exam  Constitutional: He appears well-developed and well-nourished. No distress.  HENT:  Head: Normocephalic  and atraumatic.  Mouth/Throat: Oropharynx is clear and moist. No oropharyngeal exudate.  Eyes: Conjunctivae and EOM are normal. Pupils are equal, round, and reactive to light. Right eye exhibits no discharge. Left eye exhibits no discharge. No scleral icterus.  Neck: Normal range of motion. Neck supple. No JVD present. No thyromegaly present.  Cardiovascular: Normal rate, regular rhythm, normal heart sounds and intact distal pulses.  Exam reveals no gallop and no friction rub.   No murmur heard. Pulmonary/Chest: Effort normal and breath sounds normal. No respiratory distress. He has no wheezes. He has no rales.  Abdominal: Soft. Bowel sounds are normal. He exhibits no distension and no mass. There is no tenderness.  Colostomy with small amount of watery stool . Laparotomy incision dehisced mid aspect with healthy appearing granulation tissue at base.   Musculoskeletal: Normal range of motion. He exhibits no edema or tenderness.  Lymphadenopathy:    He has no cervical adenopathy.  Neurological: He is alert. Coordination normal.  Skin: Skin is warm and dry. No rash noted. No erythema.  Psychiatric: He has a normal mood and affect. His behavior is normal.  Nursing note and vitals reviewed.    ED Treatments / Results   DIAGNOSTIC STUDIES: Oxygen Saturation is 100% on RA, normal by my interpretation.    COORDINATION OF CARE: 12:31 PM Discussed treatment plan with pt at bedside which includes labs and pt agreed to plan.    Labs (all labs ordered are listed, but only abnormal results are displayed) Labs Reviewed - No data to display  EKG  EKG Interpretation None       Radiology No results found.  Procedures Procedures (including critical care time)  Medications Ordered in ED Medications - No data to display   Initial Impression / Assessment and Plan / ED Course  I have reviewed the triage vital signs and the nursing notes.  Pertinent labs & imaging results that were  available during my care of the patient were reviewed by me and considered in my medical decision making (see chart for details).     55yM with abdominal pain and diarrhea. May be related to chemo. Afebrile. Minimal suprapubic tenderness. Labs consistent with dehydration. Watery stool and decrease appetite. IVF. Imodium. Can continue to take immodium PRN at home. No fever. NO blood in stool. On reassessment, he is expressing concern that his abdominal pain may be cardiac. I doubt it. It is suprapubic and generally sounds very atypical. He says prior to stent placement he just had abdominal pain. Will chest a troponin. If negative, I think he  can be discharge. Push fluids. Needs to discuss with oncology about his concerns of his chemo tx.   Final Clinical Impressions(s) / ED Diagnoses   Final diagnoses:  Diarrhea, unspecified type  Dehydration    New Prescriptions New Prescriptions   No medications on file    I personally preformed the services scribed in my presence. The recorded information has been reviewed is accurate. Virgel Manifold, MD.    Virgel Manifold, MD 07/23/16 [phone]

## 2016-07-24 ENCOUNTER — Telehealth: Payer: Self-pay | Admitting: *Deleted

## 2016-07-24 NOTE — Telephone Encounter (Signed)
Pt called and left message requesting a call back from nurse about Xeloda.   Called pt back with no answer.   Left message on voice mail requesting a call back to nurse.

## 2016-07-25 ENCOUNTER — Encounter: Payer: Self-pay | Admitting: Hematology

## 2016-07-25 DIAGNOSIS — Z483 Aftercare following surgery for neoplasm: Secondary | ICD-10-CM | POA: Diagnosis not present

## 2016-07-25 DIAGNOSIS — I5022 Chronic systolic (congestive) heart failure: Secondary | ICD-10-CM | POA: Diagnosis not present

## 2016-07-25 DIAGNOSIS — I130 Hypertensive heart and chronic kidney disease with heart failure and stage 1 through stage 4 chronic kidney disease, or unspecified chronic kidney disease: Secondary | ICD-10-CM | POA: Diagnosis not present

## 2016-07-25 DIAGNOSIS — C186 Malignant neoplasm of descending colon: Secondary | ICD-10-CM | POA: Diagnosis not present

## 2016-07-25 DIAGNOSIS — Z433 Encounter for attention to colostomy: Secondary | ICD-10-CM | POA: Diagnosis not present

## 2016-07-25 DIAGNOSIS — I2510 Atherosclerotic heart disease of native coronary artery without angina pectoris: Secondary | ICD-10-CM | POA: Diagnosis not present

## 2016-07-25 NOTE — Progress Notes (Signed)
Rcvd email from [facility] that pt had financial concerns.  Called and left message requesting a call back to discuss what his concerns are.

## 2016-07-26 ENCOUNTER — Telehealth: Payer: Self-pay | Admitting: *Deleted

## 2016-07-26 NOTE — Telephone Encounter (Signed)
"  Make Dr. Burr Medico aware I only took the chemotherapy medication for two and a half days, starting at 7:45 pm 07-17-2016 through 07-19-2016.  I ended up in the hospital with dehydration from diarrhea emptying the colostomy six times a day.  That was scary with fluid flying out my colostomy.  Diarrhea is hard on the kidneys and I have kidney problems anyway.  We need another plan because I cannot take this medicine.  The dose must be too strong.  I'd rather take a pill than another hole drilled in me for chemotherapy.  I was not able to eat, on the verge of throwing up.  Imodium has helped normalize things.  Did not complete two weeks and I will not restart this medicine.  I'll see her July 2nd."

## 2016-07-30 ENCOUNTER — Other Ambulatory Visit (HOSPITAL_BASED_OUTPATIENT_CLINIC_OR_DEPARTMENT_OTHER): Payer: Medicare Other

## 2016-07-30 ENCOUNTER — Ambulatory Visit (HOSPITAL_BASED_OUTPATIENT_CLINIC_OR_DEPARTMENT_OTHER): Payer: Medicare Other | Admitting: Hematology

## 2016-07-30 ENCOUNTER — Encounter: Payer: Self-pay | Admitting: Hematology

## 2016-07-30 ENCOUNTER — Telehealth: Payer: Self-pay | Admitting: Hematology

## 2016-07-30 ENCOUNTER — Encounter: Payer: Self-pay | Admitting: *Deleted

## 2016-07-30 VITALS — BP 95/69 | HR 81 | Temp 97.7°F | Resp 18 | Ht 68.0 in | Wt 142.0 lb

## 2016-07-30 DIAGNOSIS — C189 Malignant neoplasm of colon, unspecified: Secondary | ICD-10-CM

## 2016-07-30 DIAGNOSIS — C186 Malignant neoplasm of descending colon: Secondary | ICD-10-CM | POA: Diagnosis not present

## 2016-07-30 DIAGNOSIS — K521 Toxic gastroenteritis and colitis: Secondary | ICD-10-CM | POA: Diagnosis not present

## 2016-07-30 DIAGNOSIS — I25110 Atherosclerotic heart disease of native coronary artery with unstable angina pectoris: Secondary | ICD-10-CM

## 2016-07-30 LAB — CBC WITH DIFFERENTIAL/PLATELET
BASO%: 0.5 % (ref 0.0–2.0)
Basophils Absolute: 0 10*3/uL (ref 0.0–0.1)
EOS ABS: 0.2 10*3/uL (ref 0.0–0.5)
EOS%: 3.2 % (ref 0.0–7.0)
HCT: 37.7 % — ABNORMAL LOW (ref 38.4–49.9)
HEMOGLOBIN: 12.5 g/dL — AB (ref 13.0–17.1)
LYMPH#: 1.9 10*3/uL (ref 0.9–3.3)
LYMPH%: 28.6 % (ref 14.0–49.0)
MCH: 29.3 pg (ref 27.2–33.4)
MCHC: 33.2 g/dL (ref 32.0–36.0)
MCV: 88.3 fL (ref 79.3–98.0)
MONO#: 0.5 10*3/uL (ref 0.1–0.9)
MONO%: 7.9 % (ref 0.0–14.0)
NEUT%: 59.8 % (ref 39.0–75.0)
NEUTROS ABS: 4.1 10*3/uL (ref 1.5–6.5)
PLATELETS: 132 10*3/uL — AB (ref 140–400)
RBC: 4.27 10*6/uL (ref 4.20–5.82)
RDW: 19.9 % — AB (ref 11.0–14.6)
WBC: 6.8 10*3/uL (ref 4.0–10.3)

## 2016-07-30 LAB — COMPREHENSIVE METABOLIC PANEL
ALBUMIN: 3.6 g/dL (ref 3.5–5.0)
ALK PHOS: 51 U/L (ref 40–150)
ALT: 18 U/L (ref 0–55)
ANION GAP: 11 meq/L (ref 3–11)
AST: 19 U/L (ref 5–34)
BILIRUBIN TOTAL: 0.24 mg/dL (ref 0.20–1.20)
BUN: 19.6 mg/dL (ref 7.0–26.0)
CO2: 28 meq/L (ref 22–29)
Calcium: 9.9 mg/dL (ref 8.4–10.4)
Chloride: 103 mEq/L (ref 98–109)
Creatinine: 1.3 mg/dL (ref 0.7–1.3)
EGFR: 65 mL/min/{1.73_m2} — ABNORMAL LOW (ref >90)
GLUCOSE: 122 mg/dL (ref 70–140)
Potassium: 4.9 mEq/L (ref 3.5–5.1)
Sodium: 141 mEq/L (ref 136–145)
TOTAL PROTEIN: 6.4 g/dL (ref 6.4–8.3)

## 2016-07-30 MED ORDER — DIPHENOXYLATE-ATROPINE 2.5-0.025 MG PO TABS: 2.0000 | ORAL_TABLET | Freq: Four times a day (QID) | ORAL | 3 refills | Status: DC | PRN

## 2016-07-30 NOTE — Progress Notes (Signed)
[facility] [employer]  [employer] representative received referral for financial concerns.  CSW met with patient in clinic at [facility] to offer support and assess for needs.  Patient stated he worked with the pharmacy and qualified for a program.  His co pay would be $0 for his medication.  CSW offer additional support and patient stated he had no additional needs at this time.  CSW provided contact information and encouraged patient to call with needs or concerns.   Johnnye Lana, MSW, LCSW, OSW-C Clinical Social Worker [facility] [phone]

## 2016-07-30 NOTE — Progress Notes (Signed)
[place]  [phone] [phone]  Clinic Follow-up Note   Patient Care Team: [name], MD as PCP - General (Family Medicine) [name], MD as Consulting Physician (Cardiology) [name], MD as Consulting Physician (Cardiology) [name], MD as Consulting Physician (Hematology) [name], MD as Consulting Physician (General Surgery) 07/30/2016  CHIEF COMPLAINTS:  Follow up left side colon cancer, stage IIIB  Oncology History   Cancer Staging Adenocarcinoma of descending colon  pT3, pN1a, pMX s/p colectomy/ostomy 05/18/2016 Staging form: Colon and Rectum, AJCC 8th Edition - Pathologic stage from 05/18/2016: Stage IIIB (pT3, pN1a, cM0) - Signed by [name], MD on 07/01/2016       Adenocarcinoma of descending colon  pT3, pN1a, pMX s/p colectomy/ostomy 05/18/2016   05/09/2016 Imaging    CT chest, abdomen and pelvis showed probable descending colon carcinoma. No evidence of distant metastasis. 7 cm hypervascular focus in the upper liver, not typical for metastatic disease.      05/16/2016 Procedure    Colonoscopy showed a large mass which completely obstructss the distal descending colon. Biopsied. Diverticulosis in the left colon.      05/18/2016 Surgery    Left hemicolectomy by Dr. [name]      05/18/2016 Pathology Results    Left colon segmental resection shows invasive colorectal adenocarcinoma, 4.6 cm, tumor extends into subserosa connective tissue, margins are negative, metastatic carcinoma in 1 of 12 lymph nodes. Grade 2, lymphovascular invasion present, perineural invasion negative.      05/18/2016 Miscellaneous    MSI-stable       05/18/2016 Initial Diagnosis    Adenocarcinoma of descending colon  pT3, pN1a, pMX s/p colectomy/ostomy 05/18/2016      07/16/2016 -  Chemotherapy    Xeloda twice a day for 2 weeks on and 1 week off.          HISTORY OF PRESENTING ILLNESS:  07/02/16 Christopher [name] 56 y.o.  male is here because of recent diagnosis of Invasive Colorectal Adenocarcinoma of the Colon. Initially he was bleeding on and off and had a a lot of gas and constipation. He had a [place] in December and that did not cover his bowel. He also felt a line appearing on his stomach. These symptoms in October. He also had tingling. He went to ED 05/09/16 for syncope. There he had a CT AP scan that showed evidence of a mass in his colon. Followed by a live Colonoscopy and biopsy on 4/18 done by Dr. [name] showing evidence the mass was cancer. He had a colectomy surgery 05/18/16 and the results showed to be Invasive olorectal Adenocarcinoma of the Colon. During his colonoscopy process he could not complete pre treatment  Because the cancer cut it off the path to his bowel and caused him to throw up with blood, so he did a live colonoscopy. PCP is Dr. [name]. He had gotten staph epidermitis in his incision while in the hospital his incision is half healed now.   He has a history of a Psychologist, forensic. He had 2 heart attacks and 2 stents placed and CHF.   He presents to the clinic today post colectomy. He lives by himself and he feels Ok now but he feels like he is not prepared for chemo and the sickness that comes with it. He was told to change his dressing every 3 days and he now changes it everyday. He had painful swallowing after surgery which has resolved and some ulceration in his  mouth seen by his dentist. He is taking ferrous sulfate. He has a lot of stress because he is not sure what was really going on. He is taking baby aspirin. His appetite and energy level is back to normal. He lost weight after surgery. He lost 20 pounds since surgery. He has no feet swelling and he pain on his chest around his pace maker which was put in in August 2015. He gets regularly checked for HIV. He wants to know why he cannot use target therapy. He does not want to do IV Chemotherapy. His regular cardiologist keeps him on his regular heart  medication and after the surgery he got nitro. He is going to see his cardiologist on the 19th.    CURRENT THERAPY: adjuvnat Xeloda 2030m bid, two weeks on and one week off, started on 07/17/2016, stopped after 3 days due to severe diarrhea, and changed to 15054mbid, starting on 07/31/2016    INTERVAL HISTORY:  HaOSIE AMPAROs here for a follow-up. He presents to the clinic today reporting he had a reaction to Xeloda. He took the medication and started having diarrhea and he lost 10 pounds in 1 week.he took imodium (4 in a day) and that did not help. He emptied the bag 4 times when half full becasue he could not let it get full. He went to hospital and got fluids because he was dehydrated. He stopped taking the medication since. Afterward he recovered faster.   Normally he would empty his bag, everyday and it would be solid.   He got Xeloda free for a year so he no longer needs to speak with our soEducation officer, museum     MEDICAL HISTORY:  Past Medical History:  Diagnosis Date  . Automatic implantable cardioverter-defibrillator in situ    MDT Aug 2015 Dr. TaLovena Le. CAD (coronary artery disease), native coronary artery 07/03/2013   Cath 06/20/13  Normal left main, occluded LAD, occluded RCA, 50% circ EF 15%  3.0 x28 and 3.0 x 8 mm Xience stent Dr. SmTamala JulianTo LAD   . Cancer ([facility]  . Chronic kidney disease   . Chronic systolic CHF (congestive heart failure) (HCC)    ECHO 08/05/13  EF 30%  Anterior akinesis and inferior hypokinesis   . COPD (chronic obstructive pulmonary disease) ([facility]  . Hyperlipidemia   . Mass of colon 04/2016  . Myocardial infarction ([facility]04/01/2013  . Thrombocytopenia ([facility]11/28/2016   Chronic      SURGICAL HISTORY: Past Surgical History:  Procedure Laterality Date  . CARDIAC CATHETERIZATION  05/2013   . CARDIAC CATHETERIZATION N/A 12/28/2014   Procedure: Left Heart Cath and Coronary Angiography;  Surgeon: ThTroy SineMD;  [facility];  Service:  Cardiovascular;  Laterality: N/A;  . CARDIAC CATHETERIZATION N/A 12/28/2014   Procedure: Coronary Stent Intervention;  Surgeon: ThTroy SineMD;  [facility];  Service: Cardiovascular;  Laterality: N/A;  . COLECTOMY WITH COLOSTOMY CREATION/HARTMANN PROCEDURE Left 05/18/2016   Procedure: COLECTOMY WITH OSTOMY CREATION/HARTMANN PROCEDURE;  Surgeon: BuGeorganna SkeansMD;  [facility] Service: General;  Laterality: Left;  . CORONARY ANGIOPLASTY  05/2013   . FLEXIBLE SIGMOIDOSCOPY N/A 05/16/2016   Procedure: FLEXIBLE SIGMOIDOSCOPY;  Surgeon: HeDoran StablerMD;  [facility] Service: Gastroenterology;  Laterality: N/A;  . ICD placement  09/07/2013   . IMPLANTABLE CARDIOVERTER DEFIBRILLATOR IMPLANT N/A 09/07/2013   Procedure: IMPLANTABLE CARDIOVERTER DEFIBRILLATOR IMPLANT;  Surgeon: [name], MD;  Location[facility] CATH LAB;  Service: Cardiovascular;  Laterality: N/A;  . INTRA-AORTIC BALLOON PUMP INSERTION  06/20/2013   Procedure: INTRA-AORTIC BALLOON PUMP INSERTION;  Surgeon: Sinclair Grooms, MD;  Location: [facility] CATH LAB;  Service: Cardiovascular;;  . LEFT HEART CATHETERIZATION WITH CORONARY ANGIOGRAM N/A 06/20/2013   Procedure: LEFT HEART CATHETERIZATION WITH CORONARY ANGIOGRAM;  Surgeon: Sinclair Grooms, MD;  Location: [facility] CATH LAB;  Service: Cardiovascular;  Laterality: N/A;  . PERCUTANEOUS CORONARY STENT INTERVENTION (PCI-S)  06/20/2013   Procedure: PERCUTANEOUS CORONARY STENT INTERVENTION (PCI-S);  Surgeon: Sinclair Grooms, MD;  Location: [facility] CATH LAB;  Service: Cardiovascular;;    SOCIAL HISTORY: Social History   Social History  . Marital status: Single    Spouse name: N/A  . Number of children: 0  . Years of education: 12   Occupational History  . Unemployed     Social History Main Topics  . Smoking status: Former Smoker    Packs/day: 2.00    Years: 40.00    Types: Cigarettes    Quit date: 12/22/2014  . Smokeless tobacco: Never Used  . Alcohol  use No     Comment: quit in 08/2013, he used to drink alcohol moderate (1 pine liquor one week)  for 30 years   . Drug use: No  . Sexual activity: Not Currently    Birth control/ protection: Condom     Comment: men    Other Topics Concern  . Not on file   Social History Narrative   Lives alone.    In a house trying to sell his home. Exercise: No.    FAMILY HISTORY: Family History  Problem Relation Age of Onset  . Emphysema Father   . Heart disease Mother   . Dementia Mother   . Diabetes Mother   . Pancreatic cancer Maternal Grandmother   . Lung cancer Maternal Grandmother   . Cancer Neg Hx     ALLERGIES:  is allergic to celebrex [celecoxib] and contrast media [iodinated diagnostic agents].  MEDICATIONS:  Current Outpatient Prescriptions  Medication Sig Dispense Refill  . aspirin 81 MG chewable tablet Chew 1 tablet (81 mg total) by mouth daily. 30 tablet 5  . atorvastatin (LIPITOR) 80 MG tablet Take 1 tablet (80 mg total) by mouth daily at 6 PM. 30 tablet 5  . capecitabine (XELODA) 500 MG tablet Take 4 tablets (2,000 mg total) by mouth 2 (two) times daily after a meal. Take for 2 weeks on, 1 week off 112 tablet 2  . carvedilol (COREG) 3.125 MG tablet Take 1 tablet (3.125 mg total) by mouth 2 (two) times daily with a meal. 60 tablet 0  . clopidogrel (PLAVIX) 75 MG tablet Take 75 mg by mouth daily.    . digoxin (LANOXIN) 0.125 MG tablet Take 1 tablet (0.125 mg total) by mouth daily. 30 tablet 5  . diphenoxylate-atropine (LOMOTIL) 2.5-0.025 MG tablet Take 2 tablets by mouth 4 (four) times daily as needed for diarrhea or loose stools. 30 tablet 3  . famotidine (PEPCID) 20 MG tablet Take 1 tablet (20 mg total) by mouth 2 (two) times daily. 30 tablet 0  . feeding supplement, ENSURE ENLIVE, (ENSURE ENLIVE) LIQD Take 237 mLs by mouth 3 (three) times daily between meals. 237 mL 12  . ferrous sulfate 325 (65 FE) MG tablet Take 1 tablet (325 mg total) by mouth 2 (two) times daily with a  meal. 60 tablet 3  . lisinopril (  PRINIVIL,ZESTRIL) 2.5 MG tablet Take 1 tablet (2.5 mg total) by mouth daily. 30 tablet 5  . loperamide (IMODIUM) 2 MG capsule Take 1 capsule (2 mg total) by mouth 4 (four) times daily as needed for diarrhea or loose stools. 12 capsule 0  . Multiple Vitamin (MULTIVITAMIN WITH MINERALS) TABS tablet Take 1 tablet by mouth daily. 30 tablet 0  . Na Sulfate-K Sulfate-Mg Sulf 17.5-3.13-1.6 GM/180ML SOLN Suprep-Use as directed 354 mL 0  . nitroGLYCERIN (NITROSTAT) 0.4 MG SL tablet Place 1 tablet (0.4 mg total) under the tongue every 5 (five) minutes as needed for chest pain. 20 tablet 0  . ondansetron (ZOFRAN) 4 MG tablet Take 1 tablet (4 mg total) by mouth every 6 (six) hours. 12 tablet 0  . pantoprazole (PROTONIX) 40 MG tablet Take 1 tablet (40 mg total) by mouth 2 (two) times daily. 60 tablet 0  . saccharomyces boulardii (FLORASTOR) 250 MG capsule Take 1 capsule (250 mg total) by mouth 2 (two) times daily. 30 capsule 0   No current facility-administered medications for this visit.     REVIEW OF SYSTEMS:   Constitutional: Denies fevers, chills or abnormal night sweats (+) weight loss  Eyes: Denies blurriness of vision, double vision or watery eyes Ears, nose, mouth, throat, and face: Denies mucositis or sore throat (+) slight ulceration in mouth Respiratory: Denies cough, dyspnea or wheezes Cardiovascular: Denies palpitation, chest discomfort or lower extremity swelling Gastrointestinal:  Denies nausea, heartburn or change in bowel habits Skin: Denies abnormal skin rashes Lymphatics: Denies new lymphadenopathy or easy bruising Neurological:Denies numbness, tingling or new weaknesses Behavioral/Psych: Mood is stable, no new changes  All other systems were reviewed with the patient and are negative.   PHYSICAL EXAMINATION: ECOG PERFORMANCE STATUS: 0 - Asymptomatic  Vitals:   07/30/16 1523  BP: 95/69  Pulse: 81  Resp: 18  Temp: 97.7 F (36.5 C)   Filed  Weights   07/30/16 1523  Weight: 142 lb (64.4 kg)    GENERAL:alert, no distress and comfortable SKIN: skin color, texture, turgor are normal, no rashes or significant lesions EYES: normal, conjunctiva are pink and non-injected, sclera clear OROPHARYNX:no exudate, no erythema and lips, buccal mucosa, and tongue normal  NECK: supple, thyroid normal size, non-tender, without nodularity LYMPH:  no palpable lymphadenopathy in the cervical, axillary or inguinal LUNGS: clear to auscultation and percussion with normal breathing effort HEART: regular rate & rhythm and no murmurs and no lower extremity edema ABDOMEN:abdomen soft, non-tender and normal bowel sounds (+) incision is pretty clean, with a large open wound and pink granulation tissue (+) incision is still open but smaller.  Musculoskeletal:no cyanosis of digits and no clubbing  PSYCH: alert & oriented x 3 with fluent speech NEURO: no focal motor/sensory deficits  LABORATORY DATA:  I have reviewed the data as listed CBC Latest Ref Rng & Units 07/30/2016 07/23/2016 07/02/2016  WBC 4.0 - 10.3 10e3/uL 6.8 9.9 7.5  Hemoglobin 13.0 - 17.1 g/dL 12.5(L) 15.0 11.7(L)  Hematocrit 38.4 - 49.9 % 37.7(L) 42.3 36.1(L)  Platelets 140 - 400 10e3/uL 132(L) 146(L) 159    CMP Latest Ref Rng & Units 07/30/2016 07/23/2016 07/02/2016  Glucose 70 - 140 mg/dl 122 93 91  BUN 7.0 - 26.0 mg/dL 19.6 30(H) 24.9  Creatinine 0.7 - 1.3 mg/dL 1.3 1.30(H) 1.1  Sodium 136 - 145 mEq/L 141 132(L) 142  Potassium 3.5 - 5.1 mEq/L 4.9 5.2(H) 4.7  Chloride 101 - 111 mmol/L - 106 -  CO2 22 - 29 mEq/L  28 15(L) 27  Calcium 8.4 - 10.4 mg/dL 9.9 9.5 10.3  Total Protein 6.4 - 8.3 g/dL 6.4 7.3 7.3  Total Bilirubin 0.20 - 1.20 mg/dL 0.24 0.7 0.47  Alkaline Phos 40 - 150 U/L 51 57 59  AST 5 - 34 U/L 19 25 18   ALT 0 - 55 U/L 18 27 17    PATHOLOGY  Diagnosis 05/18/16 Colon, segmental resection for tumor, Left - INVASIVE COLORECTAL ADENOCARCINOMA, 4.6 CM. - TUMOR EXTENDS INTO  SUBSEROSAL CONNECTIVE TISSUE. - MARGINS NOT INVOLVED. - METASTATIC CARCINOMA IN ONE OF TWELVE LYMPH NODES (1/12). Microscopic Comment COLON AND RECTUM (INCLUDING TRANS-ANAL RESECTION): Specimen: Left colon. Procedure: Segmental resection. Tumor site: Descending colon. Specimen integrity: Intact. Macroscopic intactness of mesorectum: Not applicable. Macroscopic tumor perforation: No Invasive tumor: Maximum size: 4.6 cm Histologic type(s): Colorectal adenocarcinoma Histologic grade and differentiation: G2: moderately differentiated/low grade Type of polyp in which invasive carcinoma arose: No residual polyp Microscopic extension of invasive tumor: Through muscularis propria into subserosal connective tissue. Lymph-Vascular invasion: Present. Peri-neural invasion: No Tumor deposit(s) (discontinuous extramural extension): No Resection margins: Proximal margin: Free of tumor Distal margin: Free of tumor Circumferential (radial) (posterior ascending, posterior descending; lateral and posterior mid-rectum; and entire lower 1/3 rectum): Free of tumor Mesenteric margin (sigmoid and transverse): N/A Distance closest margin (if all above margins negative): 0.5 cm from radial margin Treatment effect (neo-adjuvant therapy): No Microscopic Comment(continued) Additional polyp(s): No Non-neoplastic findings: N/A Lymph nodes: number examined 12; number positive: 1 Pathologic Staging: pT3, pN1a, pMX Ancillary studies: MMR by IHC and MSI by PCR   Diagnosis 05/16/16 Colon, biopsy, Left Descending - ADENOCARCINOMA.  PROCEDURES  ECHO 05/17/16 Study Conclusions - Left ventricle: The cavity size was mildly dilated. Wall   thickness was normal. Systolic function was severely reduced. The   estimated ejection fraction was in the range of 25% to 30%.   Akinesis of the anteroseptal, anterior, and apical myocardium.   Doppler parameters are consistent with abnormal left ventricular   relaxation  (grade 1 diastolic dysfunction).   Colonoscopy 05/16/16 IMPRESSION - Likely malignant completely obstructing tumor in the distal descending colon. Biopsied. - Diverticulosis in the left colon.   RADIOGRAPHIC STUDIES: I have personally reviewed the radiological images as listed and agreed with the findings in the report. No results found.    CT Chest W Contrast 05/17/16 IMPRESSION: Negative for metastatic or acute abnormality in the chest  CT AP W Contrast 05/09/16 IMPRESSION: 1. 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 There is masslike thickening in this region and extensive proximal stool retention. Mesocolic adenopathy, presumed metastatic node. 2. Patient experienced mild contrast reaction as described above. He return to baseline without treatment. Patient is being transferred to ER for evaluation of faintness that was present prior to contrast. 3.  Aortic Atherosclerosis (ICD10-170.0), age advanced 75. Remote left ventricular infarct with dilatation. 5. Subcentimeter hypervascular focus in the upper liver, not typical for metastatic disease. Attention on follow-up   ASSESSMENT & PLAN:  Christopher [name] is a 56 y.o. male who has a history of CAD and CHF, Chronic Kidney Disease, and HLD. He is here for treatment for his colon cancer.   1. Invasive Colorectal Adenocarcinoma of the Left Colon, pT3N1aM0,  Stage IIIB, MSI-stable  -I reviewed his surgical pathology finding and scan findings with patient in great details. -He had a complete surgical resection, 1 positive lymph nodes, locally advanced. Scan showed no evidence of distant metastasis, except a indeterminate tiny liver lesion.   -We discussed the risk of  cancer recurrence, which is high due to his stage III disease. I suggest adjuvant chemotherapy due to the high risk of his cancer, which is the standard of care for stage III cancer.  -I discussed the standard adjuvant chemotherapy regimen FOLFOX or CAPOX. However patient  lives alone, had extensive coronary artery disease and heart failure, is not willing to consider intravenous chemotherapy. -I recommend him to consider single agent capecitabine as adjuvant therapy for 3-6 months. --He can start Xeloda on 6/19, but developed severe diarrhea and weight loss, and stopped after 3 days. -He is very reluctant to restart Xeloda. I recommend him to try lower dose 1042m bid for 3-4 days then 15074mbid, for two weeks on and one week off. I encouraged him to use Imodium as early as possible, and I'll also give him a prescription of Lomotil for diarrhea as needed. After lengthy discussion, he agrees to try, will start in the next few days. -Continue colon cancer surveillance.  2. CAD and systolic CHF with EF 2537-44%Has 2 stents placed and a pace maker -He will follow-up with his cardiologist  3. COPD -recently stopped smoking and was smoking for 40 years 2 packs a day    4. Family Support -He has low family support form siblings. They will check in but they do not help him much -He is put on disability -He feels overall more stressed  5. Financial support -He is on medicaid and disability -He does not get paid until the 27th of this month  -He received financial assistance for his Xeloda, no copay   6. Diarrhea -secondary to Xeloda and caused dehydration  -Will use Lomotil starting with 2 tablets a day then 3 tablets. If he can tolerate well he can go to 4 tablets.   PLAN: -reduce Xeloda to 100056mid for 3 days and then 1500m105md for the next cycle, he will start in 1-2 days  -Order Lomotil today -Lab, f/u and IVF 2 hr at [facility]7/10    No orders of the defined types were placed in this encounter.   All questions were answered. The patient knows to call the clinic with any problems, questions or concerns. I spent 25 minutes counseling the patient face to face. The total time spent in the appointment was 30 minutes and more than 50% was on  counseling.  This document serves as a record of services personally performed by Jasiya Markie [name]. It was created on her behalf by AmoyJoslyn Devontrained medical scribe. The creation of this record is based on the scribe's personal observations and the provider's statements to them. This document has been checked and approved by the attending provider.      FengTruitt Merle 07/30/2016

## 2016-07-30 NOTE — Telephone Encounter (Signed)
Appointments scheduled per 07/30/16 los. Patient was given a copy of the AVS report and appointment schedule per 07/30/16 los. Added to Dr Burr Medico schedule on 08/07/16, per Dr Burr Medico (verbal).

## 2016-08-01 ENCOUNTER — Encounter: Payer: Self-pay | Admitting: Hematology

## 2016-08-06 NOTE — Progress Notes (Signed)
[name]  Telephone:([phone] Fax:([phone]) [phone]  Clinic Follow-up Note   Patient Care Team: [name], MD as PCP - General (Family Medicine) [name], MD as Consulting Physician (Cardiology) [name], MD as Consulting Physician (Cardiology) [name], MD as Consulting Physician (Hematology) [name], MD as Consulting Physician (General Surgery) 08/07/2016  CHIEF COMPLAINTS:  Follow up left side colon cancer, stage IIIB  Oncology History   Cancer Staging Adenocarcinoma of descending colon  pT3, pN1a, pMX s/p colectomy/ostomy 05/18/2016 Staging form: Colon and Rectum, AJCC 8th Edition - Pathologic stage from 05/18/2016: Stage IIIB (pT3, pN1a, cM0) - Signed by [name], MD on 07/01/2016       Adenocarcinoma of descending colon  pT3, pN1a, pMX s/p colectomy/ostomy 05/18/2016   05/09/2016 Imaging    CT chest, abdomen and pelvis showed probable descending colon carcinoma. No evidence of distant metastasis. 7 cm hypervascular focus in the upper liver, not typical for metastatic disease.      05/16/2016 Procedure    Colonoscopy showed a large mass which completely obstructss the distal descending colon. Biopsied. Diverticulosis in the left colon.      05/18/2016 Surgery    Left hemicolectomy by Dr. [name]      05/18/2016 Pathology Results    Left colon segmental resection shows invasive colorectal adenocarcinoma, 4.6 cm, tumor extends into subserosa connective tissue, margins are negative, metastatic carcinoma in 1 of 12 lymph nodes. Grade 2, lymphovascular invasion present, perineural invasion negative.      05/18/2016 Miscellaneous    MSI-stable       05/18/2016 Initial Diagnosis    Adenocarcinoma of descending colon  pT3, pN1a, pMX s/p colectomy/ostomy 05/18/2016      07/16/2016 -  Chemotherapy    Xeloda twice a day for 2 weeks on and 1 week off.          HISTORY OF PRESENTING ILLNESS:  07/02/16 Christopher [name] 56 y.o.  male is here because of recent diagnosis of Invasive Colorectal Adenocarcinoma of the Colon. Initially he was bleeding on and off and had a a lot of gas and constipation. He had a [place] in December and that did not cover his bowel. He also felt a line appearing on his stomach. These symptoms in October. He also had tingling. He went to ED 05/09/16 for syncope. There he had a CT AP scan that showed evidence of a mass in his colon. Followed by a live Colonoscopy and biopsy on 4/18 done by Dr. [name] showing evidence the mass was cancer. He had a colectomy surgery 05/18/16 and the results showed to be Invasive olorectal Adenocarcinoma of the Colon. During his colonoscopy process he could not complete pre treatment  Because the cancer cut it off the path to his bowel and caused him to throw up with blood, so he did a live colonoscopy. PCP is Dr. [name]. He had gotten staph epidermitis in his incision while in the hospital his incision is half healed now.   He has a history of a Psychologist, forensic. He had 2 heart attacks and 2 stents placed and CHF.   He presents to the clinic today post colectomy. He lives by himself and he feels Ok now but he feels like he is not prepared for chemo and the sickness that comes with it. He was told to change his dressing every 3 days and he now changes it everyday. He had painful swallowing after surgery which has resolved and some ulceration in his  mouth seen by his dentist. He is taking ferrous sulfate. He has a lot of stress because he is not sure what was really going on. He is taking baby aspirin. His appetite and energy level is back to normal. He lost weight after surgery. He lost 20 pounds since surgery. He has no feet swelling and he pain on his chest around his pace maker which was put in in August 2015. He gets regularly checked for HIV. He wants to know why he cannot use target therapy. He does not want to do IV Chemotherapy. His regular cardiologist keeps him on his regular heart  medication and after the surgery he got nitro. He is going to see his cardiologist on the 19th.    CURRENT THERAPY: adjuvnat Xeloda 2059m bid, two weeks on and one week off, started on 07/17/2016, stopped after 3 days due to severe diarrhea, and changed to 15051mbid, started on 07/31/2016    INTERVAL HISTORY:  Christopher [name] here for a follow-up. He has been taking Xeloda for a week now. He denied any diarrhea or nausea. His appetite well. He does have some pain below is belly button. He has no other concerns    MEDICAL HISTORY:  Past Medical History:  Diagnosis Date  . Automatic implantable cardioverter-defibrillator in situ    MDT Aug 2015 Dr. TaLovena Le. CAD (coronary artery disease), native coronary artery 07/03/2013   Cath 06/20/13  Normal left main, occluded LAD, occluded RCA, 50% circ EF 15%  3.0 x28 and 3.0 x 8 mm Xience stent Dr. SmTamala JulianTo LAD   . Cancer ([facility]  . Chronic kidney disease   . Chronic systolic CHF (congestive heart failure) (HCC)    ECHO 08/05/13  EF 30%  Anterior akinesis and inferior hypokinesis   . COPD (chronic obstructive pulmonary disease) ([facility]  . Hyperlipidemia   . Mass of colon 04/2016  . Myocardial infarction ([facility]/01[facility]2013  . Thrombocytopenia ([facility]/28/2016   Chronic      SURGICAL HISTORY: Past Surgical History:  Procedure Laterality Date  . CARDIAC CATHETERIZATION  05/2013   . CARDIAC CATHETERIZATION N/A 12/28/2014   Procedure: Left Heart Cath and Coronary Angiography;  Surgeon: ThTroy SineMD;  [facility];  Service: Cardiovascular;  Laterality: N/A;  . CARDIAC CATHETERIZATION N/A 12/28/2014   Procedure: Coronary Stent Intervention;  Surgeon: ThTroy SineMD;  [facility];  Service: Cardiovascular;  Laterality: N/A;  . COLECTOMY WITH COLOSTOMY CREATION/HARTMANN PROCEDURE Left 05/18/2016   Procedure: COLECTOMY WITH OSTOMY CREATION/HARTMANN PROCEDURE;  Surgeon: BuGeorganna SkeansMD;  [facility]  Service: General;  Laterality: Left;  . CORONARY ANGIOPLASTY  05/2013   . FLEXIBLE SIGMOIDOSCOPY N/A 05/16/2016   Procedure: FLEXIBLE SIGMOIDOSCOPY;  Surgeon: HeDoran StablerMD;  [facility] Service: Gastroenterology;  Laterality: N/A;  . ICD placement  09/07/2013   . IMPLANTABLE CARDIOVERTER DEFIBRILLATOR IMPLANT N/A 09/07/2013   Procedure: IMPLANTABLE CARDIOVERTER DEFIBRILLATOR IMPLANT;  Surgeon: GrEvans LanceMD;  [facility];  Service: Cardiovascular;  Laterality: N/A;  . INTRA-AORTIC BALLOON PUMP INSERTION  06/20/2013   Procedure: INTRA-AORTIC BALLOON PUMP INSERTION;  Surgeon: HeSinclair GroomsMD;  [facility];  Service: Cardiovascular;;  . LEFT HEART CATHETERIZATION WITH CORONARY ANGIOGRAM N/A 06/20/2013   Procedure: LEFT HEART CATHETERIZATION WITH CORONARY ANGIOGRAM;  Surgeon: HeSinclair GroomsMD;  [facility];  Service: Cardiovascular;  Laterality:  N/A;  . PERCUTANEOUS CORONARY STENT INTERVENTION (PCI-S)  06/20/2013   Procedure: PERCUTANEOUS CORONARY STENT INTERVENTION (PCI-S);  Surgeon: Sinclair Grooms, MD;  Location: [facility] CATH LAB;  Service: Cardiovascular;;    SOCIAL HISTORY: Social History   Social History  . Marital status: Single    Spouse name: N/A  . Number of children: 0  . Years of education: 12   Occupational History  . Unemployed     Social History Main Topics  . Smoking status: Former Smoker    Packs/day: 2.00    Years: 40.00    Types: Cigarettes    Quit date: 12/22/2014  . Smokeless tobacco: Never Used  . Alcohol use No     Comment: quit in 08/2013, he used to drink alcohol moderate (1 pine liquor one week)  for 30 years   . Drug use: No  . Sexual activity: Not Currently    Birth control/ protection: Condom     Comment: men    Other Topics Concern  . Not on file   Social History Narrative   Lives alone.    In a house trying to sell his home. Exercise: No.    FAMILY HISTORY: Family History  Problem  Relation Age of Onset  . Emphysema Father   . Heart disease Mother   . Dementia Mother   . Diabetes Mother   . Pancreatic cancer Maternal Grandmother   . Lung cancer Maternal Grandmother   . Cancer Neg Hx     ALLERGIES:  is allergic to celebrex [celecoxib] and contrast media [iodinated diagnostic agents].  MEDICATIONS:  Current Outpatient Prescriptions  Medication Sig Dispense Refill  . aspirin 81 MG chewable tablet Chew 1 tablet (81 mg total) by mouth daily. 30 tablet 5  . atorvastatin (LIPITOR) 80 MG tablet Take 1 tablet (80 mg total) by mouth daily at 6 PM. 30 tablet 5  . capecitabine (XELODA) 500 MG tablet Take 4 tablets (2,000 mg total) by mouth 2 (two) times daily after a meal. Take for 2 weeks on, 1 week off (Patient taking differently: Take 1,500 mg by mouth 2 (two) times daily after a meal. Take for 2 weeks on, 1 week off) 112 tablet 2  . carvedilol (COREG) 3.125 MG tablet Take 1 tablet (3.125 mg total) by mouth 2 (two) times daily with a meal. 60 tablet 0  . clopidogrel (PLAVIX) 75 MG tablet Take 75 mg by mouth daily.    . digoxin (LANOXIN) 0.125 MG tablet Take 1 tablet (0.125 mg total) by mouth daily. 30 tablet 5  . diphenoxylate-atropine (LOMOTIL) 2.5-0.025 MG tablet Take 2 tablets by mouth 4 (four) times daily as needed for diarrhea or loose stools. 30 tablet 3  . famotidine (PEPCID) 20 MG tablet Take 1 tablet (20 mg total) by mouth 2 (two) times daily. 30 tablet 0  . feeding supplement, ENSURE ENLIVE, (ENSURE ENLIVE) LIQD Take 237 mLs by mouth 3 (three) times daily between meals. 237 mL 12  . ferrous sulfate 325 (65 FE) MG tablet Take 1 tablet (325 mg total) by mouth 2 (two) times daily with a meal. 60 tablet 3  . lisinopril (PRINIVIL,ZESTRIL) 2.5 MG tablet Take 1 tablet (2.5 mg total) by mouth daily. 30 tablet 5  . loperamide (IMODIUM) 2 MG capsule Take 1 capsule (2 mg total) by mouth 4 (four) times daily as needed for diarrhea or loose stools. 12 capsule 0  . Multiple  Vitamin (MULTIVITAMIN WITH MINERALS) TABS tablet Take 1 tablet by mouth  daily. 30 tablet 0  . Na Sulfate-K Sulfate-Mg Sulf 17.5-3.13-1.6 GM/180ML SOLN Suprep-Use as directed 354 mL 0  . ondansetron (ZOFRAN) 4 MG tablet Take 1 tablet (4 mg total) by mouth every 6 (six) hours. 12 tablet 0  . pantoprazole (PROTONIX) 40 MG tablet Take 1 tablet (40 mg total) by mouth 2 (two) times daily. 60 tablet 0  . saccharomyces boulardii (FLORASTOR) 250 MG capsule Take 1 capsule (250 mg total) by mouth 2 (two) times daily. 30 capsule 0  . nitroGLYCERIN (NITROSTAT) 0.4 MG SL tablet Place 1 tablet (0.4 mg total) under the tongue every 5 (five) minutes as needed for chest pain. (Patient not taking: Reported on 08/07/2016) 20 tablet 0   No current facility-administered medications for this visit.     REVIEW OF SYSTEMS:   Constitutional: Denies fevers, chills or abnormal night sweats  Eyes: Denies blurriness of vision, double vision or watery eyes Ears, nose, mouth, throat, and face: Denies mucositis or sore throat  Respiratory: Denies cough, dyspnea or wheezes Cardiovascular: Denies palpitation, chest discomfort or lower extremity swelling Gastrointestinal:  Denies nausea, heartburn or change in bowel habits Skin: Denies abnormal skin rashes Lymphatics: Denies new lymphadenopathy or easy bruising Neurological:Denies numbness, tingling or new weaknesses Behavioral/Psych: Mood is stable, no new changes  All other systems were reviewed with the patient and are negative.   PHYSICAL EXAMINATION: ECOG PERFORMANCE STATUS: 0 - Asymptomatic  Vitals:   08/07/16 0902  BP: 115/77  Pulse: 73  Resp: 20  Temp: 97.7 F (36.5 C)   Filed Weights   08/07/16 0902  Weight: 144 lb 3.2 oz (65.4 kg)    GENERAL:alert, no distress and comfortable SKIN: skin color, texture, turgor are normal, no rashes or significant lesions EYES: normal, conjunctiva are pink and non-injected, sclera clear OROPHARYNX:no exudate, no  erythema and lips, buccal mucosa, and tongue normal  NECK: supple, thyroid normal size, non-tender, without nodularity LYMPH:  no palpable lymphadenopathy in the cervical, axillary or inguinal LUNGS: clear to auscultation and percussion with normal breathing effort HEART: regular rate & rhythm and no murmurs and no lower extremity edema ABDOMEN:abdomen soft, non-tender and normal bowel sounds (+) incision is pretty clean, with a large open wound and pink granulation tissue (+) incision is still open but smaller.  Musculoskeletal:no cyanosis of digits and no clubbing  PSYCH: alert & oriented x 3 with fluent speech NEURO: no focal motor/sensory deficits  LABORATORY DATA:  I have reviewed the data as listed CBC Latest Ref Rng & Units 08/07/2016 07/30/2016 07/23/2016  WBC 4.0 - 10.3 10e3/uL 7.4 6.8 9.9  Hemoglobin 13.0 - 17.1 g/dL 13.3 12.5(L) 15.0  Hematocrit 38.4 - 49.9 % 39.4 37.7(L) 42.3  Platelets 140 - 400 10e3/uL 146 132(L) 146(L)    CMP Latest Ref Rng & Units 08/07/2016 07/30/2016 07/23/2016  Glucose 70 - 140 mg/dl 116 122 93  BUN 7.0 - 26.0 mg/dL 28.2(H) 19.6 30(H)  Creatinine 0.7 - 1.3 mg/dL 1.3 1.3 1.30(H)  Sodium 136 - 145 mEq/L 137 141 132(L)  Potassium 3.5 - 5.1 mEq/L 5.0 4.9 5.2(H)  Chloride 101 - 111 mmol/L - - 106  CO2 22 - 29 mEq/L 25 28 15(L)  Calcium 8.4 - 10.4 mg/dL 9.4 9.9 9.5  Total Protein 6.4 - 8.3 g/dL 7.2 6.4 7.3  Total Bilirubin 0.20 - 1.20 mg/dL 0.29 0.24 0.7  Alkaline Phos 40 - 150 U/L 54 51 57  AST 5 - 34 U/L 23 19 25   ALT 0 - 55 U/L 24  18 27   PATHOLOGY  Diagnosis 05/18/16 Colon, segmental resection for tumor, Left - INVASIVE COLORECTAL ADENOCARCINOMA, 4.6 CM. - TUMOR EXTENDS INTO SUBSEROSAL CONNECTIVE TISSUE. - MARGINS NOT INVOLVED. - METASTATIC CARCINOMA IN ONE OF TWELVE LYMPH NODES (1/12). Microscopic Comment COLON AND RECTUM (INCLUDING TRANS-ANAL RESECTION): Specimen: Left colon. Procedure: Segmental resection. Tumor site: Descending  colon. Specimen integrity: Intact. Macroscopic intactness of mesorectum: Not applicable. Macroscopic tumor perforation: No Invasive tumor: Maximum size: 4.6 cm Histologic type(s): Colorectal adenocarcinoma Histologic grade and differentiation: G2: moderately differentiated/low grade Type of polyp in which invasive carcinoma arose: No residual polyp Microscopic extension of invasive tumor: Through muscularis propria into subserosal connective tissue. Lymph-Vascular invasion: Present. Peri-neural invasion: No Tumor deposit(s) (discontinuous extramural extension): No Resection margins: Proximal margin: Free of tumor Distal margin: Free of tumor Circumferential (radial) (posterior ascending, posterior descending; lateral and posterior mid-rectum; and entire lower 1/3 rectum): Free of tumor Mesenteric margin (sigmoid and transverse): N/A Distance closest margin (if all above margins negative): 0.5 cm from radial margin Treatment effect (neo-adjuvant therapy): No Microscopic Comment(continued) Additional polyp(s): No Non-neoplastic findings: N/A Lymph nodes: number examined 12; number positive: 1 Pathologic Staging: pT3, pN1a, pMX Ancillary studies: MMR by IHC and MSI by PCR   Diagnosis 05/16/16 Colon, biopsy, Left Descending - ADENOCARCINOMA.  PROCEDURES  ECHO 05/17/16 Study Conclusions - Left ventricle: The cavity size was mildly dilated. Wall   thickness was normal. Systolic function was severely reduced. The   estimated ejection fraction was in the range of 25% to 30%.   Akinesis of the anteroseptal, anterior, and apical myocardium.   Doppler parameters are consistent with abnormal left ventricular   relaxation (grade 1 diastolic dysfunction).   Colonoscopy 05/16/16 IMPRESSION - Likely malignant completely obstructing tumor in the distal descending colon. Biopsied. - Diverticulosis in the left colon.   RADIOGRAPHIC STUDIES: I have personally reviewed the radiological  images as listed and agreed with the findings in the report. No results found.    CT Chest W Contrast 05/17/16 IMPRESSION: Negative for metastatic or acute abnormality in the chest  CT AP W Contrast 05/09/16 IMPRESSION: 1. 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 There is masslike thickening in this region and extensive proximal stool retention. Mesocolic adenopathy, presumed metastatic node. 2. Patient experienced mild contrast reaction as described above. He return to baseline without treatment. Patient is being transferred to ER for evaluation of faintness that was present prior to contrast. 3.  Aortic Atherosclerosis (ICD10-170.0), age advanced 35. Remote left ventricular infarct with dilatation. 5. Subcentimeter hypervascular focus in the upper liver, not typical for metastatic disease. Attention on follow-up   ASSESSMENT & PLAN:  Christopher [name] is a 55 y.o. male who has a history of CAD and CHF, Chronic Kidney Disease, and HLD. He is here for treatment for his colon cancer.   1. Invasive Colorectal Adenocarcinoma of the Left Colon, pT3N1aM0,  Stage IIIB, MSI-stable  -I reviewed his surgical pathology finding and scan findings with patient in great details. -He had a complete surgical resection, 1 positive lymph nodes, locally advanced. Scan showed no evidence of distant metastasis, except a indeterminate tiny liver lesion.   -We discussed the risk of cancer recurrence, which is high due to his stage III disease. I suggest adjuvant chemotherapy due to the high risk of his cancer, which is the standard of care for stage III cancer.  -I previously discussed the standard adjuvant chemotherapy regimen FOLFOX or CAPOX. However patient lives alone, had extensive coronary artery disease and heart failure, is  not willing to consider intravenous chemotherapy. -I previously recommend him to consider single agent capecitabine as adjuvant therapy for 3-6 months. --He can start Xeloda on 6/19, but  developed severe diarrhea and weight loss, and stopped after 3 days. -He was previouslyvery reluctant to restart Xeloda. I previously recommend him to try lower dose 1038m bid for 3-4 days then 15070mbid, for two weeks on and one week off. I encouraged him to use Imodium as early as possible, and I'll also give him a prescription of Lomotil for diarrhea as needed. After lengthy discussion, he has started and is tolerating well.  If he can do 6 pills a day, we can continue same dosage -Continue colon cancer surveillance.  2. CAD and systolic CHF with EF 2516-60%Has 2 stents placed and a pace maker -He will follow-up with his cardiologist  3. COPD -recently stopped smoking and was smoking for 40 years 2 packs a day    4. Family Support -He has low family support form siblings. They will check in but they do not help him much -He is put on disability -He feels overall more stressed  5. Financial support -He is on medicaid and disability -He does not get paid until the 27th of this month  -He received financial assistance for his Xeloda, no copay   6. Diarrhea -secondary to Xeloda and caused dehydration  -Will use Lomotil starting with 2 tablets a day then 3 tablets. If he can tolerate well he can go to 4 tablets.  -no more diarrhea with lower dose Xeloda   PLAN: -continue Xeloda at 150029mid, 2 weeks on and one week off -he will return in 2 weeks for f/u and labs on 7/17, before next cycle    No orders of the defined types were placed in this encounter.   All questions were answered. The patient knows to call the clinic with any problems, questions or concerns. I spent 15 minutes counseling the patient face to face. The total time spent in the appointment was 20 minutes and more than 50% was on counseling.  This document serves as a record of services personally performed by YanTruitt MerleD. It was created on her behalf by TalBrandt Loosen trained medical scribe. The creation of this  record is based on the scribe's personal observations and the provider's statements to them. This document has been checked and approved by the attending provider.      FenTruitt MerleD 08/07/2016

## 2016-08-07 ENCOUNTER — Encounter: Payer: Self-pay | Admitting: Hematology

## 2016-08-07 ENCOUNTER — Ambulatory Visit (HOSPITAL_BASED_OUTPATIENT_CLINIC_OR_DEPARTMENT_OTHER): Payer: Medicare Other | Admitting: Hematology

## 2016-08-07 ENCOUNTER — Other Ambulatory Visit (HOSPITAL_BASED_OUTPATIENT_CLINIC_OR_DEPARTMENT_OTHER): Payer: Medicare Other

## 2016-08-07 ENCOUNTER — Ambulatory Visit: Payer: Medicare Other

## 2016-08-07 ENCOUNTER — Telehealth: Payer: Self-pay | Admitting: Hematology

## 2016-08-07 VITALS — BP 115/77 | HR 73 | Temp 97.7°F | Resp 20 | Ht 68.0 in | Wt 144.2 lb

## 2016-08-07 DIAGNOSIS — C186 Malignant neoplasm of descending colon: Secondary | ICD-10-CM

## 2016-08-07 DIAGNOSIS — D500 Iron deficiency anemia secondary to blood loss (chronic): Secondary | ICD-10-CM | POA: Diagnosis not present

## 2016-08-07 DIAGNOSIS — C189 Malignant neoplasm of colon, unspecified: Secondary | ICD-10-CM

## 2016-08-07 LAB — COMPREHENSIVE METABOLIC PANEL
ALT: 24 U/L (ref 0–55)
AST: 23 U/L (ref 5–34)
Albumin: 3.9 g/dL (ref 3.5–5.0)
Alkaline Phosphatase: 54 U/L (ref 40–150)
Anion Gap: 8 mEq/L (ref 3–11)
BILIRUBIN TOTAL: 0.29 mg/dL (ref 0.20–1.20)
BUN: 28.2 mg/dL — ABNORMAL HIGH (ref 7.0–26.0)
CHLORIDE: 104 meq/L (ref 98–109)
CO2: 25 meq/L (ref 22–29)
CREATININE: 1.3 mg/dL (ref 0.7–1.3)
Calcium: 9.4 mg/dL (ref 8.4–10.4)
EGFR: 64 mL/min/{1.73_m2} — AB (ref >90)
GLUCOSE: 116 mg/dL (ref 70–140)
Potassium: 5 mEq/L (ref 3.5–5.1)
SODIUM: 137 meq/L (ref 136–145)
TOTAL PROTEIN: 7.2 g/dL (ref 6.4–8.3)

## 2016-08-07 LAB — CBC WITH DIFFERENTIAL/PLATELET
BASO%: 0.5 % (ref 0.0–2.0)
Basophils Absolute: 0 10*3/uL (ref 0.0–0.1)
EOS%: 3.6 % (ref 0.0–7.0)
Eosinophils Absolute: 0.3 10*3/uL (ref 0.0–0.5)
HCT: 39.4 % (ref 38.4–49.9)
HGB: 13.3 g/dL (ref 13.0–17.1)
LYMPH%: 32.8 % (ref 14.0–49.0)
MCH: 30.1 pg (ref 27.2–33.4)
MCHC: 33.7 g/dL (ref 32.0–36.0)
MCV: 89.3 fL (ref 79.3–98.0)
MONO#: 0.6 10*3/uL (ref 0.1–0.9)
MONO%: 8.5 % (ref 0.0–14.0)
NEUT#: 4 10*3/uL (ref 1.5–6.5)
NEUT%: 54.6 % (ref 39.0–75.0)
Platelets: 146 10*3/uL (ref 140–400)
RBC: 4.41 10*6/uL (ref 4.20–5.82)
RDW: 20 % — ABNORMAL HIGH (ref 11.0–14.6)
WBC: 7.4 10*3/uL (ref 4.0–10.3)
lymph#: 2.4 10*3/uL (ref 0.9–3.3)

## 2016-08-07 LAB — FERRITIN: Ferritin: 29 ng/ml (ref 22–316)

## 2016-08-07 NOTE — Telephone Encounter (Signed)
Gave patient avs report and appointments for July. [place] for 7/10 incomplete asking for lab/fu with no time span. Per patient as per his conversation with YF he is to returned 7/17 @ 8:15 am to see YF and 7:45 am for lab. Scheduled patient according to this message. Not able to reach YF or desk nurse by phone. Left message for desk nurse to confirm lab/fu date/time.

## 2016-08-10 NOTE — Telephone Encounter (Signed)
Per confirmation from desk nurse patient to return for lab/fu 7/23. Also schedule message sent to confirm request 7/10. Cancelled 7/17 appointments and added lab/fu 7/23. Left message for patient re changes with new date/time. Patient was aware I would call with changes should YF confirm anything different that one week f/u.

## 2016-08-14 ENCOUNTER — Other Ambulatory Visit: Payer: Self-pay

## 2016-08-14 ENCOUNTER — Telehealth: Payer: Self-pay | Admitting: *Deleted

## 2016-08-14 ENCOUNTER — Ambulatory Visit: Payer: Self-pay | Admitting: Hematology

## 2016-08-14 NOTE — Telephone Encounter (Signed)
Pt called and left message requesting to change appts on 08/20/16.   Spoke with pt and was informed that pt has jury duty on Mon 08/20/16.    Informed pt that appts will be changed to [facility]  08/22/16.  Pt understood to call office back if he gets picked for jury. Pt stated he was taking Xeloda 1500 mg BID since last office visit on 7/10 as instructed by md.  However, starting  08/12/16, pt had decreased dose to  1000 mg  BID on his own due to diarrhea issue.  Stated he takes Imodium alternate with Lomotil with relief.  Stated he has been drinking lots of fluids as tolerated.  Last dose of Xeloda will be today  08/14/16.  Pt will be on his off week  From  7/18 -  08/21/16.

## 2016-08-16 ENCOUNTER — Ambulatory Visit: Payer: Self-pay | Admitting: Family Medicine

## 2016-08-20 ENCOUNTER — Other Ambulatory Visit: Payer: Self-pay

## 2016-08-20 ENCOUNTER — Ambulatory Visit: Payer: Self-pay | Admitting: Hematology

## 2016-08-22 ENCOUNTER — Ambulatory Visit (HOSPITAL_BASED_OUTPATIENT_CLINIC_OR_DEPARTMENT_OTHER): Payer: Medicare Other | Admitting: Hematology

## 2016-08-22 ENCOUNTER — Ambulatory Visit (HOSPITAL_COMMUNITY)
Admission: RE | Admit: 2016-08-22 | Discharge: 2016-08-22 | Disposition: A | Discharge status: Home / Self Care | Payer: Medicare Other | Source: Ambulatory Visit | Attending: Family Medicine | Admitting: Family Medicine

## 2016-08-22 ENCOUNTER — Telehealth: Payer: Self-pay | Admitting: Hematology

## 2016-08-22 ENCOUNTER — Other Ambulatory Visit (HOSPITAL_BASED_OUTPATIENT_CLINIC_OR_DEPARTMENT_OTHER): Payer: Medicare Other

## 2016-08-22 VITALS — BP 93/62 | HR 92 | Temp 97.7°F | Resp 17 | Ht 68.0 in | Wt 138.8 lb

## 2016-08-22 DIAGNOSIS — C189 Malignant neoplasm of colon, unspecified: Secondary | ICD-10-CM

## 2016-08-22 DIAGNOSIS — R197 Diarrhea, unspecified: Secondary | ICD-10-CM

## 2016-08-22 DIAGNOSIS — I255 Ischemic cardiomyopathy: Secondary | ICD-10-CM | POA: Diagnosis not present

## 2016-08-22 DIAGNOSIS — C186 Malignant neoplasm of descending colon: Secondary | ICD-10-CM | POA: Insufficient documentation

## 2016-08-22 DIAGNOSIS — J449 Chronic obstructive pulmonary disease, unspecified: Secondary | ICD-10-CM

## 2016-08-22 DIAGNOSIS — N179 Acute kidney failure, unspecified: Secondary | ICD-10-CM | POA: Diagnosis not present

## 2016-08-22 DIAGNOSIS — F17211 Nicotine dependence, cigarettes, in remission: Secondary | ICD-10-CM

## 2016-08-22 LAB — CBC WITH DIFFERENTIAL/PLATELET
BASO%: 0.7 % (ref 0.0–2.0)
BASOS ABS: 0 10*3/uL (ref 0.0–0.1)
EOS ABS: 0.3 10*3/uL (ref 0.0–0.5)
EOS%: 4.5 % (ref 0.0–7.0)
HCT: 39.6 % (ref 38.4–49.9)
HEMOGLOBIN: 13.2 g/dL (ref 13.0–17.1)
LYMPH%: 30.7 % (ref 14.0–49.0)
MCH: 30.4 pg (ref 27.2–33.4)
MCHC: 33.3 g/dL (ref 32.0–36.0)
MCV: 91.1 fL (ref 79.3–98.0)
MONO#: 0.8 10*3/uL (ref 0.1–0.9)
MONO%: 12.4 % (ref 0.0–14.0)
NEUT#: 3.3 10*3/uL (ref 1.5–6.5)
NEUT%: 51.7 % (ref 39.0–75.0)
PLATELETS: 129 10*3/uL — AB (ref 140–400)
RBC: 4.35 10*6/uL (ref 4.20–5.82)
RDW: 20.3 % — ABNORMAL HIGH (ref 11.0–14.6)
WBC: 6.3 10*3/uL (ref 4.0–10.3)
lymph#: 1.9 10*3/uL (ref 0.9–3.3)

## 2016-08-22 LAB — COMPREHENSIVE METABOLIC PANEL
ALBUMIN: 3.6 g/dL (ref 3.5–5.0)
ALK PHOS: 58 U/L (ref 40–150)
ALT: 18 U/L (ref 0–55)
ANION GAP: 11 meq/L (ref 3–11)
AST: 29 U/L (ref 5–34)
BILIRUBIN TOTAL: 0.36 mg/dL (ref 0.20–1.20)
BUN: 28.2 mg/dL — ABNORMAL HIGH (ref 7.0–26.0)
CO2: 19 mEq/L — ABNORMAL LOW (ref 22–29)
Calcium: 9.3 mg/dL (ref 8.4–10.4)
Chloride: 105 mEq/L (ref 98–109)
Creatinine: 1.9 mg/dL — ABNORMAL HIGH (ref 0.7–1.3)
EGFR: 39 mL/min/{1.73_m2} — AB (ref >90)
Glucose: 95 mg/dl (ref 70–140)
Potassium: 4 mEq/L (ref 3.5–5.1)
Sodium: 135 mEq/L — ABNORMAL LOW (ref 136–145)
TOTAL PROTEIN: 6.4 g/dL (ref 6.4–8.3)

## 2016-08-22 MED ORDER — CAPECITABINE 500 MG PO TABS: ORAL_TABLET | ORAL | 2 refills | Status: DC

## 2016-08-22 MED ORDER — SODIUM CHLORIDE 0.9 % IV SOLN
Freq: Once | INTRAVENOUS | Status: AC
  Administered 2016-08-22: 13:00:00 via INTRAVENOUS

## 2016-08-22 NOTE — Progress Notes (Signed)
Ordering Provider: Dr. Burr Medico   Associated Diagnosis: Adenocarcinoma of descending colon pT3, pN1a, pMX s/p colectomy/ostomy 05/18/2016  Procedure Note: IV infusion of normal saline over 2 hours  Condition During Procedure: Pt tolerated well; no complications noted  Condition at Discharge: Pt discharged home; pt alert, oriented, ambulatory

## 2016-08-22 NOTE — Discharge Instructions (Signed)

## 2016-08-22 NOTE — Telephone Encounter (Signed)
Lvm advising appt 7/27 + asked him to collect a calendar then.

## 2016-08-22 NOTE — Progress Notes (Signed)
[place]  [phone]) [phone] [phone]) [phone]  Clinic Follow-up Note   Patient Care Team: [name], MD as PCP - General (Family Medicine) [name], MD as Consulting Physician (Cardiology) [name], MD as Consulting Physician (Cardiology) [name], MD as Consulting Physician (Hematology) [name], MD as Consulting Physician (General Surgery) 08/22/2016  CHIEF COMPLAINTS:  Follow up left side colon cancer, stage IIIB  Oncology History   Cancer Staging Adenocarcinoma of descending colon  pT3, pN1a, pMX s/p colectomy/ostomy 05/18/2016 Staging form: Colon and Rectum, AJCC 8th Edition - Pathologic stage from 05/18/2016: Stage IIIB (pT3, pN1a, cM0) - Signed by [name], MD on 07/01/2016       Adenocarcinoma of descending colon  pT3, pN1a, pMX s/p colectomy/ostomy 05/18/2016   05/09/2016 Imaging    CT chest, abdomen and pelvis showed probable descending colon carcinoma. No evidence of distant metastasis. 7 cm hypervascular focus in the upper liver, not typical for metastatic disease.      05/16/2016 Procedure    Colonoscopy showed a large mass which completely obstructss the distal descending colon. Biopsied. Diverticulosis in the left colon.      05/18/2016 Surgery    Left hemicolectomy by Dr. [name]      05/18/2016 Pathology Results    Left colon segmental resection shows invasive colorectal adenocarcinoma, 4.6 cm, tumor extends into subserosa connective tissue, margins are negative, metastatic carcinoma in 1 of 12 lymph nodes. Grade 2, lymphovascular invasion present, perineural invasion negative.      05/18/2016 Miscellaneous    MSI-stable       05/18/2016 Initial Diagnosis    Adenocarcinoma of descending colon  pT3, pN1a, pMX s/p colectomy/ostomy 05/18/2016      07/16/2016 -  Chemotherapy    Xeloda twice a day for 2 weeks on and 1 week off.         HISTORY OF PRESENTING ILLNESS:  07/02/16 [name] 56 y.o. male  is here because of recent diagnosis of Invasive Colorectal Adenocarcinoma of the Colon. Initially he was bleeding on and off and had a a lot of gas and constipation. He had a [place] in December and that did not cover his bowel. He also felt a line appearing on his stomach. These symptoms in October. He also had tingling. He went to ED 05/09/16 for syncope. There he had a CT AP scan that showed evidence of a mass in his colon. Followed by a live Colonoscopy and biopsy on 4/18 done by Dr. [name] showing evidence the mass was cancer. He had a colectomy surgery 05/18/16 and the results showed to be Invasive olorectal Adenocarcinoma of the Colon. During his colonoscopy process he could not complete pre treatment  Because the cancer cut it off the path to his bowel and caused him to throw up with blood, so he did a live colonoscopy. PCP is Dr. [name]. He had gotten staph epidermitis in his incision while in the hospital his incision is half healed now.   He has a history of a Psychologist, forensic. He had 2 heart attacks and 2 stents placed and CHF.   He presents to the clinic today post colectomy. He lives by himself and he feels Ok now but he feels like he is not prepared for chemo and the sickness that comes with it. He was told to change his dressing every 3 days and he now changes it everyday. He had painful swallowing after surgery which has resolved and some ulceration in his mouth  seen by his dentist. He is taking ferrous sulfate. He has a lot of stress because he is not sure what was really going on. He is taking baby aspirin. His appetite and energy level is back to normal. He lost weight after surgery. He lost 20 pounds since surgery. He has no feet swelling and he pain on his chest around his pace maker which was put in in August 2015. He gets regularly checked for HIV. He wants to know why he cannot use target therapy. He does not want to do IV Chemotherapy. His regular cardiologist keeps him on his regular heart  medication and after the surgery he got nitro. He is going to see his cardiologist on the 19th.    CURRENT THERAPY: adjuvnat Xeloda 2048m bid, two weeks on and one week off, started on 07/17/2016, stopped after 3 days due to severe diarrhea, and changed to 15028mbid, started on 07/31/2016; Hold Xeloda starting 08/22/16 due to AKI. Will lower Xeloda to 3 in the morning and 2 in the evening when restart next cycle.     INTERVAL HISTORY:  HaBUREL KAHREs here for a follow-up. He reports he felt churning and turning and he had a lot of fluid in his bag. This stopped after he ate something.  He does get acid reflux. He went to his surgeon and he said it is healing around his wound. Pain increases when he pushes it.  When he was taking 4 Xeloda it was fine but with 6 pills he gets diarrhea. It will be one episode once a day. He takes 2 and 2 imodium. He has nausea slightly but no fever. I felt better when he ate.  His urination is relatively the same.      MEDICAL HISTORY:  Past Medical History:  Diagnosis Date  . Automatic implantable cardioverter-defibrillator in situ    MDT Aug 2015 Dr. TaLovena Le. CAD (coronary artery disease), native coronary artery 07/03/2013   Cath 06/20/13  Normal left main, occluded LAD, occluded RCA, 50% circ EF 15%  3.0 x28 and 3.0 x 8 mm Xience stent Dr. SmTamala JulianTo LAD   . Cancer ([facility]  . Chronic kidney disease   . Chronic systolic CHF (congestive heart failure) (HCC)    ECHO 08/05/13  EF 30%  Anterior akinesis and inferior hypokinesis   . COPD (chronic obstructive pulmonary disease) ([facility]  . Hyperlipidemia   . Mass of colon 04/2016  . Myocardial infarction ([facility]04/01/2013  . Thrombocytopenia ([facility]11/28/2016   Chronic      SURGICAL HISTORY: Past Surgical History:  Procedure Laterality Date  . CARDIAC CATHETERIZATION  05/2013   . CARDIAC CATHETERIZATION N/A 12/28/2014   Procedure: Left Heart Cath and Coronary Angiography;  Surgeon: ThTroy SineMD;   [facility];  Service: Cardiovascular;  Laterality: N/A;  . CARDIAC CATHETERIZATION N/A 12/28/2014   Procedure: Coronary Stent Intervention;  Surgeon: ThTroy SineMD;  [facility];  Service: Cardiovascular;  Laterality: N/A;  . COLECTOMY WITH COLOSTOMY CREATION/HARTMANN PROCEDURE Left 05/18/2016   Procedure: COLECTOMY WITH OSTOMY CREATION/HARTMANN PROCEDURE;  Surgeon: BuGeorganna SkeansMD;  Location: [facility] Service: General;  Laterality: Left;  . CORONARY ANGIOPLASTY  05/2013   . FLEXIBLE SIGMOIDOSCOPY N/A 05/16/2016   Procedure: FLEXIBLE SIGMOIDOSCOPY;  Surgeon: HeDoran StablerMD;  Location: [facility] Service: Gastroenterology;  Laterality: N/A;  . ICD placement  09/07/2013   . IMPLANTABLE CARDIOVERTER  DEFIBRILLATOR IMPLANT N/A 09/07/2013   Procedure: IMPLANTABLE CARDIOVERTER DEFIBRILLATOR IMPLANT;  Surgeon: [name], MD;  Location: [facility] CATH LAB;  Service: Cardiovascular;  Laterality: N/A;  . INTRA-AORTIC BALLOON PUMP INSERTION  06/20/2013   Procedure: INTRA-AORTIC BALLOON PUMP INSERTION;  Surgeon: Sinclair Grooms, MD;  Location: [facility] CATH LAB;  Service: Cardiovascular;;  . LEFT HEART CATHETERIZATION WITH CORONARY ANGIOGRAM N/A 06/20/2013   Procedure: LEFT HEART CATHETERIZATION WITH CORONARY ANGIOGRAM;  Surgeon: Sinclair Grooms, MD;  Location[facility] CATH LAB;  Service: Cardiovascular;  Laterality: N/A;  . PERCUTANEOUS CORONARY STENT INTERVENTION (PCI-S)  06/20/2013   Procedure: PERCUTANEOUS CORONARY STENT INTERVENTION (PCI-S);  Surgeon: Sinclair Grooms, MD;  Location: [facility] CATH LAB;  Service: Cardiovascular;;    SOCIAL HISTORY: Social History   Social History  . Marital status: Single    Spouse name: N/A  . Number of children: 0  . Years of education: 12   Occupational History  . Unemployed     Social History Main Topics  . Smoking status: Former Smoker    Packs/day: 2.00    Years: 40.00    Types: Cigarettes    Quit date: 12/22/2014  .  Smokeless tobacco: Never Used  . Alcohol use No     Comment: quit in 08/2013, he used to drink alcohol moderate (1 pine liquor one week)  for 30 years   . Drug use: No  . Sexual activity: Not Currently    Birth control/ protection: Condom     Comment: men    Other Topics Concern  . Not on file   Social History Narrative   Lives alone.    In a house trying to sell his home. Exercise: No.    FAMILY HISTORY: Family History  Problem Relation Age of Onset  . Emphysema Father   . Heart disease Mother   . Dementia Mother   . Diabetes Mother   . Pancreatic cancer Maternal Grandmother   . Lung cancer Maternal Grandmother   . Cancer Neg Hx     ALLERGIES:  is allergic to celebrex [celecoxib] and contrast media [iodinated diagnostic agents].  MEDICATIONS:  Current Outpatient Prescriptions  Medication Sig Dispense Refill  . aspirin 81 MG chewable tablet Chew 1 tablet (81 mg total) by mouth daily. 30 tablet 5  . atorvastatin (LIPITOR) 80 MG tablet Take 1 tablet (80 mg total) by mouth daily at 6 PM. 30 tablet 5  . carvedilol (COREG) 3.125 MG tablet Take 1 tablet (3.125 mg total) by mouth 2 (two) times daily with a meal. 60 tablet 0  . clopidogrel (PLAVIX) 75 MG tablet Take 75 mg by mouth daily.    . digoxin (LANOXIN) 0.125 MG tablet Take 1 tablet (0.125 mg total) by mouth daily. 30 tablet 5  . diphenoxylate-atropine (LOMOTIL) 2.5-0.025 MG tablet Take 2 tablets by mouth 4 (four) times daily as needed for diarrhea or loose stools. 30 tablet 3  . famotidine (PEPCID) 20 MG tablet Take 1 tablet (20 mg total) by mouth 2 (two) times daily. 30 tablet 0  . feeding supplement, ENSURE ENLIVE, (ENSURE ENLIVE) LIQD Take 237 mLs by mouth 3 (three) times daily between meals. 237 mL 12  . ferrous sulfate 325 (65 FE) MG tablet Take 1 tablet (325 mg total) by mouth 2 (two) times daily with a meal. 60 tablet 3  . lisinopril (PRINIVIL,ZESTRIL) 2.5 MG tablet Take 1 tablet (2.5 mg total) by mouth daily. 30  tablet 5  . loperamide (IMODIUM) 2  MG capsule Take 1 capsule (2 mg total) by mouth 4 (four) times daily as needed for diarrhea or loose stools. 12 capsule 0  . Multiple Vitamin (MULTIVITAMIN WITH MINERALS) TABS tablet Take 1 tablet by mouth daily. 30 tablet 0  . Na Sulfate-K Sulfate-Mg Sulf 17.5-3.13-1.6 GM/180ML SOLN Suprep-Use as directed 354 mL 0  . ondansetron (ZOFRAN) 4 MG tablet Take 1 tablet (4 mg total) by mouth every 6 (six) hours. 12 tablet 0  . saccharomyces boulardii (FLORASTOR) 250 MG capsule Take 1 capsule (250 mg total) by mouth 2 (two) times daily. 30 capsule 0  . capecitabine (XELODA) 500 MG tablet 3 tab in the morning and 2 tab in the evening. Take for 2 weeks on, 1 week off 84 tablet 2  . nitroGLYCERIN (NITROSTAT) 0.4 MG SL tablet Place 1 tablet (0.4 mg total) under the tongue every 5 (five) minutes as needed for chest pain. (Patient not taking: Reported on 08/07/2016) 20 tablet 0   No current facility-administered medications for this visit.     REVIEW OF SYSTEMS:   Constitutional: Denies fevers, chills or abnormal night sweats  Eyes: Denies blurriness of vision, double vision or watery eyes Ears, nose, mouth, throat, and face: Denies mucositis or sore throat  Respiratory: Denies cough, dyspnea or wheezes Cardiovascular: Denies palpitation, chest discomfort or lower extremity swelling Gastrointestinal:  Denies nausea, heartburn  (+) Sore and tight around incision of lower midline. (+) acid reflux (+) diarrhea (+) nausea  Skin: Denies abnormal skin rashes Lymphatics: Denies new lymphadenopathy or easy bruising Neurological:Denies numbness, tingling or new weaknesses Behavioral/Psych: Mood is stable, no new changes  All other systems were reviewed with the patient and are negative.   PHYSICAL EXAMINATION: ECOG PERFORMANCE STATUS: 0 - Asymptomatic  Vitals:   08/22/16 1211 08/22/16 1218  BP: 93/62   Pulse: (!) 37 92  Resp: 17   Temp: 97.7 F (36.5 C)    Filed  Weights   08/22/16 1211  Weight: 138 lb 12.8 oz (63 kg)    GENERAL:alert, no distress and comfortable SKIN: skin color, texture, turgor are normal, no rashes or significant lesions EYES: normal, conjunctiva are pink and non-injected, sclera clear OROPHARYNX:no exudate, no erythema and lips, buccal mucosa, and tongue normal  NECK: supple, thyroid normal size, non-tender, without nodularity LYMPH:  no palpable lymphadenopathy in the cervical, axillary or inguinal LUNGS: clear to auscultation and percussion with normal breathing effort HEART: regular rate & rhythm and no murmurs and no lower extremity edema ABDOMEN:abdomen soft, non-tender and normal bowel sounds (+) incision is pretty clean, with a large open wound and pink granulation tissue (+) incision is still open but smaller.  Musculoskeletal:no cyanosis of digits and no clubbing  PSYCH: alert & oriented x 3 with fluent speech NEURO: no focal motor/sensory deficits  LABORATORY DATA:  I have reviewed the data as listed CBC Latest Ref Rng & Units 08/22/2016 08/07/2016 07/30/2016  WBC 4.0 - 10.3 10e3/uL 6.3 7.4 6.8  Hemoglobin 13.0 - 17.1 g/dL 13.2 13.3 12.5(L)  Hematocrit 38.4 - 49.9 % 39.6 39.4 37.7(L)  Platelets 140 - 400 10e3/uL 129(L) 146 132(L)    CMP Latest Ref Rng & Units 08/22/2016 08/07/2016 07/30/2016  Glucose 70 - 140 mg/dl 95 116 122  BUN 7.0 - 26.0 mg/dL 28.2(H) 28.2(H) 19.6  Creatinine 0.7 - 1.3 mg/dL 1.9(H) 1.3 1.3  Sodium 136 - 145 mEq/L 135(L) 137 141  Potassium 3.5 - 5.1 mEq/L 4.0 5.0 4.9  Chloride 101 - 111 mmol/L - - -  CO2 22 - 29 mEq/L 19(L) 25 28  Calcium 8.4 - 10.4 mg/dL 9.3 9.4 9.9  Total Protein 6.4 - 8.3 g/dL 6.4 7.2 6.4  Total Bilirubin 0.20 - 1.20 mg/dL 0.36 0.29 0.24  Alkaline Phos 40 - 150 U/L 58 54 51  AST 5 - 34 U/L _0 ALT 0 - 55 U/L _1 PATHOLOGY  Diagnosis 05/18/16 Colon, segmental resection for tumor, Left - INVASIVE COLORECTAL ADENOCARCINOMA, 4.6 CM. - TUMOR EXTENDS INTO  SUBSEROSAL CONNECTIVE TISSUE. - MARGINS NOT INVOLVED. - METASTATIC CARCINOMA IN ONE OF TWELVE LYMPH NODES (1/12). Microscopic Comment COLON AND RECTUM (INCLUDING TRANS-ANAL RESECTION): Specimen: Left colon. Procedure: Segmental resection. Tumor site: Descending colon. Specimen integrity: Intact. Macroscopic intactness of mesorectum: Not applicable. Macroscopic tumor perforation: No Invasive tumor: Maximum size: 4.6 cm Histologic type(s): Colorectal adenocarcinoma Histologic grade and differentiation: G2: moderately differentiated/low grade Type of polyp in which invasive carcinoma arose: No residual polyp Microscopic extension of invasive tumor: Through muscularis propria into subserosal connective tissue. Lymph-Vascular invasion: Present. Peri-neural invasion: No Tumor deposit(s) (discontinuous extramural extension): No Resection margins: Proximal margin: Free of tumor Distal margin: Free of tumor Circumferential (radial) (posterior ascending, posterior descending; lateral and posterior mid-rectum; and entire lower 1/3 rectum): Free of tumor Mesenteric margin (sigmoid and transverse): N/A Distance closest margin (if all above margins negative): 0.5 cm from radial margin Treatment effect (neo-adjuvant therapy): No Microscopic Comment(continued) Additional polyp(s): No Non-neoplastic findings: N/A Lymph nodes: number examined 12; number positive: 1 Pathologic Staging: pT3, pN1a, pMX Ancillary studies: MMR by IHC and MSI by PCR   Diagnosis 05/16/16 Colon, biopsy, Left Descending - ADENOCARCINOMA.  PROCEDURES  ECHO 05/17/16 Study Conclusions - Left ventricle: The cavity size was mildly dilated. Wall   thickness was normal. Systolic function was severely reduced. The   estimated ejection fraction was in the range of 25% to 30%.   Akinesis of the anteroseptal, anterior, and apical myocardium.   Doppler parameters are consistent with abnormal left ventricular   relaxation  (grade 1 diastolic dysfunction).   Colonoscopy 05/16/16 IMPRESSION - Likely malignant completely obstructing tumor in the distal descending colon. Biopsied. - Diverticulosis in the left colon.   RADIOGRAPHIC STUDIES: I have personally reviewed the radiological images as listed and agreed with the findings in the report. No results found.    CT Chest W Contrast 05/17/16 IMPRESSION: Negative for metastatic or acute abnormality in the chest  CT AP W Contrast 05/09/16 IMPRESSION: 1. 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 There is masslike thickening in this region and extensive proximal stool retention. Mesocolic adenopathy, presumed metastatic node. 2. Patient experienced mild contrast reaction as described above. He return to baseline without treatment. Patient is being transferred to ER for evaluation of faintness that was present prior to contrast. 3.  Aortic Atherosclerosis (ICD10-170.0), age advanced 59. Remote left ventricular infarct with dilatation. 5. Subcentimeter hypervascular focus in the upper liver, not typical for metastatic disease. Attention on follow-up   ASSESSMENT & PLAN:  JAKYRIE TOTHEROW is a 56 y.o. male who has a history of CAD and CHF, Chronic Kidney Disease, and HLD. He is here for treatment for his colon cancer.   1. Invasive Colorectal Adenocarcinoma of the Left Colon, pT3N1aM0,  Stage IIIB, MSI-stable  -I reviewed his surgical pathology finding and scan findings with patient in great details. -He had a complete surgical resection, 1 positive lymph nodes, locally advanced. Scan showed no evidence of distant metastasis, except a indeterminate tiny liver lesion.   -  We discussed the risk of cancer recurrence, which is high due to his stage III disease. I suggest adjuvant chemotherapy due to the high risk of his cancer, which is the standard of care for stage III cancer.  -I previously discussed the standard adjuvant chemotherapy regimen FOLFOX or CAPOX. However  patient lives alone, had extensive coronary artery disease and heart failure, is not willing to consider intravenous chemotherapy. -I previously recommend him to consider single agent capecitabine as adjuvant therapy for 3-6 months. --He can start Xeloda on 6/19, but developed severe diarrhea and weight loss, and stopped after 3 days. -He was previously very reluctant to restart Xeloda. I previously recommend him to try lower dose 1063m bid for 3-4 days then 15052mbid, for two weeks on and one week off.  -He tolerated the reduced dose Xeloda better, still has diarrhea, and the lab showed acute renal failure with creatinine 1.9 today - He is dehydrated and will give him fluids today and Friday. We will hold Xeloda until Cr resolves.  -I will also lower his Xeloda to 3 in the morning and 2 in the evening when ready to restart. I will notify him after lab work next week. -If his AKI does not resolve, I may hold his adjuvant Xeloda.  2. CAD and systolic CHF with EF 2535-45%Has 2 stents placed and a pace maker -He will follow-up with his cardiologist -will watch his fluids balance to avoid overload   3. COPD -recently stopped smoking and was smoking for 40 years 2 packs a day    4. Family Support -He has low family support form siblings. They will check in but they do not help him much -He is put on disability -He feels overall more stressed  5. Financial support -He is on medicaid and disability -He does not get paid until the 27th of this month  -He received financial assistance for his Xeloda, no copay   6. Diarrhea -secondary to Xeloda and caused dehydration  -Will use Lomotil starting with 2 tablets a day then 3 tablets. If he can tolerate well he can go to 4 tablets.  -no more diarrhea with lower dose Xeloda  -Diarrhea has returned -I suggest he increases his anti-diarrhea to 4 tablets. I suggest he starts with 2 and increase based on his diarrhea frequency.  -I will prescribe  Pepcid for his stomach gurgling   7. AKI  -elevated Cr today (08/22/16) secondary to diarrhea -I will give fluids due to dehydration -Will hold Xeloda until resolved.  -if kidney function does not reverse we will stop Xeloda completely    PLAN: -Labs reviewed and Cr 1.9 -IVF 2 hrs today (arranged) and in 2-3 days, will do 1L NS over 3 hours due to his CHF  -Lab in one week, I will call him to restart Xeloda if his AKI resolves, we'll lower the dose to 150056mn the morning and 1000 mg in the evening, 2 weeks on, and one week off -Lab and in 3 weeks  -Refill pepcid and Zofran  -Hold Xeloda this week    No orders of the defined types were placed in this encounter.   All questions were answered. The patient knows to call the clinic with any problems, questions or concerns.  I spent 25 minutes counseling the patient face to face. The total time spent in the appointment was 30 minutes and more than 50% was on counseling.  This document serves as a record of services personally performed by  [name], MD. It was created on her behalf by Joslyn Devon, a trained medical scribe. The creation of this record is based on the scribe's personal observations and the provider's statements to them. This document has been checked and approved by the attending provider.      [name], MD 08/22/2016

## 2016-08-23 ENCOUNTER — Encounter: Payer: Self-pay | Admitting: Hematology

## 2016-08-24 ENCOUNTER — Other Ambulatory Visit: Payer: Self-pay | Admitting: Hematology

## 2016-08-24 ENCOUNTER — Ambulatory Visit (HOSPITAL_BASED_OUTPATIENT_CLINIC_OR_DEPARTMENT_OTHER): Payer: Medicare Other

## 2016-08-24 VITALS — BP 115/80 | HR 70 | Temp 97.8°F | Resp 20 | Ht 68.0 in | Wt 144.0 lb

## 2016-08-24 DIAGNOSIS — E860 Dehydration: Secondary | ICD-10-CM | POA: Diagnosis present

## 2016-08-24 DIAGNOSIS — C186 Malignant neoplasm of descending colon: Secondary | ICD-10-CM

## 2016-08-24 MED ORDER — SODIUM CHLORIDE 0.9 % IV SOLN
1000.0000 mL | Freq: Once | INTRAVENOUS | Status: AC
  Administered 2016-08-24: 13:00:00 via INTRAVENOUS

## 2016-08-24 NOTE — Patient Instructions (Signed)
Dehydration, Adult Dehydration is a condition in which there is not enough fluid or water in the body. This happens when you lose more fluids than you take in. Important organs, such as the kidneys, brain, and heart, cannot function without a proper amount of fluids. Any loss of fluids from the body can lead to dehydration. Dehydration can range from mild to severe. This condition should be treated right away to prevent it from becoming severe. What are the causes? This condition may be caused by:  Vomiting.  Diarrhea.  Excessive sweating, such as from heat exposure or exercise.  Not drinking enough fluid, especially: ? When ill. ? While doing activity that requires a lot of energy.  Excessive urination.  Fever.  Infection.  Certain medicines, such as medicines that cause the body to lose excess fluid (diuretics).  Inability to access safe drinking water.  Reduced physical ability to get adequate water and food.  What increases the risk? This condition is more likely to develop in people:  Who have a poorly controlled long-term (chronic) illness, such as diabetes, heart disease, or kidney disease.  Who are age 65 or older.  Who are disabled.  Who live in a place with high altitude.  Who play endurance sports.  What are the signs or symptoms? Symptoms of mild dehydration may include:  Thirst.  Dry lips.  Slightly dry mouth.  Dry, warm skin.  Dizziness. Symptoms of moderate dehydration may include:  Very dry mouth.  Muscle cramps.  Dark urine. Urine may be the color of tea.  Decreased urine production.  Decreased tear production.  Heartbeat that is irregular or faster than normal (palpitations).  Headache.  Light-headedness, especially when you stand up from a sitting position.  Fainting (syncope). Symptoms of severe dehydration may include:  Changes in skin, such as: ? Cold and clammy skin. ? Blotchy (mottled) or pale skin. ? Skin that does  not quickly return to normal after being lightly pinched and released (poor skin turgor).  Changes in body fluids, such as: ? Extreme thirst. ? No tear production. ? Inability to sweat when body temperature is high, such as in hot weather. ? Very little urine production.  Changes in vital signs, such as: ? Weak pulse. ? Pulse that is more than 100 beats a minute when sitting still. ? Rapid breathing. ? Low blood pressure.  Other changes, such as: ? Sunken eyes. ? Cold hands and feet. ? Confusion. ? Lack of energy (lethargy). ? Difficulty waking up from sleep. ? Short-term weight loss. ? Unconsciousness. How is this diagnosed? This condition is diagnosed based on your symptoms and a physical exam. Blood and urine tests may be done to help confirm the diagnosis. How is this treated? Treatment for this condition depends on the severity. Mild or moderate dehydration can often be treated at home. Treatment should be started right away. Do not wait until dehydration becomes severe. Severe dehydration is an emergency and it needs to be treated in a hospital. Treatment for mild dehydration may include:  Drinking more fluids.  Replacing salts and minerals in your blood (electrolytes) that you may have lost. Treatment for moderate dehydration may include:  Drinking an oral rehydration solution (ORS). This is a drink that helps you replace fluids and electrolytes (rehydrate). It can be found at pharmacies and retail stores. Treatment for severe dehydration may include:  Receiving fluids through an IV tube.  Receiving an electrolyte solution through a feeding tube that is passed through your nose   and into your stomach (nasogastric tube, or NG tube).  Correcting any abnormalities in electrolytes.  Treating the underlying cause of dehydration. Follow these instructions at home:  If directed by your health care provider, drink an ORS: ? Make an ORS by following instructions on the  package. ? Start by drinking small amounts, about  cup (120 mL) every 5-10 minutes. ? Slowly increase how much you drink until you have taken the amount recommended by your health care provider.  Drink enough clear fluid to keep your urine clear or pale yellow. If you were told to drink an ORS, finish the ORS first, then start slowly drinking other clear fluids. Drink fluids such as: ? Water. Do not drink only water. Doing that can lead to having too little salt (sodium) in the body (hyponatremia). ? Ice chips. ? Fruit juice that you have added water to (diluted fruit juice). ? Low-calorie sports drinks.  Avoid: ? Alcohol. ? Drinks that contain a lot of sugar. These include high-calorie sports drinks, fruit juice that is not diluted, and soda. ? Caffeine. ? Foods that are greasy or contain a lot of fat or sugar.  Take over-the-counter and prescription medicines only as told by your health care provider.  Do not take sodium tablets. This can lead to having too much sodium in the body (hypernatremia).  Eat foods that contain a healthy balance of electrolytes, such as bananas, oranges, potatoes, tomatoes, and spinach.  Keep all follow-up visits as told by your health care provider. This is important. Contact a health care provider if:  You have abdominal pain that: ? Gets worse. ? Stays in one area (localizes).  You have a rash.  You have a stiff neck.  You are more irritable than usual.  You are sleepier or more difficult to wake up than usual.  You feel weak or dizzy.  You feel very thirsty.  You have urinated only a small amount of very dark urine over 6-8 hours. Get help right away if:  You have symptoms of severe dehydration.  You cannot drink fluids without vomiting.  Your symptoms get worse with treatment.  You have a fever.  You have a severe headache.  You have vomiting or diarrhea that: ? Gets worse. ? Does not go away.  You have blood or green matter  (bile) in your vomit.  You have blood in your stool. This may cause stool to look black and tarry.  You have not urinated in 6-8 hours.  You faint.  Your heart rate while sitting still is over 100 beats a minute.  You have trouble breathing. This information is not intended to replace advice given to you by your health care provider. Make sure you discuss any questions you have with your health care provider. Document Released: 01/15/2005 Document Revised: 08/12/2015 Document Reviewed: 03/11/2015 Elsevier Interactive Patient Education  2018 [employer]  

## 2016-08-24 NOTE — Progress Notes (Signed)
Pt requesting to have this RN look at his abdominal wound, asking if I thought it was healing.  Midline abd. Wound appears to be healing via secondary intention. Pink healthy granulation tissue  Present and filling in wound. Small pea sized clear blister at 5 o'clock below main wound. Pt states this is new.  Noted 2 smaller clear blisters on main wound bed. No drainage, no odor. Baseline wound discomfort present according to pt. He would like Dr. Burr Medico to look at wound as well, though she did see it 2 days ago.Notified Dr. Burr Medico of pt's request.  Dr. Burr Medico in to see pt's wound. No order changes. Pt given supply of skin protectant, non-stick dressing pads and hypafix tape to aid in caring for this wound.

## 2016-08-29 ENCOUNTER — Other Ambulatory Visit (HOSPITAL_BASED_OUTPATIENT_CLINIC_OR_DEPARTMENT_OTHER): Payer: Medicare Other

## 2016-08-29 ENCOUNTER — Telehealth: Payer: Self-pay | Admitting: *Deleted

## 2016-08-29 DIAGNOSIS — C189 Malignant neoplasm of colon, unspecified: Secondary | ICD-10-CM | POA: Diagnosis present

## 2016-08-29 LAB — COMPREHENSIVE METABOLIC PANEL
ALT: 17 U/L (ref 0–55)
ANION GAP: 8 meq/L (ref 3–11)
AST: 17 U/L (ref 5–34)
Albumin: 3.1 g/dL — ABNORMAL LOW (ref 3.5–5.0)
Alkaline Phosphatase: 62 U/L (ref 40–150)
BUN: 22.7 mg/dL (ref 7.0–26.0)
CHLORIDE: 102 meq/L (ref 98–109)
CO2: 30 meq/L — AB (ref 22–29)
CREATININE: 1.7 mg/dL — AB (ref 0.7–1.3)
Calcium: 9.9 mg/dL (ref 8.4–10.4)
EGFR: 46 mL/min/{1.73_m2} — ABNORMAL LOW (ref >90)
GLUCOSE: 122 mg/dL (ref 70–140)
Potassium: 4.8 mEq/L (ref 3.5–5.1)
SODIUM: 140 meq/L (ref 136–145)
Total Bilirubin: 0.32 mg/dL (ref 0.20–1.20)
Total Protein: 6.4 g/dL (ref 6.4–8.3)

## 2016-08-29 LAB — CBC WITH DIFFERENTIAL/PLATELET
BASO%: 0.6 % (ref 0.0–2.0)
Basophils Absolute: 0 10*3/uL (ref 0.0–0.1)
EOS%: 5.7 % (ref 0.0–7.0)
Eosinophils Absolute: 0.4 10*3/uL (ref 0.0–0.5)
HCT: 36.9 % — ABNORMAL LOW (ref 38.4–49.9)
HGB: 12.2 g/dL — ABNORMAL LOW (ref 13.0–17.1)
LYMPH%: 15.9 % (ref 14.0–49.0)
MCH: 30.4 pg (ref 27.2–33.4)
MCHC: 33.1 g/dL (ref 32.0–36.0)
MCV: 92.1 fL (ref 79.3–98.0)
MONO#: 0.8 10*3/uL (ref 0.1–0.9)
MONO%: 10.6 % (ref 0.0–14.0)
NEUT%: 67.2 % (ref 39.0–75.0)
NEUTROS ABS: 5 10*3/uL (ref 1.5–6.5)
PLATELETS: 123 10*3/uL — AB (ref 140–400)
RBC: 4.01 10*6/uL — AB (ref 4.20–5.82)
RDW: 21.2 % — ABNORMAL HIGH (ref 11.0–14.6)
WBC: 7.4 10*3/uL (ref 4.0–10.3)
lymph#: 1.2 10*3/uL (ref 0.9–3.3)

## 2016-08-29 NOTE — Telephone Encounter (Signed)
Dr. Burr Medico reviewed lab results today.  Called pt at home and left message on voice mail re:  Per Dr. Burr Medico,  [place] to start Xeloda  1500 mg in AM,  Xeloda  1000 mg in PM  For  2 weeks on,  1 week off - when pt receives meds.  Refill escribed to [facility] today 08/29/16.   Reinforced with pt to drink more water.   Instructed pt to call office back if he experiences diarrhea with meds - pt might need to come in for IVF.

## 2016-08-30 ENCOUNTER — Other Ambulatory Visit: Payer: Self-pay | Admitting: *Deleted

## 2016-08-30 ENCOUNTER — Telehealth: Payer: Self-pay | Admitting: *Deleted

## 2016-08-30 DIAGNOSIS — C186 Malignant neoplasm of descending colon: Secondary | ICD-10-CM

## 2016-08-30 NOTE — Telephone Encounter (Signed)
Pt called reporting having frequent liquid stools after taking Xeloda 1500 mg this am.   Stated he is allergic to Xeloda. Dr. Burr Medico notified.  Spoke with pt and instructed pt re: 1.   Per Dr. Burr Medico,  [place] to STOP  Xeloda until next office visit on 09/12/16. 2.   Pt is scheduled for IVF at [facility] on Friday  08/31/16 at  1100 am. Pt voiced understanding.

## 2016-08-31 ENCOUNTER — Ambulatory Visit (HOSPITAL_COMMUNITY)
Admission: RE | Admit: 2016-08-31 | Discharge: 2016-08-31 | Disposition: A | Discharge status: Home / Self Care | Payer: Medicare Other | Source: Ambulatory Visit | Attending: Family Medicine | Admitting: Family Medicine

## 2016-08-31 DIAGNOSIS — C186 Malignant neoplasm of descending colon: Secondary | ICD-10-CM

## 2016-08-31 MED ORDER — SODIUM CHLORIDE 0.9 % IV SOLN
Freq: Once | INTRAVENOUS | Status: AC
  Administered 2016-08-31: 11:00:00 via INTRAVENOUS

## 2016-08-31 NOTE — Progress Notes (Signed)
Diagnosis Association: Adenocarcinoma of descending colon pT3, pN1a, pMX s/p colectomy/ostomy 05/18/2016  Provider: [name]  Procedure: Pt received 1 lite normal saline over 2 hours.  Pt tolerated procedure well.  Post procedure: Pt alert, oriented and ambulatory at discharge. D/C instructions given with verbal understanding.

## 2016-08-31 NOTE — Discharge Instructions (Signed)
Today, you received an IV infusion of 1 liter normal saline per order. Please see ordering provider for additional follow-up instructions.

## 2016-09-04 ENCOUNTER — Telehealth: Payer: Self-pay | Admitting: *Deleted

## 2016-09-04 NOTE — Telephone Encounter (Signed)
"  [place] with [employer] in [place], [place].  Need clarification of the Xeloda 500 mg refill for this patient.  Quantity of 84 ordered but he is to take three in the am and two pills in the pm two (2) weeks on, one (1) week off.  Please call [phone]), fax [phone]) or send eRx order to clarify."

## 2016-09-10 NOTE — Progress Notes (Signed)
[place]  [phone] [phone]) [phone]  Clinic Follow-up Note   Patient Care Team: Christopher Nearing, MD as PCP - General (Family Medicine) Christopher Lance, MD as Consulting Physician (Cardiology) Christopher Reedy, MD as Consulting Physician (Cardiology) Christopher Merle, MD as Consulting Physician (Hematology) Christopher Skeans, MD as Consulting Physician (General Surgery) 09/12/2016  CHIEF COMPLAINTS:  Follow up left side colon cancer, stage IIIB  Oncology History   Cancer Staging Adenocarcinoma of descending colon  pT3, pN1a, pMX s/p colectomy/ostomy 05/18/2016 Staging form: Colon and Rectum, AJCC 8th Edition - Pathologic stage from 05/18/2016: Stage IIIB (pT3, pN1a, cM0) - Signed by Christopher Merle, MD on 07/01/2016       Adenocarcinoma of descending colon  pT3, pN1a, pMX s/p colectomy/ostomy 05/18/2016   05/09/2016 Imaging    CT chest, abdomen and pelvis showed probable descending colon carcinoma. No evidence of distant metastasis. 7 cm hypervascular focus in the upper liver, not typical for metastatic disease.      05/16/2016 Procedure    Colonoscopy showed a large mass which completely obstructss the distal descending colon. Biopsied. Diverticulosis in the left colon.      05/18/2016 Surgery    Left hemicolectomy by Christopher [name]      05/18/2016 Pathology Results    Left colon segmental resection shows invasive colorectal adenocarcinoma, 4.6 cm, tumor extends into subserosa connective tissue, margins are negative, metastatic carcinoma in 1 of 12 lymph nodes. Grade 2, lymphovascular invasion present, perineural invasion negative.      05/18/2016 Miscellaneous    MSI-stable       05/18/2016 Initial Diagnosis    Adenocarcinoma of descending colon  pT3, pN1a, pMX s/p colectomy/ostomy 05/18/2016      07/16/2016 - 08/22/2016 Chemotherapy    Xeloda twice a day for 2 weeks on and 1 week off.  stopped after 3 days due to severe diarrhea, and changed to 1580m  bid, started on 07/31/2016; Hold Xeloda starting 08/22/16 due to AKI. Will lower Xeloda to 3 in the morning and 2 in the evening when restart next cycle.   Stopped due to poor tolerance of sever diarrhea.         HISTORY OF PRESENTING ILLNESS:  07/02/16 Christopher Spies56y.o. male is here because of recent diagnosis of Invasive Colorectal Adenocarcinoma of the Colon. Initially he was bleeding on and off and had a a lot of gas and constipation. He had a [place]oreain December and that did not cover his bowel. He also felt a line appearing on his stomach. These symptoms in October. He also had tingling. He went to ED 05/09/16 for syncope. There he had a CT AP scan that showed evidence of a mass in his colon. Followed by a live Colonoscopy and biopsy on 4/18 done by Dr. HMallie Musselshowing evidence the mass was cancer. He had a colectomy surgery 05/18/16 and the results showed to be Invasive olorectal Adenocarcinoma of the Colon. During his colonoscopy process he could not complete pre treatment  Because the cancer cut it off the path to his bowel and caused him to throw up with blood, so he did a live colonoscopy. PCP is Christopher [name] He had gotten staph epidermitis in his incision while in the hospital his incision is half healed now.   He has a history of a pPsychologist, forensic He had 2 heart attacks and 2 stents placed and CHF.   He presents to the clinic today post colectomy. He lives by himself and he  feels Ok now but he feels like he is not prepared for chemo and the sickness that comes with it. He was told to change his dressing every 3 days and he now changes it everyday. He had painful swallowing after surgery which has resolved and some ulceration in his mouth seen by his dentist. He is taking ferrous sulfate. He has a lot of stress because he is not sure what was really going on. He is taking baby aspirin. His appetite and energy level is back to normal. He lost weight after surgery. He lost 20 pounds since surgery. He has no  feet swelling and he pain on his chest around his pace maker which was put in in August 2015. He gets regularly checked for HIV. He wants to know why he cannot use target therapy. He does not want to do IV Chemotherapy. His regular cardiologist keeps him on his regular heart medication and after the surgery he got nitro. He is going to see his cardiologist on the 19th.    CURRENT THERAPY:  Surveillance   INTERVAL HISTORY:  Christopher [name] is here for a follow-up. He presents to the clinic today reporting he has gained weight since stopping his Xeloda and slowing his BM. He now has regular BM. He did the IV dye and had an allergic reaction. He felt paralyzed. He was later able to do contrast at 60% and he was able to work with it. He was told he can get reverse surgery after he stops chemo. He is apprehensive about another surgery due to bills and concerns with complications. He lives on [employer] security. He also does not feel comfortable with the staff as well.     MEDICAL HISTORY:  Past Medical History:  Diagnosis Date  . Automatic implantable cardioverter-defibrillator in situ    MDT Aug 2015 Christopher [name]  . CAD (coronary artery disease), native coronary artery 07/03/2013   Cath 06/20/13  Normal left main, occluded LAD, occluded RCA, 50% circ EF 15%  3.0 x28 and 3.0 x 8 mm Xience stent Christopher [name]  To LAD   . Cancer ([place])   . Chronic kidney disease   . Chronic systolic CHF (congestive heart failure) (H[facility])    ECHO 08/05/13  EF 30%  Anterior akinesis and inferior hypokinesis   . COPD (chronic obstructive pulmonary disease) ([place])   . Hyperlipidemia   . Mass of colon 04/2016  . Myocardial infarction ([place]) 04/29/2013  . Thrombocytopenia ([place]) 12/27/2014   Chronic      SURGICAL HISTORY: Past Surgical History:  Procedure Laterality Date  . CARDIAC CATHETERIZATION  05/2013   . CARDIAC CATHETERIZATION N/A 12/28/2014   Procedure: Left Heart Cath and Coronary Angiography;  Surgeon: Christopher Sine, MD;   Location: [place] [facility];  Service: Cardiovascular;  Laterality: N/A;  . CARDIAC CATHETERIZATION N/A 12/28/2014   Procedure: Coronary Stent Intervention;  Surgeon: Christopher Sine, MD;  Location: [facility];  Service: Cardiovascular;  Laterality: N/A;  . COLECTOMY WITH COLOSTOMY CREATION/HARTMANN PROCEDURE Left 05/18/2016   Procedure: COLECTOMY WITH OSTOMY CREATION/HARTMANN PROCEDURE;  Surgeon: Christopher Skeans, MD;  Location: [facility];  Service: General;  Laterality: Left;  . CORONARY ANGIOPLASTY  05/2013   . FLEXIBLE SIGMOIDOSCOPY N/A 05/16/2016   Procedure: FLEXIBLE SIGMOIDOSCOPY;  Surgeon: [name], MD;  Location: [facility];  Service: Gastroenterology;  Laterality: N/A;  . ICD placement  09/07/2013   . IMPLANTABLE CARDIOVERTER DEFIBRILLATOR IMPLANT N/A 09/07/2013   Procedure: IMPLANTABLE CARDIOVERTER  DEFIBRILLATOR IMPLANT;  Surgeon: Christopher Lance, MD;  Location: [facility] CATH LAB;  Service: Cardiovascular;  Laterality: N/A;  . INTRA-AORTIC BALLOON PUMP INSERTION  06/20/2013   Procedure: INTRA-AORTIC BALLOON PUMP INSERTION;  Surgeon: Sinclair Grooms, MD;  Location: [facility] CATH LAB;  Service: Cardiovascular;;  . LEFT HEART CATHETERIZATION WITH CORONARY ANGIOGRAM N/A 06/20/2013   Procedure: LEFT HEART CATHETERIZATION WITH CORONARY ANGIOGRAM;  Surgeon: Sinclair Grooms, MD;  Location: [facility] CATH LAB;  Service: Cardiovascular;  Laterality: N/A;  . PERCUTANEOUS CORONARY STENT INTERVENTION (PCI-S)  06/20/2013   Procedure: PERCUTANEOUS CORONARY STENT INTERVENTION (PCI-S);  Surgeon: Sinclair Grooms, MD;  Location: [facility] CATH LAB;  Service: Cardiovascular;;    SOCIAL HISTORY: Social History   Social History  . Marital status: Single    Spouse name: N/A  . Number of children: 0  . Years of education: 12   Occupational History  . Unemployed     Social History Main Topics  . Smoking status: Former Smoker    Packs/day: 2.00    Years: 40.00    Types: Cigarettes    Quit date: 12/22/2014  .  Smokeless tobacco: Never Used  . Alcohol use No     Comment: quit in 08/2013, he used to drink alcohol moderate (1 pine liquor one week)  for 30 years   . Drug use: No  . Sexual activity: Not Currently    Birth control/ protection: Condom     Comment: men    Other Topics Concern  . Not on file   Social History Narrative   Lives alone.    In a house trying to sell his home. Exercise: No.    FAMILY HISTORY: Family History  Problem Relation Age of Onset  . Emphysema Father   . Heart disease Mother   . Dementia Mother   . Diabetes Mother   . Pancreatic cancer Maternal Grandmother   . Lung cancer Maternal Grandmother   . Cancer Neg Hx     ALLERGIES:  is allergic to celebrex [celecoxib] and contrast media [iodinated diagnostic agents].  MEDICATIONS:  Current Outpatient Prescriptions  Medication Sig Dispense Refill  . aspirin 81 MG chewable tablet Chew 1 tablet (81 mg total) by mouth daily. 30 tablet 5  . atorvastatin (LIPITOR) 80 MG tablet Take 1 tablet (80 mg total) by mouth daily at 6 PM. 30 tablet 5  . carvedilol (COREG) 3.125 MG tablet Take 1 tablet (3.125 mg total) by mouth 2 (two) times daily with a meal. 60 tablet 0  . clopidogrel (PLAVIX) 75 MG tablet Take 75 mg by mouth daily.    . digoxin (LANOXIN) 0.125 MG tablet Take 1 tablet (0.125 mg total) by mouth daily. 30 tablet 5  . diphenoxylate-atropine (LOMOTIL) 2.5-0.025 MG tablet Take 2 tablets by mouth 4 (four) times daily as needed for diarrhea or loose stools. 30 tablet 3  . famotidine (PEPCID) 20 MG tablet Take 1 tablet (20 mg total) by mouth 2 (two) times daily. 30 tablet 0  . feeding supplement, ENSURE ENLIVE, (ENSURE ENLIVE) LIQD Take 237 mLs by mouth 3 (three) times daily between meals. 237 mL 12  . ferrous sulfate 325 (65 FE) MG tablet Take 1 tablet (325 mg total) by mouth 2 (two) times daily with a meal. 60 tablet 3  . lisinopril (PRINIVIL,ZESTRIL) 2.5 MG tablet Take 1 tablet (2.5 mg total) by mouth daily. 30  tablet 5  . loperamide (IMODIUM) 2 MG capsule Take 1 capsule (2 mg total) by  mouth 4 (four) times daily as needed for diarrhea or loose stools. 12 capsule 0  . Multiple Vitamin (MULTIVITAMIN WITH MINERALS) TABS tablet Take 1 tablet by mouth daily. 30 tablet 0  . Na Sulfate-K Sulfate-Mg Sulf 17.5-3.13-1.6 GM/180ML SOLN Suprep-Use as directed 354 mL 0  . ondansetron (ZOFRAN) 4 MG tablet Take 1 tablet (4 mg total) by mouth every 6 (six) hours. 12 tablet 0  . saccharomyces boulardii (FLORASTOR) 250 MG capsule Take 1 capsule (250 mg total) by mouth 2 (two) times daily. 30 capsule 0  . capecitabine (XELODA) 500 MG tablet Take 3 tabs ( 1500 mg ) in the morning, and  2 tabs ( 1000 mg ) in the evening  -  For  2 weeks on ,  1 week off. (Patient not taking: Reported on 09/12/2016) 84 tablet 2  . nitroGLYCERIN (NITROSTAT) 0.4 MG SL tablet Place 1 tablet (0.4 mg total) under the tongue every 5 (five) minutes as needed for chest pain. (Patient not taking: Reported on 08/07/2016) 20 tablet 0   No current facility-administered medications for this visit.     REVIEW OF SYSTEMS:   Constitutional: Denies fevers, chills or abnormal night sweats  (+) good appetite (+ )weighrt gain  Eyes: Denies blurriness of vision, double vision or watery eyes Ears, nose, mouth, throat, and face: Denies mucositis or sore throat  Respiratory: Denies cough, dyspnea or wheezes Cardiovascular: Denies palpitation, chest discomfort or lower extremity swelling Gastrointestinal:  Denies nausea, heartburn  (+) Sore and tight around incision of lower midline. (+) acid reflux   Skin: Denies abnormal skin rashes Lymphatics: Denies new lymphadenopathy or easy bruising Neurological:Denies numbness, tingling or new weaknesses Behavioral/Psych: Mood is stable, no new changes  All other systems were reviewed with the patient and are negative.   PHYSICAL EXAMINATION: ECOG PERFORMANCE STATUS: 0 - Asymptomatic  Vitals:   09/12/16 1445  BP:  107/70  Pulse: 72  Resp: 17  Temp: 97.9 F (36.6 C)  SpO2: 100%   Filed Weights   09/12/16 1445  Weight: 149 lb 3.2 oz (67.7 kg)    GENERAL:alert, no distress and comfortable SKIN: skin color, texture, turgor are normal, no rashes or significant lesions EYES: normal, conjunctiva are pink and non-injected, sclera clear OROPHARYNX:no exudate, no erythema and lips, buccal mucosa, and tongue normal  NECK: supple, thyroid normal size, non-tender, without nodularity LYMPH:  no palpable lymphadenopathy in the cervical, axillary or inguinal LUNGS: clear to auscultation and percussion with normal breathing effort HEART: regular rate & rhythm and no murmurs and no lower extremity edema ABDOMEN:abdomen soft, non-tender and normal bowel sounds (+) incision is pretty clean, with a large open wound and pink granulation tissue (+) incision is still open but smaller.  Musculoskeletal:no cyanosis of digits and no clubbing  PSYCH: alert & oriented x 3 with fluent speech NEURO: no focal motor/sensory deficits  LABORATORY DATA:  I have reviewed the data as listed CBC Latest Ref Rng & Units 09/12/2016 08/29/2016 08/22/2016  WBC 4.0 - 10.3 10e3/uL 7.2 7.4 6.3  Hemoglobin 13.0 - 17.1 g/dL 11.4(L) 12.2(L) 13.2  Hematocrit 38.4 - 49.9 % 34.5(L) 36.9(L) 39.6  Platelets 140 - 400 10e3/uL 169 123(L) 129(L)    CMP Latest Ref Rng & Units 09/12/2016 08/29/2016 08/22/2016  Glucose 70 - 140 mg/dl 107 122 95  BUN 7.0 - 26.0 mg/dL 16.3 22.7 28.2(H)  Creatinine 0.7 - 1.3 mg/dL 1.2 1.7(H) 1.9(H)  Sodium 136 - 145 mEq/L 140 140 135(L)  Potassium 3.5 - 5.1  mEq/L 4.5 4.8 4.0  Chloride 101 - 111 mmol/L - - -  CO2 22 - 29 mEq/L 25 30(H) 19(L)  Calcium 8.4 - 10.4 mg/dL 9.4 9.9 9.3  Total Protein 6.4 - 8.3 g/dL 6.4 6.4 6.4  Total Bilirubin 0.20 - 1.20 mg/dL 0.25 0.32 0.36  Alkaline Phos 40 - 150 U/L 56 62 58  AST 5 - 34 U/L _0 ALT 0 - 55 U/L _1 PATHOLOGY  Diagnosis 05/18/16 Colon, segmental resection  for tumor, Left - INVASIVE COLORECTAL ADENOCARCINOMA, 4.6 CM. - TUMOR EXTENDS INTO SUBSEROSAL CONNECTIVE TISSUE. - MARGINS NOT INVOLVED. - METASTATIC CARCINOMA IN ONE OF TWELVE LYMPH NODES (1/12). Microscopic Comment COLON AND RECTUM (INCLUDING TRANS-ANAL RESECTION): Specimen: Left colon. Procedure: Segmental resection. Tumor site: Descending colon. Specimen integrity: Intact. Macroscopic intactness of mesorectum: Not applicable. Macroscopic tumor perforation: No Invasive tumor: Maximum size: 4.6 cm Histologic type(s): Colorectal adenocarcinoma Histologic grade and differentiation: G2: moderately differentiated/low grade Type of polyp in which invasive carcinoma arose: No residual polyp Microscopic extension of invasive tumor: Through muscularis propria into subserosal connective tissue. Lymph-Vascular invasion: Present. Peri-neural invasion: No Tumor deposit(s) (discontinuous extramural extension): No Resection margins: Proximal margin: Free of tumor Distal margin: Free of tumor Circumferential (radial) (posterior ascending, posterior descending; lateral and posterior mid-rectum; and entire lower 1/3 rectum): Free of tumor Mesenteric margin (sigmoid and transverse): N/A Distance closest margin (if all above margins negative): 0.5 cm from radial margin Treatment effect (neo-adjuvant therapy): No Microscopic Comment(continued) Additional polyp(s): No Non-neoplastic findings: N/A Lymph nodes: number examined 12; number positive: 1 Pathologic Staging: pT3, pN1a, pMX Ancillary studies: MMR by IHC and MSI by PCR   Diagnosis 05/16/16 Colon, biopsy, Left Descending - ADENOCARCINOMA.  PROCEDURES  ECHO 05/17/16 Study Conclusions - Left ventricle: The cavity size was mildly dilated. Wall   thickness was normal. Systolic function was severely reduced. The   estimated ejection fraction was in the range of 25% to 30%.   Akinesis of the anteroseptal, anterior, and apical  myocardium.   Doppler parameters are consistent with abnormal left ventricular   relaxation (grade 1 diastolic dysfunction).   Colonoscopy 05/16/16 IMPRESSION - Likely malignant completely obstructing tumor in the distal descending colon. Biopsied. - Diverticulosis in the left colon.   RADIOGRAPHIC STUDIES: I have personally reviewed the radiological images as listed and agreed with the findings in the report. No results found.    CT Chest W Contrast 05/17/16 IMPRESSION: Negative for metastatic or acute abnormality in the chest  CT AP W Contrast 05/09/16 IMPRESSION: 1. 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 There is masslike thickening in this region and extensive proximal stool retention. Mesocolic adenopathy, presumed metastatic node. 2. Patient experienced mild contrast reaction as described above. He return to baseline without treatment. Patient is being transferred to ER for evaluation of faintness that was present prior to contrast. 3.  Aortic Atherosclerosis (ICD10-170.0), age advanced 26. Remote left ventricular infarct with dilatation. 5. Subcentimeter hypervascular focus in the upper liver, not typical for metastatic disease. Attention on follow-up   ASSESSMENT & PLAN:  SHERRILL MCKAMIE is a 56 y.o. male who has a history of CAD and CHF, Chronic Kidney Disease, and HLD. He is here for treatment for his colon cancer.   1. Invasive Colorectal Adenocarcinoma of the Left Colon, pT3N1aM0,  Stage IIIB, MSI-stable  -I reviewed his surgical pathology finding and scan findings with patient in great details. -He had a complete surgical resection, 1 positive lymph nodes, locally advanced. Scan  showed no evidence of distant metastasis, except a indeterminate tiny liver lesion.   -We discussed the risk of cancer recurrence, which is high due to his stage III disease. I suggest adjuvant chemotherapy due to the high risk of his cancer, which is the standard of care for stage III cancer.   -I previously discussed the standard adjuvant chemotherapy regimen FOLFOX or CAPOX. However patient lives alone, had extensive coronary artery disease and heart failure, is not willing to consider intravenous chemotherapy. -I previously recommend him to consider single agent capecitabine as adjuvant therapy for 3-6 months. --He started Xeloda on 6/19, but developed severe diarrhea and weight loss, and stopped after 3 days. -He did try dose reduction on Xeloda, still has significant diarrhea with dehydration and a KI, and we stopped it. -He has recovered well from chemotherapy.  -Okay to reverse his colostomy. However patient still has some financial issues, may want to wait until next year. -Labs reviewed and Cr has improved to 1.2, CBC and CMP overall normal.  --I discussed the risk of cancer recurrence in the future. I discussed the surveillance plan, which is a physical exam and lab test (including CBC, CMP and CEA) every 3-4 months for the first 2 years, then every 6-12 months, colonoscopy in one year, and surveilliance CT scan every 6-12 month for up to 5 year.    2. CAD and systolic CHF with EF 13-08% -Has 2 stents placed and a pace maker -He will follow-up with his cardiologist -will watch his fluids balance to avoid overload   3. COPD -recently stopped smoking and was smoking for 40 years 2 packs a day    4. Family Support -He has low family support from siblings. They will check in but they do not help him much -He is put on disability -He feels overall more stressed  5. Financial support -He is on medicaid and disability -He does not get paid until the 27th of this month  -He received financial assistance for his Xeloda, no copay  - He is on [employer]. He shares concern with paying his medical bills with [employer].   -I can refer to social work, if he requests.   6. Diarrhea -secondary to Xeloda and caused dehydration  -resolved now    7. AKI  -elevated Cr today  (08/22/16) secondary to diarrhea -resolved now   PLAN: -Refer back to Christopher [name] about colostomy reversion  -Labs reviewed and Cr 1.2, much improved -F/u in 4 months  -Lab and CT scan Abd/pel 1-2 days before f/u    Orders Placed This Encounter  Procedures  . CT Abdomen Pelvis W Contrast    Standing Status:   Future    Standing Expiration Date:   09/12/2017    Order Specific Question:   If indicated for the ordered procedure, I authorize the administration of contrast media per Radiology protocol    Answer:   Yes    Order Specific Question:   Preferred imaging location?    Answer:   [facility]    Order Specific Question:   Radiology Contrast Protocol - do NOT remove file path    Answer:   [email or web address]  . CT Chest W Contrast    Standing Status:   Future    Standing Expiration Date:   09/12/2017    Order Specific Question:   If indicated for the ordered procedure, I authorize the administration of contrast media per Radiology protocol    Answer:   Yes  Order Specific Question:   Preferred imaging location?    Answer:   [facility]    Order Specific Question:   Call Results- Best Contact Number?    Answer:   surveillance    Order Specific Question:   Radiology Contrast Protocol - do NOT remove file path    Answer:   [email or web address]    All questions were answered. The patient knows to call the clinic with any problems, questions or concerns.  I spent 20 minutes counseling the patient face to face. The total time spent in the appointment was 25 minutes and more than 50% was on counseling.  This document serves as a record of services personally performed by Christopher Merle, MD. It was created on her behalf by Joslyn Devon, a trained medical scribe. The creation of this record is based on the scribe's personal observations and the provider's statements to them. This document has been checked and approved by the  attending provider.      Christopher Merle, MD 09/12/2016

## 2016-09-12 ENCOUNTER — Encounter: Payer: Self-pay | Admitting: Internal Medicine

## 2016-09-12 ENCOUNTER — Other Ambulatory Visit (HOSPITAL_BASED_OUTPATIENT_CLINIC_OR_DEPARTMENT_OTHER): Payer: Medicare Other

## 2016-09-12 ENCOUNTER — Telehealth: Payer: Self-pay | Admitting: Hematology

## 2016-09-12 ENCOUNTER — Ambulatory Visit (HOSPITAL_BASED_OUTPATIENT_CLINIC_OR_DEPARTMENT_OTHER): Payer: Medicare Other | Admitting: Hematology

## 2016-09-12 VITALS — BP 107/70 | HR 72 | Temp 97.9°F | Resp 17 | Ht 68.0 in | Wt 149.2 lb

## 2016-09-12 DIAGNOSIS — D500 Iron deficiency anemia secondary to blood loss (chronic): Secondary | ICD-10-CM | POA: Diagnosis not present

## 2016-09-12 DIAGNOSIS — C186 Malignant neoplasm of descending colon: Secondary | ICD-10-CM

## 2016-09-12 DIAGNOSIS — I5022 Chronic systolic (congestive) heart failure: Secondary | ICD-10-CM

## 2016-09-12 DIAGNOSIS — C189 Malignant neoplasm of colon, unspecified: Secondary | ICD-10-CM

## 2016-09-12 DIAGNOSIS — I25110 Atherosclerotic heart disease of native coronary artery with unstable angina pectoris: Secondary | ICD-10-CM

## 2016-09-12 LAB — COMPREHENSIVE METABOLIC PANEL
ALBUMIN: 3.4 g/dL — AB (ref 3.5–5.0)
ALK PHOS: 56 U/L (ref 40–150)
ALT: 17 U/L (ref 0–55)
AST: 21 U/L (ref 5–34)
Anion Gap: 7 mEq/L (ref 3–11)
BILIRUBIN TOTAL: 0.25 mg/dL (ref 0.20–1.20)
BUN: 16.3 mg/dL (ref 7.0–26.0)
CALCIUM: 9.4 mg/dL (ref 8.4–10.4)
CO2: 25 mEq/L (ref 22–29)
Chloride: 108 mEq/L (ref 98–109)
Creatinine: 1.2 mg/dL (ref 0.7–1.3)
EGFR: 70 mL/min/{1.73_m2} — AB (ref >90)
GLUCOSE: 107 mg/dL (ref 70–140)
POTASSIUM: 4.5 meq/L (ref 3.5–5.1)
SODIUM: 140 meq/L (ref 136–145)
TOTAL PROTEIN: 6.4 g/dL (ref 6.4–8.3)

## 2016-09-12 LAB — CBC WITH DIFFERENTIAL/PLATELET
BASO%: 0.4 % (ref 0.0–2.0)
BASOS ABS: 0 10*3/uL (ref 0.0–0.1)
EOS ABS: 0.3 10*3/uL (ref 0.0–0.5)
EOS%: 4.7 % (ref 0.0–7.0)
HEMATOCRIT: 34.5 % — AB (ref 38.4–49.9)
HEMOGLOBIN: 11.4 g/dL — AB (ref 13.0–17.1)
LYMPH#: 2.2 10*3/uL (ref 0.9–3.3)
LYMPH%: 30.3 % (ref 14.0–49.0)
MCH: 30.6 pg (ref 27.2–33.4)
MCHC: 33 g/dL (ref 32.0–36.0)
MCV: 92.7 fL (ref 79.3–98.0)
MONO#: 0.6 10*3/uL (ref 0.1–0.9)
MONO%: 8.5 % (ref 0.0–14.0)
NEUT%: 56.1 % (ref 39.0–75.0)
NEUTROS ABS: 4 10*3/uL (ref 1.5–6.5)
PLATELETS: 169 10*3/uL (ref 140–400)
RBC: 3.72 10*6/uL — ABNORMAL LOW (ref 4.20–5.82)
RDW: 18.9 % — ABNORMAL HIGH (ref 11.0–14.6)
WBC: 7.2 10*3/uL (ref 4.0–10.3)

## 2016-09-12 LAB — FERRITIN: FERRITIN: 42 ng/mL (ref 22–316)

## 2016-09-12 NOTE — Telephone Encounter (Signed)
Scheduled appt per 8/15 los - Gave patient AVS and calender per [facility] to contact patient with CT schedule.

## 2016-09-18 ENCOUNTER — Encounter: Payer: Self-pay | Admitting: Gastroenterology

## 2016-09-18 ENCOUNTER — Telehealth: Payer: Self-pay | Admitting: Gastroenterology

## 2016-09-18 NOTE — Telephone Encounter (Signed)
Spoke to patient, this is a recall letter. Recall was put in system in April. Patient still has colostomy, meets back with surgeon in next few weeks. Instructed patient to contact our office upon advice from oncology and surgeon's office.

## 2016-09-25 DIAGNOSIS — I2510 Atherosclerotic heart disease of native coronary artery without angina pectoris: Secondary | ICD-10-CM | POA: Diagnosis not present

## 2016-09-25 DIAGNOSIS — I252 Old myocardial infarction: Secondary | ICD-10-CM | POA: Diagnosis not present

## 2016-09-25 DIAGNOSIS — C186 Malignant neoplasm of descending colon: Secondary | ICD-10-CM | POA: Diagnosis not present

## 2016-09-25 DIAGNOSIS — Z955 Presence of coronary angioplasty implant and graft: Secondary | ICD-10-CM | POA: Diagnosis not present

## 2016-09-25 DIAGNOSIS — Z01810 Encounter for preprocedural cardiovascular examination: Secondary | ICD-10-CM | POA: Diagnosis not present

## 2016-09-25 DIAGNOSIS — E785 Hyperlipidemia, unspecified: Secondary | ICD-10-CM | POA: Diagnosis not present

## 2016-09-25 DIAGNOSIS — R002 Palpitations: Secondary | ICD-10-CM | POA: Diagnosis not present

## 2016-09-25 DIAGNOSIS — I5022 Chronic systolic (congestive) heart failure: Secondary | ICD-10-CM | POA: Diagnosis not present

## 2016-09-25 DIAGNOSIS — M5136 Other intervertebral disc degeneration, lumbar region: Secondary | ICD-10-CM | POA: Diagnosis not present

## 2016-09-25 DIAGNOSIS — Z95810 Presence of automatic (implantable) cardiac defibrillator: Secondary | ICD-10-CM | POA: Diagnosis not present

## 2016-10-04 ENCOUNTER — Ambulatory Visit (INDEPENDENT_AMBULATORY_CARE_PROVIDER_SITE_OTHER): Payer: Medicare Other | Admitting: Internal Medicine

## 2016-10-04 ENCOUNTER — Encounter: Payer: Self-pay | Admitting: Internal Medicine

## 2016-10-04 ENCOUNTER — Ambulatory Visit: Payer: Medicare Other | Attending: Internal Medicine

## 2016-10-04 VITALS — BP 120/82 | HR 77 | Ht 68.0 in | Wt 158.6 lb

## 2016-10-04 DIAGNOSIS — Z23 Encounter for immunization: Secondary | ICD-10-CM

## 2016-10-04 DIAGNOSIS — I5022 Chronic systolic (congestive) heart failure: Secondary | ICD-10-CM | POA: Diagnosis not present

## 2016-10-04 DIAGNOSIS — I255 Ischemic cardiomyopathy: Secondary | ICD-10-CM

## 2016-10-04 NOTE — Patient Instructions (Signed)
Medication Instructions:  Your physician recommends that you continue on your current medications as directed. Please refer to the Current Medication list given to you today.  Labwork: None ordered.  Testing/Procedures: None ordered.  Follow-Up: Your physician wants you to follow-up in: one year with Dr. Lovena Le.   You will receive a reminder letter in the mail two months in advance. If you don't receive a letter, please call our office to schedule the follow-up appointment.  Remote monitoring is used to monitor your ICD from home. This monitoring reduces the number of office visits required to check your device to one time per year. It allows [place] to keep an eye on the functioning of your device to ensure it is working properly. You are scheduled for a device check from home on 01/03/2017. You may send your transmission at any time that day. If you have a wireless device, the transmission will be sent automatically. After your physician reviews your transmission, you will receive a postcard with your next transmission date.    Any Other Special Instructions Will Be Listed Below (If Applicable).     If you need a refill on your cardiac medications before your next appointment, please call your pharmacy.

## 2016-10-04 NOTE — Progress Notes (Signed)
HPI Mr. Chouinard returns today for follow-up. He is a very pleasant 56 year old man with chronic systolic heart failure, and ischemic cardiomyopathy, coronary artery disease status post MI, who developed colon cancer in the interim and underwent resection. He has a diverting colostomy in place. The patient denies chest pain or shortness of breath. He remains anxious. He admits to some dietary indiscretion with sodium. Allergies  Allergen Reactions  . Capecitabine Other (See Comments)    Server dehydration and diarrhea      . Celebrex [Celecoxib] Other (See Comments)    Caused his back to burn  . Contrast Media [Iodinated Diagnostic Agents] Other (See Comments)    Patient feels weakness     Current Outpatient Prescriptions  Medication Sig Dispense Refill  . aspirin 81 MG chewable tablet Chew 1 tablet (81 mg total) by mouth daily. 30 tablet 5  . atorvastatin (LIPITOR) 80 MG tablet Take 1 tablet (80 mg total) by mouth daily at 6 PM. 30 tablet 5  . carvedilol (COREG) 3.125 MG tablet Take 1 tablet (3.125 mg total) by mouth 2 (two) times daily with a meal. 60 tablet 0  . clopidogrel (PLAVIX) 75 MG tablet Take 75 mg by mouth daily.    . digoxin (LANOXIN) 0.125 MG tablet Take 1 tablet (0.125 mg total) by mouth daily. 30 tablet 5  . feeding supplement, ENSURE ENLIVE, (ENSURE ENLIVE) LIQD Take 237 mLs by mouth 3 (three) times daily between meals. 237 mL 12  . ferrous sulfate 325 (65 FE) MG tablet Take 1 tablet (325 mg total) by mouth 2 (two) times daily with a meal. 60 tablet 3  . lisinopril (PRINIVIL,ZESTRIL) 2.5 MG tablet Take 1 tablet (2.5 mg total) by mouth daily. 30 tablet 5  . Multiple Vitamin (MULTIVITAMIN WITH MINERALS) TABS tablet Take 1 tablet by mouth daily. 30 tablet 0  . nitroGLYCERIN (NITROSTAT) 0.4 MG SL tablet Place 1 tablet (0.4 mg total) under the tongue every 5 (five) minutes as needed for chest pain. 20 tablet 0  . ondansetron (ZOFRAN) 4 MG tablet Take 1 tablet (4 mg  total) by mouth every 6 (six) hours. 12 tablet 0  . saccharomyces boulardii (FLORASTOR) 250 MG capsule Take 1 capsule (250 mg total) by mouth 2 (two) times daily. 30 capsule 0   No current facility-administered medications for this visit.      Past Medical History:  Diagnosis Date  . Automatic implantable cardioverter-defibrillator in situ    MDT Aug 2015 Dr. Lovena Le  . CAD (coronary artery disease), native coronary artery 07/03/2013   Cath 06/20/13  Normal left main, occluded LAD, occluded RCA, 50% circ EF 15%  3.0 x28 and 3.0 x 8 mm Xience stent Dr. Tamala Julian  To LAD   . Cancer ([place])   . Chronic kidney disease   . Chronic systolic CHF (congestive heart failure) ([facility])    ECHO 08/05/13  EF 30%  Anterior akinesis and inferior hypokinesis   . COPD (chronic obstructive pulmonary disease) ([place])   . Hyperlipidemia   . Mass of colon 04/2016  . Myocardial infarction ([place]) 04/29/2013  . Thrombocytopenia ([place]) 12/27/2014   Chronic      ROS:   All systems reviewed and negative except as noted in the HPI.   Past Surgical History:  Procedure Laterality Date  . CARDIAC CATHETERIZATION  05/2013   . CARDIAC CATHETERIZATION N/A 12/28/2014   Procedure: Left Heart Cath and Coronary Angiography;  Surgeon: [name], MD;  Location: [place]  [facility];  Service: Cardiovascular;  Laterality: N/A;  . CARDIAC CATHETERIZATION N/A 12/28/2014   Procedure: Coronary Stent Intervention;  Surgeon: [name], MD;  Location: [place] [facility];  Service: Cardiovascular;  Laterality: N/A;  . COLECTOMY WITH COLOSTOMY CREATION/HARTMANN PROCEDURE Left 05/18/2016   Procedure: COLECTOMY WITH OSTOMY CREATION/HARTMANN PROCEDURE;  Surgeon: [name], MD;  Location: [place];  Service: General;  Laterality: Left;  . CORONARY ANGIOPLASTY  05/2013   . FLEXIBLE SIGMOIDOSCOPY N/A 05/16/2016   Procedure: FLEXIBLE SIGMOIDOSCOPY;  Surgeon: [name], MD;  Location: [place];  Service: Gastroenterology;  Laterality:  N/A;  . ICD placement  09/07/2013   . IMPLANTABLE CARDIOVERTER DEFIBRILLATOR IMPLANT N/A 09/07/2013   Procedure: IMPLANTABLE CARDIOVERTER DEFIBRILLATOR IMPLANT;  Surgeon: [name], MD;  Location[facility] CATH LAB;  Service: Cardiovascular;  Laterality: N/A;  . INTRA-AORTIC BALLOON PUMP INSERTION  06/20/2013   Procedure: INTRA-AORTIC BALLOON PUMP INSERTION;  Surgeon: Sinclair Grooms, MD;  Location: [facility] CATH LAB;  Service: Cardiovascular;;  . LEFT HEART CATHETERIZATION WITH CORONARY ANGIOGRAM N/A 06/20/2013   Procedure: LEFT HEART CATHETERIZATION WITH CORONARY ANGIOGRAM;  Surgeon: Sinclair Grooms, MD;  Location: [facility] CATH LAB;  Service: Cardiovascular;  Laterality: N/A;  . PERCUTANEOUS CORONARY STENT INTERVENTION (PCI-S)  06/20/2013   Procedure: PERCUTANEOUS CORONARY STENT INTERVENTION (PCI-S);  Surgeon: Sinclair Grooms, MD;  Location: [facility] CATH LAB;  Service: Cardiovascular;;     Family History  Problem Relation Age of Onset  . Emphysema Father   . Heart disease Mother   . Dementia Mother   . Diabetes Mother   . Pancreatic cancer Maternal Grandmother   . Lung cancer Maternal Grandmother   . Cancer Neg Hx      Social History   Social History  . Marital status: Single    Spouse name: N/A  . Number of children: 0  . Years of education: 12   Occupational History  . Unemployed     Social History Main Topics  . Smoking status: Former Smoker    Packs/day: 2.00    Years: 40.00    Types: Cigarettes    Quit date: 12/22/2014  . Smokeless tobacco: Never Used  . Alcohol use No     Comment: quit in 08/2013, he used to drink alcohol moderate (1 pine liquor one week)  for 30 years   . Drug use: No  . Sexual activity: Not Currently    Birth control/ protection: Condom     Comment: men    Other Topics Concern  . Not on file   Social History Narrative   Lives alone.    In a house trying to sell his home. Exercise: No.     BP 120/82   Pulse 77   Ht 5\' 8"  (1.727 m)   Wt 158 lb 9.6  oz (71.9 kg)   BMI 24.12 kg/m   Physical Exam:  Stable appearing middle-aged man, NAD HEENT: Unremarkable Neck:  No JVD, no thyromegally Lymphatics:  No adenopathy Back:  No CVA tenderness Lungs:  Clear, with no wheezes, rales, or rhonchi. HEART:  Regular rate rhythm, no murmurs, no rubs, no clicks Abd:  soft, positive bowel sounds, no organomegally, no rebound, no guarding, well-healed colostomy is in place Ext:  2 plus pulses, no edema, no cyanosis, no clubbing Skin:  No rashes no nodules Neuro:  CN II through XII intact, motor grossly intact   DEVICE  Normal device function.  See PaceArt for details.  Assess/Plan: 1. Chronic systolic heart failure - his symptoms remain class II. His fluid index is up a bit today and I've recommended that he reduce his salt intake. He admits to sodium indiscretion. 2. Coronary artery disease - he is still fairly active and denies anginal symptoms. He will continue his current medications. 3. ICD his Medtronic single-chamber ICD is working normally with over 9 years of battery longevity.  [name], M.D.

## 2016-10-05 ENCOUNTER — Telehealth: Payer: Self-pay | Admitting: Family Medicine

## 2016-10-05 LAB — CUP PACEART INCLINIC DEVICE CHECK
Date Time Interrogation Session: 20180906143740
HighPow Impedance: 62 Ohm
Implantable Lead Location: 753860
Implantable Pulse Generator Implant Date: 20150810
Lead Channel Pacing Threshold Pulse Width: 0.4 ms
Lead Channel Sensing Intrinsic Amplitude: 7 mV
MDC IDC LEAD IMPLANT DT: 20150810
MDC IDC MSMT BATTERY REMAINING LONGEVITY: 113 mo
MDC IDC MSMT BATTERY VOLTAGE: 3.01 V
MDC IDC MSMT LEADCHNL RV IMPEDANCE VALUE: 342 Ohm
MDC IDC MSMT LEADCHNL RV IMPEDANCE VALUE: 456 Ohm
MDC IDC MSMT LEADCHNL RV PACING THRESHOLD AMPLITUDE: 0.5 V
MDC IDC MSMT LEADCHNL RV SENSING INTR AMPL: 7.625 mV
MDC IDC SET LEADCHNL RV PACING AMPLITUDE: 2 V
MDC IDC SET LEADCHNL RV PACING PULSEWIDTH: 0.4 ms
MDC IDC SET LEADCHNL RV SENSING SENSITIVITY: 0.3 mV
MDC IDC STAT BRADY RV PERCENT PACED: 0.01 %

## 2016-10-05 NOTE — Telephone Encounter (Signed)
Pt came to the office yesterday and got a Flu shot and today he check his chart and it was not records that he had the flu shot, can you please update his records

## 2016-10-08 NOTE — Telephone Encounter (Signed)
Pt records have been updated

## 2016-10-08 NOTE — Progress Notes (Signed)
Pt came into office today for flu-shot. Flu shot was given by Doloris Hall

## 2016-10-18 DIAGNOSIS — Z95810 Presence of automatic (implantable) cardiac defibrillator: Secondary | ICD-10-CM | POA: Diagnosis not present

## 2016-10-18 DIAGNOSIS — Z955 Presence of coronary angioplasty implant and graft: Secondary | ICD-10-CM | POA: Diagnosis not present

## 2016-10-18 DIAGNOSIS — I5022 Chronic systolic (congestive) heart failure: Secondary | ICD-10-CM | POA: Diagnosis not present

## 2016-10-18 DIAGNOSIS — Z01810 Encounter for preprocedural cardiovascular examination: Secondary | ICD-10-CM | POA: Diagnosis not present

## 2016-10-18 DIAGNOSIS — M5136 Other intervertebral disc degeneration, lumbar region: Secondary | ICD-10-CM | POA: Diagnosis not present

## 2016-10-18 DIAGNOSIS — E785 Hyperlipidemia, unspecified: Secondary | ICD-10-CM | POA: Diagnosis not present

## 2016-10-18 DIAGNOSIS — R002 Palpitations: Secondary | ICD-10-CM | POA: Diagnosis not present

## 2016-10-18 DIAGNOSIS — I252 Old myocardial infarction: Secondary | ICD-10-CM | POA: Diagnosis not present

## 2016-10-18 DIAGNOSIS — I2510 Atherosclerotic heart disease of native coronary artery without angina pectoris: Secondary | ICD-10-CM | POA: Diagnosis not present

## 2016-10-18 DIAGNOSIS — C186 Malignant neoplasm of descending colon: Secondary | ICD-10-CM | POA: Diagnosis not present

## 2016-10-29 IMAGING — DX DG CHEST 2V
2 series · 2 of 2 positions shown · non-contrast
Comparison: 12/22/2014

CLINICAL DATA: Intermittent chest pain for 1 month, resolved
following nitroglycerin and aspirin, history defibrillator, CHF,
coronary artery disease post MI, COPD, chronic kidney disease,
former smoker

EXAM:
CHEST  2 VIEW

[w chest pa]
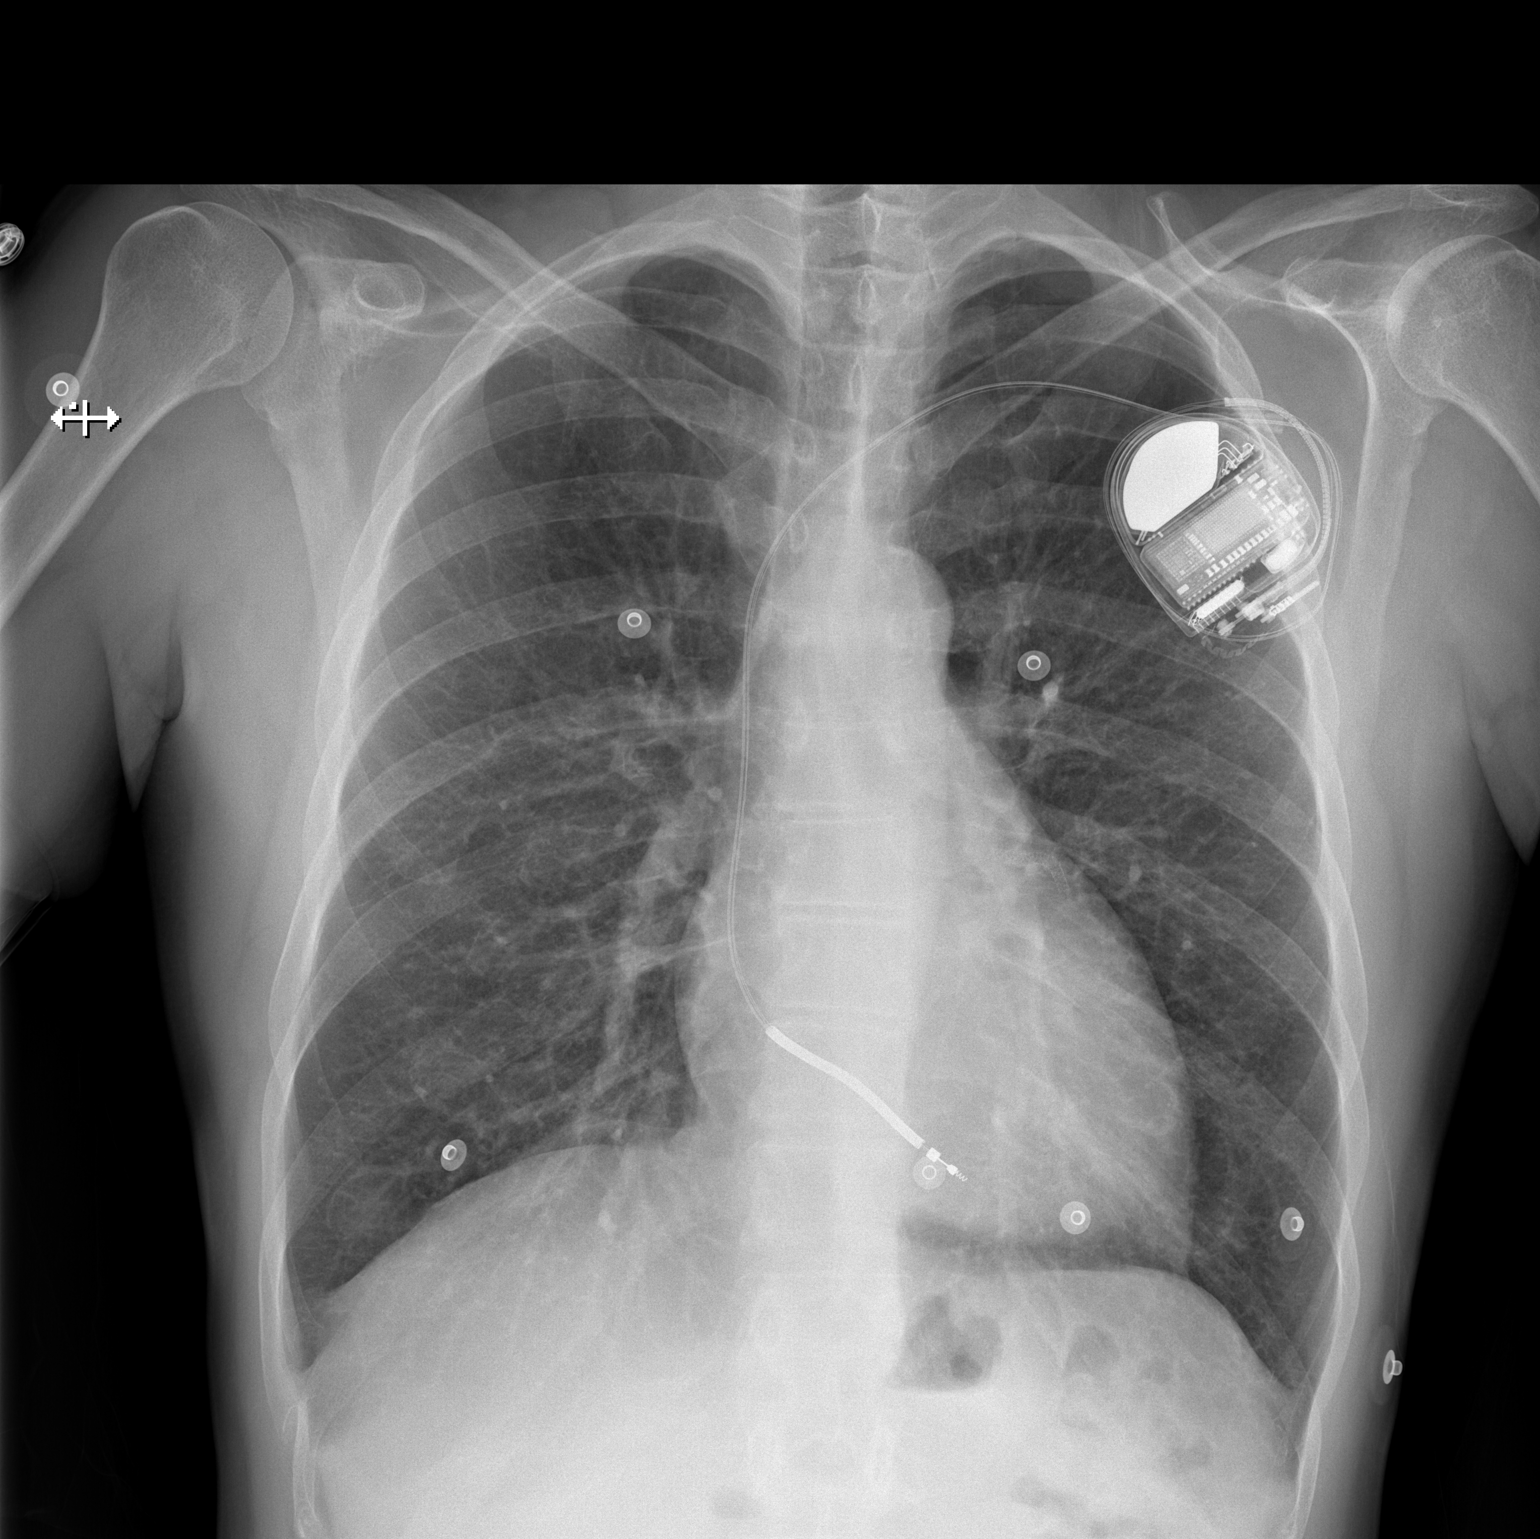

[w chest lat]
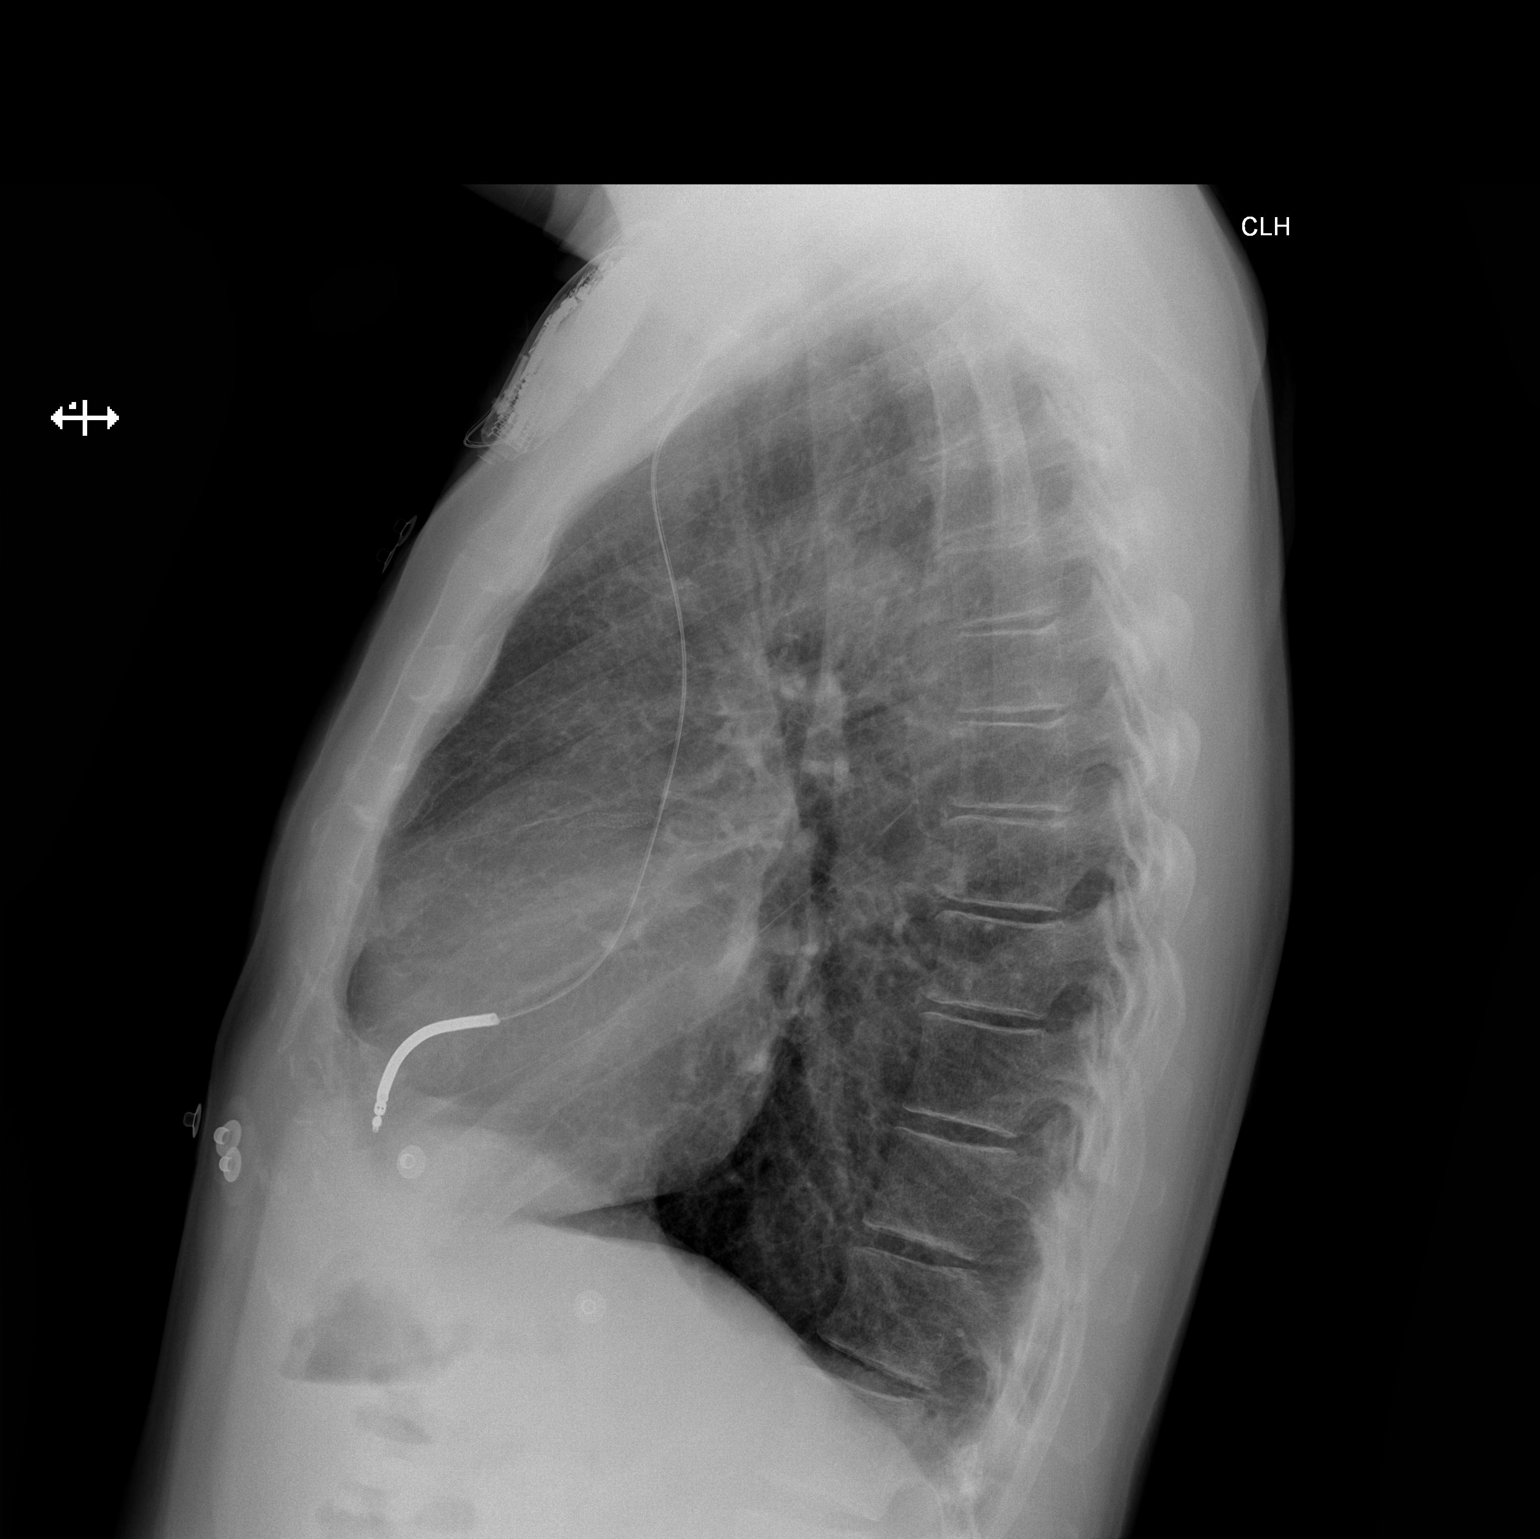

[2 of 2 positions shown; findings below may reference images not displayed]

FINDINGS: LEFT subclavian AICD lead tip projects at RIGHT ventricle.

Upper normal heart size.

Mediastinal contours and pulmonary vascularity normal.

Emphysematous and minimal bronchitic changes compatible with COPD.

No acute infiltrate, pleural effusion, or pneumothorax.

Osseous structures unremarkable.
IMPRESSION: COPD changes.

No acute abnormalities.

## 2016-11-13 ENCOUNTER — Ambulatory Visit: Payer: Self-pay | Admitting: Family Medicine

## 2016-11-29 ENCOUNTER — Telehealth: Payer: Self-pay

## 2016-11-29 MED ORDER — PREDNISONE 50 MG PO TABS: ORAL_TABLET | ORAL | 0 refills | Status: DC

## 2016-11-29 MED ORDER — DIPHENHYDRAMINE HCL 50 MG PO TABS: 50.0000 mg | ORAL_TABLET | Freq: Once | ORAL | 0 refills | Status: DC

## 2016-11-29 NOTE — Telephone Encounter (Signed)
Pt called to clarify he does have allergy to contrast media. He stated he got paralyzed. He stated that he had a CT with 50% contrast given and had no problems. He also stated he does not answer his phone d/t too many calls and to leave a voice message.  S/w CT who said they do not to reduced doses of contrast and he will need the 13hr prep. S/w Dr Burr Medico and she agrees that he needs 13 hr prep.  Called pt back and LVM with prep directions of prednisone and benadryl.

## 2016-11-29 NOTE — Telephone Encounter (Signed)
Central scheduling called that pt said he was not allergic to contrast and that they decrease it by 50%.   Called and LVM on pt phone for clarification of what he meant.

## 2016-11-29 NOTE — Telephone Encounter (Signed)
Pt stated he will pick up 13 hr prep. He stated he understood we would not be using contrast. He was asking if he needed to drink oral contrast. Called back and lvm that the prep is so that we can still use IV contrast and also  he does drink contrast 2 hr and 1 hr before CT.

## 2016-12-25 DIAGNOSIS — Z955 Presence of coronary angioplasty implant and graft: Secondary | ICD-10-CM | POA: Diagnosis not present

## 2016-12-25 DIAGNOSIS — I2510 Atherosclerotic heart disease of native coronary artery without angina pectoris: Secondary | ICD-10-CM | POA: Diagnosis not present

## 2016-12-25 DIAGNOSIS — I252 Old myocardial infarction: Secondary | ICD-10-CM | POA: Diagnosis not present

## 2016-12-25 DIAGNOSIS — C186 Malignant neoplasm of descending colon: Secondary | ICD-10-CM | POA: Diagnosis not present

## 2016-12-25 DIAGNOSIS — M5136 Other intervertebral disc degeneration, lumbar region: Secondary | ICD-10-CM | POA: Diagnosis not present

## 2016-12-25 DIAGNOSIS — E785 Hyperlipidemia, unspecified: Secondary | ICD-10-CM | POA: Diagnosis not present

## 2016-12-25 DIAGNOSIS — Z95810 Presence of automatic (implantable) cardiac defibrillator: Secondary | ICD-10-CM | POA: Diagnosis not present

## 2016-12-25 DIAGNOSIS — I5022 Chronic systolic (congestive) heart failure: Secondary | ICD-10-CM | POA: Diagnosis not present

## 2017-01-08 ENCOUNTER — Telehealth: Payer: Self-pay | Admitting: Hematology

## 2017-01-08 NOTE — Telephone Encounter (Signed)
R/s apt per Email request- patient rescheduled Ct - rescheduled lab appt and f/u - left message on patient vmail with appt date and time.

## 2017-01-09 ENCOUNTER — Other Ambulatory Visit: Payer: Self-pay

## 2017-01-09 ENCOUNTER — Ambulatory Visit (HOSPITAL_COMMUNITY): Admission: RE | Admit: 2017-01-09 | Payer: Medicare Other | Source: Ambulatory Visit

## 2017-01-11 ENCOUNTER — Telehealth: Payer: Self-pay | Admitting: Hematology

## 2017-01-11 ENCOUNTER — Ambulatory Visit: Payer: Self-pay | Admitting: Hematology

## 2017-01-11 ENCOUNTER — Other Ambulatory Visit: Payer: Self-pay

## 2017-01-11 DIAGNOSIS — C186 Malignant neoplasm of descending colon: Secondary | ICD-10-CM

## 2017-01-11 NOTE — Telephone Encounter (Signed)
Left message for patient regarding upcoming December appointment updates  °

## 2017-01-14 ENCOUNTER — Other Ambulatory Visit (HOSPITAL_BASED_OUTPATIENT_CLINIC_OR_DEPARTMENT_OTHER): Payer: Medicare Other

## 2017-01-14 ENCOUNTER — Encounter (HOSPITAL_COMMUNITY): Payer: Self-pay

## 2017-01-14 ENCOUNTER — Ambulatory Visit (HOSPITAL_COMMUNITY)
Admission: RE | Admit: 2017-01-14 | Discharge: 2017-01-14 | Disposition: A | Discharge status: Home / Self Care | Payer: Medicare Other | Source: Ambulatory Visit | Attending: Hematology | Admitting: Hematology

## 2017-01-14 DIAGNOSIS — J432 Centrilobular emphysema: Secondary | ICD-10-CM | POA: Insufficient documentation

## 2017-01-14 DIAGNOSIS — C186 Malignant neoplasm of descending colon: Secondary | ICD-10-CM

## 2017-01-14 DIAGNOSIS — D500 Iron deficiency anemia secondary to blood loss (chronic): Secondary | ICD-10-CM

## 2017-01-14 DIAGNOSIS — I700 Atherosclerosis of aorta: Secondary | ICD-10-CM | POA: Insufficient documentation

## 2017-01-14 DIAGNOSIS — J439 Emphysema, unspecified: Secondary | ICD-10-CM | POA: Diagnosis not present

## 2017-01-14 DIAGNOSIS — N281 Cyst of kidney, acquired: Secondary | ICD-10-CM | POA: Insufficient documentation

## 2017-01-14 DIAGNOSIS — K8020 Calculus of gallbladder without cholecystitis without obstruction: Secondary | ICD-10-CM | POA: Diagnosis not present

## 2017-01-14 DIAGNOSIS — C189 Malignant neoplasm of colon, unspecified: Secondary | ICD-10-CM

## 2017-01-14 DIAGNOSIS — Z933 Colostomy status: Secondary | ICD-10-CM | POA: Diagnosis not present

## 2017-01-14 DIAGNOSIS — I2510 Atherosclerotic heart disease of native coronary artery without angina pectoris: Secondary | ICD-10-CM | POA: Insufficient documentation

## 2017-01-14 LAB — IRON AND TIBC
%SAT: 19 % — AB (ref 20–55)
Iron: 59 ug/dL (ref 42–163)
TIBC: 310 ug/dL (ref 202–409)
UIBC: 251 ug/dL (ref 117–376)

## 2017-01-14 LAB — CBC WITH DIFFERENTIAL/PLATELET
BASO%: 0.2 % (ref 0.0–2.0)
BASOS ABS: 0 10*3/uL (ref 0.0–0.1)
EOS%: 0 % (ref 0.0–7.0)
Eosinophils Absolute: 0 10*3/uL (ref 0.0–0.5)
HEMATOCRIT: 44.2 % (ref 38.4–49.9)
HGB: 14.7 g/dL (ref 13.0–17.1)
LYMPH#: 0.8 10*3/uL — AB (ref 0.9–3.3)
LYMPH%: 8.4 % — AB (ref 14.0–49.0)
MCH: 31.3 pg (ref 27.2–33.4)
MCHC: 33.1 g/dL (ref 32.0–36.0)
MCV: 94.6 fL (ref 79.3–98.0)
MONO#: 0.1 10*3/uL (ref 0.1–0.9)
MONO%: 1.3 % (ref 0.0–14.0)
NEUT#: 9.1 10*3/uL — ABNORMAL HIGH (ref 1.5–6.5)
NEUT%: 90.1 % — AB (ref 39.0–75.0)
PLATELETS: 139 10*3/uL — AB (ref 140–400)
RBC: 4.68 10*6/uL (ref 4.20–5.82)
RDW: 14.6 % (ref 11.0–14.6)
WBC: 10.1 10*3/uL (ref 4.0–10.3)

## 2017-01-14 LAB — COMPREHENSIVE METABOLIC PANEL
ALT: 24 U/L (ref 0–55)
AST: 21 U/L (ref 5–34)
Albumin: 4.2 g/dL (ref 3.5–5.0)
Alkaline Phosphatase: 64 U/L (ref 40–150)
Anion Gap: 12 mEq/L — ABNORMAL HIGH (ref 3–11)
BILIRUBIN TOTAL: 0.32 mg/dL (ref 0.20–1.20)
BUN: 24.8 mg/dL (ref 7.0–26.0)
CO2: 22 meq/L (ref 22–29)
Calcium: 9.9 mg/dL (ref 8.4–10.4)
Chloride: 104 mEq/L (ref 98–109)
Creatinine: 1.4 mg/dL — ABNORMAL HIGH (ref 0.7–1.3)
EGFR: 56 mL/min/{1.73_m2} — AB (ref >60)
Glucose: 156 mg/dl — ABNORMAL HIGH (ref 70–140)
Potassium: 4.9 mEq/L (ref 3.5–5.1)
SODIUM: 139 meq/L (ref 136–145)
TOTAL PROTEIN: 7.7 g/dL (ref 6.4–8.3)

## 2017-01-14 LAB — FERRITIN: FERRITIN: 59 ng/mL (ref 22–316)

## 2017-01-14 LAB — CEA (IN HOUSE-CHCC): CEA (CHCC-In House): 4.38 ng/mL (ref 0.00–5.00)

## 2017-01-14 LAB — DRAW EXTRA CLOT TUBE

## 2017-01-14 MED ORDER — IOPAMIDOL (ISOVUE-300) INJECTION 61%
100.0000 mL | Freq: Once | INTRAVENOUS | Status: AC | PRN
  Administered 2017-01-14: 100 mL via INTRAVENOUS

## 2017-01-14 MED ORDER — IOPAMIDOL (ISOVUE-300) INJECTION 61%
INTRAVENOUS | Status: AC
  Filled 2017-01-14: qty 100

## 2017-01-14 NOTE — Progress Notes (Signed)
[place]  [phone] Fax:([phone]) [phone]  Clinic Follow-up Note   Patient Care Team: Patient, No Pcp Per as PCP - General (General Practice) [name], MD as Consulting Physician (Cardiology) [name], MD as Consulting Physician (Cardiology) [name], MD as Consulting Physician (Hematology) [name], MD as Consulting Physician (General Surgery)   Date of Service:  01/15/2017  CHIEF COMPLAINTS:  Follow up left side colon cancer, stage IIIB  Oncology History   Cancer Staging Adenocarcinoma of descending colon  pT3, pN1a, pMX s/p colectomy/ostomy 05/18/2016 Staging form: Colon and Rectum, AJCC 8th Edition - Pathologic stage from 05/18/2016: Stage IIIB (pT3, pN1a, cM0) - Signed by [name], MD on 07/01/2016       Adenocarcinoma of descending colon  pT3, pN1a, pMX s/p colectomy/ostomy 05/18/2016   05/09/2016 Imaging    CT chest, abdomen and pelvis showed probable descending colon carcinoma. No evidence of distant metastasis. 7 cm hypervascular focus in the upper liver, not typical for metastatic disease.      05/16/2016 Procedure    Colonoscopy showed a large mass which completely obstructss the distal descending colon. Biopsied. Diverticulosis in the left colon.      05/18/2016 Surgery    Left hemicolectomy by Dr. [name]      05/18/2016 Pathology Results    Left colon segmental resection shows invasive colorectal adenocarcinoma, 4.6 cm, tumor extends into subserosa connective tissue, margins are negative, metastatic carcinoma in 1 of 12 lymph nodes. Grade 2, lymphovascular invasion present, perineural invasion negative.      05/18/2016 Miscellaneous    MSI-stable       05/18/2016 Initial Diagnosis    Adenocarcinoma of descending colon  pT3, pN1a, pMX s/p colectomy/ostomy 05/18/2016      07/16/2016 - 08/22/2016 Chemotherapy    Xeloda twice a day for 2 weeks on and 1 week off.  stopped after 3 days due to severe diarrhea, and  changed to 1574m bid, started on 07/31/2016; Hold Xeloda starting 08/22/16 due to AKI. Will lower Xeloda to 3 in the morning and 2 in the evening when restart next cycle.   Stopped due to poor tolerance of sever diarrhea.        01/14/2017 Imaging    CT CAP W Contrast 01/14/17  IMPRESSION: 1. Stable exam. No new or progressive findings in the chest, abdomen, or pelvis. The tiny hypervascular focus in the dome of the liver seen on the previous study is not evident on today's exam. No evidence for metastatic disease on today's CT scan. 2.  Aortic Atherosclerois (ICD10-170.0) 3. Distal transverse end colostomy. 4.  Emphysema. (([id number])       HISTORY OF PRESENTING ILLNESS:  07/02/16 HLaurena Spies89y.o. male is here because of recent diagnosis of Invasive Colorectal Adenocarcinoma of the Colon. Initially Christopher was bleeding on and off and had a lot of gas and constipation. Christopher had a UKoreain December and that did not cover his bowel. Christopher also felt a line appearing on his stomach. These symptoms in October. Christopher also had tingling. Christopher went to ED 05/09/16 for syncope. There Christopher had a CT AP scan that showed evidence of a mass in his colon. Followed by a live Colonoscopy and biopsy on 4/18 done by Dr. HMallie Musselshowing evidence the mass was cancer. Christopher had a colectomy surgery 05/18/16 and the results showed to be Invasive colorectal Adenocarcinoma of the Colon. During his colonoscopy process Christopher could not complete pre treatment  Because the cancer  cut it off the path to his bowel and caused him to throw up with blood, so Christopher did a live colonoscopy. PCP is Dr. [name]. Christopher had gotten staph epidermitis in his incision while in the hospital his incision is half healed now.   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 Christopher had 2 heart attacks and 2 stents placed and CHF.   Christopher presents to the clinic today post colectomy. Christopher lives by himself and Christopher feels Ok now but Christopher feels like Christopher is not prepared for chemo and the sickness that comes  with it. Christopher was told to change his dressing every 3 days and Christopher now changes it everyday. Christopher had painful swallowing after surgery which has resolved and some ulceration in his mouth seen by his dentist. Christopher is taking ferrous sulfate. Christopher has a lot of stress because Christopher is not sure what was really going on. Christopher is taking baby aspirin. His appetite and energy level is back to normal. Christopher lost weight after surgery. Christopher lost 20 pounds since surgery. Christopher has no feet swelling and Christopher pain on his chest around his pace maker which was put in August 2015. Christopher gets regularly checked for HIV. Christopher wants to know why Christopher cannot use target therapy. Christopher does not want to do IV Chemotherapy. His regular cardiologist keeps him on his regular heart medication and after the surgery Christopher got nitro. Christopher is going to see his cardiologist on the 19th.    CURRENT THERAPY:  Surveillance   INTERVAL HISTORY:  Christopher [name] is here for a follow-up. Christopher presents to the clinic today noting Christopher had a CT scan yesterday. Christopher notes Christopher will get soreness in his abdominal occasionally but has regular BMs, no bloating. Christopher notes to eating well, Christopher has gained his weight back. Christopher will see Dr. [name] again soon about colostomy reversal.      MEDICAL HISTORY:  Past Medical History:  Diagnosis Date  . Automatic implantable cardioverter-defibrillator in situ    MDT Aug 2015 Dr. Lovena Le  . CAD (coronary artery disease), native coronary artery 07/03/2013   Cath 06/20/13  Normal left main, occluded LAD, occluded RCA, 50% circ EF 15%  3.0 x28 and 3.0 x 8 mm Xience stent Dr. Tamala Julian  To LAD   . Cancer ([place])   . Chronic kidney disease   . Chronic systolic CHF (congestive heart failure) (HCC)    ECHO 08/05/13  EF 30%  Anterior akinesis and inferior hypokinesis   . COPD (chronic obstructive pulmonary disease) ([place])   . Hyperlipidemia   . Mass of colon 04/2016  . Myocardial infarction ([place]) 04/29/2013  . Thrombocytopenia ([place]) 12/27/2014   Chronic      SURGICAL  HISTORY: Past Surgical History:  Procedure Laterality Date  . CARDIAC CATHETERIZATION  05/2013   . CARDIAC CATHETERIZATION N/A 12/28/2014   Procedure: Left Heart Cath and Coronary Angiography;  Surgeon: [name], MD;  Location: [facility];  Service: Cardiovascular;  Laterality: N/A;  . CARDIAC CATHETERIZATION N/A 12/28/2014   Procedure: Coronary Stent Intervention;  Surgeon: [name], MD;  Location: [facility];  Service: Cardiovascular;  Laterality: N/A;  . COLECTOMY WITH COLOSTOMY CREATION/HARTMANN PROCEDURE Left 05/18/2016   Procedure: COLECTOMY WITH OSTOMY CREATION/HARTMANN PROCEDURE;  Surgeon: [name], MD;  Location: [facility];  Service: General;  Laterality: Left;  . CORONARY ANGIOPLASTY  05/2013   . FLEXIBLE SIGMOIDOSCOPY N/A 05/16/2016   Procedure: FLEXIBLE SIGMOIDOSCOPY;  Surgeon: [name], MD;  Location: [facility];  Service: Gastroenterology;  Laterality: N/A;  . ICD placement  09/07/2013   . IMPLANTABLE CARDIOVERTER DEFIBRILLATOR IMPLANT N/A 09/07/2013   Procedure: IMPLANTABLE CARDIOVERTER DEFIBRILLATOR IMPLANT;  Surgeon: [name], MD;  Location: [facility];  Service: Cardiovascular;  Laterality: N/A;  . INTRA-AORTIC BALLOON PUMP INSERTION  06/20/2013   Procedure: INTRA-AORTIC BALLOON PUMP INSERTION;  Surgeon: Sinclair Grooms, MD;  Location: [facility] CATH LAB;  Service: Cardiovascular;;  . LEFT HEART CATHETERIZATION WITH CORONARY ANGIOGRAM N/A 06/20/2013   Procedure: LEFT HEART CATHETERIZATION WITH CORONARY ANGIOGRAM;  Surgeon: Sinclair Grooms, MD;  Location: [facility] CATH LAB;  Service: Cardiovascular;  Laterality: N/A;  . PERCUTANEOUS CORONARY STENT INTERVENTION (PCI-S)  06/20/2013   Procedure: PERCUTANEOUS CORONARY STENT INTERVENTION (PCI-S);  Surgeon: Sinclair Grooms, MD;  Location: [facility] CATH LAB;  Service: Cardiovascular;;    SOCIAL HISTORY: Social History   Socioeconomic History  . Marital status: Single    Spouse name: Not on file  .  Number of children: 0  . Years of education: 88  . Highest education level: Not on file  Social Needs  . Financial resource strain: Not on file  . Food insecurity - worry: Not on file  . Food insecurity - inability: Not on file  . Transportation needs - medical: Not on file  . Transportation needs - non-medical: Not on file  Occupational History  . Occupation: Unemployed   Tobacco Use  . Smoking status: Former Smoker    Packs/day: 2.00    Years: 40.00    Pack years: 80.00    Types: Cigarettes    Last attempt to quit: 12/22/2014    Years since quitting: 2.0  . Smokeless tobacco: Never Used  Substance and Sexual Activity  . Alcohol use: No    Comment: quit in 08/2013, Christopher used to drink alcohol moderate (1 pine liquor one week)  for 30 years   . Drug use: No  . Sexual activity: Not Currently    Birth control/protection: Condom    Comment: men   Other Topics Concern  . Not on file  Social History Narrative   Lives alone.    In a house trying to sell his home. Exercise: No.    FAMILY HISTORY: Family History  Problem Relation Age of Onset  . Emphysema Father   . Heart disease Mother   . Dementia Mother   . Diabetes Mother   . Pancreatic cancer Maternal Grandmother   . Lung cancer Maternal Grandmother   . Cancer Neg Hx     ALLERGIES:  is allergic to capecitabine; celebrex [celecoxib]; and contrast media [iodinated diagnostic agents].  MEDICATIONS:  Current Outpatient Medications  Medication Sig Dispense Refill  . aspirin 81 MG chewable tablet Chew 1 tablet (81 mg total) by mouth daily. 30 tablet 5  . atorvastatin (LIPITOR) 80 MG tablet Take 1 tablet (80 mg total) by mouth daily at 6 PM. 30 tablet 5  . carvedilol (COREG) 3.125 MG tablet Take 1 tablet (3.125 mg total) by mouth 2 (two) times daily with a meal. 60 tablet 0  . clopidogrel (PLAVIX) 75 MG tablet Take 75 mg by mouth daily.    . digoxin (LANOXIN) 0.125 MG tablet Take 1 tablet (0.125 mg total) by mouth daily. 30  tablet 5  . feeding supplement, ENSURE ENLIVE, (ENSURE ENLIVE) LIQD Take 237 mLs by mouth 3 (three) times daily between meals. 237 mL 12  . ferrous  sulfate 325 (65 FE) MG tablet Take 1 tablet (325 mg total) by mouth 2 (two) times daily with a meal. 60 tablet 3  . lisinopril (PRINIVIL,ZESTRIL) 2.5 MG tablet Take 1 tablet (2.5 mg total) by mouth daily. 30 tablet 5  . Multiple Vitamin (MULTIVITAMIN WITH MINERALS) TABS tablet Take 1 tablet by mouth daily. 30 tablet 0  . saccharomyces boulardii (FLORASTOR) 250 MG capsule Take 1 capsule (250 mg total) by mouth 2 (two) times daily. (Patient taking differently: Take 500 mg by mouth daily. ) 30 capsule 0  . nitroGLYCERIN (NITROSTAT) 0.4 MG SL tablet Place 1 tablet (0.4 mg total) under the tongue every 5 (five) minutes as needed for chest pain. (Patient not taking: Reported on 01/15/2017) 20 tablet 0  . ondansetron (ZOFRAN) 4 MG tablet Take 1 tablet (4 mg total) by mouth every 6 (six) hours. (Patient not taking: Reported on 01/15/2017) 12 tablet 0   No current facility-administered medications for this visit.     REVIEW OF SYSTEMS:   Constitutional: Denies fevers, chills or abnormal night sweats  (+) good appetite (+) weight gain  Eyes: Denies blurriness of vision, double vision or watery eyes Ears, nose, mouth, throat, and face: Denies mucositis or sore throat  Respiratory: Denies cough, dyspnea or wheezes Cardiovascular: Denies palpitation, chest discomfort or lower extremity swelling Gastrointestinal:  Denies nausea, heartburn  (+) occasional abdominal soreness post surgery  Skin: Denies abnormal skin rashes Lymphatics: Denies new lymphadenopathy or easy bruising Neurological:Denies numbness, tingling or new weaknesses Behavioral/Psych: Mood is stable, no new changes  All other systems were reviewed with the patient and are negative.   PHYSICAL EXAMINATION: ECOG PERFORMANCE STATUS: 0 - Asymptomatic  Vitals:   01/15/17 1507  BP: 103/67    Pulse: 83  Resp: 18  Temp: 97.8 F (36.6 C)  SpO2: 98%   Filed Weights   01/15/17 1507  Weight: 176 lb 1.6 oz (79.9 kg)    GENERAL:alert, no distress and comfortable SKIN: skin color, texture, turgor are normal, no rashes or significant lesions EYES: normal, conjunctiva are pink and non-injected, sclera clear OROPHARYNX:no exudate, no erythema and lips, buccal mucosa, and tongue normal  NECK: supple, thyroid normal size, non-tender, without nodularity LYMPH:  no palpable lymphadenopathy in the cervical, axillary or inguinal LUNGS: clear to auscultation and percussion with normal breathing effort HEART: regular rate & rhythm and no murmurs and no lower extremity edema ABDOMEN:abdomen soft, non-tender and normal bowel sounds (+) incision is pretty clean, with a large scar at midline immediately above umbilicus, healed very well   Musculoskeletal:no cyanosis of digits and no clubbing  PSYCH: alert & oriented x 3 with fluent speech NEURO: no focal motor/sensory deficits  LABORATORY DATA:  I have reviewed the data as listed CBC Latest Ref Rng & Units 01/14/2017 09/12/2016 08/29/2016  WBC 4.0 - 10.3 10e3/uL 10.1 7.2 7.4  Hemoglobin 13.0 - 17.1 g/dL 14.7 11.4(L) 12.2(L)  Hematocrit 38.4 - 49.9 % 44.2 34.5(L) 36.9(L)  Platelets 140 - 400 10e3/uL 139(L) 169 123(L)    CMP Latest Ref Rng & Units 01/14/2017 09/12/2016 08/29/2016  Glucose 70 - 140 mg/dl 156(H) 107 122  BUN 7.0 - 26.0 mg/dL 24.8 16.3 22.7  Creatinine 0.7 - 1.3 mg/dL 1.4(H) 1.2 1.7(H)  Sodium 136 - 145 mEq/L 139 140 140  Potassium 3.5 - 5.1 mEq/L 4.9 4.5 4.8  Chloride 101 - 111 mmol/L - - -  CO2 22 - 29 mEq/L 22 25 30(H)  Calcium 8.4 - 10.4 mg/dL 9.9 9.4  9.9  Total Protein 6.4 - 8.3 g/dL 7.7 6.4 6.4  Total Bilirubin 0.20 - 1.20 mg/dL 0.32 0.25 0.32  Alkaline Phos 40 - 150 U/L 64 56 62  AST 5 - 34 U/L _0 ALT 0 - 55 U/L _1 PATHOLOGY  Diagnosis 05/18/16 Colon, segmental resection for tumor, Left - INVASIVE  COLORECTAL ADENOCARCINOMA, 4.6 CM. - TUMOR EXTENDS INTO SUBSEROSAL CONNECTIVE TISSUE. - MARGINS NOT INVOLVED. - METASTATIC CARCINOMA IN ONE OF TWELVE LYMPH NODES (1/12). Microscopic Comment COLON AND RECTUM (INCLUDING TRANS-ANAL RESECTION): Specimen: Left colon. Procedure: Segmental resection. Tumor site: Descending colon. Specimen integrity: Intact. Macroscopic intactness of mesorectum: Not applicable. Macroscopic tumor perforation: No Invasive tumor: Maximum size: 4.6 cm Histologic type(s): Colorectal adenocarcinoma Histologic grade and differentiation: G2: moderately differentiated/low grade Type of polyp in which invasive carcinoma arose: No residual polyp Microscopic extension of invasive tumor: Through muscularis propria into subserosal connective tissue. Lymph-Vascular invasion: Present. Peri-neural invasion: No Tumor deposit(s) (discontinuous extramural extension): No Resection margins: Proximal margin: Free of tumor Distal margin: Free of tumor Circumferential (radial) (posterior ascending, posterior descending; lateral and posterior mid-rectum; and entire lower 1/3 rectum): Free of tumor Mesenteric margin (sigmoid and transverse): N/A Distance closest margin (if all above margins negative): 0.5 cm from radial margin Treatment effect (neo-adjuvant therapy): No Microscopic Comment(continued) Additional polyp(s): No Non-neoplastic findings: N/A Lymph nodes: number examined 12; number positive: 1 Pathologic Staging: pT3, pN1a, pMX Ancillary studies: MMR by IHC and MSI by PCR   Diagnosis 05/16/16 Colon, biopsy, Left Descending - ADENOCARCINOMA.  PROCEDURES  ECHO 05/17/16 Study Conclusions - Left ventricle: The cavity size was mildly dilated. Wall   thickness was normal. Systolic function was severely reduced. The   estimated ejection fraction was in the range of 25% to 30%.   Akinesis of the anteroseptal, anterior, and apical myocardium.   Doppler parameters are  consistent with abnormal left ventricular   relaxation (grade 1 diastolic dysfunction).   Colonoscopy 05/16/16 IMPRESSION - Likely malignant completely obstructing tumor in the distal descending colon. Biopsied. - Diverticulosis in the left colon.   RADIOGRAPHIC STUDIES: I have personally reviewed the radiological images as listed and agreed with the findings in the report. Ct Chest W Contrast  Result Date: 01/14/2017 CLINICAL DATA:  Adenocarcinoma of descending colon diagnosed April 2018. Status post resection. EXAM: CT CHEST, ABDOMEN, AND PELVIS WITH CONTRAST TECHNIQUE: Multidetector CT imaging of the chest, abdomen and pelvis was performed following the standard protocol during bolus administration of intravenous contrast. CONTRAST:  138m ISOVUE-300 IOPAMIDOL (ISOVUE-300) INJECTION 61% COMPARISON:  Chest CT 05/17/2016.  Abdomen and pelvis CT 05/09/2016. FINDINGS: CT CHEST FINDINGS Cardiovascular: The heart size is normal. No pericardial effusion. Coronary artery calcification is evident. Left-sided permanent pacemaker noted. Mediastinum/Nodes: No mediastinal lymphadenopathy. There is no hilar lymphadenopathy. The esophagus has normal imaging features. There is no axillary lymphadenopathy. Lungs/Pleura: Centrilobular emphysema noted in the upper lobes. No focal airspace consolidation. No pulmonary edema or pleural effusion. No suspicious pulmonary nodule or mass. Musculoskeletal: Bone windows reveal no worrisome lytic or sclerotic osseous lesions. CT ABDOMEN PELVIS FINDINGS Hepatobiliary: No focal abnormality within the liver parenchyma. Hypervascular lesions seen in the dome of the liver on the prior study not evident today. There is no evidence for gallstones, gallbladder wall thickening, or pericholecystic fluid. No intrahepatic or extrahepatic biliary dilation. Pancreas: No focal mass lesion. No dilatation of the main duct. No intraparenchymal cyst. No peripancreatic edema. Spleen: No  splenomegaly. No focal mass lesion. Adrenals/Urinary Tract:  No adrenal nodule or mass. Bilateral renal cysts again identified measuring up to 4.0 cm in the left kidney. Areas of cortical scarring noted in left kidney. No evidence for hydroureter. The urinary bladder appears normal for the degree of distention. Stomach/Bowel: Stomach is nondistended. No gastric wall thickening. No evidence of outlet obstruction. Duodenum is normally positioned as is the ligament of Treitz. No small bowel wall thickening. No small bowel dilatation. The terminal ileum is normal. The appendix is normal. Distal transverse and colostomy noted left abdomen. Hartmann's pouch associated. Vascular/Lymphatic: There is abdominal aortic atherosclerosis without aneurysm. There is no gastrohepatic or hepatoduodenal ligament lymphadenopathy. No intraperitoneal or retroperitoneal lymphadenopathy. No pelvic sidewall lymphadenopathy. Reproductive: The prostate gland and seminal vesicles have normal imaging features. Other: No intraperitoneal free fluid. Musculoskeletal: Bone windows reveal no worrisome lytic or sclerotic osseous lesions. IMPRESSION: 1. Stable exam. No new or progressive findings in the chest, abdomen, or pelvis. The tiny hypervascular focus in the dome of the liver seen on the previous study is not evident on today's exam. No evidence for metastatic disease on today's CT scan. 2.  Aortic Atherosclerois (ICD10-170.0) 3. Distal transverse end colostomy. 4.  Emphysema. ([id number]) Electronically Signed   By: Misty Stanley M.D.   On: 01/14/2017 16:07   Ct Abdomen Pelvis W Contrast  Result Date: 01/14/2017 CLINICAL DATA:  Adenocarcinoma of descending colon diagnosed April 2018. Status post resection. EXAM: CT CHEST, ABDOMEN, AND PELVIS WITH CONTRAST TECHNIQUE: Multidetector CT imaging of the chest, abdomen and pelvis was performed following the standard protocol during bolus administration of intravenous contrast. CONTRAST:  168m  ISOVUE-300 IOPAMIDOL (ISOVUE-300) INJECTION 61% COMPARISON:  Chest CT 05/17/2016.  Abdomen and pelvis CT 05/09/2016. FINDINGS: CT CHEST FINDINGS Cardiovascular: The heart size is normal. No pericardial effusion. Coronary artery calcification is evident. Left-sided permanent pacemaker noted. Mediastinum/Nodes: No mediastinal lymphadenopathy. There is no hilar lymphadenopathy. The esophagus has normal imaging features. There is no axillary lymphadenopathy. Lungs/Pleura: Centrilobular emphysema noted in the upper lobes. No focal airspace consolidation. No pulmonary edema or pleural effusion. No suspicious pulmonary nodule or mass. Musculoskeletal: Bone windows reveal no worrisome lytic or sclerotic osseous lesions. CT ABDOMEN PELVIS FINDINGS Hepatobiliary: No focal abnormality within the liver parenchyma. Hypervascular lesions seen in the dome of the liver on the prior study not evident today. There is no evidence for gallstones, gallbladder wall thickening, or pericholecystic fluid. No intrahepatic or extrahepatic biliary dilation. Pancreas: No focal mass lesion. No dilatation of the main duct. No intraparenchymal cyst. No peripancreatic edema. Spleen: No splenomegaly. No focal mass lesion. Adrenals/Urinary Tract: No adrenal nodule or mass. Bilateral renal cysts again identified measuring up to 4.0 cm in the left kidney. Areas of cortical scarring noted in left kidney. No evidence for hydroureter. The urinary bladder appears normal for the degree of distention. Stomach/Bowel: Stomach is nondistended. No gastric wall thickening. No evidence of outlet obstruction. Duodenum is normally positioned as is the ligament of Treitz. No small bowel wall thickening. No small bowel dilatation. The terminal ileum is normal. The appendix is normal. Distal transverse and colostomy noted left abdomen. Hartmann's pouch associated. Vascular/Lymphatic: There is abdominal aortic atherosclerosis without aneurysm. There is no gastrohepatic  or hepatoduodenal ligament lymphadenopathy. No intraperitoneal or retroperitoneal lymphadenopathy. No pelvic sidewall lymphadenopathy. Reproductive: The prostate gland and seminal vesicles have normal imaging features. Other: No intraperitoneal free fluid. Musculoskeletal: Bone windows reveal no worrisome lytic or sclerotic osseous lesions. IMPRESSION: 1. Stable exam. No new or progressive findings in the chest, abdomen, or pelvis.  The tiny hypervascular focus in the dome of the liver seen on the previous study is not evident on today's exam. No evidence for metastatic disease on today's CT scan. 2.  Aortic Atherosclerois (ICD10-170.0) 3. Distal transverse end colostomy. 4.  Emphysema. ([id number]) Electronically Signed   By: Misty Stanley M.D.   On: 01/14/2017 16:07     CT Chest W Contrast 05/17/16 IMPRESSION: Negative for metastatic or acute abnormality in the chest  CT AP W Contrast 05/09/16 IMPRESSION: 1. 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 There is masslike thickening in this region and extensive proximal stool retention. Mesocolic adenopathy, presumed metastatic node. 2. Patient experienced mild contrast reaction as described above. Christopher return to baseline without treatment. Patient is being transferred to ER for evaluation of faintness that was present prior to contrast. 3.  Aortic Atherosclerosis (ICD10-170.0), age advanced 100. Remote left ventricular infarct with dilatation. 5. Subcentimeter hypervascular focus in the upper liver, not typical for metastatic disease. Attention on follow-up   ASSESSMENT & PLAN:  EFOSA TREICHLER is a 56 y.o. male who has a history of CAD and CHF, Chronic Kidney Disease, and HLD. Christopher is here for treatment for his colon cancer.   1. Invasive Colorectal Adenocarcinoma of the Left Colon, pT3N1aM0,  Stage IIIB, MSI-stable  -I reviewed his surgical pathology finding and scan findings with patient in great details. -Christopher had a complete surgical resection, 1  positive lymph nodes, locally advanced. Scan showed no evidence of distant metastasis, except a indeterminate tiny liver lesion.   -We discussed the risk of cancer recurrence, which is high due to his stage III disease. I suggest adjuvant chemotherapy due to the high risk of his cancer, which is the standard of care for stage III cancer.  -I previously discussed the standard adjuvant chemotherapy regimen FOLFOX or CAPOX. However patient lives alone, had extensive coronary artery disease and heart failure, is not willing to consider intravenous chemotherapy. -I previously recommend him to consider single agent capecitabine as adjuvant therapy for 3-6 months. -Christopher started Xeloda on 6/19, but developed severe diarrhea and weight loss, and stopped after 3 days. -Christopher did try dose reduction on Xeloda, still has significant diarrhea with dehydration and a KI, and we stopped it. -Christopher has recovered well from chemotherapy.  -Okay to reverse his colostomy. However patient still has some financial issues, may want to wait until next year. -I discussed the risk of cancer recurrence in the future. I discussed the surveillance plan, which is a physical exam and lab test (including CBC, CMP and CEA) every 3-4 months for the first 2 years, then every 6-12 months, colonoscopy in one year, and surveillance CT scan every 6-12 month for up to 5 year.  -We discussed his CT CAP from 01/14/17 shows no evidence of recurrence. Previous liver lesion was not seen on this scan.  -Labs reviewed from yesterday, BUN shows 1.4, Glucose at 156, ANC at 9.1, anemia resolved and CEA normal at 4.38.  -I discussed Christopher is fine to proceed with colostomy reversal. Christopher will discuss this further with Dr. [name] in 01/2017. -It has been almost 1 year since diagnosis. Will follow up every 4 months for another year then every 6 months. I discussed his first 2 years is at higher risk of recurrence. Repeat scan in one year if Christopher is clinically doing well    -Due for colonoscopy in 04/2016, will send note to Dr. Pincus Sanes and Dr. [name]   2. CAD and systolic CHF with EF 06-26% -Has  2 stents placed and a pace maker -Christopher will follow-up with his cardiologist -will watch his fluids balance to avoid overload  -Recent scan shows aortic calcifications. I recommend Christopher follow up with cardiologist or PCP to watch his cholesterol.   3. COPD -recently stopped smoking and was smoking for 40 years 2 packs a day -Christopher has quit smoking    4. Family Support -Christopher has low family support from siblings. They will check in but they do not help him much -Christopher is put on disability  -Christopher feels overall more stressed  5. Financial support -Christopher is on medicaid and disability -Christopher does not get paid until the 27th of this month  -Christopher received financial assistance for his Xeloda, no copay  - Christopher is on [employer]. Christopher shares concern with paying his medical bills with [facility].   -I can refer to social work, if Christopher requests.   6. Diarrhea -secondary to Xeloda and caused dehydration  -resolved now    7. AKI  -elevated Cr 08/22/16 secondary to diarrhea -Mild at 1.4 on 01/14/17   PLAN: --lab and scan review, NED, will continue surveillance -Send a message to Dr. Pincus Sanes about 04/2016 colonoscopy  -will see Dr. [name] in 01/2017 about colostomy reversion  -Lab and f/u in 4 months    No orders of the defined types were placed in this encounter.   All questions were answered. The patient knows to call the clinic with any problems, questions or concerns.  I spent 20 minutes counseling the patient face to face. The total time spent in the appointment was 25 minutes and more than 50% was on counseling.  This document serves as a record of services personally performed by [name], MD. It was created on her behalf by Joslyn Devon, a trained medical scribe. The creation of this record is based on the scribe's personal observations and the provider's statements to them.    I  have reviewed the above documentation for accuracy and completeness, and I agree with the above.     [name], MD 01/15/2017

## 2017-01-15 ENCOUNTER — Ambulatory Visit (HOSPITAL_BASED_OUTPATIENT_CLINIC_OR_DEPARTMENT_OTHER): Payer: Medicare Other | Admitting: Hematology

## 2017-01-15 VITALS — BP 103/67 | HR 83 | Temp 97.8°F | Resp 18 | Wt 176.1 lb

## 2017-01-15 DIAGNOSIS — C186 Malignant neoplasm of descending colon: Secondary | ICD-10-CM

## 2017-01-16 ENCOUNTER — Telehealth: Payer: Self-pay | Admitting: Hematology

## 2017-01-16 ENCOUNTER — Encounter: Payer: Self-pay | Admitting: Hematology

## 2017-01-16 ENCOUNTER — Ambulatory Visit: Payer: Self-pay | Admitting: Hematology

## 2017-01-16 NOTE — Telephone Encounter (Signed)
Left message for patient regarding upcoming April 2019 appointments.

## 2017-01-18 ENCOUNTER — Telehealth: Payer: Self-pay | Admitting: Gastroenterology

## 2017-01-18 NOTE — Telephone Encounter (Signed)
-----   Message from [name], MD sent at 01/16/2017 11:30 PM EST ----- Dr. Loletha Carrow,  Please check if he needs colonoscopy earlier than April 2019. Not sure if he had incomplete colonoscopy when he was diagnosed with colon cancer.  Thanks  Krista Blue

## 2017-01-18 NOTE — Telephone Encounter (Signed)
Left message to return call 

## 2017-01-18 NOTE — Telephone Encounter (Signed)
Pt has been scheduled for 02-26-2017 @ 130 pm with Dr. Wilfrid Lund.

## 2017-01-18 NOTE — Telephone Encounter (Signed)
Please have this patient see me in clinic sometime in January because we need to plan a colonoscopy for him.  He is a complicated cardiac patient, and needs to have his colonoscopy prior to a colostomy takedown.

## 2017-02-18 DIAGNOSIS — Z95810 Presence of automatic (implantable) cardiac defibrillator: Secondary | ICD-10-CM | POA: Diagnosis not present

## 2017-02-18 DIAGNOSIS — E7849 Other hyperlipidemia: Secondary | ICD-10-CM | POA: Diagnosis not present

## 2017-02-18 DIAGNOSIS — I252 Old myocardial infarction: Secondary | ICD-10-CM | POA: Diagnosis not present

## 2017-02-18 DIAGNOSIS — I5022 Chronic systolic (congestive) heart failure: Secondary | ICD-10-CM | POA: Diagnosis not present

## 2017-02-18 DIAGNOSIS — C186 Malignant neoplasm of descending colon: Secondary | ICD-10-CM | POA: Diagnosis not present

## 2017-02-18 DIAGNOSIS — M5136 Other intervertebral disc degeneration, lumbar region: Secondary | ICD-10-CM | POA: Diagnosis not present

## 2017-02-18 DIAGNOSIS — Z955 Presence of coronary angioplasty implant and graft: Secondary | ICD-10-CM | POA: Diagnosis not present

## 2017-02-18 DIAGNOSIS — I2510 Atherosclerotic heart disease of native coronary artery without angina pectoris: Secondary | ICD-10-CM | POA: Diagnosis not present

## 2017-02-26 ENCOUNTER — Telehealth: Payer: Self-pay | Admitting: Gastroenterology

## 2017-02-26 ENCOUNTER — Encounter: Payer: Self-pay | Admitting: Gastroenterology

## 2017-02-26 ENCOUNTER — Other Ambulatory Visit: Payer: Self-pay

## 2017-02-26 ENCOUNTER — Ambulatory Visit (INDEPENDENT_AMBULATORY_CARE_PROVIDER_SITE_OTHER): Payer: Medicare Other | Admitting: Gastroenterology

## 2017-02-26 ENCOUNTER — Telehealth: Payer: Self-pay

## 2017-02-26 VITALS — BP 114/78 | HR 74 | Ht 68.0 in | Wt 181.0 lb

## 2017-02-26 DIAGNOSIS — I2589 Other forms of chronic ischemic heart disease: Secondary | ICD-10-CM | POA: Diagnosis not present

## 2017-02-26 DIAGNOSIS — Z85038 Personal history of other malignant neoplasm of large intestine: Secondary | ICD-10-CM | POA: Diagnosis not present

## 2017-02-26 DIAGNOSIS — I255 Ischemic cardiomyopathy: Secondary | ICD-10-CM

## 2017-02-26 DIAGNOSIS — Z7902 Long term (current) use of antithrombotics/antiplatelets: Secondary | ICD-10-CM

## 2017-02-26 NOTE — Telephone Encounter (Signed)
Christopher [name] is a patient of  Dr. Wynonia Lawman. Please direct question to him. Per records, patient takes ASA and Plavix.   Thanks  [employer], PAC

## 2017-02-26 NOTE — Telephone Encounter (Signed)
RE: Christopher [name] DOB: 1960-02-16 MRN: [id number]   Dear Lovena Le,    We have scheduled the above patient for an endoscopic procedure. Our records show that he is on anticoagulation therapy.   Please advise as to how long the patient may come off his therapy of Plavix prior to the procedure, which is scheduled for 03/12/17.  Please fax back/ or route the completed form to [facility] at [phone].   Sincerely,   Caryl Pina, LPN

## 2017-02-26 NOTE — Progress Notes (Signed)
[facility] Progress Note  Chief Complaint: History of colon cancer  Subjective  History:   This is a 57 year old man I saw April 2018 for obstructing colon cancer.  An incomplete colonoscopy was done at the hospital endoscopy lab due to the severity of his cardiac condition.  He then underwent resection and colostomy placement by Dr. [name]. His tumor was MSI stable, stage IIIb (I reviewed the December 2018 oncology note by Dr.Feng).  He received Xeloda in July 2018 but it was stopped due to severe diarrhea.  He has declined systemic chemotherapy. He is here today to discuss repeat colonoscopy prior to consideration of colostomy reversal.  Fahed is doing well overall.  He is bothered by a colostomy in hopes to have it taken down as soon as possible.  Is also bothered by some pain over the upper part of his midline abdominal incision that healed by secondary intention after a staph infection. He denies bleeding from the ostomy other than if there is some irritation when he changes the appliance. His appetite is good and his weight stable. He reports having seen his cardiologist Dr. Lovena Le within the last couple of weeks, but I do not see an office note from that.  The last office note on file is from September 6.  ROS: Cardiovascular:  no chest pain Respiratory: no dyspnea  The patient's Past Medical, Family and Social History were reviewed and are on file in the EMR.  Objective:  Med list reviewed  Current Outpatient Medications:  .  aspirin 81 MG chewable tablet, Chew 1 tablet (81 mg total) by mouth daily., Disp: 30 tablet, Rfl: 5 .  atorvastatin (LIPITOR) 80 MG tablet, Take 1 tablet (80 mg total) by mouth daily at 6 PM., Disp: 30 tablet, Rfl: 5 .  carvedilol (COREG) 3.125 MG tablet, Take 1 tablet (3.125 mg total) by mouth 2 (two) times daily with a meal., Disp: 60 tablet, Rfl: 0 .  clopidogrel (PLAVIX) 75 MG tablet, Take 75 mg by mouth daily., Disp: , Rfl:  .   digoxin (LANOXIN) 0.125 MG tablet, Take 1 tablet (0.125 mg total) by mouth daily., Disp: 30 tablet, Rfl: 5 .  feeding supplement, ENSURE ENLIVE, (ENSURE ENLIVE) LIQD, Take 237 mLs by mouth 3 (three) times daily between meals., Disp: 237 mL, Rfl: 12 .  ferrous sulfate 325 (65 FE) MG tablet, Take 1 tablet (325 mg total) by mouth 2 (two) times daily with a meal., Disp: 60 tablet, Rfl: 3 .  lisinopril (PRINIVIL,ZESTRIL) 2.5 MG tablet, Take 1 tablet (2.5 mg total) by mouth daily., Disp: 30 tablet, Rfl: 5 .  Multiple Vitamin (MULTIVITAMIN WITH MINERALS) TABS tablet, Take 1 tablet by mouth daily., Disp: 30 tablet, Rfl: 0 .  nitroGLYCERIN (NITROSTAT) 0.4 MG SL tablet, Place 1 tablet (0.4 mg total) under the tongue every 5 (five) minutes as needed for chest pain., Disp: 20 tablet, Rfl: 0 .  ondansetron (ZOFRAN) 4 MG tablet, Take 1 tablet (4 mg total) by mouth every 6 (six) hours., Disp: 12 tablet, Rfl: 0 .  saccharomyces boulardii (FLORASTOR) 250 MG capsule, Take 1 capsule (250 mg total) by mouth 2 (two) times daily. (Patient taking differently: Take 500 mg by mouth daily. ), Disp: 30 capsule, Rfl: 0   Vital signs in last 24 hrs: Vitals:   02/26/17 1317  BP: 114/78  Pulse: 74    Physical Exam  He is much improved from when I saw him last year, he has put on weight  and muscle mass.  HEENT: sclera anicteric, oral mucosa moist without lesions  Neck: supple, no thyromegaly, JVD or lymphadenopathy  Cardiac: RRR without murmurs, S1S2 heard, no peripheral edema  Pulm: clear to auscultation bilaterally, normal RR and effort noted  Abdomen: soft, mild upper tenderness over his midline incision.  The upper half clearly healed by secondary intention., with active bowel sounds.  Healthy-appearing colostomy left upper quadrant.   No guarding or palpable hepatosplenomegaly.  Skin; warm and dry, no jaundice or rash  Recent Labs:  CBC Latest Ref Rng & Units 01/14/2017 09/12/2016 08/29/2016  WBC 4.0 - 10.3  10e3/uL 10.1 7.2 7.4  Hemoglobin 13.0 - 17.1 g/dL 14.7 11.4(L) 12.2(L)  Hematocrit 38.4 - 49.9 % 44.2 34.5(L) 36.9(L)  Platelets 140 - 400 10e3/uL 139(L) 169 123(L)   CMP Latest Ref Rng & Units 01/14/2017 09/12/2016 08/29/2016  Glucose 70 - 140 mg/dl 156(H) 107 122  BUN 7.0 - 26.0 mg/dL 24.8 16.3 22.7  Creatinine 0.7 - 1.3 mg/dL 1.4(H) 1.2 1.7(H)  Sodium 136 - 145 mEq/L 139 140 140  Potassium 3.5 - 5.1 mEq/L 4.9 4.5 4.8  Chloride 101 - 111 mmol/L - - -  CO2 22 - 29 mEq/L 22 25 30(H)  Calcium 8.4 - 10.4 mg/dL 9.9 9.4 9.9  Total Protein 6.4 - 8.3 g/dL 7.7 6.4 6.4  Total Bilirubin 0.20 - 1.20 mg/dL 0.32 0.25 0.32  Alkaline Phos 40 - 150 U/L 64 56 62  AST 5 - 34 U/L 21 21 17   ALT 0 - 55 U/L 24 17 17      Radiologic studies:  Last echocardiogram preop April 2018, LVEF 25-30%  @ASSESSMENTPLANBEGIN @ Assessment: Encounter Diagnoses  Name Primary?  . History of colon cancer Yes  . Cardiomyopathy, ischemic   . Antiplatelet or antithrombotic long-term use       Plan: Colonoscopy to examine the proximal colon that could not be seen on the original exam because of the obstructing tumor.  He is agreeable after thorough discussion of procedure and risks.  The benefits and risks of the planned procedure were described in detail with the patient or (when appropriate) their health care proxy.  Risks were outlined as including, but not limited to, bleeding, infection, perforation, adverse medication reaction leading to cardiac or pulmonary decompensation, or pancreatitis (if ERCP).  The limitation of incomplete mucosal visualization was also discussed.  No guarantees or warranties were given. Patient at increased risk for cardiopulmonary complications of procedure due to medical comorbidities, particularly severe LV dysfunction. Therefore, his procedure will be done in the hospital endoscopy lab.  Collins will be off Plavix 5 days prior to procedure, and we will clear this with his cardiologist.   It was not an issue for his last colonoscopy, I do not suspect it will be on this 1.  He understands there is a possible risk of acute cardiovascular event with brief cessation of this medication.   Total time 30 minutes, over half spent in record review, counseling and coordination of care.   [name]

## 2017-02-26 NOTE — Patient Instructions (Addendum)
If you are age 55 or older, your body mass index should be between 23-30. Your Body mass index is 27.52 kg/m. If this is out of the aforementioned range listed, please consider follow up with your Primary Care Provider.  If you are age 65 or younger, your body mass index should be between 19-25. Your Body mass index is 27.52 kg/m. If this is out of the aformentioned range listed, please consider follow up with your Primary Care Provider.   You will be contacted by our office prior to your procedure for directions on holding your Plavix.  If you do not hear from our office 1 week prior to your scheduled procedure, please call [phone] to discuss.   You have been scheduled for a colonoscopy. Please follow written instructions given to you at your visit today.  Please pick up your prep supplies at the pharmacy within the next 1-3 days. If you use inhalers (even only as needed), please bring them with you on the day of your procedure. Your physician has requested that you go to www.startemmi.com and enter the access code given to you at your visit today. This web site gives a general overview about your procedure. However, you should still follow specific instructions given to you by our office regarding your preparation for the procedure.  Thank you.

## 2017-02-26 NOTE — Telephone Encounter (Signed)
Dr. Wynonia Lawman,  Please advise and thank you.  Caryl Pina, LPN

## 2017-02-27 ENCOUNTER — Other Ambulatory Visit: Payer: Self-pay

## 2017-02-27 ENCOUNTER — Encounter (HOSPITAL_COMMUNITY): Payer: Self-pay | Admitting: Emergency Medicine

## 2017-02-27 NOTE — Telephone Encounter (Signed)
Christopher [name]- I see where you did route anticoag note to Dr Wynonia Lawman. However, if I remember correctly, I think he is the physician who although part of [facility], is NOT in [employer]. You may have to reach out to him or his staff by fax/phone to reach the office effectively.

## 2017-02-27 NOTE — Progress Notes (Signed)
Spoke to pt during pre-call for his colonoscopy scheduled for 03/12/17. Pt states he doesn't have a ride back home and his brother, the only person that he knows, is not reliable and will not be able to be with pt or give him ride back home. This RN provided pt with  two different resources for elderly transportation , yet pt reports has no money for this service. Pt made aware that the hospital policy is to have a person responsible to sign release after discharge. Pt reports will not reschedule and will come on the scheduled day. Pt appears extremely upset with his situation and expressed frustration with his family and that he has no help or support from them.

## 2017-02-27 NOTE — Telephone Encounter (Signed)
Left message for pt to return call.

## 2017-02-27 NOTE — Telephone Encounter (Signed)
Spoke with pt he is aware that he will need to hold his Plavix 5 days prior to his procedure.  He states that he is having transportation issues. He has not been able to confirm with his brother that he will be able to bring him. In informed pt that we need two business days if he needs to cancel or he will be charged a $100 fee. He understood.  I offered to give him the transportation information through [facility] but pt states that he does not have the money to pay them.  He will keep trying to reach out to his brother and confirm his transportation needs.

## 2017-02-27 NOTE — Telephone Encounter (Signed)
Patient may stop Plavix 5-7 days prior to procedure .

## 2017-02-28 ENCOUNTER — Telehealth: Payer: Self-pay | Admitting: Gastroenterology

## 2017-02-28 NOTE — Telephone Encounter (Signed)
Yes, that date is fine if his transportation requires it.  The procedure is not urgent. He just needs updated instructions on stopping his plavix 5 days prior.

## 2017-02-28 NOTE — Telephone Encounter (Signed)
Left detailed message for patient that his procedure is now rescheduled to [facility] on 04/01/17. New prep instructions, with information in it about stopping his Plavix 5 days prior, mailed to patient. Also left information on his vm about stopping Plavix 5 days prior.

## 2017-02-28 NOTE — Telephone Encounter (Signed)
Left message for patient to call back  

## 2017-02-28 NOTE — Telephone Encounter (Signed)
Patient is unable to keep his scheduled colonoscopy on 2/12 at [facility] due to no availability of ride home. Discussed with him that the next possible date would be 3/4, are you okay with rescheduling to this date?

## 2017-03-26 ENCOUNTER — Other Ambulatory Visit: Payer: Self-pay

## 2017-03-26 ENCOUNTER — Encounter (HOSPITAL_COMMUNITY): Payer: Self-pay | Admitting: *Deleted

## 2017-03-26 DIAGNOSIS — C186 Malignant neoplasm of descending colon: Secondary | ICD-10-CM | POA: Diagnosis not present

## 2017-03-26 DIAGNOSIS — E7849 Other hyperlipidemia: Secondary | ICD-10-CM | POA: Diagnosis not present

## 2017-03-26 DIAGNOSIS — I5022 Chronic systolic (congestive) heart failure: Secondary | ICD-10-CM | POA: Diagnosis not present

## 2017-03-26 DIAGNOSIS — I2510 Atherosclerotic heart disease of native coronary artery without angina pectoris: Secondary | ICD-10-CM | POA: Diagnosis not present

## 2017-03-26 DIAGNOSIS — M5136 Other intervertebral disc degeneration, lumbar region: Secondary | ICD-10-CM | POA: Diagnosis not present

## 2017-03-26 DIAGNOSIS — Z95810 Presence of automatic (implantable) cardiac defibrillator: Secondary | ICD-10-CM | POA: Diagnosis not present

## 2017-03-26 DIAGNOSIS — I252 Old myocardial infarction: Secondary | ICD-10-CM | POA: Diagnosis not present

## 2017-03-26 DIAGNOSIS — Z955 Presence of coronary angioplasty implant and graft: Secondary | ICD-10-CM | POA: Diagnosis not present

## 2017-03-27 DIAGNOSIS — Z9049 Acquired absence of other specified parts of digestive tract: Secondary | ICD-10-CM | POA: Diagnosis not present

## 2017-03-27 DIAGNOSIS — K432 Incisional hernia without obstruction or gangrene: Secondary | ICD-10-CM | POA: Diagnosis not present

## 2017-03-27 DIAGNOSIS — C189 Malignant neoplasm of colon, unspecified: Secondary | ICD-10-CM | POA: Diagnosis not present

## 2017-03-29 ENCOUNTER — Other Ambulatory Visit: Payer: Self-pay

## 2017-04-01 ENCOUNTER — Other Ambulatory Visit: Payer: Self-pay

## 2017-04-01 ENCOUNTER — Encounter (HOSPITAL_COMMUNITY): Payer: Self-pay

## 2017-04-01 ENCOUNTER — Encounter (HOSPITAL_COMMUNITY)
Admission: RE | Disposition: A | Discharge status: Home / Self Care | Payer: Self-pay | Source: Ambulatory Visit | Attending: Gastroenterology

## 2017-04-01 ENCOUNTER — Ambulatory Visit (HOSPITAL_COMMUNITY): Payer: Medicare Other | Admitting: Anesthesiology

## 2017-04-01 ENCOUNTER — Ambulatory Visit (HOSPITAL_COMMUNITY)
Admission: RE | Admit: 2017-04-01 | Discharge: 2017-04-01 | Disposition: A | Discharge status: Home / Self Care | Payer: Medicare Other | Source: Ambulatory Visit | Attending: Gastroenterology | Admitting: Gastroenterology

## 2017-04-01 DIAGNOSIS — Z95810 Presence of automatic (implantable) cardiac defibrillator: Secondary | ICD-10-CM | POA: Insufficient documentation

## 2017-04-01 DIAGNOSIS — J449 Chronic obstructive pulmonary disease, unspecified: Secondary | ICD-10-CM | POA: Diagnosis not present

## 2017-04-01 DIAGNOSIS — Z7982 Long term (current) use of aspirin: Secondary | ICD-10-CM | POA: Insufficient documentation

## 2017-04-01 DIAGNOSIS — Z886 Allergy status to analgesic agent status: Secondary | ICD-10-CM | POA: Insufficient documentation

## 2017-04-01 DIAGNOSIS — Z955 Presence of coronary angioplasty implant and graft: Secondary | ICD-10-CM | POA: Insufficient documentation

## 2017-04-01 DIAGNOSIS — I2510 Atherosclerotic heart disease of native coronary artery without angina pectoris: Secondary | ICD-10-CM | POA: Insufficient documentation

## 2017-04-01 DIAGNOSIS — I130 Hypertensive heart and chronic kidney disease with heart failure and stage 1 through stage 4 chronic kidney disease, or unspecified chronic kidney disease: Secondary | ICD-10-CM | POA: Insufficient documentation

## 2017-04-01 DIAGNOSIS — D120 Benign neoplasm of cecum: Secondary | ICD-10-CM

## 2017-04-01 DIAGNOSIS — N189 Chronic kidney disease, unspecified: Secondary | ICD-10-CM | POA: Insufficient documentation

## 2017-04-01 DIAGNOSIS — I252 Old myocardial infarction: Secondary | ICD-10-CM | POA: Diagnosis not present

## 2017-04-01 DIAGNOSIS — E785 Hyperlipidemia, unspecified: Secondary | ICD-10-CM | POA: Diagnosis not present

## 2017-04-01 DIAGNOSIS — K5730 Diverticulosis of large intestine without perforation or abscess without bleeding: Secondary | ICD-10-CM | POA: Diagnosis not present

## 2017-04-01 DIAGNOSIS — Z933 Colostomy status: Secondary | ICD-10-CM | POA: Diagnosis not present

## 2017-04-01 DIAGNOSIS — Z79899 Other long term (current) drug therapy: Secondary | ICD-10-CM | POA: Insufficient documentation

## 2017-04-01 DIAGNOSIS — I5022 Chronic systolic (congestive) heart failure: Secondary | ICD-10-CM | POA: Diagnosis not present

## 2017-04-01 DIAGNOSIS — Z1211 Encounter for screening for malignant neoplasm of colon: Secondary | ICD-10-CM | POA: Insufficient documentation

## 2017-04-01 DIAGNOSIS — Z85038 Personal history of other malignant neoplasm of large intestine: Secondary | ICD-10-CM | POA: Diagnosis not present

## 2017-04-01 HISTORY — PX: COLONOSCOPY WITH PROPOFOL: SHX5780

## 2017-04-01 SURGERY — COLONOSCOPY WITH PROPOFOL
Anesthesia: Monitor Anesthesia Care

## 2017-04-01 MED ORDER — EPHEDRINE SULFATE-NACL 50-0.9 MG/10ML-% IV SOSY
PREFILLED_SYRINGE | INTRAVENOUS | Status: DC | PRN
  Administered 2017-04-01: 15 mg via INTRAVENOUS

## 2017-04-01 MED ORDER — PROPOFOL 10 MG/ML IV BOLUS
INTRAVENOUS | Status: AC
  Filled 2017-04-01: qty 60

## 2017-04-01 MED ORDER — PROPOFOL 10 MG/ML IV BOLUS
INTRAVENOUS | Status: DC | PRN
  Administered 2017-04-01: 50 mg via INTRAVENOUS

## 2017-04-01 MED ORDER — PROPOFOL 500 MG/50ML IV EMUL
INTRAVENOUS | Status: DC | PRN
  Administered 2017-04-01: 100 ug/kg/min via INTRAVENOUS

## 2017-04-01 MED ORDER — PHENYLEPHRINE 40 MCG/ML (10ML) SYRINGE FOR IV PUSH (FOR BLOOD PRESSURE SUPPORT)
PREFILLED_SYRINGE | INTRAVENOUS | Status: DC | PRN
  Administered 2017-04-01 (×2): 120 ug via INTRAVENOUS
  Administered 2017-04-01: 80 ug via INTRAVENOUS

## 2017-04-01 MED ORDER — SODIUM CHLORIDE 0.9 % IV SOLN: INTRAVENOUS | Status: DC

## 2017-04-01 MED ORDER — LACTATED RINGERS IV SOLN
INTRAVENOUS | Status: DC
  Administered 2017-04-01: 1000 mL via INTRAVENOUS
  Administered 2017-04-01: 08:00:00 via INTRAVENOUS

## 2017-04-01 SURGICAL SUPPLY — 22 items

## 2017-04-01 NOTE — Anesthesia Postprocedure Evaluation (Signed)
Anesthesia Post Note  Patient: Christopher [name]  Procedure([name]) Performed: COLONOSCOPY WITH PROPOFOL (N/A )     Patient location during evaluation: PACU Anesthesia Type: MAC Level of consciousness: awake and alert Pain management: pain level controlled Vital Signs Assessment: post-procedure vital signs reviewed and stable Respiratory status: spontaneous breathing, nonlabored ventilation, respiratory function stable and patient connected to nasal cannula oxygen Cardiovascular status: stable and blood pressure returned to baseline Postop Assessment: no apparent nausea or vomiting Anesthetic complications: no    Last Vitals:  Vitals:   04/01/17 0900 04/01/17 0910  BP: 101/72 112/80  Pulse: 77 73  Resp: 13 20  Temp:    SpO2: 98% 93%    Last Pain:  Vitals:   04/01/17 0851  TempSrc: Oral                 Christopher [name]

## 2017-04-01 NOTE — Op Note (Signed)
[facility] Patient Name: Christopher [name] Procedure Date: 04/01/2017 MRN: [id number] Attending MD: Estill Cotta. Loletha Carrow , MD Date of Birth: 1960/02/13 CSN: [id number] Age: 57 Admit Type: Outpatient Procedure:                Colonoscopy Indications:              High risk colon cancer surveillance: Personal                            history of colon cancer (obstructing left colon                            cancer 2018 precluding full colonoscopy at that                            time) Providers:                Estill Cotta. Loletha Carrow, MD, Burtis Junes, RN, [name], Technician Referring MD:             [name], MD Medicines:                Monitored Anesthesia Care Complications:            No immediate complications. Estimated Blood Loss:     Estimated blood loss was minimal. Procedure:                Pre-Anesthesia Assessment:                           - Prior to the procedure, a History and Physical                            was performed, and patient medications and                            allergies were reviewed. The patient's tolerance of                            previous anesthesia was also reviewed. The risks                            and benefits of the procedure and the sedation                            options and risks were discussed with the patient.                            All questions were answered, and informed consent                            was obtained. Prior Anticoagulants: The patient                            last took  aspirin 1 day and Plavix (clopidogrel) 7                            days prior to the procedure. ASA Grade Assessment:                            III - A patient with severe systemic disease. After                            reviewing the risks and benefits, the patient was                            deemed in satisfactory condition to undergo the                            procedure.                            After obtaining informed consent, the colonoscope                            was passed under direct vision. Throughout the                            procedure, the patient's blood pressure, pulse, and                            oxygen saturations were monitored continuously. The                            EC-[id number] ([id number]) scope was introduced through                            the descending colostomy and advanced to the the                            cecum, identified by appendiceal orifice and                            ileocecal valve. The colonoscopy was performed                            without difficulty. The patient tolerated the                            procedure well. The quality of the bowel                            preparation was excellent. The ileocecal valve, the                            appendiceal orifice and the rectum were  photographed. Scope In: 8:29:42 AM Scope Out: 8:45:26 AM Scope Withdrawal Time: 0 hours 12 minutes 54 seconds  Total Procedure Duration: 0 hours 15 minutes 44 seconds  Findings:      A 6 mm polyp was found in the cecum. The polyp was sessile. The polyp       was removed with a cold snare. Resection and retrieval were complete.      The scope was then withdrawn from the ostomy.      A rectal exam was then performed and was normal. The scope was then       introduced through the anus and advanced to the proximal sigmoid colon,       where the colon had been divided surgically (at about 35cm from the       anus).      Multiple small-mouthed diverticula were found in the left colon.      Retroflexion in the rectum was not performed due to anatomy. Impression:               - One 6 mm polyp in the cecum, removed with a cold                            snare. Resected and retrieved.                           - Diverticulosis in the left colon. Moderate Sedation:      MAC sedation  used Recommendation:           - Patient has a contact number available for                            emergencies. The signs and symptoms of potential                            delayed complications were discussed with the                            patient. Return to normal activities tomorrow.                            Written discharge instructions were provided to the                            patient.                           - Resume previous diet.                           - Resume Plavix (clopidogrel) at prior dose                            tomorrow.                           - Await pathology results.                           - Repeat  colonoscopy in 1 year for surveillance                            based on personal history of colon cancer. Procedure Code(s):        --- Professional ---                           [phone], Colonoscopy through stoma; with removal of                            tumor(s), polyp(s), or other lesion(s) by snare                            technique Diagnosis Code(s):        --- Professional ---                           Q96.438, Personal history of other malignant                            neoplasm of large intestine                           D12.0, Benign neoplasm of cecum                           K57.30, Diverticulosis of large intestine without                            perforation or abscess without bleeding CPT copyright 2016 American Medical Association. All rights reserved. The codes documented in this report are preliminary and upon coder review may  be revised to meet current compliance requirements. Christopher [name] L. Loletha Carrow, MD 04/01/2017 8:54:41 AM This report has been signed electronically. Number of Addenda: 0

## 2017-04-01 NOTE — Interval H&P Note (Signed)
History and Physical Interval Note:  04/01/2017 10:05 AM  Christopher [name]  has presented today for surgery, with the diagnosis of Hx of Colon Cancer  The various methods of treatment have been discussed with the patient and family. After consideration of risks, benefits and other options for treatment, the patient has consented to  Procedure(s): COLONOSCOPY WITH PROPOFOL (N/A) as a surgical intervention .  The patient's history has been reviewed, patient examined, no change in status, stable for surgery.  I have reviewed the patient's chart and labs.  Questions were answered to the patient's satisfaction.     [name]

## 2017-04-01 NOTE — Transfer of Care (Signed)
Immediate Anesthesia Transfer of Care Note  Patient: Christopher [name]  Procedure(s) Performed: COLONOSCOPY WITH PROPOFOL (N/A )  Patient Location: PACU and Endoscopy Unit  Anesthesia Type:MAC  Level of Consciousness: awake, alert  and oriented  Airway & Oxygen Therapy: Patient Spontanous Breathing  Post-op Assessment: Report given to RN and Post -op Vital signs reviewed and stable  Post vital signs: Reviewed and stable  Last Vitals:  Vitals:   04/01/17 0723  BP: 116/71  Pulse: 81  Resp: 13  Temp: 36.4 C  SpO2: 98%    Last Pain:  Vitals:   04/01/17 0723  TempSrc: Oral         Complications: No apparent anesthesia complications

## 2017-04-01 NOTE — H&P (Signed)
History:  This patient presents for endoscopic testing for history of colon cancer.  Christopher [name] Referring physician: System, Pcp Not In  Past Medical History: Past Medical History:  Diagnosis Date  . Automatic implantable cardioverter-defibrillator in situ    MDT Aug 2015 Dr. Lovena Le  . CAD (coronary artery disease), native coronary artery 07/03/2013   Cath 06/20/13  Normal left main, occluded LAD, occluded RCA, 50% circ EF 15%  3.0 x28 and 3.0 x 8 mm Xience stent Dr. Tamala Julian  To LAD   . Cancer ([place])   . Chronic kidney disease   . Chronic systolic CHF (congestive heart failure) (HCC)    ECHO 08/05/13  EF 30%  Anterior akinesis and inferior hypokinesis   . COPD (chronic obstructive pulmonary disease) ([place])   . Hyperlipidemia   . Mass of colon 04/2016  . Myocardial infarction ([place]) 04/29/2013  . Thrombocytopenia ([place]) 12/27/2014   Chronic       Past Surgical History: Past Surgical History:  Procedure Laterality Date  . CARDIAC CATHETERIZATION  05/2013   . CARDIAC CATHETERIZATION N/A 12/28/2014   Procedure: Left Heart Cath and Coronary Angiography;  Surgeon: [name], MD;  Location: [place] [facility];  Service: Cardiovascular;  Laterality: N/A;  . CARDIAC CATHETERIZATION N/A 12/28/2014   Procedure: Coronary Stent Intervention;  Surgeon: [name], MD;  Location: [place] [facility];  Service: Cardiovascular;  Laterality: N/A;  . COLECTOMY WITH COLOSTOMY CREATION/HARTMANN PROCEDURE Left 05/18/2016   Procedure: COLECTOMY WITH OSTOMY CREATION/HARTMANN PROCEDURE;  Surgeon: [name], MD;  Location: [facility];  Service: General;  Laterality: Left;  . CORONARY ANGIOPLASTY  05/2013   . FLEXIBLE SIGMOIDOSCOPY N/A 05/16/2016   Procedure: FLEXIBLE SIGMOIDOSCOPY;  Surgeon: [name], MD;  Location: [facility];  Service: Gastroenterology;  Laterality: N/A;  . ICD placement  09/07/2013   . IMPLANTABLE CARDIOVERTER DEFIBRILLATOR IMPLANT N/A 09/07/2013   Procedure: IMPLANTABLE  CARDIOVERTER DEFIBRILLATOR IMPLANT;  Surgeon: [name], MD;  Location: [facility] CATH LAB;  Service: Cardiovascular;  Laterality: N/A;  . INTRA-AORTIC BALLOON PUMP INSERTION  06/20/2013   Procedure: INTRA-AORTIC BALLOON PUMP INSERTION;  Surgeon: Sinclair Grooms, MD;  Location: [facility] CATH LAB;  Service: Cardiovascular;;  . LEFT HEART CATHETERIZATION WITH CORONARY ANGIOGRAM N/A 06/20/2013   Procedure: LEFT HEART CATHETERIZATION WITH CORONARY ANGIOGRAM;  Surgeon: Sinclair Grooms, MD;  [facility];  Service: Cardiovascular;  Laterality: N/A;  . PERCUTANEOUS CORONARY STENT INTERVENTION (PCI-S)  06/20/2013   Procedure: PERCUTANEOUS CORONARY STENT INTERVENTION (PCI-S);  Surgeon: Sinclair Grooms, MD;  Location: [facility] CATH LAB;  Service: Cardiovascular;;    Allergies: Allergies  Allergen Reactions  . Capecitabine Other (See Comments)    Server dehydration and diarrhea      . Celebrex [Celecoxib] Other (See Comments)    Caused his back to burn  . Contrast Media [Iodinated Diagnostic Agents] Other (See Comments)    Patient feels weakness    Outpatient Meds: Current Facility-Administered Medications  Medication Dose Route Frequency Provider Last Rate Last Dose  . 0.9 %  sodium chloride infusion   Intravenous Continuous [name], MD      . lactated ringers infusion   Intravenous Continuous [name], MD 10 mL/hr at 04/01/17 2297     Current Outpatient Medications  Medication Sig Dispense Refill  . aspirin 81 MG chewable tablet Chew 1 tablet (81 mg total) by mouth daily. 30 tablet 5  . atorvastatin (LIPITOR) 80  MG tablet Take 1 tablet (80 mg total) by mouth daily at 6 PM. 30 tablet 5  . carvedilol (COREG) 3.125 MG tablet Take 1 tablet (3.125 mg total) by mouth 2 (two) times daily with a meal. 60 tablet 0  . clopidogrel (PLAVIX) 75 MG tablet Take 75 mg by mouth daily.    . digoxin (LANOXIN) 0.125 MG tablet Take 1 tablet (0.125 mg total) by mouth daily. 30 tablet 5  .  ferrous sulfate 325 (65 FE) MG tablet Take 1 tablet (325 mg total) by mouth 2 (two) times daily with a meal. 60 tablet 3  . lisinopril (PRINIVIL,ZESTRIL) 2.5 MG tablet Take 1 tablet (2.5 mg total) by mouth daily. 30 tablet 5  . Multiple Vitamin (MULTIVITAMIN WITH MINERALS) TABS tablet Take 1 tablet by mouth daily. 30 tablet 0  . nitroGLYCERIN (NITROSTAT) 0.4 MG SL tablet Place 1 tablet (0.4 mg total) under the tongue every 5 (five) minutes as needed for chest pain. 20 tablet 0  . saccharomyces boulardii (FLORASTOR) 250 MG capsule Take 1 capsule (250 mg total) by mouth 2 (two) times daily. (Patient taking differently: Take 500 mg by mouth daily. ) 30 capsule 0  . ondansetron (ZOFRAN) 4 MG tablet Take 1 tablet (4 mg total) by mouth every 6 (six) hours. 12 tablet 0      ___________________________________________________________________ Objective   Exam:  BP 112/80   Pulse 73   Temp 97.9 F (36.6 C) (Oral)   Resp 20   Ht 5\' 8"  (1.727 m)   Wt 181 lb (82.1 kg)   SpO2 93%   BMI 27.52 kg/m    CV: RRR without murmur, S1/S2, no JVD, no peripheral edema  Resp: clear to auscultation bilaterally, normal RR and effort noted  GI: soft, no tenderness, with active bowel sounds. No guarding or palpable organomegaly noted.  colostmoy LUQ  Neuro: awake, alert and oriented x 3. Normal gross motor function and fluent speech   Assessment:  Hx colon cancer  Plan:  Colonoscopy via colostomy and sigmoidoscopy   [name]

## 2017-04-01 NOTE — Anesthesia Preprocedure Evaluation (Signed)
Anesthesia Evaluation  Patient identified by MRN, date of birth, ID band Patient awake    Reviewed: Allergy & Precautions, NPO status , [name] Chart, lab work & pertinent test results  Airway Mallampati: II  TM Distance: >3 FB Neck ROM: Full    Dental no notable dental hx.    Pulmonary neg pulmonary ROS, former smoker,    Pulmonary exam normal breath sounds clear to auscultation       Cardiovascular hypertension, + CAD, + Past MI and +CHF  Normal cardiovascular exam+ Cardiac Defibrillator  Rhythm:Regular Rate:Normal     Neuro/Psych negative neurological ROS  negative psych ROS   GI/Hepatic negative GI ROS, Neg liver ROS,   Endo/Other  negative endocrine ROS  Renal/GU negative Renal ROS  negative genitourinary   Musculoskeletal negative musculoskeletal ROS (+)   Abdominal   Peds negative pediatric ROS (+)  Hematology  (+) anemia ,   Anesthesia Other Findings   Reproductive/Obstetrics negative OB ROS                             Anesthesia Physical Anesthesia Plan  ASA: IV  Anesthesia Plan: MAC   Post-op Pain Management:    Induction: Intravenous  PONV Risk Score and Plan: 0  Airway Management Planned: Simple Face Mask  Additional Equipment:   Intra-op Plan:   Post-operative Plan:   Informed Consent: I have reviewed the patients History and Physical, chart, labs and discussed the procedure including the risks, benefits and alternatives for the proposed anesthesia with the patient or authorized representative who has indicated his/her understanding and acceptance.   Dental advisory given  Plan Discussed with: CRNA and Surgeon  Anesthesia Plan Comments:         Anesthesia Quick Evaluation

## 2017-04-01 NOTE — Discharge Instructions (Signed)
YOU HAD AN ENDOSCOPIC PROCEDURE TODAY: Refer to the procedure report and other information in the discharge instructions given to you for any specific questions about what was found during the examination. If this information does not answer your questions, please call [place] office at [phone] to clarify.  ° °YOU SHOULD EXPECT: Some feelings of bloating in the abdomen. Passage of more gas than usual. Walking can help get rid of the air that was put into your GI tract during the procedure and reduce the bloating. If you had a lower endoscopy (such as a colonoscopy or flexible sigmoidoscopy) you may notice spotting of blood in your stool or on the toilet paper. Some abdominal soreness may be present for a day or two, also. ° °DIET: Your first meal following the procedure should be a light meal and then it is ok to progress to your normal diet. A half-sandwich or bowl of soup is an example of a good first meal. Heavy or fried foods are harder to digest and may make you feel nauseous or bloated. Drink plenty of fluids but you should avoid alcoholic beverages for 24 hours. If you had a esophageal dilation, please see attached instructions for diet.   ° °ACTIVITY: Your care partner should take you home directly after the procedure. You should plan to take it easy, moving slowly for the rest of the day. You can resume normal activity the day after the procedure however YOU SHOULD NOT DRIVE, use power tools, machinery or perform tasks that involve climbing or major physical exertion for 24 hours (because of the sedation medicines used during the test).  ° °SYMPTOMS TO REPORT IMMEDIATELY: °A gastroenterologist can be reached at any hour. Please call [phone]  for any of the following symptoms:  °Following lower endoscopy (colonoscopy, flexible sigmoidoscopy) °Excessive amounts of blood in the stool  °Significant tenderness, worsening of abdominal pains  °Swelling of the abdomen that is new, acute  °Fever of 100° or  higher  °Following upper endoscopy (EGD, EUS, ERCP, esophageal dilation) °Vomiting of blood or coffee ground material  °New, significant abdominal pain  °New, significant chest pain or pain under the shoulder blades  °Painful or persistently difficult swallowing  °New shortness of breath  °Black, tarry-looking or red, bloody stools ° °FOLLOW UP:  °If any biopsies were taken you will be contacted by phone or by letter within the next 1-3 weeks. Call [phone]  if you have not heard about the biopsies in 3 weeks.  °Please also call with any specific questions about appointments or follow up tests. ° °

## 2017-04-03 ENCOUNTER — Encounter: Payer: Self-pay | Admitting: Gastroenterology

## 2017-04-03 ENCOUNTER — Encounter (HOSPITAL_COMMUNITY): Payer: Self-pay | Admitting: Gastroenterology

## 2017-04-05 ENCOUNTER — Telehealth: Payer: Self-pay | Admitting: Gastroenterology

## 2017-04-05 NOTE — Telephone Encounter (Signed)
Left message for patient I was returning his call. 

## 2017-04-24 ENCOUNTER — Ambulatory Visit: Payer: Self-pay | Admitting: General Surgery

## 2017-04-30 ENCOUNTER — Telehealth: Payer: Self-pay | Admitting: *Deleted

## 2017-04-30 DIAGNOSIS — Z036 Encounter for observation for suspected toxic effect from ingested substance ruled out: Secondary | ICD-10-CM | POA: Diagnosis not present

## 2017-04-30 NOTE — Telephone Encounter (Signed)
Pt called requesting to cancel appts with Dr. Burr Medico on 05/15/17.   Stated he will have pre op labs on  05/14/17; surgery for colostomy reversal by Dr. Jacinto Reap. Thompson on  05/20/17.   Pt requested to reschedule appts for 2 months post surgery. Called pt and left message on voice mail requesting a call back to nurse. Appts for 4/17 cancelled per pt's request. Pt's    Phone     [phone].

## 2017-05-01 ENCOUNTER — Telehealth: Payer: Self-pay | Admitting: *Deleted

## 2017-05-01 NOTE — Telephone Encounter (Signed)
Pt called wanted to make sure appt for 4/17 was cancelled & wants to r/s for 2 months after colostomy reversal scheduled for 05/20/17.  OK per Dr Burr Medico & message sent to [facility].

## 2017-05-03 ENCOUNTER — Telehealth: Payer: Self-pay | Admitting: Hematology

## 2017-05-03 NOTE — Telephone Encounter (Signed)
Patient requested appointments to be moved from April to June per 4/3 sch msg/  Letter/calendar mailed to patient

## 2017-05-07 DIAGNOSIS — K091 Developmental (nonodontogenic) cysts of oral region: Secondary | ICD-10-CM | POA: Diagnosis not present

## 2017-05-08 DIAGNOSIS — K091 Developmental (nonodontogenic) cysts of oral region: Secondary | ICD-10-CM | POA: Diagnosis not present

## 2017-05-13 NOTE — Pre-Procedure Instructions (Signed)
Christopher [name]  05/13/2017      [id number] [address], [place] [address] Phone: [phone] Fax: [phone]  [place]), [place] [address] [place] ([place]) [place] [address] Phone: [phone] Fax: [place], [place] [address] [place] [address] Phone: [phone] Fax: [place], [place]. [place]. [place] [address] Phone: [phone] Fax: [place], [place] [address] [place] [address] Phone: [phone] Fax: [phone]    Your procedure is scheduled on May 20, 2017  Report to [facility] Admitting at 1100 A.M.  Call this number if you have problems the morning of surgery:  [phone]   Remember:  Please complete your 2 PRE-SURGERY ENSURES that were given to you the night before your surgery.  Please, if able, drink it in one setting. DO NOT SIP.   Do not eat any solid food after midnight the night before your surgery.    You may drink CLEAR liquids (NO CREAMER or MILK in coffee) up to 10:00am the morning of your surgery.    Please complete your 1 PRE-SURGERY ENSURE that was given to you by 10:00 the morning of surgery.  Please, if able, drink it in one setting. DO NOT SIP.   Continue all medications as directed by your physician except follow these medication instructions before surgery below.   Take these medicines the morning of surgery with A SIP OF WATER  carvedilol (COREG) digoxin (LANOXIN) nitroGLYCERIN (NITROSTAT) if needed ondansetron (ZOFRAN)  Follow Physicians instructions about clopidogrel (PLAVIX) and Aspirin.  7 days prior to surgery STOP taking any Aleve, Naproxen, Ibuprofen, Motrin, Advil,  Goody's, BC's, all herbal medications, fish oil, and all vitamins    Do not wear jewelry  Do not wear lotions, powders, or cologne, or deodorant.  Men may shave face and neck.  Do not bring valuables to the hospital.  [facility] is not responsible for any belongings or valuables.  Hearing aids, eyeglasses, contacts, dentures or bridgework may not be worn into surgery.  Leave your suitcase in the car.  After surgery it may be brought to your room.  For patients admitted to the hospital, discharge time will be determined by your treatment team.  Patients discharged the day of surgery will not be allowed to drive home.    Special instructions:   [place]- Preparing For Surgery  Before surgery, you can play an important role. Because skin is not sterile, your skin needs to be as free of germs as possible. You can reduce the number of germs on your skin by washing with CHG (chlorahexidine gluconate) Soap before surgery.  CHG is an antiseptic cleaner which kills germs and bonds with the skin to continue killing germs even after washing.  Please do not use if you have an allergy to CHG or antibacterial soaps. If your skin becomes reddened/irritated stop using the CHG.  Do not shave (including legs and underarms) for at least 48 hours prior to first CHG shower. It is OK to shave your face.  Please follow these instructions carefully.   1. Shower the NIGHT BEFORE SURGERY and the MORNING OF SURGERY with CHG.   2. If you chose to wash  your hair, wash your hair first as usual with your normal shampoo.  3. After you shampoo, rinse your hair and body thoroughly to remove the shampoo.  4. Use CHG as you would any other liquid soap. You can apply CHG directly to the skin and wash gently with a scrungie or a clean washcloth.   5. Apply the CHG Soap to your body ONLY FROM THE NECK DOWN.  Do not use on open wounds or open sores. Avoid contact with your eyes, ears, mouth and genitals (private  parts). Wash Face and genitals (private parts)  with your normal soap.  6. Wash thoroughly, paying special attention to the area where your surgery will be performed.  7. Thoroughly rinse your body with warm water from the neck down.  8. DO NOT shower/wash with your normal soap after using and rinsing off the CHG Soap.  9. Pat yourself dry with a CLEAN TOWEL.  10. Wear CLEAN PAJAMAS to bed the night before surgery, wear comfortable clothes the morning of surgery  11. Place CLEAN SHEETS on your bed the night of your first shower and DO NOT SLEEP WITH PETS.    Day of Surgery: Do not apply any deodorants/lotions. Please wear clean clothes to the hospital/surgery center.      Please read over the following fact sheets that you were given.

## 2017-05-13 NOTE — Pre-Procedure Instructions (Signed)
FADY STAMPS  05/13/2017      [id number] [address], [place] [address] Phone: [phone] Fax: [phone]  [place]), [place] [address] [place] ([place]) [place] [address] Phone: [phone] Fax: [place], [place] [address] [place] [address] Phone: [phone] Fax: [place], [place]. [place]. [place] [address] Phone: [phone] Fax: [place], [place] [address] [place] [address] Phone: [phone] Fax: [phone]    Your procedure is scheduled on April 21  Report to [facility] at 1100 A.M.  Call this number if you have problems the morning of surgery:  [phone]   Remember:  Do not eat food or drink liquids after midnight.  Continue all medications as directed by your physician except follow these medication instructions before surgery below.   Take these medicines the morning of surgery with A SIP OF WATER  carvedilol (COREG) digoxin (LANOXIN) nitroGLYCERIN (NITROSTAT) if needed ondansetron (ZOFRAN)  Follow Physicians instructions about clopidogrel (PLAVIX)  7 days prior to surgery STOP taking any Aspirin(unless otherwise instructed by your surgeon), Aleve, Naproxen, Ibuprofen, Motrin, Advil, Goody's, BC's, all herbal medications, fish oil, and all vitamins    Do not wear jewelry  Do not wear lotions, powders, or cologne, or deodorant.  Men may shave face and neck.  Do not bring valuables to the hospital.  [facility] is not responsible for any belongings or valuables.  Contacts, dentures or bridgework may not be worn into surgery.  Leave your suitcase in the car.  After surgery it may  be brought to your room.  For patients admitted to the hospital, discharge time will be determined by your treatment team.  Patients discharged the day of surgery will not be allowed to drive home.    Special instructions:   [place]- Preparing For Surgery  Before surgery, you can play an important role. Because skin is not sterile, your skin needs to be as free of germs as possible. You can reduce the number of germs on your skin by washing with CHG (chlorahexidine gluconate) Soap before surgery.  CHG is an antiseptic cleaner which kills germs and bonds with the skin to continue killing germs even after washing.  Please do not use if you have an allergy to CHG or antibacterial soaps. If your skin becomes reddened/irritated stop using the CHG.  Do not shave (including legs and underarms) for at least 48 hours prior to first CHG shower. It is OK to shave your face.  Please follow these instructions carefully.   1. Shower the NIGHT BEFORE SURGERY and the MORNING OF SURGERY with CHG.   2. If you chose to wash your hair, wash your hair first as usual with your normal shampoo.  3. After you shampoo, rinse your hair and body thoroughly to remove the shampoo.  4. Use CHG as you would any other liquid soap. You can apply CHG directly to the skin and wash gently with a scrungie or a clean washcloth.   5. Apply the CHG Soap to your body ONLY FROM THE NECK DOWN.  Do not use on open wounds or open sores. Avoid contact with your eyes, ears, mouth and  genitals (private parts). Wash Face and genitals (private parts)  with your normal soap.  6. Wash thoroughly, paying special attention to the area where your surgery will be performed.  7. Thoroughly rinse your body with warm water from the neck down.  8. DO NOT shower/wash with your normal soap after using and rinsing off the CHG Soap.  9. Pat yourself dry with a CLEAN TOWEL.  10. Wear CLEAN PAJAMAS to bed the night before surgery, wear  comfortable clothes the morning of surgery  11. Place CLEAN SHEETS on your bed the night of your first shower and DO NOT SLEEP WITH PETS.    Day of Surgery: Do not apply any deodorants/lotions. Please wear clean clothes to the hospital/surgery center.      Please read over the following fact sheets that you were given.

## 2017-05-14 ENCOUNTER — Encounter (HOSPITAL_COMMUNITY): Payer: Self-pay

## 2017-05-14 ENCOUNTER — Other Ambulatory Visit: Payer: Self-pay

## 2017-05-14 ENCOUNTER — Encounter (HOSPITAL_COMMUNITY)
Admission: RE | Admit: 2017-05-14 | Discharge: 2017-05-14 | Disposition: A | Discharge status: Home / Self Care | Payer: Medicare Other | Source: Ambulatory Visit | Attending: General Surgery | Admitting: General Surgery

## 2017-05-14 DIAGNOSIS — I509 Heart failure, unspecified: Secondary | ICD-10-CM | POA: Insufficient documentation

## 2017-05-14 DIAGNOSIS — Z01812 Encounter for preprocedural laboratory examination: Secondary | ICD-10-CM | POA: Insufficient documentation

## 2017-05-14 DIAGNOSIS — Z933 Colostomy status: Secondary | ICD-10-CM | POA: Diagnosis not present

## 2017-05-14 DIAGNOSIS — I2510 Atherosclerotic heart disease of native coronary artery without angina pectoris: Secondary | ICD-10-CM | POA: Diagnosis not present

## 2017-05-14 DIAGNOSIS — D696 Thrombocytopenia, unspecified: Secondary | ICD-10-CM | POA: Insufficient documentation

## 2017-05-14 DIAGNOSIS — N189 Chronic kidney disease, unspecified: Secondary | ICD-10-CM | POA: Insufficient documentation

## 2017-05-14 DIAGNOSIS — Z79899 Other long term (current) drug therapy: Secondary | ICD-10-CM | POA: Diagnosis not present

## 2017-05-14 HISTORY — DX: Anemia, unspecified: D64.9

## 2017-05-14 HISTORY — DX: Anxiety disorder, unspecified: F41.9

## 2017-05-14 LAB — HEMOGLOBIN A1C
Hgb A1c MFr Bld: 6 % — ABNORMAL HIGH (ref 4.8–5.6)
Mean Plasma Glucose: 125.5 mg/dL

## 2017-05-14 LAB — CBC
HCT: 41.8 % (ref 39.0–52.0)
Hemoglobin: 14.4 g/dL (ref 13.0–17.0)
MCH: 32.5 pg (ref 26.0–34.0)
MCHC: 34.4 g/dL (ref 30.0–36.0)
MCV: 94.4 fL (ref 78.0–100.0)
PLATELETS: 111 10*3/uL — AB (ref 150–400)
RBC: 4.43 MIL/uL (ref 4.22–5.81)
RDW: 14.3 % (ref 11.5–15.5)
WBC: 8.9 10*3/uL (ref 4.0–10.5)

## 2017-05-14 LAB — BASIC METABOLIC PANEL
Anion gap: 11 (ref 5–15)
BUN: 24 mg/dL — ABNORMAL HIGH (ref 6–20)
CHLORIDE: 108 mmol/L (ref 101–111)
CO2: 21 mmol/L — AB (ref 22–32)
CREATININE: 1.45 mg/dL — AB (ref 0.61–1.24)
Calcium: 9.6 mg/dL (ref 8.9–10.3)
GFR calc non Af Amer: 53 mL/min — ABNORMAL LOW (ref >60)
Glucose, Bld: 111 mg/dL — ABNORMAL HIGH (ref 65–99)
Potassium: 4.5 mmol/L (ref 3.5–5.1)
Sodium: 140 mmol/L (ref 135–145)

## 2017-05-14 NOTE — Progress Notes (Signed)
PCP - Dr. Aundra Dubin, [facility] Cardiologist - Dr. Wynonia Lawman, manages patient's remote ICD checks and patient states he sees Dr. Wynonia Lawman a few times a year EP - Dr. Lovena Le, implanted ICD 2015, sees Dr. Lovena Le once a year  ICD - order form received from Dr. Tanna Furry office and in chart; emailed Tomi Bamberger and Erlene Quan to make them aware of surgery date and time.  Chest x-ray - n/a EKG - 07/23/2016 Stress Test - 12/24/2014 ECHO - 05/17/2016 Cardiac Cath - 12/28/2014  Sleep Study - patient denies  Blood Thinner Instructions: Patient stated he was told to hold plavix 5 days prior to surgery. Aspirin Instructions: Patient unsure if he is to hold his ASA, he is going to go back through his paperwork and contact Dr. Thurman Coyer office  Anesthesia review: yes, cardiac history  Patient denies shortness of breath, fever, cough and chest pain at PAT appointment   Patient verbalized understanding of instructions that were given to them at the PAT appointment. Patient was also instructed that they will need to review over the PAT instructions again at home before surgery.

## 2017-05-14 NOTE — Progress Notes (Signed)
Anesthesia Chart Review:    Case:  [id number] Date/Time:  05/20/17 1245   Procedure:  COLOSTOMY TAKEDOWN ERAS PATHWAY (N/A )   Anesthesia type:  General   Pre-op diagnosis:  colostomy in place   Location:  [facility] ROOM 09 / [facility]   Surgeon:  [name], MD      DISCUSSION: Pt with hx CAD (s/p stenting x2), chronic CHF (EF 25%), AICD, and chronic thrombocytopenia (platelets 111). Pt to hold plavix 5 days before surgery.    VS: BP 130/82   Pulse 68   Temp 36.7 C   Resp 20   Ht 5' 7.5" (1.715 m)   Wt 181 lb (82.1 kg)   SpO2 97%   BMI 27.93 kg/m   PROVIDERS: Patient Care Team: [name], MD as Consulting Physician (Cardiology) [name], MD as Consulting Physician (Cardiology) [name], MD as Consulting Physician (Hematology) [name], MD as Consulting Physician (General Surgery)  - PCP is Clancy Gourd, MD  LABS: Labs reviewed: Acceptable for surgery. - HbA1c 6.0, glucose 111 - Platelets 111  IMAGES: CT chest, abd, pelvis 01/14/17:  1. Stable exam. No new or progressive findings in the chest, abdomen, or pelvis. The tiny hypervascular focus in the dome of the liver seen on the previous study is not evident on today's exam. No evidence for metastatic disease on today's CT scan. 2.  Aortic Atherosclerois (ICD10-170.0) 3. Distal transverse end colostomy. 4.  Emphysema  EKG 07/23/16: Sinus rhythm. Right atrial enlargement Probable inferior infarct, old. Anterolateral infarct, age indeterminate. LVH   CV:  Echo 05/17/16:  - Left ventricle: The cavity size was mildly dilated. Wall thickness was normal. Systolic function was severely reduced. The estimated ejection fraction was in the range of 25% to 30%. Akinesis of the anteroseptal, anterior, and apical myocardium. Doppler parameters are consistent with abnormal left ventricular relaxation (grade 1 diastolic dysfunction).  Cardiac cath 12/28/14:   Mid RCA lesion, 100% stenosed.  2nd Mrg lesion,  95% stenosed. Post intervention, there is a 0% residual stenosis.  There is severe left ventricular systolic dysfunction. - Severe global LV dysfunction with a dilated left ventricle and ejection fraction of approximately 10%. - Widely patent stent in the proximal LAD and a otherwise normal LAD with distal septal collaterals supplying the distal RCA PDA system; large left circumflex coronary artery with a very large OM 2 branch that had 95% eccentric ulcerated plaque, proximal 99% RCA stenosis followed by a focal aneurysm prior to the RV marginal branch takeoff with total occlusion of the RCA after the marginal branch.  Perioperative prescription form for ICD states procedure may interfere with device function.  Magnet should be placed over device during procedure.  Postop interrogation is not needed.   Past Medical History:  Diagnosis Date  . Anemia   . Anxiety   . Automatic implantable cardioverter-defibrillator in situ    MDT Aug 2015 Dr. Lovena Le  . CAD (coronary artery disease), native coronary artery 07/03/2013   Cath 06/20/13  Normal left main, occluded LAD, occluded RCA, 50% circ EF 15%  3.0 x28 and 3.0 x 8 mm Xience stent Dr. Tamala Julian  To LAD   . Cancer ([place])   . Chronic kidney disease   . Chronic systolic CHF (congestive heart failure) (HCC)    ECHO 08/05/13  EF 30%  Anterior akinesis and inferior hypokinesis   . COPD (chronic obstructive pulmonary disease) ([place])   . Hyperlipidemia   . Mass of colon 04/2016  .  Myocardial infarction ([place]) 04/29/2013  . Thrombocytopenia ([place]) 12/27/2014   Chronic      Past Surgical History:  Procedure Laterality Date  . CARDIAC CATHETERIZATION  05/2013   . CARDIAC CATHETERIZATION N/A 12/28/2014   Procedure: Left Heart Cath and Coronary Angiography;  Surgeon: [name], MD;  Location: [facility] LAB;  Service: Cardiovascular;  Laterality: N/A;  . CARDIAC CATHETERIZATION N/A 12/28/2014   Procedure: Coronary Stent Intervention;  Surgeon:  [name], MD;  Location: [facility] CV LAB;  Service: Cardiovascular;  Laterality: N/A;  . COLECTOMY WITH COLOSTOMY CREATION/HARTMANN PROCEDURE Left 05/18/2016   Procedure: COLECTOMY WITH OSTOMY CREATION/HARTMANN PROCEDURE;  Surgeon: [name], MD;  Location: [facility];  Service: General;  Laterality: Left;  . COLONOSCOPY WITH PROPOFOL N/A 04/01/2017   Procedure: COLONOSCOPY WITH PROPOFOL;  Surgeon: [name], MD;  Location: [facility];  Service: Gastroenterology;  Laterality: N/A;  . CORONARY ANGIOPLASTY  05/2013   . FLEXIBLE SIGMOIDOSCOPY N/A 05/16/2016   Procedure: FLEXIBLE SIGMOIDOSCOPY;  Surgeon: [name], MD;  Location: [facility] [place];  Service: Gastroenterology;  Laterality: N/A;  . ICD placement  09/07/2013   . IMPLANTABLE CARDIOVERTER DEFIBRILLATOR IMPLANT N/A 09/07/2013   Procedure: IMPLANTABLE CARDIOVERTER DEFIBRILLATOR IMPLANT;  Surgeon: [name], MD;  Location: [facility] CATH LAB;  Service: Cardiovascular;  Laterality: N/A;  . INTRA-AORTIC BALLOON PUMP INSERTION  06/20/2013   Procedure: INTRA-AORTIC BALLOON PUMP INSERTION;  Surgeon: Sinclair Grooms, MD;  Location: [facility] CATH LAB;  Service: Cardiovascular;;  . LEFT HEART CATHETERIZATION WITH CORONARY ANGIOGRAM N/A 06/20/2013   Procedure: LEFT HEART CATHETERIZATION WITH CORONARY ANGIOGRAM;  Surgeon: Sinclair Grooms, MD;  Location: [facility] CATH LAB;  Service: Cardiovascular;  Laterality: N/A;  . PERCUTANEOUS CORONARY STENT INTERVENTION (PCI-S)  06/20/2013   Procedure: PERCUTANEOUS CORONARY STENT INTERVENTION (PCI-S);  Surgeon: Sinclair Grooms, MD;  Location: [facility] CATH LAB;  Service: Cardiovascular;;   Medications include: ASA 81 mg, Lipitor, carvedilol, Plavix, digoxin, iron, lisinopril.  Patient hold Plavix 5 days before surgery.  If no changes, I anticipate pt can proceed with surgery as scheduled.   Willeen Cass, FNP-BC [facility] Short Stay Surgical Center/Anesthesiology Phone: [phone] 05/14/2017 2:29  PM

## 2017-05-15 ENCOUNTER — Other Ambulatory Visit: Payer: Self-pay

## 2017-05-15 ENCOUNTER — Ambulatory Visit: Payer: Self-pay | Admitting: Hematology

## 2017-05-15 DIAGNOSIS — K091 Developmental (nonodontogenic) cysts of oral region: Secondary | ICD-10-CM | POA: Diagnosis not present

## 2017-05-20 ENCOUNTER — Encounter (HOSPITAL_COMMUNITY): Payer: Self-pay | Admitting: Surgery

## 2017-05-20 ENCOUNTER — Encounter (HOSPITAL_COMMUNITY)
Admission: RE | Disposition: A | Discharge status: Home / Self Care | Payer: Self-pay | Source: Ambulatory Visit | Attending: General Surgery

## 2017-05-20 ENCOUNTER — Inpatient Hospital Stay (HOSPITAL_COMMUNITY): Payer: Medicare Other | Admitting: Certified Registered Nurse Anesthetist

## 2017-05-20 ENCOUNTER — Inpatient Hospital Stay (HOSPITAL_COMMUNITY)
Admission: RE | Admit: 2017-05-20 | Discharge: 2017-05-27 | DRG: 331 | Disposition: A | Discharge status: Home / Self Care | Payer: Medicare Other | Source: Ambulatory Visit | Attending: General Surgery | Admitting: General Surgery

## 2017-05-20 ENCOUNTER — Other Ambulatory Visit: Payer: Self-pay

## 2017-05-20 ENCOUNTER — Inpatient Hospital Stay (HOSPITAL_COMMUNITY): Payer: Medicare Other | Admitting: Emergency Medicine

## 2017-05-20 DIAGNOSIS — N183 Chronic kidney disease, stage 3 (moderate): Secondary | ICD-10-CM | POA: Diagnosis present

## 2017-05-20 DIAGNOSIS — Z79899 Other long term (current) drug therapy: Secondary | ICD-10-CM

## 2017-05-20 DIAGNOSIS — Z955 Presence of coronary angioplasty implant and graft: Secondary | ICD-10-CM | POA: Diagnosis not present

## 2017-05-20 DIAGNOSIS — Z9221 Personal history of antineoplastic chemotherapy: Secondary | ICD-10-CM | POA: Diagnosis not present

## 2017-05-20 DIAGNOSIS — Z7982 Long term (current) use of aspirin: Secondary | ICD-10-CM | POA: Diagnosis not present

## 2017-05-20 DIAGNOSIS — K660 Peritoneal adhesions (postprocedural) (postinfection): Secondary | ICD-10-CM | POA: Diagnosis present

## 2017-05-20 DIAGNOSIS — Z91041 Radiographic dye allergy status: Secondary | ICD-10-CM | POA: Diagnosis not present

## 2017-05-20 DIAGNOSIS — D696 Thrombocytopenia, unspecified: Secondary | ICD-10-CM | POA: Diagnosis present

## 2017-05-20 DIAGNOSIS — Z888 Allergy status to other drugs, medicaments and biological substances status: Secondary | ICD-10-CM | POA: Diagnosis not present

## 2017-05-20 DIAGNOSIS — Z87891 Personal history of nicotine dependence: Secondary | ICD-10-CM | POA: Diagnosis not present

## 2017-05-20 DIAGNOSIS — I110 Hypertensive heart disease with heart failure: Secondary | ICD-10-CM | POA: Diagnosis not present

## 2017-05-20 DIAGNOSIS — I10 Essential (primary) hypertension: Secondary | ICD-10-CM | POA: Diagnosis not present

## 2017-05-20 DIAGNOSIS — K432 Incisional hernia without obstruction or gangrene: Secondary | ICD-10-CM | POA: Diagnosis present

## 2017-05-20 DIAGNOSIS — E860 Dehydration: Secondary | ICD-10-CM | POA: Diagnosis present

## 2017-05-20 DIAGNOSIS — I129 Hypertensive chronic kidney disease with stage 1 through stage 4 chronic kidney disease, or unspecified chronic kidney disease: Secondary | ICD-10-CM | POA: Diagnosis present

## 2017-05-20 DIAGNOSIS — I2510 Atherosclerotic heart disease of native coronary artery without angina pectoris: Secondary | ICD-10-CM | POA: Diagnosis present

## 2017-05-20 DIAGNOSIS — I252 Old myocardial infarction: Secondary | ICD-10-CM

## 2017-05-20 DIAGNOSIS — Z433 Encounter for attention to colostomy: Principal | ICD-10-CM

## 2017-05-20 DIAGNOSIS — Z933 Colostomy status: Secondary | ICD-10-CM | POA: Diagnosis not present

## 2017-05-20 DIAGNOSIS — F419 Anxiety disorder, unspecified: Secondary | ICD-10-CM | POA: Diagnosis present

## 2017-05-20 DIAGNOSIS — I5022 Chronic systolic (congestive) heart failure: Secondary | ICD-10-CM | POA: Diagnosis not present

## 2017-05-20 DIAGNOSIS — Z9889 Other specified postprocedural states: Secondary | ICD-10-CM

## 2017-05-20 DIAGNOSIS — Z85038 Personal history of other malignant neoplasm of large intestine: Secondary | ICD-10-CM | POA: Diagnosis not present

## 2017-05-20 HISTORY — DX: Other specified postprocedural states: Z98.890

## 2017-05-20 HISTORY — PX: COLOSTOMY TAKEDOWN: SHX5783

## 2017-05-20 LAB — CREATININE, SERUM
Creatinine, Ser: 1.84 mg/dL — ABNORMAL HIGH (ref 0.61–1.24)
GFR calc non Af Amer: 40 mL/min — ABNORMAL LOW (ref >60)
GFR, EST AFRICAN AMERICAN: 46 mL/min — AB (ref >60)

## 2017-05-20 LAB — CBC
HEMATOCRIT: 46 % (ref 39.0–52.0)
Hemoglobin: 15.8 g/dL (ref 13.0–17.0)
MCH: 31.9 pg (ref 26.0–34.0)
MCHC: 34.3 g/dL (ref 30.0–36.0)
MCV: 92.7 fL (ref 78.0–100.0)
Platelets: 139 10*3/uL — ABNORMAL LOW (ref 150–400)
RBC: 4.96 MIL/uL (ref 4.22–5.81)
RDW: 14.1 % (ref 11.5–15.5)
WBC: 16.2 10*3/uL — AB (ref 4.0–10.5)

## 2017-05-20 SURGERY — CLOSURE, COLOSTOMY
Anesthesia: General | Site: Abdomen

## 2017-05-20 MED ORDER — DEXAMETHASONE SODIUM PHOSPHATE 10 MG/ML IJ SOLN: INTRAMUSCULAR | Status: DC | PRN

## 2017-05-20 MED ORDER — PHENYLEPHRINE HCL 10 MG/ML IJ SOLN
INTRAVENOUS | Status: DC | PRN
  Administered 2017-05-20: 25 ug/min via INTRAVENOUS

## 2017-05-20 MED ORDER — NALOXONE HCL 0.4 MG/ML IJ SOLN: 0.4000 mg | INTRAMUSCULAR | Status: DC | PRN

## 2017-05-20 MED ORDER — OXYCODONE HCL 5 MG PO TABS: 5.0000 mg | ORAL_TABLET | Freq: Once | ORAL | Status: DC | PRN

## 2017-05-20 MED ORDER — SUGAMMADEX SODIUM 200 MG/2ML IV SOLN
INTRAVENOUS | Status: DC | PRN
  Administered 2017-05-20: 300 mg via INTRAVENOUS

## 2017-05-20 MED ORDER — LORAZEPAM 2 MG/ML IJ SOLN
1.0000 mg | INTRAMUSCULAR | Status: DC | PRN
  Administered 2017-05-22 – 2017-05-24 (×3): 1 mg via INTRAVENOUS
  Filled 2017-05-20 (×3): qty 1

## 2017-05-20 MED ORDER — HYDROMORPHONE HCL 2 MG/ML IJ SOLN: 0.2500 mg | INTRAMUSCULAR | Status: DC | PRN

## 2017-05-20 MED ORDER — CEFOTETAN DISODIUM-DEXTROSE 2-2.08 GM-%(50ML) IV SOLR
2.0000 g | INTRAVENOUS | Status: AC
  Administered 2017-05-20: 2 g via INTRAVENOUS
  Filled 2017-05-20: qty 50

## 2017-05-20 MED ORDER — PROPOFOL 10 MG/ML IV BOLUS
INTRAVENOUS | Status: DC | PRN
  Administered 2017-05-20: 110 mg via INTRAVENOUS

## 2017-05-20 MED ORDER — ONDANSETRON HCL 4 MG/2ML IJ SOLN
INTRAMUSCULAR | Status: AC
  Filled 2017-05-20: qty 2

## 2017-05-20 MED ORDER — DIPHENHYDRAMINE HCL 12.5 MG/5ML PO ELIX: 12.5000 mg | ORAL_SOLUTION | Freq: Four times a day (QID) | ORAL | Status: DC | PRN

## 2017-05-20 MED ORDER — FENTANYL CITRATE (PF) 250 MCG/5ML IJ SOLN
INTRAMUSCULAR | Status: AC
  Filled 2017-05-20: qty 5

## 2017-05-20 MED ORDER — SUGAMMADEX SODIUM 200 MG/2ML IV SOLN
INTRAVENOUS | Status: AC
Start: 2017-05-20 — End: 2017-05-20
  Filled 2017-05-20: qty 2

## 2017-05-20 MED ORDER — ACETAMINOPHEN 500 MG PO TABS
1000.0000 mg | ORAL_TABLET | ORAL | Status: AC
  Administered 2017-05-20: 1000 mg via ORAL
  Filled 2017-05-20: qty 2

## 2017-05-20 MED ORDER — SUGAMMADEX SODIUM 200 MG/2ML IV SOLN
INTRAVENOUS | Status: AC
  Filled 2017-05-20: qty 2

## 2017-05-20 MED ORDER — METOPROLOL TARTRATE 5 MG/5ML IV SOLN: 5.0000 mg | Freq: Four times a day (QID) | INTRAVENOUS | Status: DC | PRN

## 2017-05-20 MED ORDER — 0.9 % SODIUM CHLORIDE (POUR BTL) OPTIME
TOPICAL | Status: DC | PRN
  Administered 2017-05-20 (×4): 1000 mL

## 2017-05-20 MED ORDER — FENTANYL CITRATE (PF) 100 MCG/2ML IJ SOLN
INTRAMUSCULAR | Status: DC | PRN
  Administered 2017-05-20: 50 ug via INTRAVENOUS
  Administered 2017-05-20: 100 ug via INTRAVENOUS
  Administered 2017-05-20: 25 ug via INTRAVENOUS
  Administered 2017-05-20: 50 ug via INTRAVENOUS
  Administered 2017-05-20: 100 ug via INTRAVENOUS
  Administered 2017-05-20: 50 ug via INTRAVENOUS
  Administered 2017-05-20: 25 ug via INTRAVENOUS

## 2017-05-20 MED ORDER — ALBUMIN HUMAN 5 % IV SOLN
INTRAVENOUS | Status: DC | PRN
  Administered 2017-05-20: 14:00:00 via INTRAVENOUS

## 2017-05-20 MED ORDER — ALVIMOPAN 12 MG PO CAPS
12.0000 mg | ORAL_CAPSULE | ORAL | Status: AC
  Administered 2017-05-20: 12 mg via ORAL
  Filled 2017-05-20: qty 1

## 2017-05-20 MED ORDER — DIGOXIN 125 MCG PO TABS
0.1250 mg | ORAL_TABLET | Freq: Every day | ORAL | Status: DC
  Administered 2017-05-21 – 2017-05-27 (×7): 0.125 mg via ORAL
  Filled 2017-05-20 (×7): qty 1

## 2017-05-20 MED ORDER — LIDOCAINE 2% (20 MG/ML) 5 ML SYRINGE
INTRAMUSCULAR | Status: DC | PRN
  Administered 2017-05-20: 80 mg via INTRAVENOUS

## 2017-05-20 MED ORDER — HYDROMORPHONE 1 MG/ML IV SOLN
INTRAVENOUS | Status: AC
  Filled 2017-05-20: qty 25

## 2017-05-20 MED ORDER — MIDAZOLAM HCL 2 MG/2ML IJ SOLN
INTRAMUSCULAR | Status: DC | PRN
  Administered 2017-05-20: 2 mg via INTRAVENOUS

## 2017-05-20 MED ORDER — LIDOCAINE 2% (20 MG/ML) 5 ML SYRINGE
INTRAMUSCULAR | Status: AC
  Filled 2017-05-20: qty 10

## 2017-05-20 MED ORDER — MIDAZOLAM HCL 2 MG/2ML IJ SOLN
INTRAMUSCULAR | Status: AC
  Filled 2017-05-20: qty 2

## 2017-05-20 MED ORDER — GABAPENTIN 300 MG PO CAPS
300.0000 mg | ORAL_CAPSULE | Freq: Two times a day (BID) | ORAL | Status: DC
  Administered 2017-05-20 – 2017-05-24 (×6): 300 mg via ORAL
  Filled 2017-05-20 (×9): qty 1

## 2017-05-20 MED ORDER — NITROGLYCERIN 0.4 MG SL SUBL: 0.4000 mg | SUBLINGUAL_TABLET | SUBLINGUAL | Status: DC | PRN

## 2017-05-20 MED ORDER — OXYCODONE HCL 5 MG/5ML PO SOLN: 5.0000 mg | Freq: Once | ORAL | Status: DC | PRN

## 2017-05-20 MED ORDER — ASPIRIN 81 MG PO CHEW
81.0000 mg | CHEWABLE_TABLET | Freq: Every day | ORAL | Status: DC
  Administered 2017-05-20 – 2017-05-27 (×8): 81 mg via ORAL
  Filled 2017-05-20 (×8): qty 1

## 2017-05-20 MED ORDER — ROCURONIUM BROMIDE 100 MG/10ML IV SOLN
INTRAVENOUS | Status: DC | PRN
  Administered 2017-05-20: 10 mg via INTRAVENOUS
  Administered 2017-05-20: 50 mg via INTRAVENOUS
  Administered 2017-05-20: 10 mg via INTRAVENOUS
  Administered 2017-05-20: 20 mg via INTRAVENOUS
  Administered 2017-05-20: 10 mg via INTRAVENOUS

## 2017-05-20 MED ORDER — SODIUM CHLORIDE 0.9% FLUSH: 9.0000 mL | INTRAVENOUS | Status: DC | PRN

## 2017-05-20 MED ORDER — DIPHENHYDRAMINE HCL 50 MG/ML IJ SOLN
12.5000 mg | Freq: Four times a day (QID) | INTRAMUSCULAR | Status: DC | PRN
  Administered 2017-05-23: 12.5 mg via INTRAVENOUS
  Filled 2017-05-20: qty 1

## 2017-05-20 MED ORDER — ROCURONIUM BROMIDE 10 MG/ML (PF) SYRINGE
PREFILLED_SYRINGE | INTRAVENOUS | Status: AC
  Filled 2017-05-20: qty 10

## 2017-05-20 MED ORDER — HYDROMORPHONE 1 MG/ML IV SOLN
INTRAVENOUS | Status: DC
  Administered 2017-05-20: 2.4 mg via INTRAVENOUS
  Administered 2017-05-20: 16:00:00 via INTRAVENOUS
  Administered 2017-05-21: 1.5 mg via INTRAVENOUS
  Administered 2017-05-21: 0.6 mg via INTRAVENOUS
  Administered 2017-05-21: 1.2 mg via INTRAVENOUS
  Administered 2017-05-22: 0 mg via INTRAVENOUS
  Administered 2017-05-22: 0.3 mg via INTRAVENOUS

## 2017-05-20 MED ORDER — GABAPENTIN 300 MG PO CAPS
300.0000 mg | ORAL_CAPSULE | ORAL | Status: AC
  Administered 2017-05-20: 300 mg via ORAL
  Filled 2017-05-20: qty 1

## 2017-05-20 MED ORDER — PROMETHAZINE HCL 25 MG/ML IJ SOLN: 6.2500 mg | INTRAMUSCULAR | Status: DC | PRN

## 2017-05-20 MED ORDER — ROCURONIUM BROMIDE 10 MG/ML (PF) SYRINGE
PREFILLED_SYRINGE | INTRAVENOUS | Status: AC
  Filled 2017-05-20: qty 5

## 2017-05-20 MED ORDER — EPHEDRINE SULFATE-NACL 50-0.9 MG/10ML-% IV SOSY
PREFILLED_SYRINGE | INTRAVENOUS | Status: DC | PRN
  Administered 2017-05-20: 10 mg via INTRAVENOUS

## 2017-05-20 MED ORDER — PANTOPRAZOLE SODIUM 40 MG PO TBEC
40.0000 mg | DELAYED_RELEASE_TABLET | Freq: Every day | ORAL | Status: DC
  Administered 2017-05-21 – 2017-05-27 (×4): 40 mg via ORAL
  Filled 2017-05-20 (×7): qty 1

## 2017-05-20 MED ORDER — LISINOPRIL 5 MG PO TABS
2.5000 mg | ORAL_TABLET | Freq: Every day | ORAL | Status: DC
  Administered 2017-05-20: 2.5 mg via ORAL
  Filled 2017-05-20: qty 1

## 2017-05-20 MED ORDER — KCL IN DEXTROSE-NACL 20-5-0.45 MEQ/L-%-% IV SOLN
INTRAVENOUS | Status: DC
  Administered 2017-05-20 – 2017-05-21 (×2): via INTRAVENOUS
  Filled 2017-05-20 (×2): qty 1000

## 2017-05-20 MED ORDER — CARVEDILOL 3.125 MG PO TABS
3.1250 mg | ORAL_TABLET | Freq: Two times a day (BID) | ORAL | Status: DC
  Administered 2017-05-20 – 2017-05-27 (×13): 3.125 mg via ORAL
  Filled 2017-05-20 (×15): qty 1

## 2017-05-20 MED ORDER — ONDANSETRON HCL 4 MG/2ML IJ SOLN
INTRAMUSCULAR | Status: DC | PRN
  Administered 2017-05-20: 4 mg via INTRAVENOUS

## 2017-05-20 MED ORDER — ENOXAPARIN SODIUM 40 MG/0.4ML ~~LOC~~ SOLN
40.0000 mg | SUBCUTANEOUS | Status: DC
  Administered 2017-05-21: 40 mg via SUBCUTANEOUS
  Filled 2017-05-20: qty 0.4

## 2017-05-20 MED ORDER — ONDANSETRON HCL 4 MG/2ML IJ SOLN
4.0000 mg | Freq: Four times a day (QID) | INTRAMUSCULAR | Status: DC | PRN
  Administered 2017-05-21 (×2): 4 mg via INTRAVENOUS
  Filled 2017-05-20 (×2): qty 2

## 2017-05-20 MED ORDER — LACTATED RINGERS IV SOLN
INTRAVENOUS | Status: DC
  Administered 2017-05-20: 13:00:00 via INTRAVENOUS

## 2017-05-20 SURGICAL SUPPLY — 57 items
BLADE CLIPPER SURG (BLADE) IMPLANT
CANISTER SUCT 3000ML PPV (MISCELLANEOUS) ×3 IMPLANT
CHLORAPREP W/TINT 26ML (MISCELLANEOUS) ×3 IMPLANT
COVER MAYO STAND STRL (DRAPES) ×3 IMPLANT
COVER SURGICAL LIGHT HANDLE (MISCELLANEOUS) ×6 IMPLANT
DRAPE HALF SHEET 40X57 (DRAPES) ×6 IMPLANT
DRAPE LAPAROSCOPIC ABDOMINAL (DRAPES) ×3 IMPLANT
DRAPE UTILITY XL STRL (DRAPES) ×15 IMPLANT
DRAPE WARM FLUID 44X44 (DRAPE) ×3 IMPLANT
DRSG OPSITE POSTOP 3X4 (GAUZE/BANDAGES/DRESSINGS) ×2 IMPLANT
DRSG OPSITE POSTOP 4X10 (GAUZE/BANDAGES/DRESSINGS) ×2 IMPLANT
DRSG OPSITE POSTOP 4X8 (GAUZE/BANDAGES/DRESSINGS) IMPLANT
ELECT BLADE 6.5 EXT (BLADE) ×3 IMPLANT
ELECT CAUTERY BLADE 6.4 (BLADE) ×6 IMPLANT
ELECT REM PT RETURN 9FT ADLT (ELECTROSURGICAL) ×3
ELECTRODE REM PT RTRN 9FT ADLT (ELECTROSURGICAL) ×1 IMPLANT
GLOVE BIO SURGEON STRL SZ8 (GLOVE) ×6 IMPLANT
GLOVE BIOGEL PI IND STRL 8 (GLOVE) ×2 IMPLANT
GLOVE BIOGEL PI INDICATOR 8 (GLOVE) ×4
GOWN STRL REUS W/ TWL LRG LVL3 (GOWN DISPOSABLE) ×4 IMPLANT
GOWN STRL REUS W/ TWL XL LVL3 (GOWN DISPOSABLE) ×2 IMPLANT
GOWN STRL REUS W/TWL LRG LVL3 (GOWN DISPOSABLE) ×12
GOWN STRL REUS W/TWL XL LVL3 (GOWN DISPOSABLE) ×6
KIT BASIN OR (CUSTOM PROCEDURE TRAY) ×3 IMPLANT
KIT TURNOVER KIT B (KITS) ×3 IMPLANT
LEGGING LITHOTOMY PAIR STRL (DRAPES) ×3 IMPLANT
LIGASURE IMPACT 36 18CM CVD LR (INSTRUMENTS) ×2 IMPLANT
NS IRRIG 1000ML POUR BTL (IV SOLUTION) ×6 IMPLANT
PACK GENERAL/GYN (CUSTOM PROCEDURE TRAY) ×3 IMPLANT
PAD ARMBOARD 7.5X6 YLW CONV (MISCELLANEOUS) ×3 IMPLANT
PENCIL BUTTON HOLSTER BLD 10FT (ELECTRODE) ×3 IMPLANT
RELOAD PROXIMATE 75MM BLUE (ENDOMECHANICALS) ×6 IMPLANT
RELOAD STAPLE 75 3.8 BLU REG (ENDOMECHANICALS) IMPLANT
SPECIMEN JAR MEDIUM (MISCELLANEOUS) IMPLANT
SPONGE LAP 18X18 X RAY DECT (DISPOSABLE) IMPLANT
STAPLER GUN LINEAR PROX 60 (STAPLE) ×2 IMPLANT
STAPLER PROXIMATE 75MM BLUE (STAPLE) ×2 IMPLANT
STAPLER VISISTAT 35W (STAPLE) ×3 IMPLANT
SUCTION POOLE TIP (SUCTIONS) ×3 IMPLANT
SURGILUBE 2OZ TUBE FLIPTOP (MISCELLANEOUS) IMPLANT
SUT PDS AB 1 CT  36 (SUTURE) ×4
SUT PDS AB 1 CT 36 (SUTURE) ×2 IMPLANT
SUT PDS AB 1 TP1 96 (SUTURE) ×6 IMPLANT
SUT PROLENE 2 0 CT2 30 (SUTURE) IMPLANT
SUT PROLENE 2 0 KS (SUTURE) IMPLANT
SUT SILK 2 0 SH CR/8 (SUTURE) ×3 IMPLANT
SUT SILK 2 0 TIES 10X30 (SUTURE) ×3 IMPLANT
SUT SILK 3 0 SH CR/8 (SUTURE) ×3 IMPLANT
SUT SILK 3 0 TIES 10X30 (SUTURE) ×3 IMPLANT
SYR BULB IRRIGATION 50ML (SYRINGE) ×3 IMPLANT
TOWEL OR 17X26 10 PK STRL BLUE (TOWEL DISPOSABLE) ×6 IMPLANT
TRAY FOLEY W/METER SILVER 16FR (SET/KITS/TRAYS/PACK) ×3 IMPLANT
TRAY PROCTOSCOPIC FIBER OPTIC (SET/KITS/TRAYS/PACK) IMPLANT
TUBE CONNECTING 12'X1/4 (SUCTIONS) ×1
TUBE CONNECTING 12X1/4 (SUCTIONS) ×2 IMPLANT
UNDERPAD 30X30 (UNDERPADS AND DIAPERS) IMPLANT
YANKAUER SUCT BULB TIP NO VENT (SUCTIONS) ×6 IMPLANT

## 2017-05-20 NOTE — Anesthesia Procedure Notes (Signed)
Procedure Name: Intubation Date/Time: 05/20/2017 12:50 PM Performed by: Leonor Liv, CRNA Pre-anesthesia Checklist: Patient identified, Emergency Drugs available, Suction available and Patient being monitored Patient Re-evaluated:Patient Re-evaluated prior to induction Oxygen Delivery Method: Circle System Utilized Preoxygenation: Pre-oxygenation with 100% oxygen Induction Type: IV induction Ventilation: Mask ventilation without difficulty Laryngoscope Size: Mac and 4 Grade View: Grade I Tube type: Oral Tube size: 7.5 mm Number of attempts: 1 Airway Equipment and Method: Stylet and Oral airway Placement Confirmation: ETT inserted through vocal cords under direct vision,  positive ETCO2 and breath sounds checked- equal and bilateral Secured at: 23 cm Tube secured with: Tape Dental Injury: Teeth and Oropharynx as per pre-operative assessment

## 2017-05-20 NOTE — H&P (Signed)
[name] Documented: 03/27/2017 8:45 AM Location: [facility] Patient #: [id number] DOB: 1960/02/25 Single / Language: Christopher [name] / Race: White Male   History of Present Illness Christopher Neri E. [name] MD; 03/27/2017 9:00 AM) The patient is a 57 year old male presenting for a post-operative visit. Purvis presents for follow-up status post L colectomy with colostomy for obstructing left colon cancer and April 2018. He was able to tolerate some chemotherapy and has completed this. He had difficulties during that treatment. We are planning consideration of colostomy takedown. He is undergoing a colonoscopy next week by Dr. Loletha Carrow. He has significant cardiac history and is followed by Dr. Wynonia Lawman. He has been feeling well. He has gained some weight.   Allergies (Tanisha A. Owens Shark, [place]; 03/27/2017 8:46 AM) CELECOXIB  Iodinated Diagnostic Agents  Allergies Reconciled   Medication History (Tanisha A. Owens Shark, RMA; 03/27/2017 8:46 AM) Clopidogrel Bisulfate (75MG  Tablet, Oral) Active. Multivitamin Adult (Oral) Active. Carvedilol (3.125MG  Tablet, Oral) Active. Nitroglycerin (0.4MG  Tab Sublingual, Sublingual) Active. Saccharomyces boulardii (250MG  Capsule, Oral) Active. Ferrous Sulfate (325 (65 Fe)MG Tablet, Oral) Active. Atorvastatin Calcium (80MG  Tablet, Oral) Active. Aspirin (81MG  Tablet Chewable, Oral) Active. Digoxin (125MCG Tablet, Oral) Active. Lisinopril (2.5MG  Tablet, Oral) Active. Medications Reconciled  Vitals (Tanisha A. Brown RMA; 03/27/2017 8:46 AM) 03/27/2017 8:46 AM Weight: 181.4 lb Height: 68in Body Surface Area: 1.96 m Body Mass Index: 27.58 kg/m  Temp.: 97.89F  Pulse: 86 (Regular)  BP: 118/76 (Sitting, Left Arm, Standard)       Physical Exam (Katia Hannen E. [name] MD; 03/27/2017 9:02 AM) General Note: No distress   Integumentary Note: Warm and dry   Eye Note: Pupils equal and reactive, extraocular muscles  intact   ENMT Note: Oral mucosa is moist   Chest and Lung Exam Note: Clear to auscultation, pacemaker left upper chest   Cardiovascular Note: Regular rate and rhythm, distal pulses are present   Abdomen Note: Soft, nontender, incision well healed, incisional hernia involving the upper portion of his incision easily reduces but is large, left-sided ostomy is pink with stool output   Neurologic Note: Alert, speech fluent, normal gait   Neuropsychiatric Note: Anxious     Assessment & Plan Christopher Neri E. [name] MD; 03/27/2017 9:04 AM) STATUS POST PARTIAL COLECTOMY (Z90.49) Impression: He is undergoing colonoscopy next week by Dr. Loletha Carrow. Based on those results, we will plan colostomy takedown. Additionally, we will obtain preoperative clearance from Dr. Wynonia Lawman. Once his colonoscopy is completed, I will call Kimmy and discuss surgery further. We talked about colostomy takedown and repair of his incisional hernia at length including the procedure, risks, benefits, and expected postoperative course. He is anxious about undergoing surgery but feels he will likely want to do it. COLON CANCER (C18.9) INCISIONAL HERNIA WITHOUT OBSTRUCTION OR GANGRENE (K43.2) Impression: Plan repair at time of colostomy takedown.   05/20/17 Update: Cardiac clearance received from Dr. Wynonia Lawman He tolerated his prep No changes in exam  [name], MD, MPH, [place] Trauma: [phone] General Surgery: [phone]

## 2017-05-20 NOTE — Transfer of Care (Signed)
Immediate Anesthesia Transfer of Care Note  Patient: Christopher [name]  Procedure(s) Performed: COLOSTOMY TAKEDOWN (N/A Abdomen)  Patient Location: PACU  Anesthesia Type:General  Level of Consciousness: awake, alert  and oriented  Airway & Oxygen Therapy: Patient Spontanous Breathing and Patient connected to nasal cannula oxygen  Post-op Assessment: Report given to RN, Post -op Vital signs reviewed and stable and Patient moving all extremities  Post vital signs: Reviewed and stable  Last Vitals:  Vitals Value Taken Time  BP 107/86 05/20/2017  3:41 PM  Temp 36.6 C 05/20/2017  3:40 PM  Pulse 87 05/20/2017  3:43 PM  Resp 19 05/20/2017  3:43 PM  SpO2 96 % 05/20/2017  3:43 PM  Vitals shown include unvalidated device data.  Last Pain:  Vitals:   05/20/17 1104  TempSrc:   PainSc: 0-No pain      Patients Stated Pain Goal: 3 (34/28/76 8115)  Complications: No apparent anesthesia complications

## 2017-05-20 NOTE — Anesthesia Preprocedure Evaluation (Signed)
Anesthesia Evaluation  Patient identified by MRN, date of birth, ID band Patient awake    Reviewed: Allergy & Precautions, H&P , NPO status , [name] Chart, lab work & pertinent test results, reviewed documented beta blocker date and time   Airway Mallampati: II  TM Distance: >3 FB Neck ROM: Full    Dental no notable dental hx. (+) Teeth Intact, Dental Advisory Given   Pulmonary COPD, former smoker,    Pulmonary exam normal breath sounds clear to auscultation       Cardiovascular + CAD, + Past MI, + Cardiac Stents and +CHF  + Cardiac Defibrillator  Rhythm:Regular Rate:Normal     Neuro/Psych Anxiety negative neurological ROS  negative psych ROS   GI/Hepatic negative GI ROS, Neg liver ROS,   Endo/Other  negative endocrine ROS  Renal/GU Renal InsufficiencyRenal diseasenegative Renal ROS  negative genitourinary   Musculoskeletal   Abdominal   Peds  Hematology negative hematology ROS (+) anemia ,   Anesthesia Other Findings   Reproductive/Obstetrics negative OB ROS                             Anesthesia Physical  Anesthesia Plan  ASA: IV  Anesthesia Plan: General   Post-op Pain Management:    Induction: Intravenous  PONV Risk Score and Plan: 2 and Ondansetron, Midazolam and Treatment may vary due to age or medical condition  Airway Management Planned: Oral ETT  Additional Equipment: Arterial line  Intra-op Plan:   Post-operative Plan: Extubation in OR  Informed Consent: I have reviewed the patients History and Physical, chart, labs and discussed the procedure including the risks, benefits and alternatives for the proposed anesthesia with the patient or authorized representative who has indicated his/her understanding and acceptance.   Dental advisory given  Plan Discussed with: CRNA  Anesthesia Plan Comments:         Anesthesia Quick Evaluation

## 2017-05-20 NOTE — Progress Notes (Signed)
1635 Received pt from PACU. Alert oriented x4. Pain controlled by PCA. Resting comfortably in bed. [name] brother at bedside. Will continue to monitor.

## 2017-05-20 NOTE — Interval H&P Note (Signed)
History and Physical Interval Note:  05/20/2017 11:53 AM  Christopher [name]  has presented today for surgery, with the diagnosis of colostomy in place  The various methods of treatment have been discussed with the patient and family. After consideration of risks, benefits and other options for treatment, the patient has consented to  Procedure(s): COLOSTOMY TAKEDOWN ERAS PATHWAY (N/A) as a surgical intervention .  The patient's history has been reviewed, patient examined, no change in status, stable for surgery.  I have reviewed the patient's chart and labs.  Questions were answered to the patient's satisfaction.     Zenovia Jarred

## 2017-05-20 NOTE — Anesthesia Postprocedure Evaluation (Signed)
Anesthesia Post Note  Patient: Christopher [name]  Procedure(s) Performed: COLOSTOMY TAKEDOWN (N/A Abdomen)     Patient location during evaluation: PACU Anesthesia Type: General Level of consciousness: awake and alert Pain management: pain level controlled Vital Signs Assessment: post-procedure vital signs reviewed and stable Respiratory status: spontaneous breathing, nonlabored ventilation and respiratory function stable Cardiovascular status: blood pressure returned to baseline and stable Postop Assessment: no apparent nausea or vomiting Anesthetic complications: no    Last Vitals:  Vitals:   05/20/17 1630 05/20/17 1635  BP: 100/82   Pulse: 80 84  Resp: 20 16  Temp:  (!) 36.4 C  SpO2: 95% 95%    Last Pain:  Vitals:   05/20/17 1600  TempSrc:   PainSc: 3     LLE Motor Response: Purposeful movement;Responds to commands (05/20/17 1635) LLE Sensation: Full sensation (05/20/17 1635) RLE Motor Response: Purposeful movement;Responds to commands (05/20/17 1635) RLE Sensation: Full sensation (05/20/17 1635)      Lynda Rainwater

## 2017-05-20 NOTE — Op Note (Signed)
05/20/2017  3:02 PM  PATIENT:  Christopher [name]  57 y.o. male  PRE-OPERATIVE DIAGNOSIS:  Colostomy in place  POST-OPERATIVE DIAGNOSIS:  Colostomy in place  PROCEDURE:  Procedure(s): LYSIS OF ADHESIONS 40MIN COLOSTOMY TAKEDOWN  SURGEON:  [name], MD  ASSISTANTS: Armandina Gemma, MD   ANESTHESIA:   general  EBL:  Total I/O In: 2423 [I.V.:800; IV Piggyback:250] Out: 560 [Urine:110; Other:350; Blood:100]  BLOOD ADMINISTERED:none  DRAINS: none   SPECIMEN:  Excision  DISPOSITION OF SPECIMEN:  PATHOLOGY  COUNTS:  YES  DICTATION: .Dragon Dictation Procedure in detail: Christopher [name] presents for colostomy takedown.  He was identified in the preop holding area.  Informed consent was obtained.  He received intravenous antibiotics.  He was brought to the operating room and general endotracheal anesthesia was administered by the anesthesia staff.  We did a timeout procedure.  I closed his colostomy with running silk suture.  He was placed in lithotomy position and his abdomen and perineum were prepped and draped in sterile fashion.  Midline incision was made.  I excised his old scar in its entirety.  The hernia sac superiorly was entered and excised with the skin.  We then began adhesiolysis which took a total of 40 minutes freeing up the omentum from the anterior abdominal wall and multiple small bowel adhesions to the abdominal wall as well as down into the pelvis.  We then freed up the adhesions around the remaining sigmoid colon and around the colostomy site from the inside.  Once this was accomplished, an elliptical incision was made around the colostomy and it was freed up from the fascia and subcutaneous tissues and brought inside.  The last few centimeters of the colostomy were removed with GIA-75 stapler.  This was sent to pathology.  Towels were placed along the edges of the wound in accordance with the colon protocol.  Next, we made a side-to-side anastomosis with GIA-75 stapler from the  proximal left colon to the sigmoid colon which remains.  He had a lot of sigmoid colon left so this laid nicely without any tension.  The common defect was closed with a TA 60.  An apical stitch of 2-0 silk was placed.  The anastomosis was widely patent and again under no tension.  It was viable.  The abdomen was copiously irrigated with saline.  Hemostasis was ensured.  We then changed all of the instruments drapes gowns and gloves according to the colon protocol.  The ostomy site fascia was closed with running #1 PDS from the inside and outside.  Flaps were made above the edge of the viable fascia at each side of his hernia in the upper part of the midline.  This allowed midline fascia to then be closed with running #1 PDS.  The skin edges were freed up and then closed with staples after irrigating things well.  All counts were correct.  He tolerated the procedure well without apparent complication and was taken recovery in stable condition. PATIENT DISPOSITION:  PACU - hemodynamically stable.   Delay start of Pharmacological VTE agent (>24hrs) due to surgical blood loss or risk of bleeding:  no  [name], MD, MPH, FACS Pager: [phone]  4/22/20193:02 PM

## 2017-05-21 ENCOUNTER — Encounter (HOSPITAL_COMMUNITY): Payer: Self-pay | Admitting: General Surgery

## 2017-05-21 ENCOUNTER — Other Ambulatory Visit: Payer: Self-pay

## 2017-05-21 LAB — CBC
HCT: 44.1 % (ref 39.0–52.0)
Hemoglobin: 14.7 g/dL (ref 13.0–17.0)
MCH: 30.8 pg (ref 26.0–34.0)
MCHC: 33.3 g/dL (ref 30.0–36.0)
MCV: 92.5 fL (ref 78.0–100.0)
PLATELETS: 129 10*3/uL — AB (ref 150–400)
RBC: 4.77 MIL/uL (ref 4.22–5.81)
RDW: 14 % (ref 11.5–15.5)
WBC: 15.5 10*3/uL — ABNORMAL HIGH (ref 4.0–10.5)

## 2017-05-21 LAB — BASIC METABOLIC PANEL
Anion gap: 12 (ref 5–15)
BUN: 30 mg/dL — AB (ref 6–20)
CO2: 18 mmol/L — ABNORMAL LOW (ref 22–32)
Calcium: 8.4 mg/dL — ABNORMAL LOW (ref 8.9–10.3)
Chloride: 103 mmol/L (ref 101–111)
Creatinine, Ser: 2.91 mg/dL — ABNORMAL HIGH (ref 0.61–1.24)
GFR calc Af Amer: 27 mL/min — ABNORMAL LOW (ref >60)
GFR calc non Af Amer: 23 mL/min — ABNORMAL LOW (ref >60)
GLUCOSE: 178 mg/dL — AB (ref 65–99)
POTASSIUM: 5.2 mmol/L — AB (ref 3.5–5.1)
Sodium: 133 mmol/L — ABNORMAL LOW (ref 135–145)

## 2017-05-21 MED ORDER — ALBUMIN HUMAN 5 % IV SOLN
12.5000 g | Freq: Once | INTRAVENOUS | Status: AC
  Administered 2017-05-21: 12.5 g via INTRAVENOUS
  Filled 2017-05-21: qty 250

## 2017-05-21 MED ORDER — ALVIMOPAN 12 MG PO CAPS: 12.0000 mg | ORAL_CAPSULE | Freq: Two times a day (BID) | ORAL | Status: DC

## 2017-05-21 MED ORDER — SODIUM CHLORIDE 0.9 % IV SOLN
INTRAVENOUS | Status: DC
  Administered 2017-05-21: 08:00:00 via INTRAVENOUS
  Filled 2017-05-21 (×2): qty 1000

## 2017-05-21 MED ORDER — SODIUM CHLORIDE 0.9 % IV BOLUS
500.0000 mL | Freq: Once | INTRAVENOUS | Status: AC
  Administered 2017-05-21: 500 mL via INTRAVENOUS

## 2017-05-21 MED ORDER — ALVIMOPAN 12 MG PO CAPS
12.0000 mg | ORAL_CAPSULE | Freq: Two times a day (BID) | ORAL | Status: DC
  Administered 2017-05-21 – 2017-05-22 (×4): 12 mg via ORAL
  Filled 2017-05-21 (×6): qty 1

## 2017-05-21 NOTE — Progress Notes (Signed)
PT Cancellation Note  Patient Details Name: Christopher [name] MRN: [id number] DOB: [birth year]   Cancelled Treatment:    Reason Eval/Treat Not Completed: Patient declined, no reason specified Attempted to see pt in afternoon after pt received pain medications and nausea meds, however, pt continues to refuse. Educated about importance of mobility following surgery and to attempt mobility later in the day. Will follow up as schedule allows.   [name], PT, DPT  Acute Rehabilitation Services  Pager: [phone]  [name] 05/21/2017, 2:16 PM

## 2017-05-21 NOTE — Progress Notes (Addendum)
1 Day Post-Op   Subjective/Chief Complaint: Up in chair, passed a little bloody mucous   Objective: Vital signs in last 24 hours: Temp:  [97.5 F (36.4 C)-98.2 F (36.8 C)] 97.6 F (36.4 C) (04/23 0434) Pulse Rate:  [54-90] 88 (04/23 0434) Resp:  [10-23] 17 (04/23 0434) BP: (86-140)/(65-86) 86/65 (04/23 0434) SpO2:  [92 %-98 %] 98 % (04/23 0434) Weight:  [82.1 kg (181 lb)-84.5 kg (186 lb 4.6 oz)] 84.5 kg (186 lb 4.6 oz) (04/22 1845)    Intake/Output from previous day: 04/22 0701 - 04/23 0700 In: 1240 [I.V.:990; IV Piggyback:250] Out: 660 [Urine:210; Blood:100] Intake/Output this shift: No intake/output data recorded.  General appearance: alert and cooperative Resp: clear to auscultation bilaterally Cardio: regular rate and rhythm GI: soft, quiet, dressing OK Extremities: calves soft  Lab Results:  Recent Labs    05/20/17 1725  WBC 16.2*  HGB 15.8  HCT 46.0  PLT 139*   BMET Recent Labs    05/20/17 1725  CREATININE 1.84*   PT/INR No results for input(s): LABPROT, INR in the last 72 hours. ABG No results for input(s): PHART, HCO3 in the last 72 hours.  Invalid input(s): PCO2, PO2  Studies/Results: No results found.  Anti-infectives: Anti-infectives (From admission, onward)   Start     Dose/Rate Route Frequency Ordered Stop   05/20/17 0629  cefoTEtan in Dextrose 5% (CEFOTAN) IVPB 2 g     2 g Intravenous On call to O.R. 05/20/17 0629 05/20/17 1242      Assessment/Plan: S/P LOA and colostomy takedown 4/22 - Entereg, await bowel function HTN/CAD - home meds, hold lisonopril as BP down, albumin bolus CRI with worsening renal function - Stop Lisinopril, change to 0.9NS, follow closely Anxiety - Ativan PRN FEN - ice and sips, ERAS VTE - Lovenox Dispo - labs P, above  LOS: 1 day    Zenovia Jarred 05/21/2017

## 2017-05-21 NOTE — Progress Notes (Signed)
PT Cancellation Note  Patient Details Name: Christopher [name] MRN: [id number] DOB: Mar 20, 1960   Cancelled Treatment:    Reason Eval/Treat Not Completed: Patient declined, no reason specified Pt reporting "I don't feel like getting up today." RN in room and both RN and PT encouraged pt to move and educated about benefits of moving, however, pt continuing to refuse. Encouraged pt to get up and ambulate at some point during the day. Will follow up as schedule allows.   [name], PT, DPT  Acute Rehabilitation Services  Pager: [phone]   [name] 05/21/2017, 12:33 PM

## 2017-05-21 NOTE — Progress Notes (Signed)
Pt encouraged to ambulate but he is still reluctant to walk. Managed to walk to the bathroom and reports that he passed something. Also says that he thinks he passed mucus, pt flushed toilet before staff could see. Educated not to flush toilet before staff see what he passed. Pt now sitting in chair

## 2017-05-21 NOTE — Progress Notes (Signed)
Pt reluctant to get up and mobilize despite being encouraged several times. Generally very anxious man and he voiced that he will get up with PT today. Has barely slept for not more than 30-45 minutes all night,says he just has a lot of things on his mind. Declines taking any sleep aids at home

## 2017-05-22 LAB — BASIC METABOLIC PANEL
Anion gap: 11 (ref 5–15)
BUN: 39 mg/dL — AB (ref 6–20)
CALCIUM: 8.4 mg/dL — AB (ref 8.9–10.3)
CO2: 22 mmol/L (ref 22–32)
CREATININE: 1.95 mg/dL — AB (ref 0.61–1.24)
Chloride: 103 mmol/L (ref 101–111)
GFR calc Af Amer: 43 mL/min — ABNORMAL LOW (ref >60)
GFR, EST NON AFRICAN AMERICAN: 37 mL/min — AB (ref >60)
GLUCOSE: 118 mg/dL — AB (ref 65–99)
Potassium: 4.6 mmol/L (ref 3.5–5.1)
Sodium: 136 mmol/L (ref 135–145)

## 2017-05-22 LAB — CBC
HCT: 37.5 % — ABNORMAL LOW (ref 39.0–52.0)
Hemoglobin: 12.5 g/dL — ABNORMAL LOW (ref 13.0–17.0)
MCH: 30.9 pg (ref 26.0–34.0)
MCHC: 33.3 g/dL (ref 30.0–36.0)
MCV: 92.6 fL (ref 78.0–100.0)
PLATELETS: 93 10*3/uL — AB (ref 150–400)
RBC: 4.05 MIL/uL — ABNORMAL LOW (ref 4.22–5.81)
RDW: 14.3 % (ref 11.5–15.5)
WBC: 14.2 10*3/uL — AB (ref 4.0–10.5)

## 2017-05-22 MED ORDER — PROMETHAZINE HCL 25 MG/ML IJ SOLN
25.0000 mg | Freq: Four times a day (QID) | INTRAMUSCULAR | Status: DC | PRN
  Administered 2017-05-22: 25 mg via INTRAVENOUS
  Filled 2017-05-22: qty 1

## 2017-05-22 MED ORDER — FENTANYL CITRATE (PF) 100 MCG/2ML IJ SOLN
25.0000 ug | INTRAMUSCULAR | Status: DC | PRN
  Administered 2017-05-23: 50 ug via INTRAVENOUS
  Filled 2017-05-22: qty 2

## 2017-05-22 MED ORDER — SODIUM CHLORIDE 0.9 % IV SOLN
INTRAVENOUS | Status: DC
  Administered 2017-05-22 – 2017-05-27 (×7): via INTRAVENOUS

## 2017-05-22 NOTE — Progress Notes (Signed)
2 Days Post-Op   Subjective/Chief Complaint: Vomited overnight - he thinks the Dilaudid is making him nauseated. Passed small amounts of bloody mucous twice.   Objective: Vital signs in last 24 hours: Temp:  [97.8 F (36.6 C)-98.2 F (36.8 C)] 98 F (36.7 C) (04/24 0530) Pulse Rate:  [77-114] 114 (04/24 0530) Resp:  [14-25] 20 (04/24 0530) BP: (90-122)/(72-92) 118/86 (04/24 0530) SpO2:  [92 %-98 %] 94 % (04/24 0530) Last BM Date: (pt doesn't remember)  Intake/Output from previous day: 04/23 0701 - 04/24 0700 In: 1543.3 [P.O.:120; I.V.:923.3; IV Piggyback:500] Out: 900 [Urine:500; Emesis/NG output:400] Intake/Output this shift: No intake/output data recorded.  General appearance: alert and cooperative Resp: clear to auscultation bilaterally Cardio: regular rate and rhythm GI: dressings dry, +BS, mild distention Ext: calves soft  Lab Results:  Recent Labs    05/21/17 0636 05/22/17 0502  WBC 15.5* 14.2*  HGB 14.7 12.5*  HCT 44.1 37.5*  PLT 129* 93*   BMET Recent Labs    05/21/17 0636 05/22/17 0502  NA 133* 136  K 5.2* 4.6  CL 103 103  CO2 18* 22  GLUCOSE 178* 118*  BUN 30* 39*  CREATININE 2.91* 1.95*  CALCIUM 8.4* 8.4*   PT/INR No results for input(s): LABPROT, INR in the last 72 hours. ABG No results for input(s): PHART, HCO3 in the last 72 hours.  Invalid input(s): PCO2, PO2  Studies/Results: No results found.  Anti-infectives: Anti-infectives (From admission, onward)   Start     Dose/Rate Route Frequency Ordered Stop   05/20/17 0629  cefoTEtan in Dextrose 5% (CEFOTAN) IVPB 2 g     2 g Intravenous On call to O.R. 05/20/17 0629 05/20/17 1242      Assessment/Plan: S/P LOA and colostomy takedown 4/22 - Entereg, await bowel function HTN/CAD - home meds, holding lisinopril with CRT elevation CRI with worsening renal function - likely was dehydrated from prep, improved with hydration, continue 0.9NS Anxiety - Ativan PRN Thrombocytopenia -  chronic, see below FEN - ice and sips, ERAS, D/C Dilaudid, Fentanyl PRN VTE - D/C Lovenox as PLTs under 100k and some blood PR Dispo - mobilize, await bowel function  LOS: 2 days    Christopher [name] 05/22/2017

## 2017-05-22 NOTE — Progress Notes (Signed)
Pt refusing gabapentin and protonix. Dr Hulen Skains made aware.

## 2017-05-22 NOTE — Evaluation (Signed)
Physical Therapy Evaluation Patient Details Name: Christopher [name] MRN: [id number] DOB: 15-Mar-1960 Today's Date: 05/22/2017   History of Present Illness  57 year old male presenting for a post-operative visit. [name] presents for follow-up status post L colectomy with colostomy for obstructing left colon cancer and April 2018.  He was able to tolerate some chemotherapy and has completed this.  He had difficulties during that treatment. s/p colostomy takedown 05/20/17  Clinical Impression  Patient is s/p above surgery resulting in functional limitations due to the deficits listed below (see PT Problem List). PTA pt independent in all aspects of daily living. Pt currently supervision for transfers and ambulation of 200 feet without AD. Pt too nauseated to perform stair training during evaluation and as pt has 14 steps to enter his apartment would be beneficial to perform stair training prior to d/c. Pending stair training at supervision level of assist PT recommends d/c home without PT follow up.        Follow Up Recommendations No PT follow up    Equipment Recommendations  None recommended by PT    Recommendations for Other Services       Precautions / Restrictions Restrictions Weight Bearing Restrictions: No      Mobility  Bed Mobility               General bed mobility comments: in bathroom on entry   Transfers Overall transfer level: Modified independent Equipment used: (used hand rail beside toilet)             General transfer comment: use of handrail beside toilet to stand  Ambulation/Gait Ambulation/Gait assistance: Supervision Ambulation Distance (Feet): 200 Feet Assistive device: None Gait Pattern/deviations: Step-through pattern;Decreased step length - right;Decreased step length - left;Shuffle Gait velocity: slowed Gait velocity interpretation: 1.31 - 2.62 ft/sec, indicative of limited community ambulator General Gait Details: supervision for safety and  management of IV pole, slow, shuffling but steady gait.   Stairs Stairs: (attempt during next session )          Wheelchair Mobility    Modified Rankin (Stroke Patients Only)       Balance Overall balance assessment: No apparent balance deficits (not formally assessed)                                           Pertinent Vitals/Pain Pain Assessment: No/denies pain(limited by nausea)    Home Living Family/patient expects to be discharged to:: Private residence Living Arrangements: Alone Available Help at Discharge: Family(brother will take him home, other than that no assist) Type of Home: Apartment Home Access: Stairs to enter Entrance Stairs-Rails: Right Entrance Stairs-Number of Steps: 14 Home Layout: One level Home Equipment: None      Prior Function Level of Independence: Independent                  Extremity/Trunk Assessment   Upper Extremity Assessment Upper Extremity Assessment: Generalized weakness    Lower Extremity Assessment Lower Extremity Assessment: Generalized weakness       Communication   Communication: No difficulties  Cognition Arousal/Alertness: Awake/alert Behavior During Therapy: Flat affect Overall Cognitive Status: Within Functional Limits for tasks assessed  Assessment/Plan    PT Assessment Patient needs continued PT services  PT Problem List Decreased activity tolerance;Decreased strength       PT Treatment Interventions Stair training;Gait training;Patient/family education    PT Goals (Current goals can be found in the Care Plan section)  Acute Rehab PT Goals Patient Stated Goal: feel better PT Goal Formulation: With patient Time For Goal Achievement: 06/05/17 Potential to Achieve Goals: Good    Frequency Min 3X/week   Barriers to discharge Decreased caregiver support no friends or family available to assist more than  driving him home       AM-PAC PT "6 Clicks" Daily Activity  Outcome Measure Difficulty turning over in bed (including adjusting bedclothes, sheets and blankets)?: None Difficulty moving from lying on back to sitting on the side of the bed? : None Difficulty sitting down on and standing up from a chair with arms (e.g., wheelchair, bedside commode, etc,.)?: None Help needed moving to and from a bed to chair (including a wheelchair)?: None Help needed walking in hospital room?: None Help needed climbing 3-5 steps with a railing? : A Little 6 Click Score: 23    End of Session   Activity Tolerance: Other (comment)(limited by nausea) Patient left: in chair;with call bell/phone within reach Nurse Communication: Mobility status PT Visit Diagnosis: Muscle weakness (generalized) (M62.81);Other abnormalities of gait and mobility (R26.89)    Time: 1001-1023 PT Time Calculation (min) (ACUTE ONLY): 22 min   Charges:   PT Evaluation $PT Eval Low Complexity: 1 Low     PT G Codes:        Geonna Lockyer B. Migdalia Dk PT, DPT Acute Rehabilitation  [phone] Pager [phone]    [place] 05/22/2017, 10:47 AM

## 2017-05-23 LAB — CBC
HCT: 33.7 % — ABNORMAL LOW (ref 39.0–52.0)
Hemoglobin: 11.1 g/dL — ABNORMAL LOW (ref 13.0–17.0)
MCH: 30.7 pg (ref 26.0–34.0)
MCHC: 32.9 g/dL (ref 30.0–36.0)
MCV: 93.1 fL (ref 78.0–100.0)
PLATELETS: 90 10*3/uL — AB (ref 150–400)
RBC: 3.62 MIL/uL — ABNORMAL LOW (ref 4.22–5.81)
RDW: 14.6 % (ref 11.5–15.5)
WBC: 13.6 10*3/uL — ABNORMAL HIGH (ref 4.0–10.5)

## 2017-05-23 LAB — BASIC METABOLIC PANEL
Anion gap: 8 (ref 5–15)
BUN: 33 mg/dL — AB (ref 6–20)
CALCIUM: 8.3 mg/dL — AB (ref 8.9–10.3)
CO2: 23 mmol/L (ref 22–32)
Chloride: 110 mmol/L (ref 101–111)
Creatinine, Ser: 1.29 mg/dL — ABNORMAL HIGH (ref 0.61–1.24)
GFR calc Af Amer: >60 mL/min (ref >60)
GLUCOSE: 115 mg/dL — AB (ref 65–99)
POTASSIUM: 4.2 mmol/L (ref 3.5–5.1)
Sodium: 141 mmol/L (ref 135–145)

## 2017-05-23 MED ORDER — OXYCODONE HCL 5 MG PO TABS
5.0000 mg | ORAL_TABLET | ORAL | Status: DC | PRN
  Administered 2017-05-23 (×2): 10 mg via ORAL
  Filled 2017-05-23 (×3): qty 2

## 2017-05-23 MED ORDER — DIPHENHYDRAMINE HCL 50 MG/ML IJ SOLN
25.0000 mg | Freq: Every day | INTRAMUSCULAR | Status: DC
  Administered 2017-05-23: 25 mg via INTRAVENOUS
  Filled 2017-05-23 (×3): qty 1

## 2017-05-23 NOTE — Consult Note (Signed)
            Christopher [name]  05/23/2017  Christopher [name] 08-07-1960 [id number]   Went to see patient in the room to identify possible discharge needs. Patient had vented out his disappointments and sentiments regarding healthcare benefit rules being implemented by the government. Patientreportscoming for reversal of colostomy that ledtothis admission/ surgery.  Per MD note, patient presented for follow-up, status post left colectomy with colostomy for obstructing left colon cancer- April 2018. He was able to tolerate some chemotherapy and has completed this. (underwent colostomy takedown)  PatientendorsesDr.Josalyn Funches or whoever took her place (Dr. Karle Plumber- will need to see, per patient) with [facility] and [place] care provider.    Patientverbalized using [facility] [address] toobtain medications without difficulty so far.  Patientreports that he manages hisownmedications at homeusing "pill box" system filled every week.  Patient statesthatbrother Dominica Severin- lives closeby) has been providingtransportationhisdoctors'appointmentswhen needed.  Patientreportslivingat home alone and independent will all aspects of daily living prior to this admission/ surgery. His brother comes to visit and assists with his needs if needed.  Anticipated plan for discharge isstill unsure per patient but he states "hoping to go home".  Patientvoiced understandingto callprimarycareprovider'soffice whenhereturns home,for a post discharge follow-upvisitwithin1- 2 weeksor sooner if needs arise.Patient letter (with PCP's contact number) was provided asareminder.   Discussed with patientregarding [facility] CM services available for healthmanagement/ resources at home. He voiced preference for phone calls over home visits (does not want people coming to his house, if possible). Patient  planstodiscuss withhisprimary care provider onnext visit, for further health managementservicesthathewillbe needing at home. Patientverbalizedunderstandingof needto seekreferral from primary care provider to [facility] care management ifdeemed necessary and appropriatefor further services in the nearfuture.   [facility] care management information was provided for futureneedsthathe may have.  Patienthadonly opted and verbally agreed forEMMI callstofollow-up withhisrecovery at home.  Referral made for [facility] General calls after discharge.   For additional questions please contact:  Edwena Felty A. Pratik Dalziel, BSN, RN-BC [facility] PRIMARY CARE [name] Cell: [phone]

## 2017-05-23 NOTE — Progress Notes (Signed)
3 Days Post-Op   Subjective/Chief Complaint: Had BM this AM, brown liquid. Not sleeping well.   Objective: Vital signs in last 24 hours: Temp:  [98 F (36.7 C)-98.8 F (37.1 C)] 98 F (36.7 C) (04/24 2305) Pulse Rate:  [106-113] 108 (04/24 2305) Resp:  [18] 18 (04/24 2305) BP: (113-128)/(82-88) 120/82 (04/24 2305) SpO2:  [91 %-92 %] 92 % (04/24 2305) Last BM Date: (pt doesn't remember)  Intake/Output from previous day: 04/24 0701 - 04/25 0700 In: 1240 [P.O.:240; I.V.:1000] Out: 450 [Urine:450] Intake/Output this shift: No intake/output data recorded.  General appearance: alert and cooperative Resp: clear to auscultation bilaterally Cardio: regular rate and rhythm GI: soft, +BS, dry stains on dressings  Lab Results:  Recent Labs    05/21/17 0636 05/22/17 0502  WBC 15.5* 14.2*  HGB 14.7 12.5*  HCT 44.1 37.5*  PLT 129* 93*   BMET Recent Labs    05/21/17 0636 05/22/17 0502  NA 133* 136  K 5.2* 4.6  CL 103 103  CO2 18* 22  GLUCOSE 178* 118*  BUN 30* 39*  CREATININE 2.91* 1.95*  CALCIUM 8.4* 8.4*   PT/INR No results for input(s): LABPROT, INR in the last 72 hours. ABG No results for input(s): PHART, HCO3 in the last 72 hours.  Invalid input(s): PCO2, PO2  Studies/Results: No results found.  Anti-infectives: Anti-infectives (From admission, onward)   Start     Dose/Rate Route Frequency Ordered Stop   05/20/17 0629  cefoTEtan in Dextrose 5% (CEFOTAN) IVPB 2 g     2 g Intravenous On call to O.R. 05/20/17 0629 05/20/17 1242      Assessment/Plan: S/P LOA and colostomy takedown 4/22 - Entereg, start clears HTN/CAD - home meds, holding lisinopril with CRT elevation CRI with worsening renal function - likely was dehydrated from prep, improved with hydration, BMET P now Anxiety - Ativan PRN Thrombocytopenia - CBC P now FEN - clears, ERAS, oxy VTE - no Lovenox as PLTs under 100k but today's CBC P Dispo - mobilize  LOS: 3 days    Zenovia Jarred 05/23/2017

## 2017-05-23 NOTE — Progress Notes (Signed)
Physical Therapy Treatment Patient Details Name: Christopher [name] MRN: [id number] DOB: April 04, 1960 Today's Date: 05/23/2017    History of Present Illness 57 year old male presenting for a post-operative visit. Christopher [name] presents for follow-up status post L colectomy with colostomy for obstructing left colon cancer and April 2018.  He was able to tolerate some chemotherapy and has completed this.  He had difficulties during that treatment. s/p colostomy takedown 05/20/17    PT Comments    Patient is progressing with mobility and overall mod I/supervision for safety. Current plan remains appropriate.    Follow Up Recommendations  No PT follow up     Equipment Recommendations  None recommended by PT    Recommendations for Other Services       Precautions / Restrictions Precautions Precautions: None    Mobility  Bed Mobility Overal bed mobility: Independent                Transfers Overall transfer level: Independent                  Ambulation/Gait Ambulation/Gait assistance: Modified independent (Device/Increase time);Supervision   Assistive device: None Gait Pattern/deviations: Step-through pattern;Decreased stride length;Trunk flexed     General Gait Details: cues for posture and supervision for managing IV pole   Stairs Stairs: Yes Stairs assistance: Modified independent (Device/Increase time) Stair Management: One rail Right;Forwards;Alternating pattern Number of Stairs: 4 General stair comments: pt mod I; supervision for safety with line management   Wheelchair Mobility    Modified Rankin (Stroke Patients Only)       Balance Overall balance assessment: No apparent balance deficits (not formally assessed)                                          Cognition Arousal/Alertness: Awake/alert Behavior During Therapy: WFL for tasks assessed/performed;Restless Overall Cognitive Status: Within Functional Limits for tasks assessed                                         Exercises      General Comments        Pertinent Vitals/Pain Pain Assessment: No/denies pain    Home Living                      Prior Function            PT Goals (current goals can now be found in the care plan section) Acute Rehab PT Goals Patient Stated Goal: feel better PT Goal Formulation: With patient Time For Goal Achievement: 06/05/17 Potential to Achieve Goals: Good Progress towards PT goals: Progressing toward goals    Frequency    Min 3X/week      PT Plan Current plan remains appropriate    Co-evaluation              AM-PAC PT "6 Clicks" Daily Activity  Outcome Measure  Difficulty turning over in bed (including adjusting bedclothes, sheets and blankets)?: None Difficulty moving from lying on back to sitting on the side of the bed? : None Difficulty sitting down on and standing up from a chair with arms (e.g., wheelchair, bedside commode, etc,.)?: None Help needed moving to and from a bed to chair (including a wheelchair)?: None Help needed walking in hospital room?: None  Help needed climbing 3-5 steps with a railing? : A Little 6 Click Score: 23    End of Session   Activity Tolerance: Patient tolerated treatment well Patient left: with call bell/phone within reach;Other (comment)(pt sitting EOB) Nurse Communication: Mobility status PT Visit Diagnosis: Muscle weakness (generalized) (M62.81);Other abnormalities of gait and mobility (R26.89)     Time: 1115-5208 PT Time Calculation (min) (ACUTE ONLY): 27 min  Charges:  $Gait Training: 23-37 mins                    G Codes:       Earney Navy, PTA Pager: [phone]     Darliss Cheney 05/23/2017, 4:24 PM

## 2017-05-23 NOTE — Plan of Care (Signed)
  Problem: Activity: Goal: Risk for activity intolerance will decrease Outcome: Progressing   Problem: Coping: Goal: Level of anxiety will decrease Outcome: Progressing   Problem: Skin Integrity: Goal: Risk for impaired skin integrity will decrease Outcome: Progressing   Problem: Clinical Measurements: Goal: Postoperative complications will be avoided or minimized Outcome: Progressing   

## 2017-05-24 LAB — BASIC METABOLIC PANEL
Anion gap: 7 (ref 5–15)
BUN: 23 mg/dL — AB (ref 6–20)
CHLORIDE: 113 mmol/L — AB (ref 101–111)
CO2: 23 mmol/L (ref 22–32)
CREATININE: 1.06 mg/dL (ref 0.61–1.24)
Calcium: 8 mg/dL — ABNORMAL LOW (ref 8.9–10.3)
GFR calc Af Amer: >60 mL/min (ref >60)
GFR calc non Af Amer: >60 mL/min (ref >60)
GLUCOSE: 90 mg/dL (ref 65–99)
POTASSIUM: 4.3 mmol/L (ref 3.5–5.1)
Sodium: 143 mmol/L (ref 135–145)

## 2017-05-24 MED ORDER — ZOLPIDEM TARTRATE 5 MG PO TABS
5.0000 mg | ORAL_TABLET | Freq: Every evening | ORAL | Status: DC | PRN
  Administered 2017-05-24: 5 mg via ORAL
  Filled 2017-05-24: qty 1

## 2017-05-24 MED ORDER — HYDROCODONE-ACETAMINOPHEN 7.5-325 MG PO TABS
1.0000 | ORAL_TABLET | ORAL | Status: DC | PRN
Start: 2017-05-24 — End: 2017-05-25
  Administered 2017-05-24: 1 via ORAL
  Filled 2017-05-24 (×2): qty 1

## 2017-05-24 NOTE — Plan of Care (Signed)
  Problem: Skin Integrity: Goal: Demonstration of wound healing without infection will improve Outcome: Progressing   

## 2017-05-24 NOTE — Care Management Note (Signed)
Case Management Note  Patient Details  Name: Christopher [name] MRN: [id number] Date of Birth: 09/24/1960  Subjective/Objective:     colostomy takedown 4/22   Action/Plan: CM following for progression of care; CRI, advancing diet as tolerated.  Expected Discharge Date:    possibly 05/01/2017              Expected Discharge Plan:  Home/Self Care  Discharge planning Services  CM Consult  Status of Service:  In process, will continue to follow  Sherrilyn Rist [phone] 05/24/2017, 1:54 PM

## 2017-05-24 NOTE — Care Management Important Message (Signed)
Important Message  Patient Details  Name: Christopher [name] MRN: [id number] Date of Birth: 05-Nov-1960   Medicare Important Message Given:  Yes    Berenice Oehlert Montine Circle 05/24/2017, 1:51 PM

## 2017-05-24 NOTE — Progress Notes (Signed)
Physical Therapy Treatment Patient Details Name: Christopher [name] MRN: [id number] DOB: 1960/02/21 Today's Date: 05/24/2017    History of Present Illness 57 year old male presenting for a post-operative visit. Christopher [name] presents for follow-up status post L colectomy with colostomy for obstructing left colon cancer and April 2018.  He was able to tolerate some chemotherapy and has completed this.  He had difficulties during that treatment. s/p colostomy takedown 05/20/17    PT Comments    Pt agreeable to work with therapy today, ambulating unit without AD with mild unsteadiness, stairs with supervision and line mgt. Encouraged patient to ambulate with supervision over the weekend and maintain strength. No follow up recs still appropriate at this time.    Follow Up Recommendations  No PT follow up     Equipment Recommendations  None recommended by PT    Recommendations for Other Services       Precautions / Restrictions Precautions Precautions: None Restrictions Weight Bearing Restrictions: No    Mobility  Bed Mobility Overal bed mobility: Independent             General bed mobility comments: in bathroom on entry   Transfers Overall transfer level: Independent Equipment used: None             General transfer comment: use of handrail beside toilet to stand  Ambulation/Gait Ambulation/Gait assistance: Modified independent (Device/Increase time);Supervision Ambulation Distance (Feet): 300 Feet Assistive device: None Gait Pattern/deviations: WFL(Within Functional Limits) Gait velocity: decreased   General Gait Details: pt holding to IV pole, but when taken away can ambulate without LOB. mild unsteadiness obserseved   Stairs Stairs: Yes Stairs assistance: Modified independent (Device/Increase time) Stair Management: One rail Right;Forwards;Alternating pattern Number of Stairs: 8 General stair comments: pt mod I; supervision for safety with line  management   Wheelchair Mobility    Modified Rankin (Stroke Patients Only)       Balance Overall balance assessment: No apparent balance deficits (not formally assessed)                                          Cognition Arousal/Alertness: Awake/alert Behavior During Therapy: WFL for tasks assessed/performed;Restless Overall Cognitive Status: Within Functional Limits for tasks assessed                                        Exercises      General Comments        Pertinent Vitals/Pain Pain Assessment: No/denies pain    Home Living                      Prior Function            PT Goals (current goals can now be found in the care plan section) Acute Rehab PT Goals Patient Stated Goal: feel better PT Goal Formulation: With patient Time For Goal Achievement: 06/05/17 Potential to Achieve Goals: Good Progress towards PT goals: Progressing toward goals    Frequency    Min 3X/week      PT Plan Current plan remains appropriate    Co-evaluation              AM-PAC PT "6 Clicks" Daily Activity  Outcome Measure  Difficulty turning over in bed (including adjusting bedclothes, sheets and blankets)?:  None Difficulty moving from lying on back to sitting on the side of the bed? : None Difficulty sitting down on and standing up from a chair with arms (e.g., wheelchair, bedside commode, etc,.)?: None Help needed moving to and from a bed to chair (including a wheelchair)?: None Help needed walking in hospital room?: None Help needed climbing 3-5 steps with a railing? : A Little 6 Click Score: 23    End of Session Equipment Utilized During Treatment: Gait belt Activity Tolerance: Patient tolerated treatment well Patient left: with call bell/phone within reach;Other (comment) Nurse Communication: Mobility status PT Visit Diagnosis: Muscle weakness (generalized) (M62.81);Other abnormalities of gait and mobility  (R26.89)     Time: 6147-0929 PT Time Calculation (min) (ACUTE ONLY): 12 min  Charges:  $Gait Training: 8-22 mins                    G Codes:       Reinaldo Berber, PT, DPT Acute Rehab Services Pager: [phone]     Reinaldo Berber 05/24/2017, 2:19 PM

## 2017-05-24 NOTE — Progress Notes (Signed)
4 Days Post-Op   Subjective/Chief Complaint: Did not take much clears, had 6 BM, oxycodone not helping pain   Objective: Vital signs in last 24 hours: Temp:  [98 F (36.7 C)-98.2 F (36.8 C)] 98 F (36.7 C) (04/26 0446) Pulse Rate:  [93-109] 96 (04/26 0446) Resp:  [15-17] 17 (04/26 0446) BP: (130-142)/(83-94) 138/83 (04/26 0446) SpO2:  [94 %-95 %] 95 % (04/26 0446) Last BM Date: 05/23/17  Intake/Output from previous day: 04/25 0701 - 04/26 0700 In: 599 [I.V.:599] Out: 200 [Urine:200] Intake/Output this shift: No intake/output data recorded.  General appearance: cooperative Resp: clear to auscultation bilaterally Cardio: regular rate and rhythm GI: soft, +BS, incisions CDI  Lab Results:  Recent Labs    05/22/17 0502 05/23/17 0741  WBC 14.2* 13.6*  HGB 12.5* 11.1*  HCT 37.5* 33.7*  PLT 93* 90*   BMET Recent Labs    05/23/17 0741 05/24/17 0438  NA 141 143  K 4.2 4.3  CL 110 113*  CO2 23 23  GLUCOSE 115* 90  BUN 33* 23*  CREATININE 1.29* 1.06  CALCIUM 8.3* 8.0*   PT/INR No results for input(s): LABPROT, INR in the last 72 hours. ABG No results for input(s): PHART, HCO3 in the last 72 hours.  Invalid input(s): PCO2, PO2  Studies/Results: No results found.  Anti-infectives: Anti-infectives (From admission, onward)   Start     Dose/Rate Route Frequency Ordered Stop   05/20/17 0629  cefoTEtan in Dextrose 5% (CEFOTAN) IVPB 2 g     2 g Intravenous On call to O.R. 05/20/17 0629 05/20/17 1242      Assessment/Plan: S/P LOA and colostomy takedown 4/22 - continue clears until taking more in HTN/CAD - home meds, holding lisinopril with CRT elevation, plan resume at D/C CRI with worsening renal function - likely was dehydrated from prep, much better Anxiety - Ativan PRN Thrombocytopenia - chronic FEN - clears, ERAS, change to Norco VTE - no Lovenox as PLTs under 100k Dispo - mobilize, await diet advancement  LOS: 4 days    Zenovia Jarred 05/24/2017

## 2017-05-25 MED ORDER — KETOROLAC TROMETHAMINE 15 MG/ML IJ SOLN: 15.0000 mg | Freq: Four times a day (QID) | INTRAMUSCULAR | Status: DC | PRN

## 2017-05-25 MED ORDER — TRAMADOL HCL 50 MG PO TABS
50.0000 mg | ORAL_TABLET | Freq: Four times a day (QID) | ORAL | Status: DC | PRN
  Administered 2017-05-27: 50 mg via ORAL
  Filled 2017-05-25: qty 1

## 2017-05-25 NOTE — Progress Notes (Addendum)
5 Days Post-Op  Subjective: Stable.  Afebrile.  Alert.  SPO2 95% room air. Lots of anxiety.  Says he is bloated.  Says he vomited but does not remember if it was yesterday or the day before.  Having loose stools.  Voiding without difficulty.  Asking for chocolate milk. Lab work yesterday showed potassium 4.3.  Creatinine 1.96.  BUN 23.  Glucose 90.  Objective: Vital signs in last 24 hours: Temp:  [98.1 F (36.7 C)-98.7 F (37.1 C)] 98.1 F (36.7 C) (04/27 0517) Pulse Rate:  [86-97] 94 (04/27 0517) Resp:  [17-19] 17 (04/27 0517) BP: (127-151)/(83-94) 130/90 (04/27 0517) SpO2:  [95 %-97 %] 95 % (04/27 0517) Last BM Date: 05/24/17  Intake/Output from previous day: 04/26 0701 - 04/27 0700 In: 417.9 [I.V.:417.9] Out: 175 [Urine:175] Intake/Output this shift: No intake/output data recorded.  General appearance: Alert.  No acute distress.  Skin warm and dry.  Anxious.  Somewhat negative about his progress. Resp: clear to auscultation bilaterally GI: Soft.  Wounds look good.  Borderline distended.  Not tympanitic.  Hypoactive bowel sounds.  Appropriate exam for postop Extremities: extremities normal, atraumatic, no cyanosis or edema  Lab Results:  No results found for this or any previous visit (from the past 24 hour(s)).   Studies/Results: No results found.  Marland Kitchen aspirin  81 mg Oral Daily  . carvedilol  3.125 mg Oral BID WC  . digoxin  0.125 mg Oral Daily  . diphenhydrAMINE  25 mg Intravenous QHS  . pantoprazole  40 mg Oral Daily     Assessment/Plan: s/p Procedure(s): COLOSTOMY TAKEDOWN  S/P LOA and colostomy takedown 4/22 - POD#5 - continue clears plus chocolate milk until ileus resolves.  Increase ambulation.  Decrease narcotic. HTN/CAD - home meds, holding lisinopril with CRT elevation, plan resume at D/C CRI with worsening renal function - likely was dehydrated from prep, much better.  Seems euvolemic.  I have requested strict intake and output record. Anxiety -this is a  prominent aspect of his mental status but otherwise mental status okay.  Ativan PRN Thrombocytopenia - chronic FEN - clears, ERAS, change to Toradol IV and tramadol p.o. VTE - no Lovenox as PLTs under 100k Dispo - mobilize, await diet advancement    @PROBHOSP @  LOS: 5 days    Adin Hector 05/25/2017  . .prob

## 2017-05-26 NOTE — Progress Notes (Signed)
6 Days Post-Op  Subjective: Stable and alert. Ambulating in halls.  Voiding without difficulty.  Tolerating clear liquids plus chocolate milk Having bowel movements which are loose Seems to be doing well but he remains quite anxious and says he is bloated and a little bit crampy.  Objective: Vital signs in last 24 hours: Temp:  [97.7 F (36.5 C)-98.1 F (36.7 C)] 97.7 F (36.5 C) (04/28 0515) Pulse Rate:  [90-92] 92 (04/28 0515) Resp:  [16-17] 17 (04/28 0515) BP: (140-144)/(90-99) 140/99 (04/28 0515) SpO2:  [97 %-99 %] 99 % (04/28 0515) Last BM Date: 05/25/17  Intake/Output from previous day: 04/27 0701 - 04/28 0700 In: 3330.8 [P.O.:1560; I.V.:1770.8] Out: -  Intake/Output this shift: Total I/O In: 2970.8 [P.O.:1200; I.V.:1770.8] Out: -   PE: General appearance: Alert.  No acute distress.  Skin warm and dry.  Anxious.  Somewhat negative about his progress. Resp: clear to auscultation bilaterally GI: Soft.  Wounds look good.  Borderline distended.  Not tympanitic.    Normal bowel sounds.  Appropriate exam for postop Extremities: extremities normal, atraumatic, no cyanosis or edema     Lab Results:  No results found for this or any previous visit (from the past 24 hour(s)).   Studies/Results: No results found.  Marland Kitchen aspirin  81 mg Oral Daily  . carvedilol  3.125 mg Oral BID WC  . digoxin  0.125 mg Oral Daily  . diphenhydrAMINE  25 mg Intravenous QHS  . pantoprazole  40 mg Oral Daily     Assessment/Plan: s/p Procedure(s): COLOSTOMY TAKEDOWN  S/P LOA and colostomy takedown 4/22- POD#6 -making satisfactory progress.  Start full liquid diet today and advance to soft diet as tolerated HTN/CAD- home meds, holding lisinopril with CRT elevation, plan resume at D/C.Marland Kitchen Last creatinine down to 1.06 on 4/26.  Will check b-met tomorrow morning to decide about lisinopril at discharge CRI with worsening renal function- likely was dehydrated from prep, much better.  Seems  euvolemic.  I have requested strict intake and output record. Anxiety-this is a prominent aspect of his mental status but otherwise mental status okay.  Ativan PRN Thrombocytopenia- chronic FEN- clears, ERAS, change to Toradol IV and tramadol p.o. VTE- no Lovenox as PLTs under 100k Dispo- mobilize, await diet advancement    @PROBHOSP @  LOS: 6 days    Christopher [name] 05/26/2017  . .prob

## 2017-05-27 LAB — CBC
HEMATOCRIT: 34.4 % — AB (ref 39.0–52.0)
Hemoglobin: 11.9 g/dL — ABNORMAL LOW (ref 13.0–17.0)
MCH: 32.3 pg (ref 26.0–34.0)
MCHC: 34.6 g/dL (ref 30.0–36.0)
MCV: 93.5 fL (ref 78.0–100.0)
PLATELETS: 135 10*3/uL — AB (ref 150–400)
RBC: 3.68 MIL/uL — AB (ref 4.22–5.81)
RDW: 14.9 % (ref 11.5–15.5)
WBC: 12.8 10*3/uL — ABNORMAL HIGH (ref 4.0–10.5)

## 2017-05-27 LAB — BASIC METABOLIC PANEL
Anion gap: 8 (ref 5–15)
BUN: 15 mg/dL (ref 6–20)
CHLORIDE: 109 mmol/L (ref 101–111)
CO2: 26 mmol/L (ref 22–32)
Calcium: 8.6 mg/dL — ABNORMAL LOW (ref 8.9–10.3)
Creatinine, Ser: 1.06 mg/dL (ref 0.61–1.24)
GFR calc Af Amer: >60 mL/min (ref >60)
GFR calc non Af Amer: >60 mL/min (ref >60)
GLUCOSE: 111 mg/dL — AB (ref 65–99)
POTASSIUM: 3.7 mmol/L (ref 3.5–5.1)
Sodium: 143 mmol/L (ref 135–145)

## 2017-05-27 MED ORDER — ACETAMINOPHEN 325 MG PO TABS: 650.0000 mg | ORAL_TABLET | ORAL | Status: DC | PRN

## 2017-05-27 NOTE — Discharge Summary (Signed)
Physician Discharge Summary  Patient ID: Christopher [name] MRN: [id number] DOB/AGE: Jun 04, 1960 57 y.o.  Admit date: 05/20/2017 Discharge date: 05/27/2017  Admission Diagnoses:colostomy in place  Discharge Diagnoses: S/P colostomy takedown Active Problems:   S/P colostomy takedown   Discharged Condition: good  Hospital Course: He did well post-op and tolerated gradual advancement of his diet. Good bowel function. Ready for D/C. He only wants to take Tylenol for pain.  Discharge Exam: Blood pressure 122/70, pulse 90, temperature 98.6 F (37 C), temperature source Oral, resp. rate 17, height 5' 7.5" (1.715 m), weight 84.5 kg (186 lb 4.6 oz), SpO2 96 %. General appearance: cooperative Resp: clear to auscultation bilaterally Cardio: regular rate and rhythm GI: soft, incisions CDI  Disposition: Discharge disposition: 01-Home or Self Care       Discharge Instructions    Call MD for:  redness, tenderness, or signs of infection (pain, swelling, redness, odor or green/yellow discharge around incision site)   Complete by:  As directed    Diet - low sodium heart healthy   Complete by:  As directed    Discharge instructions   Complete by:  As directed    [facility], [place] [phone]  OPEN ABDOMINAL SURGERY: POST OP INSTRUCTIONS  Always review your discharge instruction sheet given to you by the facility where your surgery was performed.  IF YOU HAVE DISABILITY OR FAMILY LEAVE FORMS, YOU MUST BRING THEM TO THE OFFICE FOR PROCESSING.  PLEASE DO NOT GIVE THEM TO YOUR DOCTOR.  A prescription for pain medication may be given to you upon discharge.  Take your pain medication as prescribed, if needed.  If narcotic pain medicine is not needed, then you may take acetaminophen (Tylenol) or ibuprofen (Advil) as needed. Take your usually prescribed medications unless otherwise directed. If you need a refill on your pain medication, please contact your pharmacy. They will  contact our office to request authorization.  Prescriptions will not be filled after 5pm or on week-ends. You should follow a light diet the first few days after arrival home, such as soup and crackers, pudding, etc.unless your doctor has advised otherwise. A high-fiber, low fat diet can be resumed as tolerated.   Be sure to include lots of fluids daily. Most patients will experience some swelling and bruising on the chest and neck area.  Ice packs will help.  Swelling and bruising can take several days to resolve Most patients will experience some swelling and bruising in the area of the incision. Ice pack will help. Swelling and bruising can take several days to resolve..  It is common to experience some constipation if taking pain medication after surgery.  Increasing fluid intake and taking a stool softener will usually help or prevent this problem from occurring.  A mild laxative (Milk of Magnesia or Miralax) should be taken according to package directions if there are no bowel movements after 48 hours.  You may have steri-strips (small skin tapes) in place directly over the incision.  These strips should be left on the skin for 7-10 days.  If your surgeon used skin glue on the incision, you may shower in 24 hours.  The glue will flake off over the next 2-3 weeks.  Any sutures or staples will be removed at the office during your follow-up visit. You may find that a light gauze bandage over your incision may keep your staples from being rubbed or pulled. You may shower and replace the bandage daily.  ACTIVITIES:  You may resume regular (light) daily activities beginning the next day-such as daily self-care, walking, climbing stairs-gradually increasing activities as tolerated.  You may have sexual intercourse when it is comfortable.  Refrain from any heavy lifting or straining until approved by your doctor. You may drive when you no longer are taking prescription pain medication, you can comfortably wear a  seatbelt, and you can safely maneuver your car and apply brakes Return to Work: ___________________________________ Christopher [name] should see your doctor in the office for a follow-up appointment approximately two weeks after your surgery.  Make sure that you call for this appointment within a day or two after you arrive home to insure a convenient appointment time. OTHER INSTRUCTIONS:  _____________________________________________________________ _____________________________________________________________  WHEN TO CALL YOUR DOCTOR: Fever over 101.0 Inability to urinate Nausea and/or vomiting Extreme swelling or bruising Continued bleeding from incision. Increased pain, redness, or drainage from the incision. Difficulty swallowing or breathing Muscle cramping or spasms. Numbness or tingling in hands or feet or around lips.  The clinic staff is available to answer your questions during regular business hours.  Please don't hesitate to call and ask to speak to one of the nurses if you have concerns.  For further questions, please visit [email or web address]   Increase activity slowly   Complete by:  As directed    Lifting restrictions   Complete by:  As directed    No lifting over 10lbs     Allergies as of 05/27/2017      Reactions   Capecitabine Other (See Comments)   Server dehydration and diarrhea   Celebrex [celecoxib] Other (See Comments)   Caused his back to burn   Contrast Media [iodinated Diagnostic Agents] Other (See Comments)   Patient feels weakness      Medication List    STOP taking these medications   ondansetron 4 MG tablet Commonly known as:  ZOFRAN     TAKE these medications   acetaminophen 325 MG tablet Commonly known as:  TYLENOL Take 2 tablets (650 mg total) by mouth every 4 (four) hours as needed.   aspirin 81 MG chewable tablet Chew 1 tablet (81 mg total) by mouth daily.   atorvastatin 80 MG tablet Commonly known as:  LIPITOR Take 1 tablet (80  mg total) by mouth daily at 6 PM.   carvedilol 3.125 MG tablet Commonly known as:  COREG Take 1 tablet (3.125 mg total) by mouth 2 (two) times daily with a meal.   clopidogrel 75 MG tablet Commonly known as:  PLAVIX Take 75 mg by mouth daily.   digoxin 0.125 MG tablet Commonly known as:  LANOXIN Take 1 tablet (0.125 mg total) by mouth daily.   ferrous sulfate 325 (65 FE) MG tablet Take 1 tablet (325 mg total) by mouth 2 (two) times daily with a meal.   lisinopril 2.5 MG tablet Commonly known as:  PRINIVIL,ZESTRIL Take 1 tablet (2.5 mg total) by mouth daily.   multivitamin with minerals Tabs tablet Take 1 tablet by mouth daily.   nitroGLYCERIN 0.4 MG SL tablet Commonly known as:  NITROSTAT Place 1 tablet (0.4 mg total) under the tongue every 5 (five) minutes as needed for chest pain.   saccharomyces boulardii 250 MG capsule Commonly known as:  FLORASTOR Take 1 capsule (250 mg total) by mouth 2 (two) times daily. What changed:    how much to take  when to take this      Follow-up Information    [name], MD Follow  up.   Specialty:  General Surgery Why:  My office will call you tomorrow to give you an appointment Contact information: [address] [place] [address] [phone]           Signed: Zenovia Jarred 05/27/2017, 7:29 AM

## 2017-05-27 NOTE — Progress Notes (Signed)
Patient discharged to home with instructions. 

## 2017-06-01 DIAGNOSIS — K9409 Other complications of colostomy: Secondary | ICD-10-CM | POA: Diagnosis not present

## 2017-06-01 DIAGNOSIS — T83498A Other mechanical complication of other prosthetic devices, implants and grafts of genital tract, initial encounter: Secondary | ICD-10-CM | POA: Diagnosis not present

## 2017-06-02 ENCOUNTER — Emergency Department (HOSPITAL_COMMUNITY): Payer: Medicare Other

## 2017-06-02 ENCOUNTER — Encounter (HOSPITAL_COMMUNITY): Payer: Self-pay

## 2017-06-02 ENCOUNTER — Emergency Department (HOSPITAL_COMMUNITY)
Admission: EM | Admit: 2017-06-02 | Discharge: 2017-06-02 | Disposition: A | Discharge status: Home / Self Care | Payer: Medicare Other | Attending: Emergency Medicine | Admitting: Emergency Medicine

## 2017-06-02 ENCOUNTER — Other Ambulatory Visit: Payer: Self-pay

## 2017-06-02 DIAGNOSIS — I10 Essential (primary) hypertension: Secondary | ICD-10-CM | POA: Insufficient documentation

## 2017-06-02 DIAGNOSIS — Z7982 Long term (current) use of aspirin: Secondary | ICD-10-CM | POA: Insufficient documentation

## 2017-06-02 DIAGNOSIS — Z85038 Personal history of other malignant neoplasm of large intestine: Secondary | ICD-10-CM | POA: Diagnosis not present

## 2017-06-02 DIAGNOSIS — R109 Unspecified abdominal pain: Secondary | ICD-10-CM | POA: Diagnosis not present

## 2017-06-02 DIAGNOSIS — I5022 Chronic systolic (congestive) heart failure: Secondary | ICD-10-CM | POA: Insufficient documentation

## 2017-06-02 DIAGNOSIS — Z79899 Other long term (current) drug therapy: Secondary | ICD-10-CM | POA: Insufficient documentation

## 2017-06-02 DIAGNOSIS — Z87891 Personal history of nicotine dependence: Secondary | ICD-10-CM | POA: Insufficient documentation

## 2017-06-02 DIAGNOSIS — Z95810 Presence of automatic (implantable) cardiac defibrillator: Secondary | ICD-10-CM | POA: Insufficient documentation

## 2017-06-02 DIAGNOSIS — R197 Diarrhea, unspecified: Secondary | ICD-10-CM | POA: Insufficient documentation

## 2017-06-02 DIAGNOSIS — N281 Cyst of kidney, acquired: Secondary | ICD-10-CM | POA: Diagnosis not present

## 2017-06-02 DIAGNOSIS — I252 Old myocardial infarction: Secondary | ICD-10-CM | POA: Insufficient documentation

## 2017-06-02 DIAGNOSIS — G8918 Other acute postprocedural pain: Secondary | ICD-10-CM | POA: Insufficient documentation

## 2017-06-02 DIAGNOSIS — Z7901 Long term (current) use of anticoagulants: Secondary | ICD-10-CM | POA: Diagnosis not present

## 2017-06-02 DIAGNOSIS — J449 Chronic obstructive pulmonary disease, unspecified: Secondary | ICD-10-CM | POA: Diagnosis not present

## 2017-06-02 LAB — CBC
HCT: 41.5 % (ref 39.0–52.0)
Hemoglobin: 13.9 g/dL (ref 13.0–17.0)
MCH: 30.9 pg (ref 26.0–34.0)
MCHC: 33.5 g/dL (ref 30.0–36.0)
MCV: 92.2 fL (ref 78.0–100.0)
Platelets: 276 10*3/uL (ref 150–400)
RBC: 4.5 MIL/uL (ref 4.22–5.81)
RDW: 14.2 % (ref 11.5–15.5)
WBC: 14.1 10*3/uL — AB (ref 4.0–10.5)

## 2017-06-02 LAB — URINALYSIS, ROUTINE W REFLEX MICROSCOPIC
BILIRUBIN URINE: NEGATIVE
GLUCOSE, UA: NEGATIVE mg/dL
KETONES UR: 20 mg/dL — AB
LEUKOCYTES UA: NEGATIVE
NITRITE: NEGATIVE
PH: 5 (ref 5.0–8.0)
Protein, ur: NEGATIVE mg/dL
Specific Gravity, Urine: 1.015 (ref 1.005–1.030)

## 2017-06-02 LAB — COMPREHENSIVE METABOLIC PANEL
ALT: 35 U/L (ref 17–63)
AST: 24 U/L (ref 15–41)
Albumin: 3.4 g/dL — ABNORMAL LOW (ref 3.5–5.0)
Alkaline Phosphatase: 55 U/L (ref 38–126)
Anion gap: 15 (ref 5–15)
BILIRUBIN TOTAL: 0.8 mg/dL (ref 0.3–1.2)
BUN: 30 mg/dL — AB (ref 6–20)
CO2: 21 mmol/L — ABNORMAL LOW (ref 22–32)
CREATININE: 1.5 mg/dL — AB (ref 0.61–1.24)
Calcium: 9.3 mg/dL (ref 8.9–10.3)
Chloride: 100 mmol/L — ABNORMAL LOW (ref 101–111)
GFR calc Af Amer: 59 mL/min — ABNORMAL LOW (ref >60)
GFR, EST NON AFRICAN AMERICAN: 51 mL/min — AB (ref >60)
Glucose, Bld: 97 mg/dL (ref 65–99)
POTASSIUM: 4.5 mmol/L (ref 3.5–5.1)
Sodium: 136 mmol/L (ref 135–145)
TOTAL PROTEIN: 7.5 g/dL (ref 6.5–8.1)

## 2017-06-02 LAB — C DIFFICILE QUICK SCREEN W PCR REFLEX
C DIFFICILE (CDIFF) INTERP: NOT DETECTED
C DIFFICILE (CDIFF) TOXIN: NEGATIVE
C DIFFICLE (CDIFF) ANTIGEN: NEGATIVE

## 2017-06-02 LAB — LIPASE, BLOOD: LIPASE: 25 U/L (ref 11–51)

## 2017-06-02 MED ORDER — ONDANSETRON 4 MG PO TBDP: 4.0000 mg | ORAL_TABLET | Freq: Three times a day (TID) | ORAL | 0 refills | Status: DC | PRN

## 2017-06-02 MED ORDER — MORPHINE SULFATE (PF) 4 MG/ML IV SOLN
4.0000 mg | Freq: Once | INTRAVENOUS | Status: AC
  Administered 2017-06-02: 4 mg via INTRAVENOUS
  Filled 2017-06-02: qty 1

## 2017-06-02 MED ORDER — SODIUM CHLORIDE 0.9 % IV BOLUS
1000.0000 mL | Freq: Once | INTRAVENOUS | Status: AC
  Administered 2017-06-02: 1000 mL via INTRAVENOUS

## 2017-06-02 MED ORDER — DICYCLOMINE HCL 20 MG PO TABS: 20.0000 mg | ORAL_TABLET | Freq: Two times a day (BID) | ORAL | 0 refills | Status: DC

## 2017-06-02 MED ORDER — ONDANSETRON HCL 4 MG/2ML IJ SOLN
4.0000 mg | Freq: Once | INTRAMUSCULAR | Status: AC
  Administered 2017-06-02: 4 mg via INTRAVENOUS
  Filled 2017-06-02: qty 2

## 2017-06-02 NOTE — Discharge Instructions (Signed)
Your work-up in the emergency department was reassuring.  We recommend Bentyl for management of abdominal pain and spasm.  Take Zofran as needed for nausea.  Drink plenty of clear liquids to prevent dehydration.  Follow-up with your surgeon tomorrow as scheduled.

## 2017-06-02 NOTE — ED Provider Notes (Signed)
[facility] DEPT Provider Note   CSN: [id number] Arrival date & time: 06/02/17  0027    History   Chief Complaint Chief Complaint  Patient presents with  . Diarrhea  . Post-op Problem    HPI Christopher [name] is a 57 y.o. male.  57 year old male with a history of MI, dyslipidemia, COPD, chronic systolic CHF, coronary artery disease, left obstructing colon cancer status post colectomy presents to the emergency department for evaluation of abdominal pain.  He is 13 days status post ostomy takedown by Dr. [name].  He states that he has had persistent abdominal pain since surgery, especially over the last 5 days.  Pain is aggravated with prolonged periods of ambulation.  He is unaware if the pain heightens prior to a bowel movement.  He is complaining also of persistent diarrhea characterized as looser stool.  Bowel movements have been nonbloody.  He states that he has had at least 20 bowel movements today.  He has not had any nausea or vomiting since discharge.  Denies associated fevers, urinary symptoms.  He has not been taking any medications for his abdominal pain and was discharged only on Tylenol due to patient preference.  He has not had any redness or drainage from his surgical incision sites.  He is also specifically requesting that his staples be removed.  He is scheduled to follow-up with general surgery tomorrow, but does not feel as though he has the strength or ability to be at this appointment as driving is difficult.     Past Medical History:  Diagnosis Date  . Anemia   . Anxiety   . Automatic implantable cardioverter-defibrillator in situ    MDT Aug 2015 Dr. Lovena Le  . CAD (coronary artery disease), native coronary artery 07/03/2013   Cath 06/20/13  Normal left main, occluded LAD, occluded RCA, 50% circ EF 15%  3.0 x28 and 3.0 x 8 mm Xience stent Dr. Tamala Julian  To LAD   . Cancer ([place])   . Chronic kidney disease   . Chronic systolic CHF (congestive  heart failure) (HCC)    ECHO 08/05/13  EF 30%  Anterior akinesis and inferior hypokinesis   . COPD (chronic obstructive pulmonary disease) ([place])   . Hyperlipidemia   . Mass of colon 04/2016  . Myocardial infarction ([place]) 04/29/2013  . Thrombocytopenia ([place]) 12/27/2014   Chronic      Patient Active Problem List   Diagnosis Date Noted  . S/P colostomy takedown 05/20/2017  . History of colon cancer   . Benign neoplasm of cecum   . Iron deficiency anemia due to chronic blood loss 07/02/2016  . Wound disruption, post-op, skin, sequela 06/14/2016  . Adenocarcinoma of descending colon  pT3, pN1a, pMX s/p colectomy/ostomy 05/18/2016 05/26/2016  . Partial bowel obstruction ([place]) 05/16/2016  . Essential hypertension 05/16/2016  . Anemia due to blood loss 05/03/2016  . Anxiety state 01/17/2015  . Cardiomyopathy, ischemic   . Thrombocytopenia ([place]) 12/27/2014  . AICD (automatic cardioverter/defibrillator) present   . Financial difficulties 10/01/2013  . CAD (coronary artery disease), native coronary artery 07/03/2013  . Tobacco use 07/03/2013  . Chronic systolic CHF (congestive heart failure) ([place])   . Hyperlipidemia   . Old anterior myocardial infarction 06/20/2013    Past Surgical History:  Procedure Laterality Date  . CARDIAC CATHETERIZATION  05/2013   . CARDIAC CATHETERIZATION N/A 12/28/2014   Procedure: Left Heart Cath and Coronary Angiography;  Surgeon: [name], MD;  Location: [facility];  Service: Cardiovascular;  Laterality: N/A;  . CARDIAC CATHETERIZATION N/A 12/28/2014   Procedure: Coronary Stent Intervention;  Surgeon: [name], MD;  Location: [facility];  Service: Cardiovascular;  Laterality: N/A;  . COLECTOMY WITH COLOSTOMY CREATION/HARTMANN PROCEDURE Left 05/18/2016   Procedure: COLECTOMY WITH OSTOMY CREATION/HARTMANN PROCEDURE;  Surgeon: [name], MD;  Location: [facility];  Service: General;  Laterality: Left;  . COLONOSCOPY WITH PROPOFOL N/A  04/01/2017   Procedure: COLONOSCOPY WITH PROPOFOL;  Surgeon: [name], MD;  Location: [facility];  Service: Gastroenterology;  Laterality: N/A;  . COLOSTOMY TAKEDOWN N/A 05/20/2017   Procedure: COLOSTOMY TAKEDOWN;  Surgeon: [name], MD;  Location: [facility];  Service: General;  Laterality: N/A;  . CORONARY ANGIOPLASTY  05/2013   . FLEXIBLE SIGMOIDOSCOPY N/A 05/16/2016   Procedure: FLEXIBLE SIGMOIDOSCOPY;  Surgeon: [name], MD;  Location: [facility];  Service: Gastroenterology;  Laterality: N/A;  . ICD placement  09/07/2013   . IMPLANTABLE CARDIOVERTER DEFIBRILLATOR IMPLANT N/A 09/07/2013   Procedure: IMPLANTABLE CARDIOVERTER DEFIBRILLATOR IMPLANT;  Surgeon: [name], MD;  Location: [facility] CATH LAB;  Service: Cardiovascular;  Laterality: N/A;  . INTRA-AORTIC BALLOON PUMP INSERTION  06/20/2013   Procedure: INTRA-AORTIC BALLOON PUMP INSERTION;  Surgeon: Sinclair Grooms, MD;  Location: [facility] CATH LAB;  Service: Cardiovascular;;  . LEFT HEART CATHETERIZATION WITH CORONARY ANGIOGRAM N/A 06/20/2013   Procedure: LEFT HEART CATHETERIZATION WITH CORONARY ANGIOGRAM;  Surgeon: Sinclair Grooms, MD;  Location: [facility] CATH LAB;  Service: Cardiovascular;  Laterality: N/A;  . PERCUTANEOUS CORONARY STENT INTERVENTION (PCI-S)  06/20/2013   Procedure: PERCUTANEOUS CORONARY STENT INTERVENTION (PCI-S);  Surgeon: Sinclair Grooms, MD;  Location: [facility] CATH LAB;  Service: Cardiovascular;;        Home Medications    Prior to Admission medications   Medication Sig Start Date End Date Taking? Authorizing Provider  acetaminophen (TYLENOL) 325 MG tablet Take 2 tablets (650 mg total) by mouth every 4 (four) hours as needed. Patient taking differently: Take 650 mg by mouth every 4 (four) hours as needed for mild pain, moderate pain or headache.  05/27/17 05/27/18 Yes [name], MD  aspirin 81 MG chewable tablet Chew 1 tablet (81 mg total) by mouth daily. 09/09/13  Yes Theodis Blaze, MD  atorvastatin  (LIPITOR) 80 MG tablet Take 1 tablet (80 mg total) by mouth daily at 6 PM. 09/09/13  Yes Theodis Blaze, MD  carvedilol (COREG) 3.125 MG tablet Take 1 tablet (3.125 mg total) by mouth 2 (two) times daily with a meal. 05/29/16  Yes Velvet Bathe, MD  clopidogrel (PLAVIX) 75 MG tablet Take 75 mg by mouth daily.   Yes [provider]  digoxin (LANOXIN) 0.125 MG tablet Take 1 tablet (0.125 mg total) by mouth daily. 09/09/13  Yes Theodis Blaze, MD  lisinopril (PRINIVIL,ZESTRIL) 2.5 MG tablet Take 1 tablet (2.5 mg total) by mouth daily. 09/09/13  Yes Theodis Blaze, MD  Multiple Vitamin (MULTIVITAMIN WITH MINERALS) TABS tablet Take 1 tablet by mouth daily. 05/30/16  Yes Velvet Bathe, MD  nitroGLYCERIN (NITROSTAT) 0.4 MG SL tablet Place 1 tablet (0.4 mg total) under the tongue every 5 (five) minutes as needed for chest pain. 05/29/16  Yes Velvet Bathe, MD  ferrous sulfate 325 (65 FE) MG tablet Take 1 tablet (325 mg total) by mouth 2 (two) times daily with a meal. Patient not taking: Reported on 06/02/2017 05/03/16   [name], MD  saccharomyces boulardii (FLORASTOR) 250  MG capsule Take 1 capsule (250 mg total) by mouth 2 (two) times daily. Patient not taking: Reported on 06/02/2017 05/29/16   Velvet Bathe, MD    Family History Family History  Problem Relation Age of Onset  . Emphysema Father   . Heart disease Mother   . Dementia Mother   . Diabetes Mother   . Pancreatic cancer Maternal Grandmother   . Lung cancer Maternal Grandmother   . Cancer Neg Hx     Social History Social History   Tobacco Use  . Smoking status: Former Smoker    Packs/day: 2.00    Years: 40.00    Pack years: 80.00    Types: Cigarettes    Last attempt to quit: 12/22/2014    Years since quitting: 2.4  . Smokeless tobacco: Never Used  Substance Use Topics  . Alcohol use: No    Comment: quit in 08/2013, he used to drink alcohol moderate (1 pine liquor one week)  for 30 years   . Drug use: No     Allergies     Capecitabine; Celebrex [celecoxib]; and Contrast media [iodinated diagnostic agents]   Review of Systems Review of Systems Ten systems reviewed and are negative for acute change, except as noted in the HPI.    Physical Exam Updated Vital Signs BP 102/77   Pulse 91   Temp (!) 97.4 F (36.3 C) (Oral)   Resp 18   SpO2 98%   Physical Exam  Constitutional: He is oriented to person, place, and time. He appears well-developed and well-nourished. No distress.  Chronically ill-appearing, nontoxic  HENT:  Head: Normocephalic and atraumatic.  Eyes: Conjunctivae and EOM are normal. No scleral icterus.  Neck: Normal range of motion.  Cardiovascular: Regular rhythm and intact distal pulses.  Mild tachycardia; intermittent  Pulmonary/Chest: Effort normal. No respiratory distress.  Lungs clear to auscultation bilaterally.  Chest expansion symmetric.  Abdominal:    Abdomen soft with normoactive bowel sounds in all quadrants.  No focal abdominal tenderness on exam.  No peritoneal signs.  Musculoskeletal: Normal range of motion.  Neurological: He is alert and oriented to person, place, and time. He exhibits normal muscle tone. Coordination normal.  GCS 15.  Moving all extremities.  Ambulatory with steady gait with minimal assistance.  Skin: Skin is warm and dry. No rash noted. He is not diaphoretic. No erythema. No pallor.  Psychiatric: He has a normal mood and affect. His behavior is normal.  Nursing note and vitals reviewed.    ED Treatments / Results  Labs (all labs ordered are listed, but only abnormal results are displayed) Labs Reviewed  COMPREHENSIVE METABOLIC PANEL - Abnormal; Notable for the following components:      Result Value   Chloride 100 (*)    CO2 21 (*)    BUN 30 (*)    Creatinine, Ser 1.50 (*)    Albumin 3.4 (*)    GFR calc non Af Amer 51 (*)    GFR calc Af Amer 59 (*)    All other components within normal limits  CBC - Abnormal; Notable for the following  components:   WBC 14.1 (*)    All other components within normal limits  URINALYSIS, ROUTINE W REFLEX MICROSCOPIC - Abnormal; Notable for the following components:   Hgb urine dipstick SMALL (*)    Ketones, ur 20 (*)    Bacteria, UA RARE (*)    All other components within normal limits  C DIFFICILE QUICK SCREEN W PCR REFLEX  GASTROINTESTINAL PANEL BY PCR, STOOL (REPLACES STOOL CULTURE)  LIPASE, BLOOD    EKG None  Radiology Ct Abdomen Pelvis Wo Contrast  Result Date: 06/02/2017 CLINICAL DATA:  57 y/o M; 13 days status post colostomy takedown. Diarrhea following colon surgery. History of colon cancer. EXAM: CT ABDOMEN AND PELVIS WITHOUT CONTRAST TECHNIQUE: Multidetector CT imaging of the abdomen and pelvis was performed following the standard protocol without IV contrast. COMPARISON:  01/14/2017 CT abdomen and pelvis. FINDINGS: Lower chest: No acute abnormality. Hepatobiliary: No focal liver abnormality is seen. No gallstones, gallbladder wall thickening, or biliary dilatation. Pancreas: Unremarkable. No pancreatic ductal dilatation or surrounding inflammatory changes. Spleen: Normal in size without focal abnormality. Adrenals/Urinary Tract: Normal adrenal glands. Bilateral renal cysts measuring up to 4.2 cm in left interpolar kidney. No hydronephrosis. Normal bladder. Stomach/Bowel: Partial colectomy with anastomosis in the left hemiabdomen. There is mild edema surrounding the site of anastomosis and within the left pericolic gutter. No bowel wall thickening or obstructive changes identified. Vascular/Lymphatic: Aortic atherosclerosis. No enlarged abdominal or pelvic lymph nodes. Reproductive: Small prostatic calcifications. Other: Postsurgical changes are present within the anterior abdominal wall with multiple skin staples and edema. No fluid discrete collection identified. Musculoskeletal: No fracture is seen. IMPRESSION: 1. Postsurgical wounds in the anterior abdominal wall with skin staples  and edema. No dehiscence or discrete fluid collection. 2. Partial colectomy with anastomosis in left hemiabdomen. Mild edema surrounding site of anastomosis, probably postsurgical changes. 3. No abscess, bowel wall thickening, or obstruction identified. Electronically Signed   By: Kristine Garbe M.D.   On: 06/02/2017 06:14   Dg Abd 2 Views  Result Date: 06/02/2017 CLINICAL DATA:  Diarrhea after colon surgery EXAM: ABDOMEN - 2 VIEW COMPARISON:  CT 01/14/2017 FINDINGS: Lung bases are clear. No free air beneath the diaphragm. Nonobstructed gas pattern with decreased central small bowel gas and scattered colon gas. Postsurgical changes in the left hemiabdomen. Cutaneous staples over the midline and left abdomen. IMPRESSION: Nonobstructed bowel-gas pattern Electronically Signed   By: Donavan Foil M.D.   On: 06/02/2017 03:19    Procedures Procedures (including critical care time)  Medications Ordered in ED Medications  sodium chloride 0.9 % bolus 1,000 mL (0 mLs Intravenous Stopped 06/02/17 0230)  sodium chloride 0.9 % bolus 1,000 mL (0 mLs Intravenous Stopped 06/02/17 0348)  ondansetron (ZOFRAN) injection 4 mg (4 mg Intravenous Given 06/02/17 0500)  morphine 4 MG/ML injection 4 mg (4 mg Intravenous Given 06/02/17 0500)     5:13 AM Case discussed with Dr. Marcello Moores.  She does believe CT would be indicated given leukocytosis.  Dr. Marcello Moores recommends imaging with rectal contrast which, hopefully, the patient will be more amenable to.  5:25 AM Patient has agreed to proceed with CT with rectal contrast.  Order placed.  8:05 AM Patient feeling much better after receiving morphine and Zofran.  He states that his abdominal pain has resolved.  CT scan shows postsurgical wound changes in the anterior abdominal wall without dehiscence or fluid collection.  There is mild edema at the site of anastomosis favoring postsurgical changes.  No other acute process in the abdomen or pelvis identified.  Given  reassuring imaging and blood work, plan for discharge with outpatient surgical follow-up.  I have discussed the plan of supportive care with Bentyl and Zofran.  Patient verbalizes comfort and understanding with plan.  He has been instructed to follow-up at his scheduled appointment tomorrow for staple removal.  Return precautions discussed and provided. Patient discharged in stable condition with  no unaddressed concerns.   Initial Impression / Assessment and Plan / ED Course  I have reviewed the triage vital signs and the nursing notes.  Pertinent labs & imaging results that were available during my care of the patient were reviewed by me and considered in my medical decision making (see chart for details).     57 year old male 13 days status post colostomy takedown presents to the emergency department for generalized abdominal pain and persistent diarrhea.  He has had no associated fevers or vomiting.  Patient hemodynamically stable in the emergency department.  His leukocytosis is stable compared with prior evaluations.  Hydrated for mild acute kidney injury.  Patient underwent CT scan with rectal contrast.  This does not suggest any anastomotic leak.  There are mild signs of inflammation at the anastomotic site which is favored to represent postoperative changes.  No other acute process identified within the abdomen or pelvis.  Patient feels much better after receiving IV medications and fluids.  Plan to discharge with prescriptions for Zofran and Bentyl for symptomatic management.  He has follow-up scheduled tomorrow with general surgery.  The patient has been encouraged to keep this appointment.  Return precautions discussed and provided. Patient discharged in stable condition with no unaddressed concerns.   Final Clinical Impressions(s) / ED Diagnoses   Final diagnoses:  Abdominal pain    ED Discharge Orders    None       Antonietta Breach, PA-C 06/02/17 [id number]    Shanon Rosser,  MD 06/02/17 [phone]

## 2017-06-02 NOTE — ED Notes (Signed)
Warm blanket given per request call light in reach alert and oriented x 3 no respiratory or acute distress noted.

## 2017-06-02 NOTE — ED Triage Notes (Addendum)
Pt reports diarrhea following colon surgery 12 days ago. He has a hx of colon cancer. Denies fever or vomiting. He endorses generalized abdominal pain and is requesting that his staples be removed.

## 2017-06-03 LAB — GASTROINTESTINAL PANEL BY PCR, STOOL (REPLACES STOOL CULTURE)
ADENOVIRUS F40/41: NOT DETECTED
ASTROVIRUS: NOT DETECTED
CAMPYLOBACTER SPECIES: NOT DETECTED
CRYPTOSPORIDIUM: NOT DETECTED
CYCLOSPORA CAYETANENSIS: NOT DETECTED
ENTAMOEBA HISTOLYTICA: NOT DETECTED
ENTEROPATHOGENIC E COLI (EPEC): NOT DETECTED
Enteroaggregative E coli (EAEC): NOT DETECTED
Enterotoxigenic E coli (ETEC): NOT DETECTED
GIARDIA LAMBLIA: NOT DETECTED
Norovirus GI/GII: NOT DETECTED
PLESIMONAS SHIGELLOIDES: NOT DETECTED
Rotavirus A: NOT DETECTED
Salmonella species: NOT DETECTED
Sapovirus (I, II, IV, and V): NOT DETECTED
Shiga like toxin producing E coli (STEC): NOT DETECTED
Shigella/Enteroinvasive E coli (EIEC): NOT DETECTED
VIBRIO CHOLERAE: NOT DETECTED
VIBRIO SPECIES: NOT DETECTED
YERSINIA ENTEROCOLITICA: NOT DETECTED

## 2017-06-12 ENCOUNTER — Inpatient Hospital Stay (HOSPITAL_COMMUNITY)
Admission: EM | Admit: 2017-06-12 | Discharge: 2017-06-17 | DRG: 393 | Disposition: A | Discharge status: Home / Self Care | Payer: Medicare Other | Attending: General Surgery | Admitting: General Surgery

## 2017-06-12 ENCOUNTER — Encounter (HOSPITAL_COMMUNITY): Payer: Self-pay

## 2017-06-12 ENCOUNTER — Other Ambulatory Visit: Payer: Self-pay

## 2017-06-12 ENCOUNTER — Emergency Department (HOSPITAL_COMMUNITY): Payer: Medicare Other

## 2017-06-12 DIAGNOSIS — F411 Generalized anxiety disorder: Secondary | ICD-10-CM | POA: Diagnosis present

## 2017-06-12 DIAGNOSIS — J449 Chronic obstructive pulmonary disease, unspecified: Secondary | ICD-10-CM | POA: Diagnosis present

## 2017-06-12 DIAGNOSIS — E785 Hyperlipidemia, unspecified: Secondary | ICD-10-CM | POA: Diagnosis present

## 2017-06-12 DIAGNOSIS — K9189 Other postprocedural complications and disorders of digestive system: Secondary | ICD-10-CM | POA: Diagnosis present

## 2017-06-12 DIAGNOSIS — Z79899 Other long term (current) drug therapy: Secondary | ICD-10-CM | POA: Diagnosis not present

## 2017-06-12 DIAGNOSIS — T8143XA Infection following a procedure, organ and space surgical site, initial encounter: Secondary | ICD-10-CM

## 2017-06-12 DIAGNOSIS — Z95810 Presence of automatic (implantable) cardiac defibrillator: Secondary | ICD-10-CM | POA: Diagnosis not present

## 2017-06-12 DIAGNOSIS — Z599 Problem related to housing and economic circumstances, unspecified: Secondary | ICD-10-CM

## 2017-06-12 DIAGNOSIS — T8149XA Infection following a procedure, other surgical site, initial encounter: Secondary | ICD-10-CM | POA: Diagnosis not present

## 2017-06-12 DIAGNOSIS — I130 Hypertensive heart and chronic kidney disease with heart failure and stage 1 through stage 4 chronic kidney disease, or unspecified chronic kidney disease: Secondary | ICD-10-CM | POA: Diagnosis present

## 2017-06-12 DIAGNOSIS — N183 Chronic kidney disease, stage 3 (moderate): Secondary | ICD-10-CM | POA: Diagnosis present

## 2017-06-12 DIAGNOSIS — Z9889 Other specified postprocedural states: Secondary | ICD-10-CM

## 2017-06-12 DIAGNOSIS — E860 Dehydration: Secondary | ICD-10-CM | POA: Diagnosis present

## 2017-06-12 DIAGNOSIS — Z85038 Personal history of other malignant neoplasm of large intestine: Secondary | ICD-10-CM

## 2017-06-12 DIAGNOSIS — I5022 Chronic systolic (congestive) heart failure: Secondary | ICD-10-CM | POA: Diagnosis present

## 2017-06-12 DIAGNOSIS — I110 Hypertensive heart disease with heart failure: Secondary | ICD-10-CM | POA: Diagnosis present

## 2017-06-12 DIAGNOSIS — I2510 Atherosclerotic heart disease of native coronary artery without angina pectoris: Secondary | ICD-10-CM | POA: Diagnosis present

## 2017-06-12 DIAGNOSIS — R112 Nausea with vomiting, unspecified: Secondary | ICD-10-CM | POA: Diagnosis not present

## 2017-06-12 DIAGNOSIS — Z7901 Long term (current) use of anticoagulants: Secondary | ICD-10-CM

## 2017-06-12 DIAGNOSIS — Y832 Surgical operation with anastomosis, bypass or graft as the cause of abnormal reaction of the patient, or of later complication, without mention of misadventure at the time of the procedure: Secondary | ICD-10-CM | POA: Diagnosis not present

## 2017-06-12 DIAGNOSIS — K651 Peritoneal abscess: Secondary | ICD-10-CM

## 2017-06-12 DIAGNOSIS — K2970 Gastritis, unspecified, without bleeding: Secondary | ICD-10-CM | POA: Diagnosis not present

## 2017-06-12 DIAGNOSIS — Z87891 Personal history of nicotine dependence: Secondary | ICD-10-CM | POA: Diagnosis not present

## 2017-06-12 DIAGNOSIS — I255 Ischemic cardiomyopathy: Secondary | ICD-10-CM | POA: Diagnosis present

## 2017-06-12 DIAGNOSIS — I252 Old myocardial infarction: Secondary | ICD-10-CM

## 2017-06-12 DIAGNOSIS — N179 Acute kidney failure, unspecified: Secondary | ICD-10-CM | POA: Diagnosis present

## 2017-06-12 DIAGNOSIS — Z9049 Acquired absence of other specified parts of digestive tract: Secondary | ICD-10-CM

## 2017-06-12 DIAGNOSIS — R197 Diarrhea, unspecified: Secondary | ICD-10-CM | POA: Diagnosis not present

## 2017-06-12 DIAGNOSIS — R1110 Vomiting, unspecified: Secondary | ICD-10-CM | POA: Diagnosis not present

## 2017-06-12 DIAGNOSIS — Z598 Other problems related to housing and economic circumstances: Secondary | ICD-10-CM

## 2017-06-12 DIAGNOSIS — Z7902 Long term (current) use of antithrombotics/antiplatelets: Secondary | ICD-10-CM | POA: Diagnosis not present

## 2017-06-12 DIAGNOSIS — C186 Malignant neoplasm of descending colon: Secondary | ICD-10-CM | POA: Diagnosis present

## 2017-06-12 HISTORY — DX: Long term (current) use of anticoagulants: Z79.01

## 2017-06-12 HISTORY — DX: Peritoneal abscess: K65.1

## 2017-06-12 LAB — COMPREHENSIVE METABOLIC PANEL
ALBUMIN: 3.2 g/dL — AB (ref 3.5–5.0)
ALT: 20 U/L (ref 17–63)
ANION GAP: 15 (ref 5–15)
AST: 17 U/L (ref 15–41)
Alkaline Phosphatase: 52 U/L (ref 38–126)
BUN: 36 mg/dL — ABNORMAL HIGH (ref 6–20)
CHLORIDE: 100 mmol/L — AB (ref 101–111)
CO2: 18 mmol/L — AB (ref 22–32)
Calcium: 9.2 mg/dL (ref 8.9–10.3)
Creatinine, Ser: 1.69 mg/dL — ABNORMAL HIGH (ref 0.61–1.24)
GFR calc Af Amer: 51 mL/min — ABNORMAL LOW (ref >60)
GFR calc non Af Amer: 44 mL/min — ABNORMAL LOW (ref >60)
GLUCOSE: 125 mg/dL — AB (ref 65–99)
POTASSIUM: 5.3 mmol/L — AB (ref 3.5–5.1)
SODIUM: 133 mmol/L — AB (ref 135–145)
Total Bilirubin: 0.7 mg/dL (ref 0.3–1.2)
Total Protein: 6.9 g/dL (ref 6.5–8.1)

## 2017-06-12 LAB — URINALYSIS, ROUTINE W REFLEX MICROSCOPIC
BILIRUBIN URINE: NEGATIVE
Glucose, UA: NEGATIVE mg/dL
HGB URINE DIPSTICK: NEGATIVE
Ketones, ur: 20 mg/dL — AB
Leukocytes, UA: NEGATIVE
Nitrite: NEGATIVE
PH: 5 (ref 5.0–8.0)
Protein, ur: NEGATIVE mg/dL
SPECIFIC GRAVITY, URINE: 1.021 (ref 1.005–1.030)

## 2017-06-12 LAB — LIPASE, BLOOD: LIPASE: 27 U/L (ref 11–51)

## 2017-06-12 LAB — CBC
HEMATOCRIT: 41.4 % (ref 39.0–52.0)
HEMOGLOBIN: 14.3 g/dL (ref 13.0–17.0)
MCH: 31.2 pg (ref 26.0–34.0)
MCHC: 34.5 g/dL (ref 30.0–36.0)
MCV: 90.4 fL (ref 78.0–100.0)
Platelets: 270 10*3/uL (ref 150–400)
RBC: 4.58 MIL/uL (ref 4.22–5.81)
RDW: 14.1 % (ref 11.5–15.5)
WBC: 11.3 10*3/uL — ABNORMAL HIGH (ref 4.0–10.5)

## 2017-06-12 MED ORDER — ASPIRIN 81 MG PO CHEW
81.0000 mg | CHEWABLE_TABLET | Freq: Every day | ORAL | Status: DC
  Administered 2017-06-13 – 2017-06-17 (×5): 81 mg via ORAL
  Filled 2017-06-12 (×5): qty 1

## 2017-06-12 MED ORDER — MENTHOL 3 MG MT LOZG: 1.0000 | LOZENGE | OROMUCOSAL | Status: DC | PRN

## 2017-06-12 MED ORDER — METHOCARBAMOL 1000 MG/10ML IJ SOLN
1000.0000 mg | Freq: Four times a day (QID) | INTRAVENOUS | Status: DC | PRN
  Filled 2017-06-12: qty 10

## 2017-06-12 MED ORDER — OXYCODONE-ACETAMINOPHEN 5-325 MG PO TABS: 1.0000 | ORAL_TABLET | Freq: Once | ORAL | Status: DC

## 2017-06-12 MED ORDER — PSYLLIUM 95 % PO PACK
1.0000 | PACK | Freq: Every day | ORAL | Status: DC
  Filled 2017-06-12 (×6): qty 1

## 2017-06-12 MED ORDER — ONDANSETRON HCL 4 MG/2ML IJ SOLN
4.0000 mg | Freq: Four times a day (QID) | INTRAMUSCULAR | Status: DC | PRN
  Administered 2017-06-13 – 2017-06-15 (×3): 4 mg via INTRAVENOUS
  Filled 2017-06-12 (×3): qty 2

## 2017-06-12 MED ORDER — HYDROCORTISONE 2.5 % RE CREA
1.0000 "application " | TOPICAL_CREAM | Freq: Four times a day (QID) | RECTAL | Status: DC | PRN
  Filled 2017-06-12: qty 28.35

## 2017-06-12 MED ORDER — ENSURE SURGERY PO LIQD
237.0000 mL | Freq: Two times a day (BID) | ORAL | Status: DC
  Filled 2017-06-12 (×7): qty 237

## 2017-06-12 MED ORDER — HYDROCORTISONE 1 % EX CREA
1.0000 "application " | TOPICAL_CREAM | Freq: Three times a day (TID) | CUTANEOUS | Status: DC | PRN
  Filled 2017-06-12: qty 28

## 2017-06-12 MED ORDER — ONDANSETRON HCL 4 MG/2ML IJ SOLN
4.0000 mg | Freq: Once | INTRAMUSCULAR | Status: AC
  Administered 2017-06-12: 4 mg via INTRAVENOUS
  Filled 2017-06-12: qty 2

## 2017-06-12 MED ORDER — ADULT MULTIVITAMIN W/MINERALS CH
1.0000 | ORAL_TABLET | Freq: Every day | ORAL | Status: DC
  Filled 2017-06-12 (×4): qty 1

## 2017-06-12 MED ORDER — LACTATED RINGERS IV SOLN
INTRAVENOUS | Status: DC
  Administered 2017-06-12 – 2017-06-15 (×7): via INTRAVENOUS

## 2017-06-12 MED ORDER — SODIUM CHLORIDE 0.9 % IV BOLUS
1000.0000 mL | Freq: Once | INTRAVENOUS | Status: AC
  Administered 2017-06-12: 1000 mL via INTRAVENOUS

## 2017-06-12 MED ORDER — OXYCODONE HCL 5 MG PO TABS
5.0000 mg | ORAL_TABLET | ORAL | Status: DC | PRN
  Administered 2017-06-12: 10 mg via ORAL
  Filled 2017-06-12: qty 2

## 2017-06-12 MED ORDER — LACTATED RINGERS IV BOLUS: 1000.0000 mL | Freq: Once | INTRAVENOUS | Status: DC

## 2017-06-12 MED ORDER — PHENOL 1.4 % MT LIQD: 1.0000 | OROMUCOSAL | Status: DC | PRN

## 2017-06-12 MED ORDER — ACETAMINOPHEN 325 MG PO TABS: 650.0000 mg | ORAL_TABLET | Freq: Four times a day (QID) | ORAL | Status: DC | PRN

## 2017-06-12 MED ORDER — GUAIFENESIN-DM 100-10 MG/5ML PO SYRP: 10.0000 mL | ORAL_SOLUTION | ORAL | Status: DC | PRN

## 2017-06-12 MED ORDER — HYDROMORPHONE HCL 2 MG/ML IJ SOLN
0.5000 mg | INTRAMUSCULAR | Status: DC | PRN
  Administered 2017-06-13: 1 mg via INTRAVENOUS
  Administered 2017-06-13: 0.5 mg via INTRAVENOUS
  Administered 2017-06-14: 1 mg via INTRAVENOUS
  Administered 2017-06-14: 2 mg via INTRAVENOUS
  Administered 2017-06-14 – 2017-06-15 (×2): 1 mg via INTRAVENOUS
  Administered 2017-06-15 – 2017-06-16 (×2): 2 mg via INTRAVENOUS
  Administered 2017-06-16: 1 mg via INTRAVENOUS
  Filled 2017-06-12 (×9): qty 1

## 2017-06-12 MED ORDER — NITROGLYCERIN 0.4 MG SL SUBL: 0.4000 mg | SUBLINGUAL_TABLET | SUBLINGUAL | Status: DC | PRN

## 2017-06-12 MED ORDER — CARVEDILOL 3.125 MG PO TABS
3.1250 mg | ORAL_TABLET | Freq: Two times a day (BID) | ORAL | Status: DC
  Administered 2017-06-13 – 2017-06-17 (×9): 3.125 mg via ORAL
  Filled 2017-06-12 (×10): qty 1

## 2017-06-12 MED ORDER — SACCHAROMYCES BOULARDII 250 MG PO CAPS
250.0000 mg | ORAL_CAPSULE | Freq: Two times a day (BID) | ORAL | Status: DC
  Administered 2017-06-13 – 2017-06-15 (×3): 250 mg via ORAL
  Filled 2017-06-12 (×7): qty 1

## 2017-06-12 MED ORDER — DIGOXIN 125 MCG PO TABS
0.1250 mg | ORAL_TABLET | Freq: Every day | ORAL | Status: DC
  Administered 2017-06-13 – 2017-06-17 (×5): 0.125 mg via ORAL
  Filled 2017-06-12 (×5): qty 1

## 2017-06-12 MED ORDER — ATORVASTATIN CALCIUM 80 MG PO TABS
80.0000 mg | ORAL_TABLET | Freq: Every day | ORAL | Status: DC
  Administered 2017-06-13 – 2017-06-16 (×4): 80 mg via ORAL
  Filled 2017-06-12 (×5): qty 1

## 2017-06-12 MED ORDER — BLISTEX MEDICATED EX OINT
1.0000 "application " | TOPICAL_OINTMENT | Freq: Two times a day (BID) | CUTANEOUS | Status: DC
  Administered 2017-06-13 – 2017-06-15 (×5): 1 via TOPICAL
  Filled 2017-06-12: qty 7

## 2017-06-12 MED ORDER — METHOCARBAMOL 500 MG PO TABS: 1000.0000 mg | ORAL_TABLET | Freq: Four times a day (QID) | ORAL | Status: DC | PRN

## 2017-06-12 MED ORDER — SIMETHICONE 80 MG PO CHEW: 40.0000 mg | CHEWABLE_TABLET | Freq: Four times a day (QID) | ORAL | Status: DC | PRN

## 2017-06-12 MED ORDER — ACETAMINOPHEN 650 MG RE SUPP: 650.0000 mg | Freq: Four times a day (QID) | RECTAL | Status: DC | PRN

## 2017-06-12 MED ORDER — PROCHLORPERAZINE EDISYLATE 10 MG/2ML IJ SOLN: 5.0000 mg | INTRAMUSCULAR | Status: DC | PRN

## 2017-06-12 MED ORDER — GABAPENTIN 300 MG PO CAPS
300.0000 mg | ORAL_CAPSULE | Freq: Every day | ORAL | Status: AC
  Filled 2017-06-12 (×3): qty 1

## 2017-06-12 MED ORDER — MORPHINE SULFATE (PF) 4 MG/ML IV SOLN
4.0000 mg | Freq: Once | INTRAVENOUS | Status: AC
  Administered 2017-06-12: 4 mg via INTRAVENOUS
  Filled 2017-06-12: qty 1

## 2017-06-12 MED ORDER — LISINOPRIL 2.5 MG PO TABS
2.5000 mg | ORAL_TABLET | Freq: Every day | ORAL | Status: DC
  Administered 2017-06-16 – 2017-06-17 (×2): 2.5 mg via ORAL
  Filled 2017-06-12 (×5): qty 1

## 2017-06-12 MED ORDER — MAGIC MOUTHWASH: 15.0000 mL | Freq: Four times a day (QID) | ORAL | Status: DC | PRN

## 2017-06-12 MED ORDER — PIPERACILLIN-TAZOBACTAM 3.375 G IVPB
3.3750 g | Freq: Three times a day (TID) | INTRAVENOUS | Status: DC
  Administered 2017-06-13 – 2017-06-17 (×13): 3.375 g via INTRAVENOUS
  Filled 2017-06-12 (×16): qty 50

## 2017-06-12 MED ORDER — DIPHENHYDRAMINE HCL 50 MG/ML IJ SOLN: 12.5000 mg | Freq: Four times a day (QID) | INTRAMUSCULAR | Status: DC | PRN

## 2017-06-12 MED ORDER — BARIUM SULFATE 2.1 % PO SUSP: 450.0000 mL | Freq: Two times a day (BID) | ORAL | Status: DC

## 2017-06-12 MED ORDER — ONDANSETRON 4 MG PO TBDP: 4.0000 mg | ORAL_TABLET | Freq: Four times a day (QID) | ORAL | Status: DC | PRN

## 2017-06-12 MED ORDER — DIPHENHYDRAMINE HCL 12.5 MG/5ML PO ELIX: 12.5000 mg | ORAL_SOLUTION | Freq: Four times a day (QID) | ORAL | Status: DC | PRN

## 2017-06-12 MED ORDER — ALUM & MAG HYDROXIDE-SIMETH 200-200-20 MG/5ML PO SUSP: 30.0000 mL | Freq: Four times a day (QID) | ORAL | Status: DC | PRN

## 2017-06-12 MED ORDER — ONDANSETRON 4 MG PO TBDP: 4.0000 mg | ORAL_TABLET | Freq: Three times a day (TID) | ORAL | Status: DC | PRN

## 2017-06-12 MED ORDER — HYDROMORPHONE HCL 1 MG/ML IJ SOLN: 0.5000 mg | INTRAMUSCULAR | Status: DC | PRN

## 2017-06-12 MED ORDER — PIPERACILLIN-TAZOBACTAM 3.375 G IVPB 30 MIN
3.3750 g | Freq: Once | INTRAVENOUS | Status: AC
  Administered 2017-06-12: 3.375 g via INTRAVENOUS
  Filled 2017-06-12: qty 50

## 2017-06-12 MED ORDER — METOCLOPRAMIDE HCL 5 MG/ML IJ SOLN
10.0000 mg | Freq: Four times a day (QID) | INTRAMUSCULAR | Status: DC | PRN
  Administered 2017-06-13 – 2017-06-15 (×4): 10 mg via INTRAVENOUS
  Filled 2017-06-12 (×5): qty 2

## 2017-06-12 MED ORDER — LACTATED RINGERS IV BOLUS: 1000.0000 mL | Freq: Three times a day (TID) | INTRAVENOUS | Status: AC | PRN

## 2017-06-12 MED ORDER — BISMUTH SUBSALICYLATE 262 MG/15ML PO SUSP
30.0000 mL | Freq: Three times a day (TID) | ORAL | Status: DC | PRN
  Filled 2017-06-12: qty 118

## 2017-06-12 NOTE — ED Notes (Signed)
[facility] called  

## 2017-06-12 NOTE — ED Notes (Signed)
ED TO INPATIENT HANDOFF REPORT  Name/Age/Gender Christopher [name] 57 y.o. male  Code Status    Code Status Orders  (From admission, onward)        Start     Ordered   06/12/17 2055  Full code  Continuous     06/12/17 2059    Code Status History    Date Active Date Inactive Code Status Order ID Comments User Context   05/20/2017 1657 05/27/2017 1937 Full Code [id number]  [name], MD Inpatient   05/16/2016 1623 05/29/2016 1703 Full Code [id number]  Waldemar Dickens, MD Inpatient   12/28/2014 1055 12/29/2014 1532 Full Code [id number]  [name], MD Inpatient   12/27/2014 1710 12/28/2014 1055 Full Code [id number]  Willia Craze, NP Inpatient   12/22/2014 2131 12/24/2014 1937 Full Code [id number]  Sid Falcon, MD Inpatient   09/07/2013 0924 09/08/2013 1346 Full Code [id number]  [name], MD Inpatient   06/20/2013 1830 06/25/2013 1446 Full Code [id number]  Belva Crome, MD Inpatient      Home/SNF/Other Home  Chief Complaint abd pain   Level of Care/Admitting Diagnosis ED Disposition    ED Disposition Condition [place]: [place] [[id number]]  Level of Care: Med-Surg [16]  Diagnosis: Intra-abdominal abscess [facility]) [[id number]]  Admitting Physician: [name] [[id number]]  Attending Physician: [name] [[id number]]  Estimated length of stay: 5 - 7 days  Certification:: I certify this patient will need inpatient services for at least 2 midnights  Bed request comments: 6N preferred - gen surgery patient  PT Class (Do Not Modify): Inpatient [101]  PT Acc Code (Do Not Modify): Private [1]       Medical History Past Medical History:  Diagnosis Date  . Anemia   . Anxiety   . Automatic implantable cardioverter-defibrillator in situ    MDT Aug 2015 Dr. Lovena Le  . CAD (coronary artery disease), native coronary artery 07/03/2013   Cath 06/20/13  Normal left main, occluded LAD, occluded RCA, 50% circ EF 15%  3.0 x28 and 3.0 x 8 mm  Xience stent Dr. Tamala Julian  To LAD   . Cancer ([place])   . Chronic kidney disease   . Chronic systolic CHF (congestive heart failure) (HCC)    ECHO 08/05/13  EF 30%  Anterior akinesis and inferior hypokinesis   . COPD (chronic obstructive pulmonary disease) ([place])   . Hyperlipidemia   . Mass of colon 04/2016  . Myocardial infarction ([place]) 04/29/2013  . Thrombocytopenia ([place]) 12/27/2014   Chronic      Allergies Allergies  Allergen Reactions  . Capecitabine Other (See Comments)    Server dehydration and diarrhea      . Celebrex [Celecoxib] Other (See Comments)    Caused his back to burn  . Contrast Media [Iodinated Diagnostic Agents] Other (See Comments)    Patient feels weakness    IV Location/Drains/Wounds Patient Lines/Drains/Airways Status   Active Line/Drains/Airways    Name:   Placement date:   Placement time:   Site:   Days:   Peripheral IV 06/12/17 Right Antecubital   06/12/17    1306    Antecubital   less than 1   Peripheral IV 06/12/17 Right Wrist   06/12/17    2139    Wrist   less than 1   Incision (Closed) 05/20/17 Abdomen Other (Comment)   05/20/17    1438     23   Incision (Closed) 05/20/17 Abdomen Left  05/20/17    1451     23          Labs/Imaging Results for orders placed or performed during the hospital encounter of 06/12/17 (from the past 48 hour(s))  Lipase, blood     Status: None   Collection Time: 06/12/17  1:08 PM  Result Value Ref Range   Lipase 27 11 - 51 U/L    Comment: Performed at [facility], [place] [address] [place], [place] [address]  Comprehensive metabolic panel     Status: Abnormal   Collection Time: 06/12/17  1:08 PM  Result Value Ref Range   Sodium 133 (L) 135 - 145 mmol/L   Potassium 5.3 (H) 3.5 - 5.1 mmol/L   Chloride 100 (L) 101 - 111 mmol/L   CO2 18 (L) 22 - 32 mmol/L   Glucose, Bld 125 (H) 65 - 99 mg/dL   BUN 36 (H) 6 - 20 mg/dL   Creatinine, Ser 1.69 (H) 0.61 - 1.24 mg/dL   Calcium 9.2 8.9 - 10.3 mg/dL    Total Protein 6.9 6.5 - 8.1 g/dL   Albumin 3.2 (L) 3.5 - 5.0 g/dL   AST 17 15 - 41 U/L   ALT 20 17 - 63 U/L   Alkaline Phosphatase 52 38 - 126 U/L   Total Bilirubin 0.7 0.3 - 1.2 mg/dL   GFR calc non Af Amer 44 (L) >60 mL/min   GFR calc Af Amer 51 (L) >60 mL/min    Comment: (NOTE) The eGFR has been calculated using the CKD EPI equation. This calculation has not been validated in all clinical situations. eGFR's persistently <60 mL/min signify possible Chronic Kidney Disease.    Anion gap 15 5 - 15    Comment: Performed at [facility], [place] [address] [place], [place] [address]  CBC     Status: Abnormal   Collection Time: 06/12/17  1:08 PM  Result Value Ref Range   WBC 11.3 (H) 4.0 - 10.5 K/uL   RBC 4.58 4.22 - 5.81 MIL/uL   Hemoglobin 14.3 13.0 - 17.0 g/dL   HCT 41.4 39.0 - 52.0 %   MCV 90.4 78.0 - 100.0 fL   MCH 31.2 26.0 - 34.0 pg   MCHC 34.5 30.0 - 36.0 g/dL   RDW 14.1 11.5 - 15.5 %   Platelets 270 150 - 400 K/uL    Comment: Performed at [facility], [place] [address] [place], [place] [address]  Urinalysis, Routine w reflex microscopic     Status: Abnormal   Collection Time: 06/12/17  1:08 PM  Result Value Ref Range   Color, Urine YELLOW YELLOW   APPearance HAZY (A) CLEAR   Specific Gravity, Urine 1.021 1.005 - 1.030   pH 5.0 5.0 - 8.0   Glucose, UA NEGATIVE NEGATIVE mg/dL   Hgb urine dipstick NEGATIVE NEGATIVE   Bilirubin Urine NEGATIVE NEGATIVE   Ketones, ur 20 (A) NEGATIVE mg/dL   Protein, ur NEGATIVE NEGATIVE mg/dL   Nitrite NEGATIVE NEGATIVE   Leukocytes, UA NEGATIVE NEGATIVE    Comment: Performed at [facility] [address], [place], [place] [address]   Ct Abdomen Pelvis Wo Contrast  Result Date: 06/12/2017 CLINICAL DATA:  History of adenocarcinoma of the descending colon. Status post prior left colectomy in 2018 and more recent colostomy takedown on 05/20/2017. Ongoing abdominal pain, nausea, vomiting  and diarrhea since colostomy takedown. EXAM: CT ABDOMEN AND PELVIS WITHOUT CONTRAST TECHNIQUE: Multidetector CT imaging of the  abdomen and pelvis was performed following the standard protocol without IV contrast. COMPARISON:  06/02/2017 and 01/14/2017. FINDINGS: Lower chest: No acute abnormality. Hepatobiliary: No focal liver abnormality is seen. No gallstones, gallbladder wall thickening, or biliary dilatation. Pancreas: Unremarkable. No pancreatic ductal dilatation or surrounding inflammatory changes. Spleen: Normal in size without focal abnormality. Adrenals/Urinary Tract: Adrenal glands are unremarkable. Stable bilateral renal cysts. No hydronephrosis. Bladder is unremarkable. Stomach/Bowel: On the prior study, there appears to have been a focal collection of extraluminal gas and fluid immediately posterior to the left colic anastomosis measuring up to 5.8 cm in maximal diameter. This area shows significant improvement with some residual inflammation and soft tissue thickening present in a region measuring roughly 3.8 cm in diameter. A small amount of extraluminal air remains present in this region. Some adjacent inflammatory changes remain in the pericolonic fat. No evidence of free air. No bowel obstruction or significant ileus identified. Vascular/Lymphatic: Stable atherosclerosis of the abdominal aorta and iliac arteries. No evidence of abdominal aortic aneurysm. The proximal right common iliac artery shows mild focal dilatation measuring 16 mm. Distal right common iliac artery also mildly dilated, measuring 14 mm. The distal left common iliac artery measures 13 mm. No enlarged lymph nodes identified. Reproductive: Prostate is unremarkable. Other: Postsurgical changes again noted at the level of the left lower abdominal wall related to recent colostomy takedown. Less prominent postsurgical stranding in the subcutaneous fat. No evidence of abdominal wall hernia at the level of prior colostomy or elsewhere.  Musculoskeletal: No acute or significant osseous findings. IMPRESSION: Improving appearance to what may have represented a focal anastomotic leak posterior to the left colon at the level of reanastomosis after colostomy takedown. An area of probable extraluminal air and fluid is smaller and contains less extraluminal air. This is likely consistent with resolving abscess. Some residual inflammation remains in this region. No associated bowel obstruction or free air. Electronically Signed   By: Aletta Edouard M.D.   On: 06/12/2017 19:21    Pending Labs Unresulted Labs (From admission, onward)   Start     Ordered   06/13/17 0500  HIV antibody (Routine Testing)  Tomorrow morning,   R     06/12/17 2059   06/12/17 2000  Blood culture (routine x 2)  BLOOD CULTURE X 2,   STAT     06/12/17 1959      Vitals/Pain Today's Vitals   06/12/17 1550 06/12/17 1600 06/12/17 2004 06/12/17 2112  BP: 112/79 100/73 93/77   Pulse: 93 93 95   Resp: (!) _0 Temp:      TempSrc:      SpO2: 100% 98% 99%   PainSc:    9     Isolation Precautions No active isolations  Medications Medications  Barium Sulfate 2.1 % SUSP 450 mL (has no administration in time range)  multivitamin with minerals tablet 1 tablet (has no administration in time range)  saccharomyces boulardii (FLORASTOR) capsule 250 mg (has no administration in time range)  atorvastatin (LIPITOR) tablet 80 mg (has no administration in time range)  carvedilol (COREG) tablet 3.125 mg (has no administration in time range)  digoxin (LANOXIN) tablet 0.125 mg (has no administration in time range)  lisinopril (PRINIVIL,ZESTRIL) tablet 2.5 mg (has no administration in time range)  lactated ringers infusion (has no administration in time range)  acetaminophen (TYLENOL) tablet 650 mg (has no administration in time range)    Or  acetaminophen (TYLENOL) suppository 650 mg (has no administration in  time range)  diphenhydrAMINE (BENADRYL) 12.5 MG/5ML  elixir 12.5 mg (has no administration in time range)    Or  diphenhydrAMINE (BENADRYL) injection 12.5 mg (has no administration in time range)  ondansetron (ZOFRAN-ODT) disintegrating tablet 4 mg (has no administration in time range)    Or  ondansetron (ZOFRAN) injection 4 mg (has no administration in time range)  simethicone (MYLICON) chewable tablet 40 mg (has no administration in time range)  lactated ringers bolus 1,000 mL (has no administration in time range)  lactated ringers bolus 1,000 mL (has no administration in time range)  piperacillin-tazobactam (ZOSYN) IVPB 3.375 g (has no administration in time range)  HYDROmorphone (DILAUDID) injection 0.5-2 mg (has no administration in time range)  oxyCODONE (Oxy IR/ROXICODONE) immediate release tablet 5-10 mg (has no administration in time range)  methocarbamol (ROBAXIN) 1,000 mg in dextrose 5 % 50 mL IVPB (has no administration in time range)  methocarbamol (ROBAXIN) tablet 1,000 mg (has no administration in time range)  gabapentin (NEURONTIN) capsule 300 mg (has no administration in time range)  prochlorperazine (COMPAZINE) injection 5-10 mg (has no administration in time range)  metoCLOPramide (REGLAN) injection 10 mg (has no administration in time range)  lip balm (CARMEX) ointment 1 application (has no administration in time range)  magic mouthwash (has no administration in time range)  psyllium (HYDROCIL/METAMUCIL) packet 1 packet (has no administration in time range)  bismuth subsalicylate (PEPTO BISMOL) 262 MG/15ML suspension 30 mL (has no administration in time range)  feeding supplement (ENSURE SURGERY) liquid 237 mL (has no administration in time range)  guaiFENesin-dextromethorphan (ROBITUSSIN DM) 100-10 MG/5ML syrup 10 mL (has no administration in time range)  hydrocortisone (ANUSOL-HC) 2.5 % rectal cream 1 application (has no administration in time range)  alum & mag hydroxide-simeth (MAALOX/MYLANTA) 200-200-20 MG/5ML  suspension 30 mL (has no administration in time range)  hydrocortisone cream 1 % 1 application (has no administration in time range)  menthol-cetylpyridinium (CEPACOL) lozenge 3 mg (has no administration in time range)  phenol (CHLORASEPTIC) mouth spray 1-2 spray (has no administration in time range)  aspirin chewable tablet 81 mg (has no administration in time range)  nitroGLYCERIN (NITROSTAT) SL tablet 0.4 mg (has no administration in time range)  sodium chloride 0.9 % bolus 1,000 mL (0 mLs Intravenous Stopped 06/12/17 1525)  ondansetron (ZOFRAN) injection 4 mg (4 mg Intravenous Given 06/12/17 1344)  morphine 4 MG/ML injection 4 mg (4 mg Intravenous Given 06/12/17 1344)  sodium chloride 0.9 % bolus 1,000 mL (0 mLs Intravenous Stopped 06/12/17 2016)  morphine 4 MG/ML injection 4 mg (4 mg Intravenous Given 06/12/17 1622)  ondansetron (ZOFRAN) injection 4 mg (4 mg Intravenous Given 06/12/17 1657)  piperacillin-tazobactam (ZOSYN) IVPB 3.375 g (3.375 g Intravenous New Bag/Given 06/12/17 2111)    Mobility walks

## 2017-06-12 NOTE — ED Provider Notes (Signed)
[facility] DEPT Provider Note   CSN: [id number] Arrival date & time: 06/12/17  1238     History   Chief Complaint No chief complaint on file.   HPI Christopher [name] is a 57 y.o. male w/ h/o CAD s/p MI, heart failure, COPD, colon cancer s/p colectomy and colostomy take down on 05/20/17 by Dr [name] here for evaluation of abdominal pain.  Onset of pain since take down surgery. Pain is generalized but worse around ostomy site, severity 10/10, gradually worsening in the last week. Was seen in ER 10 days ago for similar had reassuring CT scan and discharged with f/u with Dr [name].  States he had an appointment today but cancelled it because he did not think he could drive due to pain. Associated symptoms include nausea and nbnb vomiting onset today x 3 times. States abdominal pain is worse with food intake, will have a cracker and feel it go down and abdominal pain worsens. But also states it hurts all the time.  Was given zofran and tylenol for symptom control but has not used them because they do not work for him.  Reports associated non bloody watery BM unchanged since colostomy take down.   Denies fevers, chills, Cp, SOB, cough, dysuria, hematemesis, hematochezia, melena.  HPI  Past Medical History:  Diagnosis Date  . Anemia   . Anxiety   . Automatic implantable cardioverter-defibrillator in situ    MDT Aug 2015 Dr. Lovena Le  . CAD (coronary artery disease), native coronary artery 07/03/2013   Cath 06/20/13  Normal left main, occluded LAD, occluded RCA, 50% circ EF 15%  3.0 x28 and 3.0 x 8 mm Xience stent Dr. Tamala Julian  To LAD   . Cancer ([place])   . Chronic kidney disease   . Chronic systolic CHF (congestive heart failure) (HCC)    ECHO 08/05/13  EF 30%  Anterior akinesis and inferior hypokinesis   . COPD (chronic obstructive pulmonary disease) ([place])   . Hyperlipidemia   . Mass of colon 04/2016  . Myocardial infarction ([place]) 04/29/2013  . Thrombocytopenia  ([place]) 12/27/2014   Chronic      Patient Active Problem List   Diagnosis Date Noted  . Chronic anticoagulation 06/12/2017  . Intra-abdominal abscess 06/12/2017  . S/P colostomy takedown 05/20/2017  . History of colon cancer   . Benign neoplasm of cecum   . Iron deficiency anemia due to chronic blood loss 07/02/2016  . Adenocarcinoma of descending colon  pT3, pN1a, pMX s/p colectomy/ostomy 05/18/2016 05/26/2016  . Partial bowel obstruction ([place]) 05/16/2016  . Essential hypertension 05/16/2016  . Anemia due to blood loss 05/03/2016  . Anxiety state 01/17/2015  . Cardiomyopathy, ischemic   . Thrombocytopenia ([place]) 12/27/2014  . AICD (automatic cardioverter/defibrillator) present   . Financial difficulties 10/01/2013  . CAD (coronary artery disease), native coronary artery 07/03/2013  . Tobacco use 07/03/2013  . Chronic systolic CHF (congestive heart failure) ([place])   . Hyperlipidemia   . Old anterior myocardial infarction 06/20/2013    Past Surgical History:  Procedure Laterality Date  . CARDIAC CATHETERIZATION  05/2013   . CARDIAC CATHETERIZATION N/A 12/28/2014   Procedure: Left Heart Cath and Coronary Angiography;  Surgeon: [name], MD;  Location: [facility];  Service: Cardiovascular;  Laterality: N/A;  . CARDIAC CATHETERIZATION N/A 12/28/2014   Procedure: Coronary Stent Intervention;  Surgeon: [name], MD;  Location: [facility] [place] [facility];  Service: Cardiovascular;  Laterality: N/A;  . COLECTOMY  WITH COLOSTOMY CREATION/HARTMANN PROCEDURE Left 05/18/2016   Procedure: COLECTOMY WITH OSTOMY CREATION/HARTMANN PROCEDURE;  Surgeon: [name], MD;  Location: [facility];  Service: General;  Laterality: Left;  . COLONOSCOPY WITH PROPOFOL N/A 04/01/2017   Procedure: COLONOSCOPY WITH PROPOFOL;  Surgeon: [name], MD;  Location: [facility];  Service: Gastroenterology;  Laterality: N/A;  . COLOSTOMY TAKEDOWN N/A 05/20/2017   Procedure: COLOSTOMY TAKEDOWN;  Surgeon:  [name], MD;  Location: [facility];  Service: General;  Laterality: N/A;  . CORONARY ANGIOPLASTY  05/2013   . FLEXIBLE SIGMOIDOSCOPY N/A 05/16/2016   Procedure: FLEXIBLE SIGMOIDOSCOPY;  Surgeon: [name], MD;  Location: [facility];  Service: Gastroenterology;  Laterality: N/A;  . ICD placement  09/07/2013   . IMPLANTABLE CARDIOVERTER DEFIBRILLATOR IMPLANT N/A 09/07/2013   Procedure: IMPLANTABLE CARDIOVERTER DEFIBRILLATOR IMPLANT;  Surgeon: [name], MD;  Location: [facility] CATH LAB;  Service: Cardiovascular;  Laterality: N/A;  . INTRA-AORTIC BALLOON PUMP INSERTION  06/20/2013   Procedure: INTRA-AORTIC BALLOON PUMP INSERTION;  Surgeon: Sinclair Grooms, MD;  Location: [facility] CATH LAB;  Service: Cardiovascular;;  . LEFT HEART CATHETERIZATION WITH CORONARY ANGIOGRAM N/A 06/20/2013   Procedure: LEFT HEART CATHETERIZATION WITH CORONARY ANGIOGRAM;  Surgeon: Sinclair Grooms, MD;  Location: [facility] CATH LAB;  Service: Cardiovascular;  Laterality: N/A;  . PERCUTANEOUS CORONARY STENT INTERVENTION (PCI-S)  06/20/2013   Procedure: PERCUTANEOUS CORONARY STENT INTERVENTION (PCI-S);  Surgeon: Sinclair Grooms, MD;  Location: [facility] CATH LAB;  Service: Cardiovascular;;        Home Medications    Prior to Admission medications   Medication Sig Start Date End Date Taking? Authorizing Provider  aspirin 81 MG chewable tablet Chew 1 tablet (81 mg total) by mouth daily. 09/09/13  Yes Theodis Blaze, MD  atorvastatin (LIPITOR) 80 MG tablet Take 1 tablet (80 mg total) by mouth daily at 6 PM. 09/09/13  Yes Theodis Blaze, MD  carvedilol (COREG) 3.125 MG tablet Take 1 tablet (3.125 mg total) by mouth 2 (two) times daily with a meal. 05/29/16  Yes Velvet Bathe, MD  clopidogrel (PLAVIX) 75 MG tablet Take 75 mg by mouth daily.   Yes [provider]  digoxin (LANOXIN) 0.125 MG tablet Take 1 tablet (0.125 mg total) by mouth daily. 09/09/13  Yes Theodis Blaze, MD  lisinopril (PRINIVIL,ZESTRIL) 2.5 MG tablet Take 1  tablet (2.5 mg total) by mouth daily. 09/09/13  Yes Theodis Blaze, MD  nitroGLYCERIN (NITROSTAT) 0.4 MG SL tablet Place 1 tablet (0.4 mg total) under the tongue every 5 (five) minutes as needed for chest pain. 05/29/16  Yes Velvet Bathe, MD  ondansetron (ZOFRAN ODT) 4 MG disintegrating tablet Take 1 tablet (4 mg total) by mouth every 8 (eight) hours as needed for nausea or vomiting. 06/02/17  Yes Kirichenko, Tatyana, PA-C  acetaminophen (TYLENOL) 325 MG tablet Take 2 tablets (650 mg total) by mouth every 4 (four) hours as needed. Patient not taking: Reported on 06/12/2017 05/27/17 05/27/18  [name], MD  dicyclomine (BENTYL) 20 MG tablet Take 1 tablet (20 mg total) by mouth 2 (two) times daily. Patient not taking: Reported on 06/12/2017 06/02/17   Jeannett Senior, PA-C  ferrous sulfate 325 (65 FE) MG tablet Take 1 tablet (325 mg total) by mouth 2 (two) times daily with a meal. Patient not taking: Reported on 06/02/2017 05/03/16   [name], MD  Multiple Vitamin (MULTIVITAMIN WITH MINERALS) TABS tablet Take 1 tablet by mouth daily. Patient not taking:  Reported on 06/12/2017 05/30/16   Velvet Bathe, MD  saccharomyces boulardii (FLORASTOR) 250 MG capsule Take 1 capsule (250 mg total) by mouth 2 (two) times daily. Patient not taking: Reported on 06/02/2017 05/29/16   Velvet Bathe, MD    Family History Family History  Problem Relation Age of Onset  . Emphysema Father   . Heart disease Mother   . Dementia Mother   . Diabetes Mother   . Pancreatic cancer Maternal Grandmother   . Lung cancer Maternal Grandmother   . Cancer Neg Hx     Social History Social History   Tobacco Use  . Smoking status: Former Smoker    Packs/day: 2.00    Years: 40.00    Pack years: 80.00    Types: Cigarettes    Last attempt to quit: 12/22/2014    Years since quitting: 2.4  . Smokeless tobacco: Never Used  Substance Use Topics  . Alcohol use: No    Comment: quit in 08/2013, he used to drink alcohol moderate  (1 pine liquor one week)  for 30 years   . Drug use: No     Allergies   Capecitabine; Celebrex [celecoxib]; and Contrast media [iodinated diagnostic agents]   Review of Systems Review of Systems  Gastrointestinal: Positive for abdominal pain, diarrhea, nausea and vomiting.  All other systems reviewed and are negative.    Physical Exam Updated Vital Signs BP 93/77   Pulse 95   Temp 97.8 F (36.6 C) (Oral)   Resp 20   SpO2 99%   Physical Exam  Constitutional: He is oriented to person, place, and time. He appears well-developed and well-nourished. No distress.  Non toxic.  HENT:  Head: Normocephalic and atraumatic.  Nose: Nose normal.  Moist mucous membranes   Eyes: Pupils are equal, round, and reactive to light. Conjunctivae and EOM are normal.  Neck: Normal range of motion.  Cardiovascular: Normal rate, regular rhythm, normal heart sounds and intact distal pulses.  No murmur heard. 2+ DP and radial pulses bilaterally. No LE edema.   Pulmonary/Chest: Effort normal and breath sounds normal.  Abdominal: Soft. Bowel sounds are normal. There is tenderness.  Generalized tenderness most at ostomy site. No G/R/R. No suprapubic or CVA tenderness. Negative Murphy's and McBurney's. Question non tender small reducible hernia left of midline surgical incision.   Musculoskeletal: Normal range of motion.  Neurological: He is alert and oriented to person, place, and time.  Skin: Skin is warm and dry. Capillary refill takes less than 2 seconds.  Midline vertical incision and ostomy take down site without erythema, edema, warmth, discharge, dehiscence.    Psychiatric: He has a normal mood and affect. His behavior is normal. Judgment and thought content normal.  Nursing note and vitals reviewed.    ED Treatments / Results  Labs (all labs ordered are listed, but only abnormal results are displayed) Labs Reviewed  COMPREHENSIVE METABOLIC PANEL - Abnormal; Notable for the following  components:      Result Value   Sodium 133 (*)    Potassium 5.3 (*)    Chloride 100 (*)    CO2 18 (*)    Glucose, Bld 125 (*)    BUN 36 (*)    Creatinine, Ser 1.69 (*)    Albumin 3.2 (*)    GFR calc non Af Amer 44 (*)    GFR calc Af Amer 51 (*)    All other components within normal limits  CBC - Abnormal; Notable for the following components:  WBC 11.3 (*)    All other components within normal limits  URINALYSIS, ROUTINE W REFLEX MICROSCOPIC - Abnormal; Notable for the following components:   APPearance HAZY (*)    Ketones, ur 20 (*)    All other components within normal limits  CULTURE, BLOOD (ROUTINE X 2)  CULTURE, BLOOD (ROUTINE X 2)  LIPASE, BLOOD  HIV ANTIBODY (ROUTINE TESTING)    EKG None  Radiology Ct Abdomen Pelvis Wo Contrast  Result Date: 06/12/2017 CLINICAL DATA:  History of adenocarcinoma of the descending colon. Status post prior left colectomy in 2018 and more recent colostomy takedown on 05/20/2017. Ongoing abdominal pain, nausea, vomiting and diarrhea since colostomy takedown. EXAM: CT ABDOMEN AND PELVIS WITHOUT CONTRAST TECHNIQUE: Multidetector CT imaging of the abdomen and pelvis was performed following the standard protocol without IV contrast. COMPARISON:  06/02/2017 and 01/14/2017. FINDINGS: Lower chest: No acute abnormality. Hepatobiliary: No focal liver abnormality is seen. No gallstones, gallbladder wall thickening, or biliary dilatation. Pancreas: Unremarkable. No pancreatic ductal dilatation or surrounding inflammatory changes. Spleen: Normal in size without focal abnormality. Adrenals/Urinary Tract: Adrenal glands are unremarkable. Stable bilateral renal cysts. No hydronephrosis. Bladder is unremarkable. Stomach/Bowel: On the prior study, there appears to have been a focal collection of extraluminal gas and fluid immediately posterior to the left colic anastomosis measuring up to 5.8 cm in maximal diameter. This area shows significant improvement with some  residual inflammation and soft tissue thickening present in a region measuring roughly 3.8 cm in diameter. A small amount of extraluminal air remains present in this region. Some adjacent inflammatory changes remain in the pericolonic fat. No evidence of free air. No bowel obstruction or significant ileus identified. Vascular/Lymphatic: Stable atherosclerosis of the abdominal aorta and iliac arteries. No evidence of abdominal aortic aneurysm. The proximal right common iliac artery shows mild focal dilatation measuring 16 mm. Distal right common iliac artery also mildly dilated, measuring 14 mm. The distal left common iliac artery measures 13 mm. No enlarged lymph nodes identified. Reproductive: Prostate is unremarkable. Other: Postsurgical changes again noted at the level of the left lower abdominal wall related to recent colostomy takedown. Less prominent postsurgical stranding in the subcutaneous fat. No evidence of abdominal wall hernia at the level of prior colostomy or elsewhere. Musculoskeletal: No acute or significant osseous findings. IMPRESSION: Improving appearance to what may have represented a focal anastomotic leak posterior to the left colon at the level of reanastomosis after colostomy takedown. An area of probable extraluminal air and fluid is smaller and contains less extraluminal air. This is likely consistent with resolving abscess. Some residual inflammation remains in this region. No associated bowel obstruction or free air. Electronically Signed   By: Aletta Edouard M.D.   On: 06/12/2017 19:21    Procedures Procedures (including critical care time)  Medications Ordered in ED Medications  Barium Sulfate 2.1 % SUSP 450 mL (has no administration in time range)  piperacillin-tazobactam (ZOSYN) IVPB 3.375 g (has no administration in time range)  multivitamin with minerals tablet 1 tablet (has no administration in time range)  saccharomyces boulardii (FLORASTOR) capsule 250 mg (has no  administration in time range)  atorvastatin (LIPITOR) tablet 80 mg (has no administration in time range)  carvedilol (COREG) tablet 3.125 mg (has no administration in time range)  digoxin (LANOXIN) tablet 0.125 mg (has no administration in time range)  lisinopril (PRINIVIL,ZESTRIL) tablet 2.5 mg (has no administration in time range)  lactated ringers infusion (has no administration in time range)  acetaminophen (TYLENOL) tablet 650  mg (has no administration in time range)    Or  acetaminophen (TYLENOL) suppository 650 mg (has no administration in time range)  diphenhydrAMINE (BENADRYL) 12.5 MG/5ML elixir 12.5 mg (has no administration in time range)    Or  diphenhydrAMINE (BENADRYL) injection 12.5 mg (has no administration in time range)  ondansetron (ZOFRAN-ODT) disintegrating tablet 4 mg (has no administration in time range)    Or  ondansetron (ZOFRAN) injection 4 mg (has no administration in time range)  simethicone (MYLICON) chewable tablet 40 mg (has no administration in time range)  lactated ringers bolus 1,000 mL (has no administration in time range)  lactated ringers bolus 1,000 mL (has no administration in time range)  piperacillin-tazobactam (ZOSYN) IVPB 3.375 g (has no administration in time range)  HYDROmorphone (DILAUDID) injection 0.5-2 mg (has no administration in time range)  oxyCODONE (Oxy IR/ROXICODONE) immediate release tablet 5-10 mg (has no administration in time range)  methocarbamol (ROBAXIN) 1,000 mg in dextrose 5 % 50 mL IVPB (has no administration in time range)  methocarbamol (ROBAXIN) tablet 1,000 mg (has no administration in time range)  gabapentin (NEURONTIN) capsule 300 mg (has no administration in time range)  prochlorperazine (COMPAZINE) injection 5-10 mg (has no administration in time range)  metoCLOPramide (REGLAN) injection 10 mg (has no administration in time range)  lip balm (CARMEX) ointment 1 application (has no administration in time range)  magic  mouthwash (has no administration in time range)  psyllium (HYDROCIL/METAMUCIL) packet 1 packet (has no administration in time range)  bismuth subsalicylate (PEPTO BISMOL) 262 MG/15ML suspension 30 mL (has no administration in time range)  feeding supplement (ENSURE SURGERY) liquid 237 mL (has no administration in time range)  guaiFENesin-dextromethorphan (ROBITUSSIN DM) 100-10 MG/5ML syrup 10 mL (has no administration in time range)  hydrocortisone (ANUSOL-HC) 2.5 % rectal cream 1 application (has no administration in time range)  alum & mag hydroxide-simeth (MAALOX/MYLANTA) 200-200-20 MG/5ML suspension 30 mL (has no administration in time range)  hydrocortisone cream 1 % 1 application (has no administration in time range)  menthol-cetylpyridinium (CEPACOL) lozenge 3 mg (has no administration in time range)  phenol (CHLORASEPTIC) mouth spray 1-2 spray (has no administration in time range)  aspirin chewable tablet 81 mg (has no administration in time range)  nitroGLYCERIN (NITROSTAT) SL tablet 0.4 mg (has no administration in time range)  sodium chloride 0.9 % bolus 1,000 mL (0 mLs Intravenous Stopped 06/12/17 1525)  ondansetron (ZOFRAN) injection 4 mg (4 mg Intravenous Given 06/12/17 1344)  morphine 4 MG/ML injection 4 mg (4 mg Intravenous Given 06/12/17 1344)  sodium chloride 0.9 % bolus 1,000 mL (0 mLs Intravenous Stopped 06/12/17 2016)  morphine 4 MG/ML injection 4 mg (4 mg Intravenous Given 06/12/17 1622)  ondansetron (ZOFRAN) injection 4 mg (4 mg Intravenous Given 06/12/17 1657)     Initial Impression / Assessment and Plan / ED Course  I have reviewed the triage vital signs and the nursing notes.  Pertinent labs & imaging results that were available during my care of the patient were reviewed by me and considered in my medical decision making (see chart for details).  Clinical Course as of Jun 12 2105  Wed Jun 12, 2017  1441 WBC(!): 11.3 [CG]  1442 Creatinine(!): 1.69 [CG]  1442 BUN(!):  36 [CG]  1442 Potassium(!): 5.3 [CG]  1442 GFR, Est Non African American(!): 44 [CG]  1442 Appearance(!): HAZY [CG]  1442 Ketones, ur(!): 20 [CG]  1448 Repeated evaluation, pt found asleep. Reports significant improvement in abdominal pain after  morphine.    [CG]  1640 CT tech called me stating pt refusing to drink contrast.  I spoke to pt and explained importance of oral contrast. He hesitantly agreed to drink half the bottle. States IV contrast will "paralyze me".    [CG]  1939 IMPRESSION: Improving appearance to what may have represented a focal anastomotic leak posterior to the left colon at the level of reanastomosis after colostomy takedown. An area of probable extraluminal air and fluid is smaller and contains less extraluminal air. This is likely consistent with resolving abscess. Some residual inflammation remains in this region. No associated bowel obstruction or free air.   Electronically Signed By: Aletta Edouard M.D. On: 06/12/2017 19:21    CT ABDOMEN PELVIS WO CONTRAST [CG]  1944 Stomach/Bowel: On the prior study, there appears to have been a focal collection of extraluminal gas and fluid immediately posterior to the left colic anastomosis measuring up to 5.8 cm in maximal diameter. This area shows significant improvement with some residual inflammation and soft tissue thickening present in a region measuring roughly 3.8 cm in diameter. A small amount of extraluminal air remains present in this region. Some adjacent inflammatory changes remain in the pericolonic fat. No evidence of free air. No bowel obstruction or significant ileus identified.    CT ABDOMEN PELVIS WO CONTRAST [CG]  1944 IMPRESSION: Improving appearance to what may have represented a focal anastomotic leak posterior to the left colon at the level of reanastomosis after colostomy takedown. An area of probable extraluminal air and fluid is smaller and contains less extraluminal air. This  is likely consistent with resolving abscess. Some residual inflammation remains in this region. No associated bowel obstruction or free air.    CT ABDOMEN PELVIS WO CONTRAST [CG]    Clinical Course User Index [CG] Kinnie Feil, PA-C    ddx includes post surgical inflammation/pain, partial SBO, gastroenteritis, or other post op complication at surgical sites. He has no constitutional symptoms such as fevers, chills. No hematemesis, hematochezia, melena. Last BM today. Does not look toxic or significantly dehydrated on exam. Abdomen without peritoneal signs. Will obtain screening labs, analgesia, antiemetics. Anticipate consult Dr. [name].   1500: Labs reviewed remarkable for creatinine 1.69/BUN 36 and 20 ketones in urine most likely from dehydration.  Abd pain responded to morphine. Low suspicion for abscess, diverticulitis, colitis, SBO in this patient. Spoke with generally surgery Claiborne Billings) recommending repeat CT scan oral contrast only given h/o allergy. Second IVF bag hanging.   1940: CT AP showing what looks like a resolving abscess. Will consult general surgery.  Final Clinical Impressions(s) / ED Diagnoses   1955: spoke to Dr Johney Maine who anticipates pt may need drain and abx, recommending [facility] where Dr [name] will be able to operate if needed. Blood cultures and zosyn ordered. Will consult triad hospitalist for admission per general surgery recommendations. PCP: [facility]   2106: Spoke to admitting hospitalist who spoke to Dr Johney Maine about patient. Dr Johney Maine to admit patient to general surgery service at [facility]. Discussed with Dr Lacinda Axon.  Final diagnoses:  None    ED Discharge Orders    None       Arlean Hopping 06/12/17 2107    Nat Christen, MD 06/16/17 [phone]

## 2017-06-12 NOTE — ED Notes (Signed)
Called report to [place],  [place]

## 2017-06-12 NOTE — H&P (Signed)
[name] [place], [place] [address] Phone: [phone] FAX: [place]  [birth year] [id number]  CARE TEAM:  PCP: System, Pcp Not In  Outpatient Care Team: Patient Care Team: System, Pcp Not In as PCP - General [name], MD as Consulting Physician (Cardiology) [name], MD as Consulting Physician (Cardiology) [name], MD as Consulting Physician (Hematology) [name], MD as Consulting Physician (General Surgery)  Inpatient Treatment Team: Treatment Team: Attending Provider: Fredia Sorrow, MD; Physician Assistant: Arlean Hopping; Consulting Physician: Nolon Nations, MD; Registered Nurse: Tally Joe, RN; Consulting Physician: [name], MD   This patient is a 57 y.o.male who presents today for surgical evaluation at the request of Carmon Sails. PA-C.   Chief complaint / Reason for evaluation: Abdominal pain with probable abscess  57 year old male who underwent emergent left-sided colectomy colostomy for obstructing tumor by Dr. [name] in April 2018.   Colon cancer pT3, pN1a, pMX.  He had post adjuvant oral Xeloda for 6 weeks before stopping due to diarrhea.  Held off on doing anything more aggressive.    He was evaluated and cleared by cardiology.  History of coronary disease and Plavix for chronic anticoagulation.  Underwent colonoscopy showing no other masses.  Underwent open lysis of adhesions and colostomy takedown 05/20/2017.  Recovered and went home a week later.  The patient tried to just take Tylenol.  However, he had crampy abdominal pain went to the emergency room a week later.  A lot of loose bowel movements.  CT scan with rectal contrast showed no anastomotic leak.  Some inflammation postop changes at the anastomosis..  Placed on antispasmodic and nausea medicines.  Followed up in clinic the next day by his surgeon at our office Dr.  [name]..  No major concerns noted.  Was feeling better.  Recommended follow-up next week.  Do not come to this appointment and came to the emergency room worsening complaints of unintentional weight loss, diarrhea, crampy pain.  Repeat CT scan does show a pocket of gas and fluid anastomosis suspicious for abscess or contained leak.  Surgical evaluation requested.  Assessment  [name]  57 y.o. male       Problem List:  Principal Problem:   Intra-abdominal abscess Active Problems:   Adenocarcinoma of descending colon  pT3, pN1a, pMX s/p colectomy/ostomy 0/10/9321   Chronic systolic CHF (congestive heart failure) (HCC)   Hyperlipidemia   Financial difficulties   AICD (automatic cardioverter/defibrillator) present   Cardiomyopathy, ischemic   Anxiety state   Essential hypertension   History of colon cancer   S/P colostomy takedown   Chronic anticoagulation  Gas and fluid pocket near left-sided descending colon anastomosis, suspicious for abscess given his irregular bowels, fatigue, abdominal pain   Plan:  Admit  IV antibiotics  IV fluids.  Pain control.  Nausea control.  Hold anticoagulation.  Consider percutaneous drainage of abscess once he is safely off his Plavix.  Most likely would do repeat CT scan in about 4 days to determine if it is better/worse.  If not improved or worse, consider drain.  Improved, consider antibiotics only.  Final option would be fecal diversion with proximal ileostomy or colostomy.  Hopefully that is not too likely since he is not in shock and is not exhausted other options.  Should eventually improve.  He may benefit from medicine consultation given his medical complexity.  Will defer  to operating surgeon.  Certainly no urgent need tonight.   VTE prophylaxis- SCDs, etc   Mobilize as tolerated to help recovery  50 minutes spent in review, evaluation, examination, counseling, and coordination of care.  More than 50% of that time was  spent in counseling.  Adin Hector, M.D., F.A.C.S. Gastrointestinal and Minimally Invasive Surgery [facility] [address], [place], [place] [address] [phone] Main / Paging   06/12/2017      Past Medical History:  Diagnosis Date  . Anemia   . Anxiety   . Automatic implantable cardioverter-defibrillator in situ    MDT Aug 2015 Dr. Lovena Le  . CAD (coronary artery disease), native coronary artery 07/03/2013   Cath 06/20/13  Normal left main, occluded LAD, occluded RCA, 50% circ EF 15%  3.0 x28 and 3.0 x 8 mm Xience stent Dr. Tamala Julian  To LAD   . Cancer ([place])   . Chronic kidney disease   . Chronic systolic CHF (congestive heart failure) (HCC)    ECHO 08/05/13  EF 30%  Anterior akinesis and inferior hypokinesis   . COPD (chronic obstructive pulmonary disease) ([place])   . Hyperlipidemia   . Mass of colon 04/2016  . Myocardial infarction ([place]) 04/29/2013  . Thrombocytopenia ([place]) 12/27/2014   Chronic      Past Surgical History:  Procedure Laterality Date  . CARDIAC CATHETERIZATION  05/2013   . CARDIAC CATHETERIZATION N/A 12/28/2014   Procedure: Left Heart Cath and Coronary Angiography;  Surgeon: [name], MD;  Location: [place] CV [facility];  Service: Cardiovascular;  Laterality: N/A;  . CARDIAC CATHETERIZATION N/A 12/28/2014   Procedure: Coronary Stent Intervention;  Surgeon: [name], MD;  Location: [facility];  Service: Cardiovascular;  Laterality: N/A;  . COLECTOMY WITH COLOSTOMY CREATION/HARTMANN PROCEDURE Left 05/18/2016   Procedure: COLECTOMY WITH OSTOMY CREATION/HARTMANN PROCEDURE;  Surgeon: [name], MD;  Location: [facility];  Service: General;  Laterality: Left;  . COLONOSCOPY WITH PROPOFOL N/A 04/01/2017   Procedure: COLONOSCOPY WITH PROPOFOL;  Surgeon: [name], MD;  Location: [facility];  Service: Gastroenterology;  Laterality: N/A;  . COLOSTOMY TAKEDOWN N/A 05/20/2017   Procedure: COLOSTOMY TAKEDOWN;   Surgeon: [name], MD;  Location: [facility];  Service: General;  Laterality: N/A;  . CORONARY ANGIOPLASTY  05/2013   . FLEXIBLE SIGMOIDOSCOPY N/A 05/16/2016   Procedure: FLEXIBLE SIGMOIDOSCOPY;  Surgeon: [name], MD;  Location: [facility];  Service: Gastroenterology;  Laterality: N/A;  . ICD placement  09/07/2013   . IMPLANTABLE CARDIOVERTER DEFIBRILLATOR IMPLANT N/A 09/07/2013   Procedure: IMPLANTABLE CARDIOVERTER DEFIBRILLATOR IMPLANT;  Surgeon: [name], MD;  Location: [facility] C[facility];  Service: Cardiovascular;  Laterality: N/A;  . INTRA-AORTIC BALLOON PUMP INSERTION  06/20/2013   Procedure: INTRA-AORTIC BALLOON PUMP INSERTION;  Surgeon: Sinclair Grooms, MD;  Location: [facility] CATH LAB;  Service: Cardiovascular;;  . LEFT HEART CATHETERIZATION WITH CORONARY ANGIOGRAM N/A 06/20/2013   Procedure: LEFT HEART CATHETERIZATION WITH CORONARY ANGIOGRAM;  Surgeon: Sinclair Grooms, MD;  Location: [facility] CATH LAB;  Service: Cardiovascular;  Laterality: N/A;  . PERCUTANEOUS CORONARY STENT INTERVENTION (PCI-S)  06/20/2013   Procedure: PERCUTANEOUS CORONARY STENT INTERVENTION (PCI-S);  Surgeon: Sinclair Grooms, MD;  Location: [facility] CATH LAB;  Service: Cardiovascular;;    Social History   Socioeconomic History  . Marital status: Single    Spouse name: Not on file  . Number of children: 0  . Years of  education: 49  . Highest education level: Not on file  Occupational History  . Occupation: Unemployed   Social Needs  . Financial resource strain: Not on file  . Food insecurity:    Worry: Not on file    Inability: Not on file  . Transportation needs:    Medical: Not on file    Non-medical: Not on file  Tobacco Use  . Smoking status: Former Smoker    Packs/day: 2.00    Years: 40.00    Pack years: 80.00    Types: Cigarettes    Last attempt to quit: 12/22/2014    Years since quitting: 2.4  . Smokeless tobacco: Never Used  Substance and Sexual Activity  . Alcohol use: No    Comment: quit  in 08/2013, he used to drink alcohol moderate (1 pine liquor one week)  for 30 years   . Drug use: No  . Sexual activity: Not Currently    Birth control/protection: Condom    Comment: men   Lifestyle  . Physical activity:    Days per week: Not on file    Minutes per session: Not on file  . Stress: Not on file  Relationships  . Social connections:    Talks on phone: Not on file    Gets together: Not on file    Attends religious service: Not on file    Active member of club or organization: Not on file    Attends meetings of clubs or organizations: Not on file    Relationship status: Not on file  . Intimate partner violence:    Fear of current or ex partner: Not on file    Emotionally abused: Not on file    Physically abused: Not on file    Forced sexual activity: Not on file  Other Topics Concern  . Not on file  Social History Narrative   Lives alone.    In a house trying to sell his home. Exercise: No.    Family History  Problem Relation Age of Onset  . Emphysema Father   . Heart disease Mother   . Dementia Mother   . Diabetes Mother   . Pancreatic cancer Maternal Grandmother   . Lung cancer Maternal Grandmother   . Cancer Neg Hx     Current Facility-Administered Medications  Medication Dose Route Frequency Provider Last Rate Last Dose  . Barium Sulfate 2.1 % SUSP 450 mL  450 mL Oral BID Fredia Sorrow, MD      . piperacillin-tazobactam (ZOSYN) IVPB 3.375 g  3.375 g Intravenous Once Kinnie Feil, PA-C       Current Outpatient Medications  Medication Sig Dispense Refill  . aspirin 81 MG chewable tablet Chew 1 tablet (81 mg total) by mouth daily. 30 tablet 5  . atorvastatin (LIPITOR) 80 MG tablet Take 1 tablet (80 mg total) by mouth daily at 6 PM. 30 tablet 5  . carvedilol (COREG) 3.125 MG tablet Take 1 tablet (3.125 mg total) by mouth 2 (two) times daily with a meal. 60 tablet 0  . clopidogrel (PLAVIX) 75 MG tablet Take 75 mg by mouth daily.    . digoxin  (LANOXIN) 0.125 MG tablet Take 1 tablet (0.125 mg total) by mouth daily. 30 tablet 5  . lisinopril (PRINIVIL,ZESTRIL) 2.5 MG tablet Take 1 tablet (2.5 mg total) by mouth daily. 30 tablet 5  . nitroGLYCERIN (NITROSTAT) 0.4 MG SL tablet Place 1 tablet (0.4 mg total) under the tongue every 5 (five) minutes as  needed for chest pain. 20 tablet 0  . ondansetron (ZOFRAN ODT) 4 MG disintegrating tablet Take 1 tablet (4 mg total) by mouth every 8 (eight) hours as needed for nausea or vomiting. 10 tablet 0  . acetaminophen (TYLENOL) 325 MG tablet Take 2 tablets (650 mg total) by mouth every 4 (four) hours as needed. (Patient not taking: Reported on 06/12/2017)    . dicyclomine (BENTYL) 20 MG tablet Take 1 tablet (20 mg total) by mouth 2 (two) times daily. (Patient not taking: Reported on 06/12/2017) 20 tablet 0  . ferrous sulfate 325 (65 FE) MG tablet Take 1 tablet (325 mg total) by mouth 2 (two) times daily with a meal. (Patient not taking: Reported on 06/02/2017) 60 tablet 3  . Multiple Vitamin (MULTIVITAMIN WITH MINERALS) TABS tablet Take 1 tablet by mouth daily. (Patient not taking: Reported on 06/12/2017) 30 tablet 0  . saccharomyces boulardii (FLORASTOR) 250 MG capsule Take 1 capsule (250 mg total) by mouth 2 (two) times daily. (Patient not taking: Reported on 06/02/2017) 30 capsule 0     Allergies  Allergen Reactions  . Capecitabine Other (See Comments)    Server dehydration and diarrhea      . Celebrex [Celecoxib] Other (See Comments)    Caused his back to burn  . Contrast Media [Iodinated Diagnostic Agents] Other (See Comments)    Patient feels weakness    ROS:   All other systems reviewed & are negative except per HPI or as noted below: Constitutional:  No fevers, chills, sweats.  Persistent fatigue.  Unintentional weight loss  Eyes:  No vision changes, No discharge HENT:  No sore throats, nasal drainage Lymph: No neck swelling, No bruising easily Pulmonary:  No cough, productive sputum CV:  No orthopnea, PND  Patient walks 10 minutes without difficulty.  No exertional chest/neck/shoulder/arm pain. GI:  No personal nor family history of inflammatory bowel disease, irritable bowel syndrome, allergy such as Celiac Sprue, dietary/dairy problems, colitis, ulcers nor gastritis.  No recent sick contacts/gastroenteritis.  No travel outside the country.  No changes in diet. Renal: No UTIs, No hematuria Genital:  No drainage, bleeding, masses Musculoskeletal: No severe joint pain.  Good ROM major joints Skin:  No sores or lesions.  No rashes Heme/Lymph:  No easy bleeding.  No swollen lymph nodes Neuro: No focal weakness/numbness.  No seizures Psych: No suicidal ideation.  No hallucinations  BP 93/77   Pulse 95   Temp 97.8 F (36.6 C) (Oral)   Resp 20   SpO2 99%   Physical Exam: General: Pt awake/alert/oriented x4 in mild major acute distress.  Foul order with poor hygiene Eyes: PERRL, normal EOM. Sclera nonicteric Neuro: CN II-XII intact w/o focal sensory/motor deficits. Lymph: No head/neck/groin lymphadenopathy Psych:  No delerium/psychosis/paranoia HENT: Normocephalic, Mucus membranes moist.  No thrush Neck: Supple, No tracheal deviation Chest: No pain.  Good respiratory excursion. CV:  Pulses intact.  Regular rhythm Abdomen: Soft, Nondistended.  Left long skin incision is closed.  No cellulitis.  No obvious hernia.  Beta abdominal discomfort diffuse but definitely more focal discomfort along the left flank.  No peritonitis. No hernias. Gen:  No inguinal hernias.  No inguinal lymphadenopathy.   Ext:  SCDs BLE.  No significant edema.  No cyanosis Skin: No petechiae / purpurea.  No major sores Musculoskeletal: No severe joint pain.  Good ROM major joints   Results:   Labs: Results for orders placed or performed during the hospital encounter of 06/12/17 (from the past 48 hour(s))  Lipase, blood     Status: None   Collection Time: 06/12/17  1:08 PM  Result Value Ref Range    Lipase 27 11 - 51 U/L    Comment: Performed at [facility], [place] [address], [place], [place] [address]  Comprehensive metabolic panel     Status: Abnormal   Collection Time: 06/12/17  1:08 PM  Result Value Ref Range   Sodium 133 (L) 135 - 145 mmol/L   Potassium 5.3 (H) 3.5 - 5.1 mmol/L   Chloride 100 (L) 101 - 111 mmol/L   CO2 18 (L) 22 - 32 mmol/L   Glucose, Bld 125 (H) 65 - 99 mg/dL   BUN 36 (H) 6 - 20 mg/dL   Creatinine, Ser 1.69 (H) 0.61 - 1.24 mg/dL   Calcium 9.2 8.9 - 10.3 mg/dL   Total Protein 6.9 6.5 - 8.1 g/dL   Albumin 3.2 (L) 3.5 - 5.0 g/dL   AST 17 15 - 41 U/L   ALT 20 17 - 63 U/L   Alkaline Phosphatase 52 38 - 126 U/L   Total Bilirubin 0.7 0.3 - 1.2 mg/dL   GFR calc non Af Amer 44 (L) >60 mL/min   GFR calc Af Amer 51 (L) >60 mL/min    Comment: (NOTE) The eGFR has been calculated using the CKD EPI equation. This calculation has not been validated in all clinical situations. eGFR's persistently <60 mL/min signify possible Chronic Kidney Disease.    Anion gap 15 5 - 15    Comment: Performed at [facility], [place] [address], [place], [place] [address]  CBC     Status: Abnormal   Collection Time: 06/12/17  1:08 PM  Result Value Ref Range   WBC 11.3 (H) 4.0 - 10.5 K/uL   RBC 4.58 4.22 - 5.81 MIL/uL   Hemoglobin 14.3 13.0 - 17.0 g/dL   HCT 41.4 39.0 - 52.0 %   MCV 90.4 78.0 - 100.0 fL   MCH 31.2 26.0 - 34.0 pg   MCHC 34.5 30.0 - 36.0 g/dL   RDW 14.1 11.5 - 15.5 %   Platelets 270 150 - 400 K/uL    Comment: Performed at [facility], [place] [address] [place], [place] [address]  Urinalysis, Routine w reflex microscopic     Status: Abnormal   Collection Time: 06/12/17  1:08 PM  Result Value Ref Range   Color, Urine YELLOW YELLOW   APPearance HAZY (A) CLEAR   Specific Gravity, Urine 1.021 1.005 - 1.030   pH 5.0 5.0 - 8.0   Glucose, UA NEGATIVE NEGATIVE mg/dL   Hgb urine dipstick NEGATIVE NEGATIVE   Bilirubin  Urine NEGATIVE NEGATIVE   Ketones, ur 20 (A) NEGATIVE mg/dL   Protein, ur NEGATIVE NEGATIVE mg/dL   Nitrite NEGATIVE NEGATIVE   Leukocytes, UA NEGATIVE NEGATIVE    Comment: Performed at [place] [address] [place], [place] [address]    Imaging / Studies: Ct Abdomen Pelvis Wo Contrast  Result Date: 06/12/2017 CLINICAL DATA:  History of adenocarcinoma of the descending colon. Status post prior left colectomy in 2018 and more recent colostomy takedown on 05/20/2017. Ongoing abdominal pain, nausea, vomiting and diarrhea since colostomy takedown. EXAM: CT ABDOMEN AND PELVIS WITHOUT CONTRAST TECHNIQUE: Multidetector CT imaging of the abdomen and pelvis was performed following the standard protocol without IV contrast. COMPARISON:  06/02/2017 and 01/14/2017. FINDINGS: Lower chest: No acute abnormality. Hepatobiliary: No focal liver abnormality is seen. No gallstones, gallbladder wall  thickening, or biliary dilatation. Pancreas: Unremarkable. No pancreatic ductal dilatation or surrounding inflammatory changes. Spleen: Normal in size without focal abnormality. Adrenals/Urinary Tract: Adrenal glands are unremarkable. Stable bilateral renal cysts. No hydronephrosis. Bladder is unremarkable. Stomach/Bowel: On the prior study, there appears to have been a focal collection of extraluminal gas and fluid immediately posterior to the left colic anastomosis measuring up to 5.8 cm in maximal diameter. This area shows significant improvement with some residual inflammation and soft tissue thickening present in a region measuring roughly 3.8 cm in diameter. A small amount of extraluminal air remains present in this region. Some adjacent inflammatory changes remain in the pericolonic fat. No evidence of free air. No bowel obstruction or significant ileus identified. Vascular/Lymphatic: Stable atherosclerosis of the abdominal aorta and iliac arteries. No evidence of abdominal aortic aneurysm. The  proximal right common iliac artery shows mild focal dilatation measuring 16 mm. Distal right common iliac artery also mildly dilated, measuring 14 mm. The distal left common iliac artery measures 13 mm. No enlarged lymph nodes identified. Reproductive: Prostate is unremarkable. Other: Postsurgical changes again noted at the level of the left lower abdominal wall related to recent colostomy takedown. Less prominent postsurgical stranding in the subcutaneous fat. No evidence of abdominal wall hernia at the level of prior colostomy or elsewhere. Musculoskeletal: No acute or significant osseous findings. IMPRESSION: Improving appearance to what may have represented a focal anastomotic leak posterior to the left colon at the level of reanastomosis after colostomy takedown. An area of probable extraluminal air and fluid is smaller and contains less extraluminal air. This is likely consistent with resolving abscess. Some residual inflammation remains in this region. No associated bowel obstruction or free air. Electronically Signed   By: Aletta Edouard M.D.   On: 06/12/2017 19:21   Ct Abdomen Pelvis Wo Contrast  Result Date: 06/02/2017 CLINICAL DATA:  57 y/o M; 13 days status post colostomy takedown. Diarrhea following colon surgery. History of colon cancer. EXAM: CT ABDOMEN AND PELVIS WITHOUT CONTRAST TECHNIQUE: Multidetector CT imaging of the abdomen and pelvis was performed following the standard protocol without IV contrast. COMPARISON:  01/14/2017 CT abdomen and pelvis. FINDINGS: Lower chest: No acute abnormality. Hepatobiliary: No focal liver abnormality is seen. No gallstones, gallbladder wall thickening, or biliary dilatation. Pancreas: Unremarkable. No pancreatic ductal dilatation or surrounding inflammatory changes. Spleen: Normal in size without focal abnormality. Adrenals/Urinary Tract: Normal adrenal glands. Bilateral renal cysts measuring up to 4.2 cm in left interpolar kidney. No hydronephrosis. Normal  bladder. Stomach/Bowel: Partial colectomy with anastomosis in the left hemiabdomen. There is mild edema surrounding the site of anastomosis and within the left pericolic gutter. No bowel wall thickening or obstructive changes identified. Vascular/Lymphatic: Aortic atherosclerosis. No enlarged abdominal or pelvic lymph nodes. Reproductive: Small prostatic calcifications. Other: Postsurgical changes are present within the anterior abdominal wall with multiple skin staples and edema. No fluid discrete collection identified. Musculoskeletal: No fracture is seen. IMPRESSION: 1. Postsurgical wounds in the anterior abdominal wall with skin staples and edema. No dehiscence or discrete fluid collection. 2. Partial colectomy with anastomosis in left hemiabdomen. Mild edema surrounding site of anastomosis, probably postsurgical changes. 3. No abscess, bowel wall thickening, or obstruction identified. Electronically Signed   By: Kristine Garbe M.D.   On: 06/02/2017 06:14   Dg Abd 2 Views  Result Date: 06/02/2017 CLINICAL DATA:  Diarrhea after colon surgery EXAM: ABDOMEN - 2 VIEW COMPARISON:  CT 01/14/2017 FINDINGS: Lung bases are clear. No free air beneath the diaphragm. Nonobstructed gas  pattern with decreased central small bowel gas and scattered colon gas. Postsurgical changes in the left hemiabdomen. Cutaneous staples over the midline and left abdomen. IMPRESSION: Nonobstructed bowel-gas pattern Electronically Signed   By: Donavan Foil M.D.   On: 06/02/2017 03:19    Medications / Allergies: per chart  Antibiotics: Anti-infectives (From admission, onward)   Start     Dose/Rate Route Frequency Ordered Stop   06/12/17 2000  piperacillin-tazobactam (ZOSYN) IVPB 3.375 g     3.375 g 100 mL/hr over 30 Minutes Intravenous  Once 06/12/17 1958          Note: Portions of this report may have been transcribed using voice recognition software. Every effort was made to ensure accuracy; however, inadvertent  computerized transcription errors may be present.   Any transcriptional errors that result from this process are unintentional.    Adin Hector, M.D., F.A.C.S. Gastrointestinal and Minimally Invasive Surgery [facility] [address], [place], [place] [address] [phone] Main / Paging   06/12/2017

## 2017-06-12 NOTE — ED Notes (Signed)
Surgery MD/consult in room with patient.

## 2017-06-12 NOTE — ED Triage Notes (Signed)
Patient presented to ed with c/of abdominal pain from surgical site three week ago they remove colostomy. Pt c/o 10/10 pain at this time. Pt c/o nausea and vomiting.

## 2017-06-13 MED ORDER — ENOXAPARIN SODIUM 40 MG/0.4ML ~~LOC~~ SOLN
40.0000 mg | SUBCUTANEOUS | Status: DC
  Administered 2017-06-13 – 2017-06-16 (×4): 40 mg via SUBCUTANEOUS
  Filled 2017-06-13 (×5): qty 0.4

## 2017-06-13 MED ORDER — PROMETHAZINE HCL 25 MG/ML IJ SOLN
12.5000 mg | INTRAMUSCULAR | Status: DC | PRN
  Administered 2017-06-13: 12.5 mg via INTRAVENOUS

## 2017-06-13 NOTE — Progress Notes (Addendum)
Subjective/Chief Complaint: No abdominal pain but very nauseated   Objective: Vital signs in last 24 hours: Temp:  [97.8 F (36.6 C)-97.9 F (36.6 C)] 97.8 F (36.6 C) (05/16 0521) Pulse Rate:  [85-97] 85 (05/16 0521) Resp:  [19-26] 20 (05/16 0521) BP: (91-112)/(70-79) 97/72 (05/16 0521) SpO2:  [97 %-100 %] 99 % (05/16 0521) Weight:  [71.9 kg (158 lb 8.2 oz)] 71.9 kg (158 lb 8.2 oz) (05/15 2326) Last BM Date: 06/12/17  Intake/Output from previous day: 05/15 0701 - 05/16 0700 In: 2600 [I.V.:500; IV Piggyback:2100] Out: -  Intake/Output this shift: No intake/output data recorded.  General appearance: cooperative Resp: clear to auscultation bilaterally Cardio: regular rate and rhythm GI: soft, nontender, incisions CDI Extremities: calves soft  Lab Results:  Recent Labs    06/12/17 1308  WBC 11.3*  HGB 14.3  HCT 41.4  PLT 270   BMET Recent Labs    06/12/17 1308  NA 133*  K 5.3*  CL 100*  CO2 18*  GLUCOSE 125*  BUN 36*  CREATININE 1.69*  CALCIUM 9.2   PT/INR No results for input(s): LABPROT, INR in the last 72 hours. ABG No results for input(s): PHART, HCO3 in the last 72 hours.  Invalid input(s): PCO2, PO2  Studies/Results: Ct Abdomen Pelvis Wo Contrast  Result Date: 06/12/2017 CLINICAL DATA:  History of adenocarcinoma of the descending colon. Status post prior left colectomy in 2018 and more recent colostomy takedown on 05/20/2017. Ongoing abdominal pain, nausea, vomiting and diarrhea since colostomy takedown. EXAM: CT ABDOMEN AND PELVIS WITHOUT CONTRAST TECHNIQUE: Multidetector CT imaging of the abdomen and pelvis was performed following the standard protocol without IV contrast. COMPARISON:  06/02/2017 and 01/14/2017. FINDINGS: Lower chest: No acute abnormality. Hepatobiliary: No focal liver abnormality is seen. No gallstones, gallbladder wall thickening, or biliary dilatation. Pancreas: Unremarkable. No pancreatic ductal dilatation or surrounding  inflammatory changes. Spleen: Normal in size without focal abnormality. Adrenals/Urinary Tract: Adrenal glands are unremarkable. Stable bilateral renal cysts. No hydronephrosis. Bladder is unremarkable. Stomach/Bowel: On the prior study, there appears to have been a focal collection of extraluminal gas and fluid immediately posterior to the left colic anastomosis measuring up to 5.8 cm in maximal diameter. This area shows significant improvement with some residual inflammation and soft tissue thickening present in a region measuring roughly 3.8 cm in diameter. A small amount of extraluminal air remains present in this region. Some adjacent inflammatory changes remain in the pericolonic fat. No evidence of free air. No bowel obstruction or significant ileus identified. Vascular/Lymphatic: Stable atherosclerosis of the abdominal aorta and iliac arteries. No evidence of abdominal aortic aneurysm. The proximal right common iliac artery shows mild focal dilatation measuring 16 mm. Distal right common iliac artery also mildly dilated, measuring 14 mm. The distal left common iliac artery measures 13 mm. No enlarged lymph nodes identified. Reproductive: Prostate is unremarkable. Other: Postsurgical changes again noted at the level of the left lower abdominal wall related to recent colostomy takedown. Less prominent postsurgical stranding in the subcutaneous fat. No evidence of abdominal wall hernia at the level of prior colostomy or elsewhere. Musculoskeletal: No acute or significant osseous findings. IMPRESSION: Improving appearance to what may have represented a focal anastomotic leak posterior to the left colon at the level of reanastomosis after colostomy takedown. An area of probable extraluminal air and fluid is smaller and contains less extraluminal air. This is likely consistent with resolving abscess. Some residual inflammation remains in this region. No associated bowel obstruction or free air. Electronically  Signed   By: Aletta Edouard M.D.   On: 06/12/2017 19:21    Anti-infectives: Anti-infectives (From admission, onward)   Start     Dose/Rate Route Frequency Ordered Stop   06/13/17 0500  piperacillin-tazobactam (ZOSYN) IVPB 3.375 g     3.375 g 12.5 mL/hr over 240 Minutes Intravenous Every 8 hours 06/12/17 2059     06/12/17 2000  piperacillin-tazobactam (ZOSYN) IVPB 3.375 g     3.375 g 100 mL/hr over 30 Minutes Intravenous  Once 06/12/17 1958 06/12/17 2141      Assessment/Plan: S/P colostomy takedown 05/20/17 Intra-abdominal abscess at anastamosis - likely contained leak, Zosyn, bowel rest. Consider re-imaging in a few days. If worsens may need drain. I think it should resolve with IV ABX. AKI - mild pre-renal due to dehydration. IVF, BMET in AM CAD/ischemic cardiomyopathy - hold Plavix in case needs drain CHF/AICD HX Colon cancer pT3, pN1a, pMX VTE - Lovenox FEN - sips/chips, add phenergan Dispo - above    LOS: 1 day    Zenovia Jarred 06/13/2017

## 2017-06-14 LAB — BASIC METABOLIC PANEL
Anion gap: 12 (ref 5–15)
BUN: 18 mg/dL (ref 6–20)
CHLORIDE: 104 mmol/L (ref 101–111)
CO2: 21 mmol/L — ABNORMAL LOW (ref 22–32)
CREATININE: 1.74 mg/dL — AB (ref 0.61–1.24)
Calcium: 8.4 mg/dL — ABNORMAL LOW (ref 8.9–10.3)
GFR calc Af Amer: 50 mL/min — ABNORMAL LOW (ref >60)
GFR calc non Af Amer: 43 mL/min — ABNORMAL LOW (ref >60)
Glucose, Bld: 68 mg/dL (ref 65–99)
Potassium: 4.3 mmol/L (ref 3.5–5.1)
SODIUM: 137 mmol/L (ref 135–145)

## 2017-06-14 LAB — CBC
HCT: 32.9 % — ABNORMAL LOW (ref 39.0–52.0)
HEMOGLOBIN: 10.6 g/dL — AB (ref 13.0–17.0)
MCH: 30.2 pg (ref 26.0–34.0)
MCHC: 32.2 g/dL (ref 30.0–36.0)
MCV: 93.7 fL (ref 78.0–100.0)
Platelets: 162 10*3/uL (ref 150–400)
RBC: 3.51 MIL/uL — ABNORMAL LOW (ref 4.22–5.81)
RDW: 14.3 % (ref 11.5–15.5)
WBC: 7.8 10*3/uL (ref 4.0–10.5)

## 2017-06-14 LAB — HIV ANTIBODY (ROUTINE TESTING W REFLEX): HIV Screen 4th Generation wRfx: NONREACTIVE

## 2017-06-14 MED ORDER — BOOST / RESOURCE BREEZE PO LIQD CUSTOM
1.0000 | Freq: Three times a day (TID) | ORAL | Status: DC
  Administered 2017-06-14 – 2017-06-15 (×2): 1 via ORAL

## 2017-06-14 NOTE — Progress Notes (Signed)
Subjective/Chief Complaint: Had a normal BM then some liquid BM, still intermittent nausea   Objective: Vital signs in last 24 hours: Temp:  [97.9 F (36.6 C)-98.7 F (37.1 C)] 98.7 F (37.1 C) (05/17 0545) Pulse Rate:  [82-88] 88 (05/17 0545) Resp:  [16] 16 (05/17 0545) BP: (94-96)/(64-68) 94/64 (05/17 0545) SpO2:  [95 %-98 %] 95 % (05/17 0545) Last BM Date: 06/13/17  Intake/Output from previous day: 05/16 0701 - 05/17 0700 In: 2870 [P.O.:270; I.V.:2500; IV Piggyback:100] Out: 950 [Urine:950] Intake/Output this shift: No intake/output data recorded.  General appearance: cooperative Resp: clear to auscultation bilaterally Cardio: regular rate and rhythm GI: soft, nontender, incisions healed  Lab Results:  Recent Labs    06/12/17 1308 06/14/17 0523  WBC 11.3* 7.8  HGB 14.3 10.6*  HCT 41.4 32.9*  PLT 270 162   BMET Recent Labs    06/12/17 1308 06/14/17 0523  NA 133* 137  K 5.3* 4.3  CL 100* 104  CO2 18* 21*  GLUCOSE 125* 68  BUN 36* 18  CREATININE 1.69* 1.74*  CALCIUM 9.2 8.4*   PT/INR No results for input(s): LABPROT, INR in the last 72 hours. ABG No results for input(s): PHART, HCO3 in the last 72 hours.  Invalid input(s): PCO2, PO2  Studies/Results: Ct Abdomen Pelvis Wo Contrast  Result Date: 06/12/2017 CLINICAL DATA:  History of adenocarcinoma of the descending colon. Status post prior left colectomy in 2018 and more recent colostomy takedown on 05/20/2017. Ongoing abdominal pain, nausea, vomiting and diarrhea since colostomy takedown. EXAM: CT ABDOMEN AND PELVIS WITHOUT CONTRAST TECHNIQUE: Multidetector CT imaging of the abdomen and pelvis was performed following the standard protocol without IV contrast. COMPARISON:  06/02/2017 and 01/14/2017. FINDINGS: Lower chest: No acute abnormality. Hepatobiliary: No focal liver abnormality is seen. No gallstones, gallbladder wall thickening, or biliary dilatation. Pancreas: Unremarkable. No pancreatic  ductal dilatation or surrounding inflammatory changes. Spleen: Normal in size without focal abnormality. Adrenals/Urinary Tract: Adrenal glands are unremarkable. Stable bilateral renal cysts. No hydronephrosis. Bladder is unremarkable. Stomach/Bowel: On the prior study, there appears to have been a focal collection of extraluminal gas and fluid immediately posterior to the left colic anastomosis measuring up to 5.8 cm in maximal diameter. This area shows significant improvement with some residual inflammation and soft tissue thickening present in a region measuring roughly 3.8 cm in diameter. A small amount of extraluminal air remains present in this region. Some adjacent inflammatory changes remain in the pericolonic fat. No evidence of free air. No bowel obstruction or significant ileus identified. Vascular/Lymphatic: Stable atherosclerosis of the abdominal aorta and iliac arteries. No evidence of abdominal aortic aneurysm. The proximal right common iliac artery shows mild focal dilatation measuring 16 mm. Distal right common iliac artery also mildly dilated, measuring 14 mm. The distal left common iliac artery measures 13 mm. No enlarged lymph nodes identified. Reproductive: Prostate is unremarkable. Other: Postsurgical changes again noted at the level of the left lower abdominal wall related to recent colostomy takedown. Less prominent postsurgical stranding in the subcutaneous fat. No evidence of abdominal wall hernia at the level of prior colostomy or elsewhere. Musculoskeletal: No acute or significant osseous findings. IMPRESSION: Improving appearance to what may have represented a focal anastomotic leak posterior to the left colon at the level of reanastomosis after colostomy takedown. An area of probable extraluminal air and fluid is smaller and contains less extraluminal air. This is likely consistent with resolving abscess. Some residual inflammation remains in this region. No associated bowel obstruction  or free air. Electronically Signed   By: Aletta Edouard M.D.   On: 06/12/2017 19:21    Anti-infectives: Anti-infectives (From admission, onward)   Start     Dose/Rate Route Frequency Ordered Stop   06/13/17 0500  piperacillin-tazobactam (ZOSYN) IVPB 3.375 g     3.375 g 12.5 mL/hr over 240 Minutes Intravenous Every 8 hours 06/12/17 2059     06/12/17 2000  piperacillin-tazobactam (ZOSYN) IVPB 3.375 g     3.375 g 100 mL/hr over 30 Minutes Intravenous  Once 06/12/17 1958 06/12/17 2141      Assessment/Plan: S/P colostomy takedown 05/20/17 Intra-abdominal abscess at anastamosis - likely contained leak, Zosyn, clears. Improving clinically. Consider re-imaging in a few days. If worsens may need drain. I think it should resolve with IV ABX. AKI - mild pre-renal due to dehydration. IVF, BMET in AM CAD/ischemic cardiomyopathy - hold Plavix in case needs drain CHF/AICD HX Colon cancer pT3, pN1a, pMX VTE - Lovenox FEN - clears plus resource Dispo - above  LOS: 2 days    Zenovia Jarred 06/14/2017

## 2017-06-15 LAB — BASIC METABOLIC PANEL
ANION GAP: 8 (ref 5–15)
BUN: 9 mg/dL (ref 6–20)
CO2: 27 mmol/L (ref 22–32)
Calcium: 8.2 mg/dL — ABNORMAL LOW (ref 8.9–10.3)
Chloride: 104 mmol/L (ref 101–111)
Creatinine, Ser: 1.38 mg/dL — ABNORMAL HIGH (ref 0.61–1.24)
GFR calc Af Amer: >60 mL/min (ref >60)
GFR, EST NON AFRICAN AMERICAN: 57 mL/min — AB (ref >60)
GLUCOSE: 83 mg/dL (ref 65–99)
Potassium: 4 mmol/L (ref 3.5–5.1)
Sodium: 139 mmol/L (ref 135–145)

## 2017-06-15 LAB — CBC
HCT: 28.2 % — ABNORMAL LOW (ref 39.0–52.0)
Hemoglobin: 9.2 g/dL — ABNORMAL LOW (ref 13.0–17.0)
MCH: 30 pg (ref 26.0–34.0)
MCHC: 32.6 g/dL (ref 30.0–36.0)
MCV: 91.9 fL (ref 78.0–100.0)
Platelets: 145 10*3/uL — ABNORMAL LOW (ref 150–400)
RBC: 3.07 MIL/uL — ABNORMAL LOW (ref 4.22–5.81)
RDW: 14 % (ref 11.5–15.5)
WBC: 6.1 10*3/uL (ref 4.0–10.5)

## 2017-06-15 NOTE — Progress Notes (Signed)
   Subjective/Chief Complaint: Tolerated clears, nausea better, 2 liquid BM, wants food   Objective: Vital signs in last 24 hours: Temp:  [97.7 F (36.5 C)-97.9 F (36.6 C)] 97.7 F (36.5 C) (05/18 0515) Pulse Rate:  [76-82] 82 (05/18 0515) Resp:  [16-18] 18 (05/18 0515) BP: (93-105)/(64-72) 105/72 (05/18 0515) SpO2:  [97 %-99 %] 97 % (05/18 0515) Last BM Date: 06/14/17  Intake/Output from previous day: 05/17 0701 - 05/18 0700 In: 2633.7 [P.O.:222; I.V.:2311.7; IV Piggyback:100] Out: 100 [Urine:100] Intake/Output this shift: Total I/O In: 1283.7 [P.O.:222; I.V.:1011.7; IV Piggyback:50] Out: -   General appearance: cooperative Resp: clear to auscultation bilaterally GI: soft, NT, +BS  Lab Results:  Recent Labs    06/12/17 1308 06/14/17 0523  WBC 11.3* 7.8  HGB 14.3 10.6*  HCT 41.4 32.9*  PLT 270 162   BMET Recent Labs    06/12/17 1308 06/14/17 0523  NA 133* 137  K 5.3* 4.3  CL 100* 104  CO2 18* 21*  GLUCOSE 125* 68  BUN 36* 18  CREATININE 1.69* 1.74*  CALCIUM 9.2 8.4*   PT/INR No results for input(s): LABPROT, INR in the last 72 hours. ABG No results for input(s): PHART, HCO3 in the last 72 hours.  Invalid input(s): PCO2, PO2  Studies/Results: No results found.  Anti-infectives: Anti-infectives (From admission, onward)   Start     Dose/Rate Route Frequency Ordered Stop   06/13/17 0500  piperacillin-tazobactam (ZOSYN) IVPB 3.375 g     3.375 g 12.5 mL/hr over 240 Minutes Intravenous Every 8 hours 06/12/17 2059     06/12/17 2000  piperacillin-tazobactam (ZOSYN) IVPB 3.375 g     3.375 g 100 mL/hr over 30 Minutes Intravenous  Once 06/12/17 1958 06/12/17 2141      Assessment/Plan: S/P colostomy takedown 05/20/17 Intra-abdominal abscess at anastamosis - likely contained leak, Zosyn, Advance diet. Improving clinically. Consider re-imaging in a few days. If worsens may need drain. I think it should resolve with IV ABX. AKI - mild pre-renal due to  dehydration. IVF, labs P CAD/ischemic cardiomyopathy - hold Plavix in case needs drain CHF/AICD HX Colon cancer pT3, pN1a, pMX VTE - Lovenox FEN - soft diet Dispo - above  LOS: 3 days    Zenovia Jarred 06/15/2017

## 2017-06-16 LAB — BASIC METABOLIC PANEL
ANION GAP: 8 (ref 5–15)
BUN: 7 mg/dL (ref 6–20)
CALCIUM: 8 mg/dL — AB (ref 8.9–10.3)
CO2: 26 mmol/L (ref 22–32)
Chloride: 104 mmol/L (ref 101–111)
Creatinine, Ser: 1.36 mg/dL — ABNORMAL HIGH (ref 0.61–1.24)
GFR calc non Af Amer: 58 mL/min — ABNORMAL LOW (ref >60)
GLUCOSE: 93 mg/dL (ref 65–99)
Potassium: 3.8 mmol/L (ref 3.5–5.1)
SODIUM: 138 mmol/L (ref 135–145)

## 2017-06-16 NOTE — Progress Notes (Signed)
   Subjective/Chief Complaint: Less nausea, ate more, no pain now   Objective: Vital signs in last 24 hours: Temp:  [97.7 F (36.5 C)-98.4 F (36.9 C)] 98.1 F (36.7 C) (05/19 0621) Pulse Rate:  [75-89] 77 (05/19 0621) BP: (94-105)/(60-75) 105/75 (05/19 0621) SpO2:  [96 %-99 %] 96 % (05/19 0621) Last BM Date: 06/15/17  Intake/Output from previous day: 05/18 0701 - 05/19 0700 In: 470 [P.O.:120; I.V.:100; IV Piggyback:250] Out: 200 [Urine:200] Intake/Output this shift: No intake/output data recorded.  General appearance: alert and cooperative Resp: clear to auscultation bilaterally Cardio: regular rate and rhythm GI: soft, nontender  Lab Results:  Recent Labs    06/14/17 0523 06/15/17 0656  WBC 7.8 6.1  HGB 10.6* 9.2*  HCT 32.9* 28.2*  PLT 162 145*   BMET Recent Labs    06/15/17 0656 06/16/17 0524  NA 139 138  K 4.0 3.8  CL 104 104  CO2 27 26  GLUCOSE 83 93  BUN 9 7  CREATININE 1.38* 1.36*  CALCIUM 8.2* 8.0*   PT/INR No results for input(s): LABPROT, INR in the last 72 hours. ABG No results for input(s): PHART, HCO3 in the last 72 hours.  Invalid input(s): PCO2, PO2  Studies/Results: No results found.  Anti-infectives: Anti-infectives (From admission, onward)   Start     Dose/Rate Route Frequency Ordered Stop   06/13/17 0500  piperacillin-tazobactam (ZOSYN) IVPB 3.375 g     3.375 g 12.5 mL/hr over 240 Minutes Intravenous Every 8 hours 06/12/17 2059     06/12/17 2000  piperacillin-tazobactam (ZOSYN) IVPB 3.375 g     3.375 g 100 mL/hr over 30 Minutes Intravenous  Once 06/12/17 1958 06/12/17 2141      Assessment/Plan: S/P colostomy takedown 05/20/17 Intra-abdominal abscess at anastamosis - likely contained leak, Zosyn, Advance diet. Improving clinically. Likely can avoid another CT. CRI - back to baseline CAD/ischemic cardiomyopathy - likely resume Plavix tomorrow CHF/AICD HX Colon cancer pT3, pN1a, pMX VTE - Lovenox FEN - soft diet,  decrease IVF Dispo - home tomorrow if continues to improve  LOS: 4 days    Christopher [name] 06/16/2017

## 2017-06-17 ENCOUNTER — Encounter (HOSPITAL_COMMUNITY): Payer: Self-pay | Admitting: *Deleted

## 2017-06-17 LAB — CULTURE, BLOOD (ROUTINE X 2)
Culture: NO GROWTH
Culture: NO GROWTH
SPECIAL REQUESTS: ADEQUATE

## 2017-06-17 MED ORDER — CIPROFLOXACIN HCL 500 MG PO TABS: 500.0000 mg | ORAL_TABLET | Freq: Two times a day (BID) | ORAL | 0 refills | Status: AC

## 2017-06-17 MED ORDER — ALUM & MAG HYDROXIDE-SIMETH 200-200-20 MG/5ML PO SUSP: 30.0000 mL | Freq: Four times a day (QID) | ORAL | 0 refills | Status: DC | PRN

## 2017-06-17 MED ORDER — OXYCODONE HCL 5 MG PO TABS: 5.0000 mg | ORAL_TABLET | Freq: Three times a day (TID) | ORAL | 0 refills | Status: DC | PRN

## 2017-06-17 NOTE — Discharge Summary (Signed)
Physician Discharge Summary  Patient ID: Christopher [name] MRN: [id number] DOB/AGE: 05/28/1960 57 y.o.  Admit date: 06/12/2017 Discharge date: 06/17/2017  Admission Diagnoses:small intra-abdominal abscess  Discharge Diagnoses:  Principal Problem:   Intra-abdominal abscess Active Problems:   Chronic systolic CHF (congestive heart failure) (HCC)   Hyperlipidemia   Financial difficulties   AICD (automatic cardioverter/defibrillator) present   Cardiomyopathy, ischemic   Anxiety state   Essential hypertension   Adenocarcinoma of descending colon  pT3, pN1a, pMX s/p colectomy/ostomy 05/18/2016   History of colon cancer   S/P colostomy takedown   Chronic anticoagulation   Discharged Condition: good  Hospital Course: Admitted with small abscess at anastamosis S/P colostomy takedown. He was treated with IV Zosyn and improved rather quickly. He tolerated advancement of his diet and had minimal pain. Plan D/C home on 5d Cipro and F/U next week.  Consults: None  Significant Diagnostic Studies: CT  Treatments: IV hydration and antibiotics: Zosyn  Discharge Exam: Blood pressure 95/69, pulse 77, temperature 98.3 F (36.8 C), temperature source Oral, resp. rate 18, height 5' 7.5" (1.715 m), weight 71.9 kg (158 lb 8.2 oz), SpO2 95 %. General appearance: alert and cooperative Resp: clear to auscultation bilaterally Cardio: regular rate and rhythm GI: soft, NT, incisions healed\  Disposition: Discharge disposition: 01-Home or Self Care       Discharge Instructions    Call MD for:  persistant nausea and vomiting   Complete by:  As directed    Diet - low sodium heart healthy   Complete by:  As directed    Discharge instructions   Complete by:  As directed    My office will call and arrange follow-up next week   Increase activity slowly   Complete by:  As directed      Allergies as of 06/17/2017      Reactions   Capecitabine Other (See Comments)   Server dehydration and diarrhea    Celebrex [celecoxib] Other (See Comments)   Caused his back to burn   Contrast Media [iodinated Diagnostic Agents] Other (See Comments)   Patient feels weakness      Medication List    STOP taking these medications   acetaminophen 325 MG tablet Commonly known as:  TYLENOL   dicyclomine 20 MG tablet Commonly known as:  BENTYL   ferrous sulfate 325 (65 FE) MG tablet     TAKE these medications   alum & mag hydroxide-simeth 200-200-20 MG/5ML suspension Commonly known as:  MAALOX/MYLANTA Take 30 mLs by mouth every 6 (six) hours as needed for indigestion, heartburn or flatulence.   aspirin 81 MG chewable tablet Chew 1 tablet (81 mg total) by mouth daily.   atorvastatin 80 MG tablet Commonly known as:  LIPITOR Take 1 tablet (80 mg total) by mouth daily at 6 PM.   carvedilol 3.125 MG tablet Commonly known as:  COREG Take 1 tablet (3.125 mg total) by mouth 2 (two) times daily with a meal.   ciprofloxacin 500 MG tablet Commonly known as:  CIPRO Take 1 tablet (500 mg total) by mouth 2 (two) times daily for 7 days.   clopidogrel 75 MG tablet Commonly known as:  PLAVIX Take 75 mg by mouth daily.   digoxin 0.125 MG tablet Commonly known as:  LANOXIN Take 1 tablet (0.125 mg total) by mouth daily.   lisinopril 2.5 MG tablet Commonly known as:  PRINIVIL,ZESTRIL Take 1 tablet (2.5 mg total) by mouth daily.   multivitamin with minerals Tabs tablet Take 1 tablet  by mouth daily.   nitroGLYCERIN 0.4 MG SL tablet Commonly known as:  NITROSTAT Place 1 tablet (0.4 mg total) under the tongue every 5 (five) minutes as needed for chest pain.   ondansetron 4 MG disintegrating tablet Commonly known as:  ZOFRAN ODT Take 1 tablet (4 mg total) by mouth every 8 (eight) hours as needed for nausea or vomiting.   oxyCODONE 5 MG immediate release tablet Commonly known as:  Oxy IR/ROXICODONE Take 1 tablet (5 mg total) by mouth every 8 (eight) hours as needed for severe pain.    saccharomyces boulardii 250 MG capsule Commonly known as:  FLORASTOR Take 1 capsule (250 mg total) by mouth 2 (two) times daily.      Follow-up Information    [name], MD Follow up in 1 week(s).   Specialty:  General Surgery Why:  my office will call you Contact information: [place] [address] [phone]           Signed: Zenovia Jarred 06/17/2017, 7:59 AM

## 2017-06-17 NOTE — Discharge Planning (Signed)
Patient discharged home in stable condition. Verbalizes understanding of all discharge instructions, including home medications and follow up appointments. 

## 2017-06-25 DIAGNOSIS — I5022 Chronic systolic (congestive) heart failure: Secondary | ICD-10-CM | POA: Diagnosis not present

## 2017-06-25 DIAGNOSIS — Z955 Presence of coronary angioplasty implant and graft: Secondary | ICD-10-CM | POA: Diagnosis not present

## 2017-06-25 DIAGNOSIS — C186 Malignant neoplasm of descending colon: Secondary | ICD-10-CM | POA: Diagnosis not present

## 2017-06-25 DIAGNOSIS — I252 Old myocardial infarction: Secondary | ICD-10-CM | POA: Diagnosis not present

## 2017-06-25 DIAGNOSIS — E7849 Other hyperlipidemia: Secondary | ICD-10-CM | POA: Diagnosis not present

## 2017-06-25 DIAGNOSIS — M5136 Other intervertebral disc degeneration, lumbar region: Secondary | ICD-10-CM | POA: Diagnosis not present

## 2017-06-25 DIAGNOSIS — Z95810 Presence of automatic (implantable) cardiac defibrillator: Secondary | ICD-10-CM | POA: Diagnosis not present

## 2017-06-25 DIAGNOSIS — I2510 Atherosclerotic heart disease of native coronary artery without angina pectoris: Secondary | ICD-10-CM | POA: Diagnosis not present

## 2017-07-12 NOTE — Progress Notes (Signed)
[place]  [phone] [phone]  Clinic Follow-up Note   Patient Care Team: System, Pcp Not In as PCP - General Christopher Lance, MD as Consulting Physician (Cardiology) Christopher Reedy, MD as Consulting Physician (Cardiology) Christopher Merle, MD as Consulting Physician (Hematology) Christopher Skeans, MD as Consulting Physician (General Surgery)   Date of Service:  07/17/2017  CHIEF COMPLAINTS:  Follow up left side colon cancer, stage IIIB  Oncology History   Cancer Staging Adenocarcinoma of descending colon  pT3, pN1a, pMX s/p colectomy/ostomy 05/18/2016 Staging form: Colon and Rectum, AJCC 8th Edition - Pathologic stage from 05/18/2016: Stage IIIB (pT3, pN1a, cM0) - Signed by Christopher Merle, MD on 07/01/2016       Adenocarcinoma of descending colon  pT3, pN1a, pMX s/p colectomy/ostomy 05/18/2016   05/09/2016 Imaging    CT chest, abdomen and pelvis showed probable descending colon carcinoma. No evidence of distant metastasis. 7 cm hypervascular focus in the upper liver, not typical for metastatic disease.      05/16/2016 Procedure    Colonoscopy showed a large mass which completely obstructss the distal descending colon. Biopsied. Diverticulosis in the left colon.      05/18/2016 Surgery    Left hemicolectomy by Dr. Grandville [name]      05/18/2016 Pathology Results    Left colon segmental resection shows invasive colorectal adenocarcinoma, 4.6 cm, tumor extends into subserosa connective tissue, margins are negative, metastatic carcinoma in 1 of 12 lymph nodes. Grade 2, lymphovascular invasion present, perineural invasion negative.      05/18/2016 Miscellaneous    MSI-stable       05/18/2016 Initial Diagnosis    Adenocarcinoma of descending colon  pT3, pN1a, pMX s/p colectomy/ostomy 05/18/2016      07/16/2016 - 08/22/2016 Chemotherapy    Xeloda twice a day for 2 weeks on and 1 week off.  stopped after 3 days due to severe diarrhea, and changed to 1534m  bid, started on 07/31/2016; Hold Xeloda starting 08/22/16 due to AKI. Will lower Xeloda to 3 in the morning and 2 in the evening when restart next cycle.   Stopped due to poor tolerance of sever diarrhea.        01/14/2017 Imaging    CT CAP W Contrast 01/14/17  IMPRESSION: 1. Stable exam. No new or progressive findings in the chest, abdomen, or pelvis. The tiny hypervascular focus in the dome of the liver seen on the previous study is not evident on today's exam. No evidence for metastatic disease on today's CT scan. 2.  Aortic Atherosclerois (ICD10-170.0) 3. Distal transverse end colostomy. 4.  Emphysema. (ICD10-J43.9)      04/01/2017 Procedure    04/01/2017 - One 6 mm polyp in the cecum, removed with a cold snare. Resected and retrieved. - Diverticulosis in the left colon.      04/01/2017 Pathology Results    Colon, polyp(s), cecal cold snare - TUBULAR ADENOMA (ONE FRAGMENT). - NO HIGH GRADE DYSPLASIA OR MALIGNANCY.      05/20/2017 Surgery    Surgery:05/20/2017 COLOSTOMY TAKEDOWN BGeorganna SkeansMD       05/20/2017 Pathology Results    Pathology: 05/20/2017 Stoma - FINDINGS CONSISTENT WITH OSTOMY. - NO DYSPLASIA OR MALIGNANCY.        HISTORY OF PRESENTING ILLNESS:  07/02/16 HLaurena Spies89y.o. male is here because of recent diagnosis of Invasive Colorectal Adenocarcinoma of the Colon. Initially he was bleeding on and off and had a lot of gas and constipation. He had a  [place] in December and that did not cover his bowel. He also felt a line appearing on his stomach. These symptoms in October. He also had tingling. He went to ED 05/09/16 for syncope. There he had a CT AP scan that showed evidence of a mass in his colon. Followed by a live Colonoscopy and biopsy on 4/18 done by Dr. Mallie [name] showing evidence the mass was cancer. He had a colectomy surgery 05/18/16 and the results showed to be Invasive colorectal Adenocarcinoma of the Colon. During his colonoscopy process he could not  complete pre treatment  Because the cancer cut it off the path to his bowel and caused him to throw up with blood, so he did a live colonoscopy. PCP is Dr. Adrian [name]. He had gotten staph epidermitis in his incision while in the hospital his incision is half healed now.   He has a history of a Psychologist, forensic. He had 2 heart attacks and 2 stents placed and CHF.   He presents to the clinic today post colectomy. He lives by himself and he feels Ok now but he feels like he is not prepared for chemo and the sickness that comes with it. He was told to change his dressing every 3 days and he now changes it everyday. He had painful swallowing after surgery which has resolved and some ulceration in his mouth seen by his dentist. He is taking ferrous sulfate. He has a lot of stress because he is not sure what was really going on. He is taking baby aspirin. His appetite and energy level is back to normal. He lost weight after surgery. He lost 20 pounds since surgery. He has no feet swelling and he pain on his chest around his pace maker which was put in August 2015. He gets regularly checked for HIV. He wants to know why he cannot use target therapy. He does not want to do IV Chemotherapy. His regular cardiologist keeps him on his regular heart medication and after the surgery he got nitro. He is going to see his cardiologist on the 19th.    CURRENT THERAPY:  Surveillance   INTERVAL HISTORY:  Christopher [name] is here for a follow-up. He presents to the clinic today alone.  He underwent colostomy taken down on May 20, 2017, unfortunately postop he developed abdominal abscess which was treated with antibiotics. He complained of constipation after surgery, but it is manageable with stool softener, and has lost some weight.   On review of systems, he reports constipation after surgery . Pertinent positives are listed and detailed within the above HPI.    MEDICAL HISTORY:  Past Medical History:  Diagnosis Date  .  Anemia   . Anxiety   . Automatic implantable cardioverter-defibrillator in situ    MDT Aug 2015 Christopher [name]  . CAD (coronary artery disease), native coronary artery 07/03/2013   Cath 06/20/13  Normal left main, occluded LAD, occluded RCA, 50% circ EF 15%  3.0 x28 and 3.0 x 8 mm Xience stent Dr. Tamala Julian  To LAD   . Cancer ([place])   . Chronic kidney disease   . Chronic systolic CHF (congestive heart failure) (HCC)    ECHO 08/05/13  EF 30%  Anterior akinesis and inferior hypokinesis   . COPD (chronic obstructive pulmonary disease) ([place])   . Hyperlipidemia   . Mass of colon 04/2016  . Myocardial infarction ([place]) 04/29/2013  . Thrombocytopenia ([place]) 12/27/2014   Chronic      SURGICAL HISTORY: Past Surgical History:  Procedure Laterality Date  . CARDIAC CATHETERIZATION  05/2013   . CARDIAC CATHETERIZATION N/A 12/28/2014   Procedure: Left Heart Cath and Coronary Angiography;  Surgeon: [name], MD;  Location: [place] [facility];  Service: Cardiovascular;  Laterality: N/A;  . CARDIAC CATHETERIZATION N/A 12/28/2014   Procedure: Coronary Stent Intervention;  Surgeon: [name], MD;  Location: [place] [facility];  Service: Cardiovascular;  Laterality: N/A;  . COLECTOMY WITH COLOSTOMY CREATION/HARTMANN PROCEDURE Left 05/18/2016   Procedure: COLECTOMY WITH OSTOMY CREATION/HARTMANN PROCEDURE;  Surgeon: Christopher Skeans, MD;  Location: [place];  Service: General;  Laterality: Left;  . COLONOSCOPY WITH PROPOFOL N/A 04/01/2017   Procedure: COLONOSCOPY WITH PROPOFOL;  Surgeon: [name], MD;  Location: [facility];  Service: Gastroenterology;  Laterality: N/A;  . COLOSTOMY TAKEDOWN N/A 05/20/2017   Procedure: COLOSTOMY TAKEDOWN;  Surgeon: Christopher Skeans, MD;  Location: [place];  Service: General;  Laterality: N/A;  . CORONARY ANGIOPLASTY  05/2013   . FLEXIBLE SIGMOIDOSCOPY N/A 05/16/2016   Procedure: FLEXIBLE SIGMOIDOSCOPY;  Surgeon: [name], MD;  Location: [place];  Service:  Gastroenterology;  Laterality: N/A;  . ICD placement  09/07/2013   . IMPLANTABLE CARDIOVERTER DEFIBRILLATOR IMPLANT N/A 09/07/2013   Procedure: IMPLANTABLE CARDIOVERTER DEFIBRILLATOR IMPLANT;  Surgeon: Christopher Lance, MD;  [facility] CATH LAB;  Service: Cardiovascular;  Laterality: N/A;  . INTRA-AORTIC BALLOON PUMP INSERTION  06/20/2013   Procedure: INTRA-AORTIC BALLOON PUMP INSERTION;  Surgeon: Sinclair Grooms, MD;  Location: [facility] CATH LAB;  Service: Cardiovascular;;  . LEFT HEART CATHETERIZATION WITH CORONARY ANGIOGRAM N/A 06/20/2013   Procedure: LEFT HEART CATHETERIZATION WITH CORONARY ANGIOGRAM;  Surgeon: Sinclair Grooms, MD;  Location: [facility] CATH LAB;  Service: Cardiovascular;  Laterality: N/A;  . PERCUTANEOUS CORONARY STENT INTERVENTION (PCI-S)  06/20/2013   Procedure: PERCUTANEOUS CORONARY STENT INTERVENTION (PCI-S);  Surgeon: Sinclair Grooms, MD;  Location: [facility] CATH LAB;  Service: Cardiovascular;;    SOCIAL HISTORY: Social History   Socioeconomic History  . Marital status: Single    Spouse name: Not on file  . Number of children: 0  . Years of education: 27  . Highest education level: Not on file  Occupational History  . Occupation: Unemployed   Social Needs  . Financial resource strain: Not on file  . Food insecurity:    Worry: Not on file    Inability: Not on file  . Transportation needs:    Medical: Not on file    Non-medical: Not on file  Tobacco Use  . Smoking status: Former Smoker    Packs/day: 2.00    Years: 40.00    Pack years: 80.00    Types: Cigarettes    Last attempt to quit: 12/22/2014    Years since quitting: 2.5  . Smokeless tobacco: Never Used  Substance and Sexual Activity  . Alcohol use: No    Comment: quit in 08/2013, he used to drink alcohol moderate (1 pine liquor one week)  for 30 years   . Drug use: No  . Sexual activity: Not Currently    Birth control/protection: Condom    Comment: men   Lifestyle  . Physical activity:    Days per week: Not  on file    Minutes per session: Not on file  . Stress: Not on file  Relationships  . Social connections:    Talks on phone: Not on file    Gets together: Not on file    Attends religious  service: Not on file    Active member of club or organization: Not on file    Attends meetings of clubs or organizations: Not on file    Relationship status: Not on file  . Intimate partner violence:    Fear of current or ex partner: Not on file    Emotionally abused: Not on file    Physically abused: Not on file    Forced sexual activity: Not on file  Other Topics Concern  . Not on file  Social History Narrative   Lives alone.    In a house trying to sell his home. Exercise: No.    FAMILY HISTORY: Family History  Problem Relation Age of Onset  . Emphysema Father   . Heart disease Mother   . Dementia Mother   . Diabetes Mother   . Pancreatic cancer Maternal Grandmother   . Lung cancer Maternal Grandmother   . Cancer Neg Hx     ALLERGIES:  is allergic to capecitabine; celebrex [celecoxib]; and contrast media [iodinated diagnostic agents].  MEDICATIONS:  Current Outpatient Medications  Medication Sig Dispense Refill  . alum & mag hydroxide-simeth (MAALOX/MYLANTA) 200-200-20 MG/5ML suspension Take 30 mLs by mouth every 6 (six) hours as needed for indigestion, heartburn or flatulence. 355 mL 0  . aspirin 81 MG chewable tablet Chew 1 tablet (81 mg total) by mouth daily. 30 tablet 5  . atorvastatin (LIPITOR) 80 MG tablet Take 1 tablet (80 mg total) by mouth daily at 6 PM. 30 tablet 5  . carvedilol (COREG) 3.125 MG tablet Take 1 tablet (3.125 mg total) by mouth 2 (two) times daily with a meal. 60 tablet 0  . clopidogrel (PLAVIX) 75 MG tablet Take 75 mg by mouth daily.    . digoxin (LANOXIN) 0.125 MG tablet Take 1 tablet (0.125 mg total) by mouth daily. 30 tablet 5  . lisinopril (PRINIVIL,ZESTRIL) 2.5 MG tablet Take 1 tablet (2.5 mg total) by mouth daily. 30 tablet 5  . Multiple Vitamin  (MULTIVITAMIN WITH MINERALS) TABS tablet Take 1 tablet by mouth daily. 30 tablet 0  . nitroGLYCERIN (NITROSTAT) 0.4 MG SL tablet Place 1 tablet (0.4 mg total) under the tongue every 5 (five) minutes as needed for chest pain. 20 tablet 0  . saccharomyces boulardii (FLORASTOR) 250 MG capsule Take 1 capsule (250 mg total) by mouth 2 (two) times daily. 30 capsule 0   No current facility-administered medications for this visit.     REVIEW OF SYSTEMS:  Constitutional: Denies fevers, chills or abnormal night sweats , (+) weight loss  Eyes: Denies blurriness of vision, double vision or watery eyes Ears, nose, mouth, throat, and face: Denies mucositis or sore throat  Respiratory: Denies cough, dyspnea or wheezes Cardiovascular: Denies palpitation, chest discomfort or lower extremity swelling Gastrointestinal:  Denies nausea, heartburn (+) constipation Skin: Denies abnormal skin rashes Lymphatics: Denies new lymphadenopathy or easy bruising Neurological:Denies numbness, tingling or new weaknesses Behavioral/Psych: Mood is stable, no new changes  All other systems were reviewed with the patient and are negative.   PHYSICAL EXAMINATION: ECOG PERFORMANCE STATUS: 0 - Asymptomatic  Vitals:   07/17/17 1115  BP: 110/80  Pulse: 77  Resp: 18  Temp: 97.9 F (36.6 C)  SpO2: 100%   Filed Weights   07/17/17 1115  Weight: 155 lb 8 oz (70.5 kg)    GENERAL:alert, no distress and comfortable SKIN: skin color, texture, turgor are normal, no rashes or significant lesions EYES: normal, conjunctiva are pink and non-injected, sclera clear OROPHARYNX:no  exudate, no erythema and lips, buccal mucosa, and tongue normal  NECK: supple, thyroid normal size, non-tender, without nodularity LYMPH:  no palpable lymphadenopathy in the cervical, axillary or inguinal LUNGS: clear to auscultation and percussion with normal breathing effort HEART: regular rate & rhythm and no murmurs and no lower extremity  edema ABDOMEN:abdomen soft, non-tender and normal bowel sounds (+) incision is pretty clean, with a large scar at midline immediately above umbilicus, healed very well. Colostomy site is clean, non-tender, and healing well.  Musculoskeletal:no cyanosis of digits and no clubbing  PSYCH: alert & oriented x 3 with fluent speech NEURO: no focal motor/sensory deficits  LABORATORY DATA:  I have reviewed the data as listed CBC Latest Ref Rng & Units 07/17/2017 06/15/2017 06/14/2017  WBC 4.0 - 10.3 K/uL 5.6 6.1 7.8  Hemoglobin 13.0 - 17.1 g/dL 11.3(L) 9.2(L) 10.6(L)  Hematocrit 38.4 - 49.9 % 35.0(L) 28.2(L) 32.9(L)  Platelets 140 - 400 K/uL 161 145(L) 162    CMP Latest Ref Rng & Units 07/17/2017 06/16/2017 06/15/2017  Glucose 70 - 140 mg/dL 112 93 83  BUN 7 - 26 mg/dL 19 7 9   Creatinine 0.70 - 1.30 mg/dL 1.14 1.36(H) 1.38(H)  Sodium 136 - 145 mmol/L 139 138 139  Potassium 3.5 - 5.1 mmol/L 4.9 3.8 4.0  Chloride 98 - 109 mmol/L 105 104 104  CO2 22 - 29 mmol/L 29 26 27   Calcium 8.4 - 10.4 mg/dL 9.7 8.0(L) 8.2(L)  Total Protein 6.4 - 8.3 g/dL 6.7 - -  Total Bilirubin 0.2 - 1.2 mg/dL 0.3 - -  Alkaline Phos 40 - 150 U/L 59 - -  AST 5 - 34 U/L 14 - -  ALT 0 - 55 U/L 7 - -   PATHOLOGY: Diagnosis 05/20/2017 Stoma - FINDINGS CONSISTENT WITH OSTOMY. - NO DYSPLASIA OR MALIGNANCY.  Diagnosis 04/01/2017 Colon, polyp(s), cecal cold snare - TUBULAR ADENOMA (ONE FRAGMENT). - NO HIGH GRADE DYSPLASIA OR MALIGNANCY.  Diagnosis 05/18/16 Colon, segmental resection for tumor, Left - INVASIVE COLORECTAL ADENOCARCINOMA, 4.6 CM. - TUMOR EXTENDS INTO SUBSEROSAL CONNECTIVE TISSUE. - MARGINS NOT INVOLVED. - METASTATIC CARCINOMA IN ONE OF TWELVE LYMPH NODES (1/12). Microscopic Comment COLON AND RECTUM (INCLUDING TRANS-ANAL RESECTION): Specimen: Left colon. Procedure: Segmental resection. Tumor site: Descending colon. Specimen integrity: Intact. Macroscopic intactness of mesorectum: Not  applicable. Macroscopic tumor perforation: No Invasive tumor: Maximum size: 4.6 cm Histologic type(s): Colorectal adenocarcinoma Histologic grade and differentiation: G2: moderately differentiated/low grade Type of polyp in which invasive carcinoma arose: No residual polyp Microscopic extension of invasive tumor: Through muscularis propria into subserosal connective tissue. Lymph-Vascular invasion: Present. Peri-neural invasion: No Tumor deposit(s) (discontinuous extramural extension): No Resection margins: Proximal margin: Free of tumor Distal margin: Free of tumor Circumferential (radial) (posterior ascending, posterior descending; lateral and posterior mid-rectum; and entire lower 1/3 rectum): Free of tumor Mesenteric margin (sigmoid and transverse): N/A Distance closest margin (if all above margins negative): 0.5 cm from radial margin Treatment effect (neo-adjuvant therapy): No Microscopic Comment(continued) Additional polyp(s): No Non-neoplastic findings: N/A Lymph nodes: number examined 12; number positive: 1 Pathologic Staging: pT3, pN1a, pMX Ancillary studies: MMR by IHC and MSI by PCR   Diagnosis 05/16/16 Colon, biopsy, Left Descending - ADENOCARCINOMA.  PROCEDURES:  ECHO 05/17/16 Study Conclusions - Left ventricle: The cavity size was mildly dilated. Wall   thickness was normal. Systolic function was severely reduced. The   estimated ejection fraction was in the range of 25% to 30%.   Akinesis of the anteroseptal, anterior, and apical myocardium.  Doppler parameters are consistent with abnormal left ventricular   relaxation (grade 1 diastolic dysfunction).   Colonoscopy:  04/01/2017 - One 6 mm polyp in the cecum, removed with a cold snare. Resected and retrieved. - Diverticulosis in the left colon.  05/16/16 - Likely malignant completely obstructing tumor in the distal descending colon. Biopsied. - Diverticulosis in the left colon.   RADIOGRAPHIC  STUDIES: I have personally reviewed the radiological images as listed and agreed with the findings in the report. No results found.   CT Chest W Contrast 05/17/16 IMPRESSION: Negative for metastatic or acute abnormality in the chest  CT AP W Contrast 05/09/16 IMPRESSION: 1. 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 There is masslike thickening in this region and extensive proximal stool retention. Mesocolic adenopathy, presumed metastatic node. 2. Patient experienced mild contrast reaction as described above. He return to baseline without treatment. Patient is being transferred to ER for evaluation of faintness that was present prior to contrast. 3.  Aortic Atherosclerosis (ICD10-170.0), age advanced 70. Remote left ventricular infarct with dilatation. 5. Subcentimeter hypervascular focus in the upper liver, not typical for metastatic disease. Attention on follow-up   ASSESSMENT & PLAN: Christopher [name] is a 57 y.o. male who has a history of CAD and CHF, Chronic Kidney Disease, and HLD. He is here for treatment for his colon cancer.   1. Invasive Colorectal Adenocarcinoma of the Left Colon, pT3N1aM0,  Stage IIIB, MSI-stable  -I reviewed his surgical pathology finding and scan findings with patient in great details. -He had a complete surgical resection, 1 positive lymph nodes, locally advanced. Scan showed no evidence of distant metastasis, except a indeterminate tiny liver lesion.   -We discussed the risk of cancer recurrence, which is high due to his stage III disease. I suggest adjuvant chemotherapy due to the high risk of his cancer, which is the standard of care for stage III cancer.  -I previously discussed the standard adjuvant chemotherapy regimen FOLFOX or CAPOX. However patient lives alone, had extensive coronary artery disease and heart failure, is not willing to consider intravenous chemotherapy. -I previously recommend him to consider single agent capecitabine as adjuvant therapy  for 3-6 months. -He started Xeloda on 6/19, but developed severe diarrhea and weight loss, and stopped after 3 days. -He did try dose reduction on Xeloda, still has significant diarrhea with dehydration and we stopped it. -He has recovered well from chemotherapy.  -He underwent colostomy takedown in April 2019, postop had abdominal abscess which was treated with antibiotics. - Colonoscopy results from 05/20/2017 were negative for recurrence -He is clinically doing well overall, has recovered well from surgery.  Exam was unremarkable, lab results reviewed with patient, no clinical concern for recurrence.  His recent CT scan from May 2019 was negative for recurrence. --I discussed the risk of cancer recurrence in the future. I discussed the surveillance plan, which is a physical exam and lab test (including CBC, CMP and CEA) every 3 months for the first 2 years, then every 6-12 months, colonoscopy in one year, and surveilliance CT scan every 6-12 month for up to 5 year.  -will see him back in 3 months   2. CAD and systolic CHF with EF 79-02% -Has 2 stents placed and a pace maker -He will follow-up with his cardiologist -will watch his fluids balance to avoid overload  -Recent scan shows aortic calcifications. I recommend he follow up with cardiologist or PCP to watch his cholesterol.   3. COPD -recently stopped smoking and was smoking for  40 years 2 packs a day -He has quit smoking     PLAN: -Lab and f/u in 3 months     No orders of the defined types were placed in this encounter.   All questions were answered. The patient knows to call the clinic with any problems, questions or concerns.  I spent 15 minutes counseling the patient face to face. The total time spent in the appointment was 20 minutes and more than 50% was on counseling.  Dierdre Searles Dweik am acting as scribe for Dr. Truitt [name].  I have reviewed the above documentation for accuracy and completeness, and I agree with the  above.     Christopher Merle, MD 07/17/2017

## 2017-07-17 ENCOUNTER — Other Ambulatory Visit: Payer: Self-pay | Admitting: Hematology

## 2017-07-17 ENCOUNTER — Telehealth: Payer: Self-pay | Admitting: Hematology

## 2017-07-17 ENCOUNTER — Encounter: Payer: Self-pay | Admitting: Hematology

## 2017-07-17 ENCOUNTER — Inpatient Hospital Stay: Payer: Medicare Other | Attending: Hematology

## 2017-07-17 ENCOUNTER — Inpatient Hospital Stay (HOSPITAL_BASED_OUTPATIENT_CLINIC_OR_DEPARTMENT_OTHER): Payer: Medicare Other | Admitting: Hematology

## 2017-07-17 VITALS — BP 110/80 | HR 77 | Temp 97.9°F | Resp 18 | Ht 67.5 in | Wt 155.5 lb

## 2017-07-17 DIAGNOSIS — D500 Iron deficiency anemia secondary to blood loss (chronic): Secondary | ICD-10-CM

## 2017-07-17 DIAGNOSIS — K5900 Constipation, unspecified: Secondary | ICD-10-CM | POA: Insufficient documentation

## 2017-07-17 DIAGNOSIS — Z79899 Other long term (current) drug therapy: Secondary | ICD-10-CM

## 2017-07-17 DIAGNOSIS — J449 Chronic obstructive pulmonary disease, unspecified: Secondary | ICD-10-CM | POA: Insufficient documentation

## 2017-07-17 DIAGNOSIS — Z950 Presence of cardiac pacemaker: Secondary | ICD-10-CM

## 2017-07-17 DIAGNOSIS — C186 Malignant neoplasm of descending colon: Secondary | ICD-10-CM | POA: Diagnosis not present

## 2017-07-17 DIAGNOSIS — Z87891 Personal history of nicotine dependence: Secondary | ICD-10-CM

## 2017-07-17 DIAGNOSIS — Z7982 Long term (current) use of aspirin: Secondary | ICD-10-CM | POA: Insufficient documentation

## 2017-07-17 DIAGNOSIS — C189 Malignant neoplasm of colon, unspecified: Secondary | ICD-10-CM

## 2017-07-17 DIAGNOSIS — I2510 Atherosclerotic heart disease of native coronary artery without angina pectoris: Secondary | ICD-10-CM | POA: Diagnosis not present

## 2017-07-17 DIAGNOSIS — R634 Abnormal weight loss: Secondary | ICD-10-CM | POA: Diagnosis not present

## 2017-07-17 DIAGNOSIS — Z9221 Personal history of antineoplastic chemotherapy: Secondary | ICD-10-CM | POA: Insufficient documentation

## 2017-07-17 LAB — IRON AND TIBC
IRON: 49 ug/dL (ref 42–163)
Saturation Ratios: 19 % — ABNORMAL LOW (ref 42–163)
TIBC: 261 ug/dL (ref 202–409)
UIBC: 212 ug/dL

## 2017-07-17 LAB — CBC WITH DIFFERENTIAL/PLATELET
Basophils Absolute: 0 10*3/uL (ref 0.0–0.1)
Basophils Relative: 1 %
EOS ABS: 0.2 10*3/uL (ref 0.0–0.5)
EOS PCT: 4 %
HCT: 35 % — ABNORMAL LOW (ref 38.4–49.9)
Hemoglobin: 11.3 g/dL — ABNORMAL LOW (ref 13.0–17.1)
LYMPHS ABS: 1.8 10*3/uL (ref 0.9–3.3)
Lymphocytes Relative: 32 %
MCH: 30 pg (ref 27.2–33.4)
MCHC: 32.3 g/dL (ref 32.0–36.0)
MCV: 92.9 fL (ref 79.3–98.0)
Monocytes Absolute: 0.5 10*3/uL (ref 0.1–0.9)
Monocytes Relative: 9 %
Neutro Abs: 3 10*3/uL (ref 1.5–6.5)
Neutrophils Relative %: 54 %
PLATELETS: 161 10*3/uL (ref 140–400)
RBC: 3.77 MIL/uL — AB (ref 4.20–5.82)
RDW: 16.4 % — AB (ref 11.0–14.6)
WBC: 5.6 10*3/uL (ref 4.0–10.3)

## 2017-07-17 LAB — COMPREHENSIVE METABOLIC PANEL
ALT: 7 U/L (ref 0–55)
AST: 14 U/L (ref 5–34)
Albumin: 3.6 g/dL (ref 3.5–5.0)
Alkaline Phosphatase: 59 U/L (ref 40–150)
Anion gap: 5 (ref 3–11)
BILIRUBIN TOTAL: 0.3 mg/dL (ref 0.2–1.2)
BUN: 19 mg/dL (ref 7–26)
CO2: 29 mmol/L (ref 22–29)
CREATININE: 1.14 mg/dL (ref 0.70–1.30)
Calcium: 9.7 mg/dL (ref 8.4–10.4)
Chloride: 105 mmol/L (ref 98–109)
GFR calc Af Amer: >60 mL/min (ref >60)
Glucose, Bld: 112 mg/dL (ref 70–140)
Potassium: 4.9 mmol/L (ref 3.5–5.1)
Sodium: 139 mmol/L (ref 136–145)
TOTAL PROTEIN: 6.7 g/dL (ref 6.4–8.3)

## 2017-07-17 LAB — CEA (IN HOUSE-CHCC): CEA (CHCC-IN HOUSE): 3.62 ng/mL (ref 0.00–5.00)

## 2017-07-17 LAB — FERRITIN: Ferritin: 75 ng/mL (ref 22–316)

## 2017-07-17 NOTE — Telephone Encounter (Signed)
Appointments scheduled AVS/Calendar printed per 6/19 los °

## 2017-07-18 ENCOUNTER — Telehealth: Payer: Self-pay

## 2017-07-18 NOTE — Telephone Encounter (Signed)
-----   Message from [name], MD sent at 07/18/2017  8:39 AM EDT ----- Please let pt know his lab results, no concerns, thanks  [name]  07/18/2017

## 2017-07-18 NOTE — Telephone Encounter (Signed)
Per Dr. Burr Medico left voice message that CEA was normal, no concerns.

## 2017-08-20 DIAGNOSIS — E785 Hyperlipidemia, unspecified: Secondary | ICD-10-CM | POA: Diagnosis not present

## 2017-08-20 DIAGNOSIS — M5136 Other intervertebral disc degeneration, lumbar region: Secondary | ICD-10-CM | POA: Diagnosis not present

## 2017-08-20 DIAGNOSIS — I2510 Atherosclerotic heart disease of native coronary artery without angina pectoris: Secondary | ICD-10-CM | POA: Diagnosis not present

## 2017-08-20 DIAGNOSIS — I5022 Chronic systolic (congestive) heart failure: Secondary | ICD-10-CM | POA: Diagnosis not present

## 2017-08-20 DIAGNOSIS — Z955 Presence of coronary angioplasty implant and graft: Secondary | ICD-10-CM | POA: Diagnosis not present

## 2017-08-20 DIAGNOSIS — C186 Malignant neoplasm of descending colon: Secondary | ICD-10-CM | POA: Diagnosis not present

## 2017-08-20 DIAGNOSIS — I252 Old myocardial infarction: Secondary | ICD-10-CM | POA: Diagnosis not present

## 2017-08-20 DIAGNOSIS — E7849 Other hyperlipidemia: Secondary | ICD-10-CM | POA: Diagnosis not present

## 2017-08-20 DIAGNOSIS — Z95810 Presence of automatic (implantable) cardiac defibrillator: Secondary | ICD-10-CM | POA: Diagnosis not present

## 2017-09-24 DIAGNOSIS — C186 Malignant neoplasm of descending colon: Secondary | ICD-10-CM | POA: Diagnosis not present

## 2017-09-24 DIAGNOSIS — I2510 Atherosclerotic heart disease of native coronary artery without angina pectoris: Secondary | ICD-10-CM | POA: Diagnosis not present

## 2017-09-24 DIAGNOSIS — Z955 Presence of coronary angioplasty implant and graft: Secondary | ICD-10-CM | POA: Diagnosis not present

## 2017-09-24 DIAGNOSIS — Z95810 Presence of automatic (implantable) cardiac defibrillator: Secondary | ICD-10-CM | POA: Diagnosis not present

## 2017-09-24 DIAGNOSIS — E7849 Other hyperlipidemia: Secondary | ICD-10-CM | POA: Diagnosis not present

## 2017-09-24 DIAGNOSIS — I252 Old myocardial infarction: Secondary | ICD-10-CM | POA: Diagnosis not present

## 2017-09-24 DIAGNOSIS — M5136 Other intervertebral disc degeneration, lumbar region: Secondary | ICD-10-CM | POA: Diagnosis not present

## 2017-09-24 DIAGNOSIS — I5022 Chronic systolic (congestive) heart failure: Secondary | ICD-10-CM | POA: Diagnosis not present

## 2017-09-24 DIAGNOSIS — E785 Hyperlipidemia, unspecified: Secondary | ICD-10-CM | POA: Diagnosis not present

## 2017-10-09 ENCOUNTER — Encounter: Payer: Self-pay | Admitting: Internal Medicine

## 2017-10-09 ENCOUNTER — Ambulatory Visit (INDEPENDENT_AMBULATORY_CARE_PROVIDER_SITE_OTHER): Payer: Medicare Other | Admitting: Internal Medicine

## 2017-10-09 VITALS — BP 110/78 | HR 66 | Ht 67.5 in | Wt 167.2 lb

## 2017-10-09 DIAGNOSIS — I2510 Atherosclerotic heart disease of native coronary artery without angina pectoris: Secondary | ICD-10-CM | POA: Diagnosis not present

## 2017-10-09 DIAGNOSIS — Z95810 Presence of automatic (implantable) cardiac defibrillator: Secondary | ICD-10-CM

## 2017-10-09 DIAGNOSIS — I255 Ischemic cardiomyopathy: Secondary | ICD-10-CM | POA: Diagnosis not present

## 2017-10-09 DIAGNOSIS — I5022 Chronic systolic (congestive) heart failure: Secondary | ICD-10-CM | POA: Diagnosis not present

## 2017-10-09 LAB — CUP PACEART INCLINIC DEVICE CHECK
Battery Voltage: 3 V
Brady Statistic RV Percent Paced: 0.01 %
Date Time Interrogation Session: 20190911131843
HIGH POWER IMPEDANCE MEASURED VALUE: 71 Ohm
Implantable Lead Model: 6935
Lead Channel Impedance Value: 361 Ohm
Lead Channel Impedance Value: 475 Ohm
Lead Channel Sensing Intrinsic Amplitude: 6.125 mV
Lead Channel Setting Pacing Amplitude: 2 V
Lead Channel Setting Pacing Pulse Width: 0.4 ms
MDC IDC LEAD IMPLANT DT: 20150810
MDC IDC LEAD LOCATION: 753860
MDC IDC MSMT BATTERY REMAINING LONGEVITY: 98 mo
MDC IDC MSMT LEADCHNL RV PACING THRESHOLD AMPLITUDE: 0.5 V
MDC IDC MSMT LEADCHNL RV PACING THRESHOLD PULSEWIDTH: 0.4 ms
MDC IDC PG IMPLANT DT: 20150810
MDC IDC SET LEADCHNL RV SENSING SENSITIVITY: 0.3 mV

## 2017-10-09 MED ORDER — NITROGLYCERIN 0.4 MG SL SUBL: 0.4000 mg | SUBLINGUAL_TABLET | SUBLINGUAL | 3 refills | Status: DC | PRN

## 2017-10-09 MED ORDER — NITROGLYCERIN 0.4 MG SL SUBL: 0.4000 mg | SUBLINGUAL_TABLET | SUBLINGUAL | 0 refills | Status: DC | PRN

## 2017-10-09 NOTE — Patient Instructions (Signed)

## 2017-10-09 NOTE — Progress Notes (Signed)
HPI Christopher [name] returns today for follow-up. He is a very pleasant 57 year old man with chronic systolic heart failure, and ischemic cardiomyopathy, coronary artery disease status post MI, who developed colon cancer in the interim and underwent resection. He has undergone reanastomosis of his colostomy which was complicated by abscess which required drainage. He is slowly improving. Still some residual abdominal pain. The patient denies chest pain or shortness of breath. He remains anxious. He admits to some dietary indiscretion with sodium. Allergies  Allergen Reactions  . Capecitabine Other (See Comments)    Server dehydration and diarrhea      . Celebrex [Celecoxib] Other (See Comments)    Caused his back to burn  . Contrast Media [Iodinated Diagnostic Agents] Other (See Comments)    Patient feels weakness     Current Outpatient Medications  Medication Sig Dispense Refill  . alum & mag hydroxide-simeth (MAALOX/MYLANTA) 200-200-20 MG/5ML suspension Take 30 mLs by mouth every 6 (six) hours as needed for indigestion, heartburn or flatulence. 355 mL 0  . aspirin 81 MG chewable tablet Chew 1 tablet (81 mg total) by mouth daily. 30 tablet 5  . atorvastatin (LIPITOR) 80 MG tablet Take 1 tablet (80 mg total) by mouth daily at 6 PM. 30 tablet 5  . carvedilol (COREG) 3.125 MG tablet Take 1 tablet (3.125 mg total) by mouth 2 (two) times daily with a meal. 60 tablet 0  . clopidogrel (PLAVIX) 75 MG tablet Take 75 mg by mouth daily.    . digoxin (LANOXIN) 0.125 MG tablet Take 1 tablet (0.125 mg total) by mouth daily. 30 tablet 5  . lisinopril (PRINIVIL,ZESTRIL) 2.5 MG tablet Take 1 tablet (2.5 mg total) by mouth daily. 30 tablet 5  . Multiple Vitamin (MULTIVITAMIN WITH MINERALS) TABS tablet Take 1 tablet by mouth daily. 30 tablet 0  . nitroGLYCERIN (NITROSTAT) 0.4 MG SL tablet Place 1 tablet (0.4 mg total) under the tongue every 5 (five) minutes as needed for chest pain. 20 tablet 3  .  saccharomyces boulardii (FLORASTOR) 250 MG capsule Take 1 capsule (250 mg total) by mouth 2 (two) times daily. 30 capsule 0   No current facility-administered medications for this visit.      Past Medical History:  Diagnosis Date  . Anemia   . Anxiety   . Automatic implantable cardioverter-defibrillator in situ    MDT Aug 2015 Dr. Lovena Le  . CAD (coronary artery disease), native coronary artery 07/03/2013   Cath 06/20/13  Normal left main, occluded LAD, occluded RCA, 50% circ EF 15%  3.0 x28 and 3.0 x 8 mm Xience stent Dr. Tamala Julian  To LAD   . Cancer ([place])   . Chronic kidney disease   . Chronic systolic CHF (congestive heart failure) ([facility])    ECHO 08/05/13  EF 30%  Anterior akinesis and inferior hypokinesis   . COPD (chronic obstructive pulmonary disease) ([place])   . Hyperlipidemia   . Mass of colon 04/2016  . Myocardial infarction ([place]) 04/29/2013  . Thrombocytopenia ([place]) 12/27/2014   Chronic      ROS:   All systems reviewed and negative except as noted in the HPI.   Past Surgical History:  Procedure Laterality Date  . CARDIAC CATHETERIZATION  05/2013   . CARDIAC CATHETERIZATION N/A 12/28/2014   Procedure: Left Heart Cath and Coronary Angiography;  Surgeon: [name], MD;  Location: [facility];  Service: Cardiovascular;  Laterality: N/A;  . CARDIAC CATHETERIZATION N/A 12/28/2014   Procedure: Coronary  Stent Intervention;  Surgeon: [name], MD;  Location: [facility];  Service: Cardiovascular;  Laterality: N/A;  . COLECTOMY WITH COLOSTOMY CREATION/HARTMANN PROCEDURE Left 05/18/2016   Procedure: COLECTOMY WITH OSTOMY CREATION/HARTMANN PROCEDURE;  Surgeon: [name], MD;  Location: [facility];  Service: General;  Laterality: Left;  . COLONOSCOPY WITH PROPOFOL N/A 04/01/2017   Procedure: COLONOSCOPY WITH PROPOFOL;  Surgeon: [name], MD;  Location: [facility];  Service: Gastroenterology;  Laterality: N/A;  . COLOSTOMY TAKEDOWN N/A 05/20/2017   Procedure:  COLOSTOMY TAKEDOWN;  Surgeon: [name], MD;  Location: [facility];  Service: General;  Laterality: N/A;  . CORONARY ANGIOPLASTY  05/2013   . FLEXIBLE SIGMOIDOSCOPY N/A 05/16/2016   Procedure: FLEXIBLE SIGMOIDOSCOPY;  Surgeon: [name], MD;  Location: [facility];  Service: Gastroenterology;  Laterality: N/A;  . ICD placement  09/07/2013   . IMPLANTABLE CARDIOVERTER DEFIBRILLATOR IMPLANT N/A 09/07/2013   Procedure: IMPLANTABLE CARDIOVERTER DEFIBRILLATOR IMPLANT;  Surgeon: [name], MD;  Location: [facility] C[facility] LAB;  Service: Cardiovascular;  Laterality: N/A;  . INTRA-AORTIC BALLOON PUMP INSERTION  06/20/2013   Procedure: INTRA-AORTIC BALLOON PUMP INSERTION;  Surgeon: Sinclair Grooms, MD;  Location: [facility] C[facility] LAB;  Service: Cardiovascular;;  . LEFT HEART CATHETERIZATION WITH CORONARY ANGIOGRAM N/A 06/20/2013   Procedure: LEFT HEART CATHETERIZATION WITH CORONARY ANGIOGRAM;  Surgeon: Sinclair Grooms, MD;  Location: [facility] CATH LAB;  Service: Cardiovascular;  Laterality: N/A;  . PERCUTANEOUS CORONARY STENT INTERVENTION (PCI-S)  06/20/2013   Procedure: PERCUTANEOUS CORONARY STENT INTERVENTION (PCI-S);  Surgeon: Sinclair Grooms, MD;  Location: [facility];  Service: Cardiovascular;;     Family History  Problem Relation Age of Onset  . Emphysema Father   . Heart disease Mother   . Dementia Mother   . Diabetes Mother   . Pancreatic cancer Maternal Grandmother   . Lung cancer Maternal Grandmother   . Cancer Neg Hx      Social History   Socioeconomic History  . Marital status: Single    Spouse name: Not on file  . Number of children: 0  . Years of education: 79  . Highest education level: Not on file  Occupational History  . Occupation: Unemployed   Social Needs  . Financial resource strain: Not on file  . Food insecurity:    Worry: Not on file    Inability: Not on file  . Transportation needs:    Medical: Not on file    Non-medical: Not on file  Tobacco Use  . Smoking  status: Former Smoker    Packs/day: 2.00    Years: 40.00    Pack years: 80.00    Types: Cigarettes    Last attempt to quit: 12/22/2014    Years since quitting: 2.8  . Smokeless tobacco: Never Used  Substance and Sexual Activity  . Alcohol use: No    Comment: quit in 08/2013, he used to drink alcohol moderate (1 pine liquor one week)  for 30 years   . Drug use: No  . Sexual activity: Not Currently    Birth control/protection: Condom    Comment: men   Lifestyle  . Physical activity:    Days per week: Not on file    Minutes per session: Not on file  . Stress: Not on file  Relationships  . Social connections:    Talks on phone: Not on file    Gets together: Not on file    Attends religious service:  Not on file    Active member of club or organization: Not on file    Attends meetings of clubs or organizations: Not on file    Relationship status: Not on file  . Intimate partner violence:    Fear of current or ex partner: Not on file    Emotionally abused: Not on file    Physically abused: Not on file    Forced sexual activity: Not on file  Other Topics Concern  . Not on file  Social History Narrative   Lives alone.    In a house trying to sell his home. Exercise: No.     BP 110/78   Pulse 66   Ht 5' 7.5" (1.715 m)   Wt 167 lb 3.2 oz (75.8 kg)   SpO2 98%   BMI 25.80 kg/m   Physical Exam:  Well appearing middle aged man, NAD HEENT: Unremarkable Neck:  No JVD, no thyromegally Lymphatics:  No adenopathy Back:  No CVA tenderness Lungs:  Clear with no wheezes HEART:  Regular rate rhythm, no murmurs, no rubs, no clicks Abd:  soft, positive bowel sounds, no organomegally, no rebound, no guarding Ext:  2 plus pulses, no edema, no cyanosis, no clubbing Skin:  No rashes no nodules Neuro:  CN II through XII intact, motor grossly intact  EKG - NSR with anterior MI  DEVICE  Normal device function.  See PaceArt for details.   Assess/Plan: 1. Chronic systolic heart  failure - his symptoms are class 2. He will continue his medical therapy. 2. Colon CA - he is pending repeat colonoscopy in several months. He will also have yearly CT scans. 3. CAD - he denies anginal symptoms.  4. ICD - his Medtronic VVI ICD is working normally.  Christopher [name].D.

## 2017-10-10 ENCOUNTER — Telehealth: Payer: Self-pay | Admitting: Hematology

## 2017-10-10 NOTE — Telephone Encounter (Signed)
YF PAL 9/19. Moved appointments to 10/3. Left message for patient. Schedule mailed.

## 2017-10-17 ENCOUNTER — Other Ambulatory Visit: Payer: Self-pay

## 2017-10-17 ENCOUNTER — Ambulatory Visit: Payer: Self-pay | Admitting: Hematology

## 2017-10-29 NOTE — Progress Notes (Signed)
[name]  [phone] Fax:(336) [phone]  Clinic Follow-up Note   Patient Care Team: System, Pcp Not In as PCP - General Christopher Lance, MD as Consulting Physician (Cardiology) Christopher Reedy, MD as Consulting Physician (Cardiology) Christopher Merle, MD as Consulting Physician (Hematology) Christopher Skeans, MD as Consulting Physician (General Surgery)   Date of Service:  10/31/2017  CHIEF COMPLAINTS:  Follow up left side colon cancer, stage IIIB  Oncology History   Cancer Staging Adenocarcinoma of descending colon  pT3, pN1a, pMX s/p colectomy/ostomy 05/18/2016 Staging form: Colon and Rectum, AJCC 8th Edition - Pathologic stage from 05/18/2016: Stage IIIB (pT3, pN1a, cM0) - Signed by Christopher Merle, MD on 07/01/2016       Adenocarcinoma of descending colon  pT3, pN1a, pMX s/p colectomy/ostomy 05/18/2016   05/09/2016 Imaging    CT chest, abdomen and pelvis showed probable descending colon carcinoma. No evidence of distant metastasis. 7 cm hypervascular focus in the upper liver, not typical for metastatic disease.    05/16/2016 Procedure    Colonoscopy showed a large mass which completely obstructss the distal descending colon. Biopsied. Diverticulosis in the left colon.    05/18/2016 Surgery    Left hemicolectomy by Dr. Grandville [name]    05/18/2016 Pathology Results    Left colon segmental resection shows invasive colorectal adenocarcinoma, 4.6 cm, tumor extends into subserosa connective tissue, margins are negative, metastatic carcinoma in 1 of 12 lymph nodes. Grade 2, lymphovascular invasion present, perineural invasion negative.    05/18/2016 Miscellaneous    MSI-stable     05/18/2016 Initial Diagnosis    Adenocarcinoma of descending colon  pT3, pN1a, pMX s/p colectomy/ostomy 05/18/2016    07/16/2016 - 08/22/2016 Chemotherapy    Xeloda twice a day for 2 weeks on and 1 week off.  stopped after 3 days due to severe diarrhea, and changed to 154m bid, started on  07/31/2016; Hold Xeloda starting 08/22/16 due to AKI. Will lower Xeloda to 3 in the morning and 2 in the evening when restart next cycle.   Stopped due to poor tolerance of sever diarrhea.      01/14/2017 Imaging    CT CAP W Contrast 01/14/17  IMPRESSION: 1. Stable exam. No new or progressive findings in the chest, abdomen, or pelvis. The tiny hypervascular focus in the dome of the liver seen on the previous study is not evident on today's exam. No evidence for metastatic disease on today's CT scan. 2.  Aortic Atherosclerois (ICD10-170.0) 3. Distal transverse end colostomy. 4.  Emphysema. (ICD10-J43.9)    04/01/2017 Procedure    04/01/2017 - One 6 mm polyp in the cecum, removed with a cold snare. Resected and retrieved. - Diverticulosis in the left colon.    04/01/2017 Pathology Results    Colon, polyp(s), cecal cold snare - TUBULAR ADENOMA (ONE FRAGMENT). - NO HIGH GRADE DYSPLASIA OR MALIGNANCY.    05/20/2017 Surgery    Surgery:05/20/2017 COLOSTOMY TAKEDOWN BGeorganna SkeansMD     05/20/2017 Pathology Results    Pathology: 05/20/2017 Stoma - FINDINGS CONSISTENT WITH OSTOMY. - NO DYSPLASIA OR MALIGNANCY.     06/02/2017 Imaging    06/02/2017 CT AP IMPRESSION: 1. Postsurgical wounds in the anterior abdominal wall with skin staples and edema. No dehiscence or discrete fluid collection. 2. Partial colectomy with anastomosis in left hemiabdomen. Mild edema surrounding site of anastomosis, probably postsurgical changes. 3. No abscess, bowel wall thickening, or obstruction identified.    06/12/2017 Imaging    06/12/2017 CT AP IMPRESSION: Improving appearance  to what may have represented a focal anastomotic leak posterior to the left colon at the level of reanastomosis after colostomy takedown. An area of probable extraluminal air and fluid is smaller and contains less extraluminal air. This is likely consistent with resolving abscess. Some residual inflammation remains in this region.  No associated bowel obstruction or free air.     HISTORY OF PRESENTING ILLNESS:  07/02/16 Christopher [name] 57 y.o. male is here because of recent diagnosis of Invasive Colorectal Adenocarcinoma of the Colon. Initially he was bleeding on and off and had a lot of gas and constipation. He had a [place] in December and that did not cover his bowel. He also felt a line appearing on his stomach. These symptoms in October. He also had tingling. He went to ED 05/09/16 for syncope. There he had a CT AP scan that showed evidence of a mass in his colon. Followed by a live Colonoscopy and biopsy on 4/18 done by Dr. Mallie [name] showing evidence the mass was cancer. He had a colectomy surgery 05/18/16 and the results showed to be Invasive colorectal Adenocarcinoma of the Colon. During his colonoscopy process he could not complete pre treatment  Because the cancer cut it off the path to his bowel and caused him to throw up with blood, so he did a live colonoscopy. PCP is Dr. Adrian [name]. He had gotten staph epidermitis in his incision while in the hospital his incision is half healed now.   He has a history of a Psychologist, forensic. He had 2 heart attacks and 2 stents placed and CHF.   He presents to the clinic today post colectomy. He lives by himself and he feels Ok now but he feels like he is not prepared for chemo and the sickness that comes with it. He was told to change his dressing every 3 days and he now changes it everyday. He had painful swallowing after surgery which has resolved and some ulceration in his mouth seen by his dentist. He is taking ferrous sulfate. He has a lot of stress because he is not sure what was really going on. He is taking baby aspirin. His appetite and energy level is back to normal. He lost weight after surgery. He lost 20 pounds since surgery. He has no feet swelling and he pain on his chest around his pace maker which was put in August 2015. He gets regularly checked for HIV. He wants to know why he cannot use  target therapy. He does not want to do IV Chemotherapy. His regular cardiologist keeps him on his regular heart medication and after the surgery he got nitro. He is going to see his cardiologist on the 19th.    CURRENT THERAPY:  Surveillance   INTERVAL HISTORY:  Christopher [name] is here for a follow-up. He was last seen by me in June. Since our last visit, he has seen Cardiologist Dr. Lovena Le for CHF and CAD.  Today, he is here alone at the clinic. He feels well and is eating well. He states that he has healed well after his colostomy takedown. His BM are good. He states that he uses Activia yogurt.   No chest discomfort or bloating.   MEDICAL HISTORY:  Past Medical History:  Diagnosis Date  . Anemia   . Anxiety   . Automatic implantable cardioverter-defibrillator in situ    MDT Aug 2015 Dr. Lovena Le  . CAD (coronary artery disease), native coronary artery 07/03/2013   Cath 06/20/13  Normal left  main, occluded LAD, occluded RCA, 50% circ EF 15%  3.0 x28 and 3.0 x 8 mm Xience stent Dr. Tamala Julian  To LAD   . Cancer ([facility] [facility])   . Chronic kidney disease   . Chronic systolic CHF (congestive heart failure) ([facility])    ECHO 08/05/13  EF 30%  Anterior akinesis and inferior hypokinesis   . COPD (chronic obstructive pulmonary disease) ([facility])   . Hyperlipidemia   . Mass of colon 04/2016  . Myocardial infarction ([facility]) 04/29/2013  . Thrombocytopenia ([facility]) 12/27/2014   Chronic      SURGICAL HISTORY: Past Surgical History:  Procedure Laterality Date  . CARDIAC CATHETERIZATION  05/2013   . CARDIAC CATHETERIZATION N/A 12/28/2014   Procedure: Left Heart Cath and Coronary Angiography;  Surgeon: [name], MD;  Location: [facility];  Service: Cardiovascular;  Laterality: N/A;  . CARDIAC CATHETERIZATION N/A 12/28/2014   Procedure: Coronary Stent Intervention;  Surgeon: [name], MD;  Location: [facility];  Service: Cardiovascular;  Laterality: N/A;  . COLECTOMY WITH COLOSTOMY  CREATION/HARTMANN PROCEDURE Left 05/18/2016   Procedure: COLECTOMY WITH OSTOMY CREATION/HARTMANN PROCEDURE;  Surgeon: Christopher Skeans, MD;  Location: [facility];  Service: General;  Laterality: Left;  . COLONOSCOPY WITH PROPOFOL N/A 04/01/2017   Procedure: COLONOSCOPY WITH PROPOFOL;  Surgeon: [name], MD;  Location: [facility];  Service: Gastroenterology;  Laterality: N/A;  . COLOSTOMY TAKEDOWN N/A 05/20/2017   Procedure: COLOSTOMY TAKEDOWN;  Surgeon: Christopher Skeans, MD;  Location: [facility];  Service: General;  Laterality: N/A;  . CORONARY ANGIOPLASTY  05/2013   . FLEXIBLE SIGMOIDOSCOPY N/A 05/16/2016   Procedure: FLEXIBLE SIGMOIDOSCOPY;  Surgeon: [name], MD;  Location: [facility];  Service: Gastroenterology;  Laterality: N/A;  . ICD placement  09/07/2013   . IMPLANTABLE CARDIOVERTER DEFIBRILLATOR IMPLANT N/A 09/07/2013   Procedure: IMPLANTABLE CARDIOVERTER DEFIBRILLATOR IMPLANT;  Surgeon: Christopher Lance, MD;  Location[facility] C[facility] LAB;  Service: Cardiovascular;  Laterality: N/A;  . INTRA-AORTIC BALLOON PUMP INSERTION  06/20/2013   Procedure: INTRA-AORTIC BALLOON PUMP INSERTION;  Surgeon: Sinclair Grooms, MD;  Location: [facility] CATH LAB;  Service: Cardiovascular;;  . LEFT HEART CATHETERIZATION WITH CORONARY ANGIOGRAM N/A 06/20/2013   Procedure: LEFT HEART CATHETERIZATION WITH CORONARY ANGIOGRAM;  Surgeon: Sinclair Grooms, MD;  Location: [facility] CATH LAB;  Service: Cardiovascular;  Laterality: N/A;  . PERCUTANEOUS CORONARY STENT INTERVENTION (PCI-S)  06/20/2013   Procedure: PERCUTANEOUS CORONARY STENT INTERVENTION (PCI-S);  Surgeon: Sinclair Grooms, MD;  Location: [facility] CATH LAB;  Service: Cardiovascular;;    SOCIAL HISTORY: Social History   Socioeconomic History  . Marital status: Single    Spouse name: Not on file  . Number of children: 0  . Years of education: 38  . Highest education level: Not on file  Occupational History  . Occupation: Unemployed   Social Needs  . Financial resource  strain: Not on file  . Food insecurity:    Worry: Not on file    Inability: Not on file  . Transportation needs:    Medical: Not on file    Non-medical: Not on file  Tobacco Use  . Smoking status: Former Smoker    Packs/day: 2.00    Years: 40.00    Pack years: 80.00    Types: Cigarettes    Last attempt to quit: 12/22/2014    Years since quitting: 2.8  . Smokeless tobacco: Never Used  Substance and Sexual Activity  . Alcohol use: No  Comment: quit in 08/2013, he used to drink alcohol moderate (1 pine liquor one week)  for 30 years   . Drug use: No  . Sexual activity: Not Currently    Birth control/protection: Condom    Comment: men   Lifestyle  . Physical activity:    Days per week: Not on file    Minutes per session: Not on file  . Stress: Not on file  Relationships  . Social connections:    Talks on phone: Not on file    Gets together: Not on file    Attends religious service: Not on file    Active member of club or organization: Not on file    Attends meetings of clubs or organizations: Not on file    Relationship status: Not on file  . Intimate partner violence:    Fear of current or ex partner: Not on file    Emotionally abused: Not on file    Physically abused: Not on file    Forced sexual activity: Not on file  Other Topics Concern  . Not on file  Social History Narrative   Lives alone.    In a house trying to sell his home. Exercise: No.    FAMILY HISTORY: Family History  Problem Relation Age of Onset  . Emphysema Father   . Heart disease Mother   . Dementia Mother   . Diabetes Mother   . Pancreatic cancer Maternal Grandmother   . Lung cancer Maternal Grandmother   . Cancer Neg Hx     ALLERGIES:  is allergic to capecitabine; celebrex [celecoxib]; and contrast media [iodinated diagnostic agents].  MEDICATIONS:  Current Outpatient Medications  Medication Sig Dispense Refill  . alum & mag hydroxide-simeth (MAALOX/MYLANTA) 200-200-20 MG/5ML  suspension Take 30 mLs by mouth every 6 (six) hours as needed for indigestion, heartburn or flatulence. 355 mL 0  . aspirin 81 MG chewable tablet Chew 1 tablet (81 mg total) by mouth daily. 30 tablet 5  . atorvastatin (LIPITOR) 80 MG tablet Take 1 tablet (80 mg total) by mouth daily at 6 PM. 30 tablet 5  . carvedilol (COREG) 3.125 MG tablet Take 1 tablet (3.125 mg total) by mouth 2 (two) times daily with a meal. 60 tablet 0  . clopidogrel (PLAVIX) 75 MG tablet Take 75 mg by mouth daily.    . digoxin (LANOXIN) 0.125 MG tablet Take 1 tablet (0.125 mg total) by mouth daily. 30 tablet 5  . lisinopril (PRINIVIL,ZESTRIL) 2.5 MG tablet Take 1 tablet (2.5 mg total) by mouth daily. 30 tablet 5  . Multiple Vitamin (MULTIVITAMIN WITH MINERALS) TABS tablet Take 1 tablet by mouth daily. 30 tablet 0  . nitroGLYCERIN (NITROSTAT) 0.4 MG SL tablet Place 1 tablet (0.4 mg total) under the tongue every 5 (five) minutes as needed for chest pain. 20 tablet 3  . saccharomyces boulardii (FLORASTOR) 250 MG capsule Take 1 capsule (250 mg total) by mouth 2 (two) times daily. 30 capsule 0   Current Facility-Administered Medications  Medication Dose Route Frequency Provider Last Rate Last Dose  . Influenza vac split quadrivalent PF (FLUARIX) injection 0.5 mL  0.5 mL Intramuscular Once Christopher Merle, MD        REVIEW OF SYSTEMS:  Constitutional: Denies fevers, chills or abnormal night sweats , (+) weight gain Eyes: Denies blurriness of vision, double vision or watery eyes Ears, nose, mouth, throat, and face: Denies mucositis or sore throat  Respiratory: Denies cough, dyspnea or wheezes Cardiovascular: Denies palpitation, chest discomfort  or lower extremity swelling Gastrointestinal:  Denies nausea, heartburn, no new BM changes Skin: Denies abnormal skin rashes Lymphatics: Denies new lymphadenopathy or easy bruising Neurological:Denies numbness, tingling or new weaknesses Behavioral/Psych: Mood is stable, no new changes    All other systems were reviewed with the patient and are negative.   PHYSICAL EXAMINATION: ECOG PERFORMANCE STATUS: 0 - Asymptomatic  Vitals:   10/31/17 1125  BP: 112/83  Pulse: 66  Resp: 17  Temp: 98 F (36.7 C)  SpO2: 99%   Filed Weights   10/31/17 1125  Weight: 169 lb 14.4 oz (77.1 kg)    GENERAL:alert, no distress and comfortable SKIN: skin color, texture, turgor are normal, no rashes or significant lesions EYES: normal, conjunctiva are pink and non-injected, sclera clear OROPHARYNX:no exudate, no erythema and lips, buccal mucosa, and tongue normal  NECK: supple, thyroid normal size, non-tender, without nodularity LYMPH:  no palpable lymphadenopathy in the cervical, axillary or inguinal LUNGS: clear to auscultation and percussion with normal breathing effort HEART: regular rate & rhythm and no murmurs and no lower extremity edema ABDOMEN:abdomen soft, non-tender and normal bowel sounds (+) incision is pretty clean, with a large scar at midline immediately above umbilicus, healed very well. Musculoskeletal:no cyanosis of digits and no clubbing  PSYCH: alert & oriented x 3 with fluent speech NEURO: no focal motor/sensory deficits  LABORATORY DATA:  I have reviewed the data as listed CBC Latest Ref Rng & Units 10/31/2017 07/17/2017 06/15/2017  WBC 4.0 - 10.3 K/uL 7.5 5.6 6.1  Hemoglobin 13.0 - 17.1 g/dL 13.8 11.3(L) 9.2(L)  Hematocrit 38.4 - 49.9 % 41.6 35.0(L) 28.2(L)  Platelets 140 - 400 K/uL 117(L) 161 145(L)    CMP Latest Ref Rng & Units 07/17/2017 06/16/2017 06/15/2017  Glucose 70 - 140 mg/dL 112 93 83  BUN 7 - 26 mg/dL 19 7 9   Creatinine 0.70 - 1.30 mg/dL 1.14 1.36(H) 1.38(H)  Sodium 136 - 145 mmol/L 139 138 139  Potassium 3.5 - 5.1 mmol/L 4.9 3.8 4.0  Chloride 98 - 109 mmol/L 105 104 104  CO2 22 - 29 mmol/L 29 26 27   Calcium 8.4 - 10.4 mg/dL 9.7 8.0(L) 8.2(L)  Total Protein 6.4 - 8.3 g/dL 6.7 - -  Total Bilirubin 0.2 - 1.2 mg/dL 0.3 - -  Alkaline Phos 40 - 150  U/L 59 - -  AST 5 - 34 U/L 14 - -  ALT 0 - 55 U/L 7 - -   Tumor Markers CEA Results for Christopher [name], Christopher [name] (MRN [id number]) as of 10/29/2017 13:52  Ref. Range 05/27/2016 04:08 07/02/2016 15:56 01/14/2017 13:11 07/17/2017 10:59  CEA Latest Ref Range: 0.0 - 4.7 ng/mL 2.8     CEA ([facility]) Latest Ref Range: 0.00 - 5.00 ng/mL  3.25 4.38 3.62     PATHOLOGY: Diagnosis 05/20/2017 Stoma - FINDINGS CONSISTENT WITH OSTOMY. - NO DYSPLASIA OR MALIGNANCY.  Diagnosis 04/01/2017 Colon, polyp(s), cecal cold snare - TUBULAR ADENOMA (ONE FRAGMENT). - NO HIGH GRADE DYSPLASIA OR MALIGNANCY.  Diagnosis 05/18/16 Colon, segmental resection for tumor, Left - INVASIVE COLORECTAL ADENOCARCINOMA, 4.6 CM. - TUMOR EXTENDS INTO SUBSEROSAL CONNECTIVE TISSUE. - MARGINS NOT INVOLVED. - METASTATIC CARCINOMA IN ONE OF TWELVE LYMPH NODES (1/12). Microscopic Comment COLON AND RECTUM (INCLUDING TRANS-ANAL RESECTION): Specimen: Left colon. Procedure: Segmental resection. Tumor site: Descending colon. Specimen integrity: Intact. Macroscopic intactness of mesorectum: Not applicable. Macroscopic tumor perforation: No Invasive tumor: Maximum size: 4.6 cm Histologic type(s): Colorectal adenocarcinoma Histologic grade and differentiation: G2: moderately differentiated/low grade Type of  polyp in which invasive carcinoma arose: No residual polyp Microscopic extension of invasive tumor: Through muscularis propria into subserosal connective tissue. Lymph-Vascular invasion: Present. Peri-neural invasion: No Tumor deposit(s) (discontinuous extramural extension): No Resection margins: Proximal margin: Free of tumor Distal margin: Free of tumor Circumferential (radial) (posterior ascending, posterior descending; lateral and posterior mid-rectum; and entire lower 1/3 rectum): Free of tumor Mesenteric margin (sigmoid and transverse): N/A Distance closest margin (if all above margins negative): 0.5 cm from radial  margin Treatment effect (neo-adjuvant therapy): No Microscopic Comment(continued) Additional polyp(s): No Non-neoplastic findings: N/A Lymph nodes: number examined 12; number positive: 1 Pathologic Staging: pT3, pN1a, pMX Ancillary studies: MMR by IHC and MSI by PCR   Diagnosis 05/16/16 Colon, biopsy, Left Descending - ADENOCARCINOMA.  PROCEDURES:  ECHO 05/17/16 Study Conclusions - Left ventricle: The cavity size was mildly dilated. Wall   thickness was normal. Systolic function was severely reduced. The   estimated ejection fraction was in the range of 25% to 30%.   Akinesis of the anteroseptal, anterior, and apical myocardium.   Doppler parameters are consistent with abnormal left ventricular   relaxation (grade 1 diastolic dysfunction).   Colonoscopy:  04/01/2017 - One 6 mm polyp in the cecum, removed with a cold snare. Resected and retrieved. - Diverticulosis in the left colon.  05/16/16 - Likely malignant completely obstructing tumor in the distal descending colon. Biopsied. - Diverticulosis in the left colon.   RADIOGRAPHIC STUDIES: I have personally reviewed the radiological images as listed and agreed with the findings in the report.  06/12/2017 CT AP IMPRESSION: Improving appearance to what may have represented a focal anastomotic leak posterior to the left colon at the level of reanastomosis after colostomy takedown. An area of probable extraluminal air and fluid is smaller and contains less extraluminal air. This is likely consistent with resolving abscess. Some residual inflammation remains in this region. No associated bowel obstruction or free air.  06/02/2017 CT AP IMPRESSION: 1. Postsurgical wounds in the anterior abdominal wall with skin staples and edema. No dehiscence or discrete fluid collection. 2. Partial colectomy with anastomosis in left hemiabdomen. Mild edema surrounding site of anastomosis, probably postsurgical changes. 3. No abscess,  bowel wall thickening, or obstruction identified.  01/14/2017 CT CAP IMPRESSION: 1. Stable exam. No new or progressive findings in the chest, abdomen, or pelvis. The tiny hypervascular focus in the dome of the liver seen on the previous study is not evident on today's exam. No evidence for metastatic disease on today's CT scan. 2.  Aortic Atherosclerois (ICD10-170.0) 3. Distal transverse end colostomy. 4.  Emphysema. (TSV77-L39.9)  CT Chest W Contrast 05/17/16 IMPRESSION: Negative for metastatic or acute abnormality in the chest  CT AP W Contrast 05/09/16 IMPRESSION: 1. 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 There is masslike thickening in this region and extensive proximal stool retention. Mesocolic adenopathy, presumed metastatic node. 2. Patient experienced mild contrast reaction as described above. He return to baseline without treatment. Patient is being transferred to ER for evaluation of faintness that was present prior to contrast. 3.  Aortic Atherosclerosis (ICD10-170.0), age advanced 2. Remote left ventricular infarct with dilatation. 5. Subcentimeter hypervascular focus in the upper liver, not typical for metastatic disease. Attention on follow-up   ASSESSMENT & PLAN: Christopher [name] is a 57 y.o. male who has a history of CAD and CHF, Chronic Kidney Disease, and HLD. He is here for treatment for his colon cancer.   1. Invasive Colorectal Adenocarcinoma of the Left Colon, pT3N1aM0,  Stage IIIB, MSI-stable  -  I reviewed his surgical pathology finding and scan findings with patient in great details. -He had a complete surgical resection, 1 positive lymph nodes, locally advanced. Scan showed no evidence of distant metastasis, except a indeterminate tiny liver lesion.   -Patient previously declined adjuvant FOLFOX or CAPOX,agreed to start Xeloda on 6/19, but developed severe diarrhea and weight loss, and stopped after 3 days. -He did try dose reduction on Xeloda, still has  significant diarrhea with dehydration and we stopped it. -He has recovered well from chemotherapy.  -He underwent colostomy takedown in April 2019, postop had abdominal abscess which was treated with antibiotics. - Colonoscopy results from 05/20/2017 were negative for recurrence - His recent CT scan from May 2019 was negative for recurrence. -He is clinically doing well overall, has recovered well from surgery.  Exam was unremarkable, lab results reviewed, CBC showed platelets 117K and RDW 15.5. CMP, CEA, and iron studies are pending. no clinical concern for recurrence. -We will repeat scan in march 2020 -will see him back in 3 months for f/u    2. CAD and systolic CHF with EF 18-40% -Has 2 stents placed and a pace maker -He will follow-up with his cardiologist -will watch his fluids balance to avoid overload  -Recent scan shows aortic calcifications. I recommend he follow up with cardiologist or PCP to watch his cholesterol.   3. COPD -recently stopped smoking and was smoking for 40 years 2 packs a day -He has quit smoking     PLAN: -Lab and f/u in 3 months  -plan to repeat scan in 03/2018, will order on next visit    No orders of the defined types were placed in this encounter.   All questions were answered. The patient knows to call the clinic with any problems, questions or concerns.  I spent 15 minutes counseling the patient face to face. The total time spent in the appointment was 20 minutes and more than 50% was on counseling.  Dierdre Searles Dweik am acting as scribe for Dr. Truitt [name].  I have reviewed the above documentation for accuracy and completeness, and I agree with the above.     Christopher Merle, MD 10/31/2017

## 2017-10-31 ENCOUNTER — Telehealth: Payer: Self-pay | Admitting: Hematology

## 2017-10-31 ENCOUNTER — Inpatient Hospital Stay: Payer: Medicare Other | Attending: Hematology

## 2017-10-31 ENCOUNTER — Inpatient Hospital Stay (HOSPITAL_BASED_OUTPATIENT_CLINIC_OR_DEPARTMENT_OTHER): Payer: Medicare Other | Admitting: Hematology

## 2017-10-31 VITALS — BP 112/83 | HR 66 | Temp 98.0°F | Resp 17 | Ht 67.5 in | Wt 169.9 lb

## 2017-10-31 DIAGNOSIS — Z8249 Family history of ischemic heart disease and other diseases of the circulatory system: Secondary | ICD-10-CM

## 2017-10-31 DIAGNOSIS — E785 Hyperlipidemia, unspecified: Secondary | ICD-10-CM | POA: Insufficient documentation

## 2017-10-31 DIAGNOSIS — Z79899 Other long term (current) drug therapy: Secondary | ICD-10-CM

## 2017-10-31 DIAGNOSIS — N189 Chronic kidney disease, unspecified: Secondary | ICD-10-CM

## 2017-10-31 DIAGNOSIS — Z23 Encounter for immunization: Secondary | ICD-10-CM | POA: Insufficient documentation

## 2017-10-31 DIAGNOSIS — I2510 Atherosclerotic heart disease of native coronary artery without angina pectoris: Secondary | ICD-10-CM | POA: Insufficient documentation

## 2017-10-31 DIAGNOSIS — Z87891 Personal history of nicotine dependence: Secondary | ICD-10-CM

## 2017-10-31 DIAGNOSIS — C186 Malignant neoplasm of descending colon: Secondary | ICD-10-CM | POA: Diagnosis not present

## 2017-10-31 DIAGNOSIS — I252 Old myocardial infarction: Secondary | ICD-10-CM

## 2017-10-31 DIAGNOSIS — J449 Chronic obstructive pulmonary disease, unspecified: Secondary | ICD-10-CM | POA: Insufficient documentation

## 2017-10-31 DIAGNOSIS — Z7982 Long term (current) use of aspirin: Secondary | ICD-10-CM

## 2017-10-31 DIAGNOSIS — I5022 Chronic systolic (congestive) heart failure: Secondary | ICD-10-CM

## 2017-10-31 DIAGNOSIS — C189 Malignant neoplasm of colon, unspecified: Secondary | ICD-10-CM

## 2017-10-31 DIAGNOSIS — D500 Iron deficiency anemia secondary to blood loss (chronic): Secondary | ICD-10-CM

## 2017-10-31 LAB — COMPREHENSIVE METABOLIC PANEL
ALT: 20 U/L (ref 0–44)
AST: 19 U/L (ref 15–41)
Albumin: 3.8 g/dL (ref 3.5–5.0)
Alkaline Phosphatase: 58 U/L (ref 38–126)
Anion gap: 7 (ref 5–15)
BUN: 25 mg/dL — ABNORMAL HIGH (ref 6–20)
CHLORIDE: 106 mmol/L (ref 98–111)
CO2: 28 mmol/L (ref 22–32)
CREATININE: 1.29 mg/dL — AB (ref 0.61–1.24)
Calcium: 9.7 mg/dL (ref 8.9–10.3)
GFR calc Af Amer: >60 mL/min (ref >60)
Glucose, Bld: 95 mg/dL (ref 70–99)
POTASSIUM: 4.6 mmol/L (ref 3.5–5.1)
Sodium: 141 mmol/L (ref 135–145)
Total Bilirubin: 0.3 mg/dL (ref 0.3–1.2)
Total Protein: 7 g/dL (ref 6.5–8.1)

## 2017-10-31 LAB — CBC WITH DIFFERENTIAL/PLATELET
BASOS PCT: 1 %
Basophils Absolute: 0 10*3/uL (ref 0.0–0.1)
EOS ABS: 0.2 10*3/uL (ref 0.0–0.5)
Eosinophils Relative: 3 %
HCT: 41.6 % (ref 38.4–49.9)
HEMOGLOBIN: 13.8 g/dL (ref 13.0–17.1)
LYMPHS ABS: 2.2 10*3/uL (ref 0.9–3.3)
Lymphocytes Relative: 29 %
MCH: 29.9 pg (ref 27.2–33.4)
MCHC: 33.1 g/dL (ref 32.0–36.0)
MCV: 90.2 fL (ref 79.3–98.0)
Monocytes Absolute: 0.8 10*3/uL (ref 0.1–0.9)
Monocytes Relative: 10 %
NEUTROS PCT: 57 %
Neutro Abs: 4.3 10*3/uL (ref 1.5–6.5)
Platelets: 117 10*3/uL — ABNORMAL LOW (ref 140–400)
RBC: 4.61 MIL/uL (ref 4.20–5.82)
RDW: 15.5 % — ABNORMAL HIGH (ref 11.0–14.6)
WBC: 7.5 10*3/uL (ref 4.0–10.3)

## 2017-10-31 LAB — CEA (IN HOUSE-CHCC): CEA (CHCC-In House): 3.65 ng/mL (ref 0.00–5.00)

## 2017-10-31 LAB — FERRITIN: Ferritin: 22 ng/mL — ABNORMAL LOW (ref 24–336)

## 2017-10-31 LAB — IRON AND TIBC
Iron: 68 ug/dL (ref 42–163)
Saturation Ratios: 24 % — ABNORMAL LOW (ref 42–163)
TIBC: 283 ug/dL (ref 202–409)
UIBC: 214 ug/dL

## 2017-10-31 MED ORDER — INFLUENZA VAC SPLIT QUAD 0.5 ML IM SUSY
0.5000 mL | PREFILLED_SYRINGE | Freq: Once | INTRAMUSCULAR | Status: AC
  Administered 2017-10-31: 0.5 mL via INTRAMUSCULAR

## 2017-10-31 MED ORDER — INFLUENZA VAC SPLIT QUAD 0.5 ML IM SUSY
PREFILLED_SYRINGE | INTRAMUSCULAR | Status: AC
  Filled 2017-10-31: qty 0.5

## 2017-10-31 NOTE — Telephone Encounter (Signed)
Gave pt avs and calendar  °

## 2017-11-01 ENCOUNTER — Encounter: Payer: Self-pay | Admitting: Hematology

## 2017-11-04 ENCOUNTER — Telehealth: Payer: Self-pay

## 2017-11-04 NOTE — Telephone Encounter (Signed)
Left voice message per Dr. Burr Medico, lab results CEA normal, iron is low, recommend he start oral iron supplement or multi vitamin with minerals.  Encouraged him to call back if he has questions.

## 2017-11-04 NOTE — Telephone Encounter (Signed)
-----   Message from [name], MD sent at 11/03/2017 10:48 PM EDT ----- Please let pt know the lab results, CEA normal, iron slightly low, I recommend oral iron or multivitamin with minerals, thanks   [name]  11/03/2017

## 2017-11-22 ENCOUNTER — Telehealth: Payer: Self-pay | Admitting: Cardiology

## 2017-11-22 MED ORDER — CLOPIDOGREL BISULFATE 75 MG PO TABS: 75.0000 mg | ORAL_TABLET | Freq: Every day | ORAL | 0 refills | Status: DC

## 2017-11-22 NOTE — Telephone Encounter (Signed)
Clopidogrel 75 mg daily refilled per Dr. Agustin Cree.

## 2017-11-22 NOTE — Addendum Note (Signed)
Addended by: Ashok Norris on: 11/22/2017 02:19 PM   Modules accepted: Orders

## 2017-11-22 NOTE — Telephone Encounter (Signed)
°  Second request   1. Which medications need to be refilled? (please list name of each medication and dose if known) clopidogrel 75mg   2. Which pharmacy/location (including street and city if local pharmacy) is medication to be sent to? [facility] [address] [place]  3. Do they need a 30 day or 90 day supply? [place]

## 2017-12-25 ENCOUNTER — Ambulatory Visit (INDEPENDENT_AMBULATORY_CARE_PROVIDER_SITE_OTHER): Payer: Medicare Other

## 2017-12-25 ENCOUNTER — Other Ambulatory Visit: Payer: Self-pay | Admitting: Cardiology

## 2017-12-25 DIAGNOSIS — I2510 Atherosclerotic heart disease of native coronary artery without angina pectoris: Secondary | ICD-10-CM

## 2017-12-25 DIAGNOSIS — I5022 Chronic systolic (congestive) heart failure: Secondary | ICD-10-CM | POA: Diagnosis not present

## 2017-12-25 NOTE — Progress Notes (Signed)
Remote ICD transmission.   

## 2018-01-08 ENCOUNTER — Telehealth: Payer: Self-pay | Admitting: Cardiology

## 2018-01-21 NOTE — Telephone Encounter (Signed)
done

## 2018-01-24 ENCOUNTER — Other Ambulatory Visit: Payer: Self-pay | Admitting: Cardiology

## 2018-01-24 NOTE — Telephone Encounter (Signed)
°  1. Which medications need to be refilled? (please list name of each medication and dose if known) Carvedilol 3.125mg  one tablet twice daily; Digoxin 0.125mg  tablet once daily  2. Which pharmacy/location (including street and city if local pharmacy) is medication to be sent to? [facility] [address] [place]  3. Do they need a 30 day or 90 day supply? [place] [address] [place]

## 2018-01-27 MED ORDER — DIGOXIN 125 MCG PO TABS: 0.1250 mg | ORAL_TABLET | Freq: Every day | ORAL | 0 refills | Status: DC

## 2018-01-27 MED ORDER — CARVEDILOL 3.125 MG PO TABS: 3.1250 mg | ORAL_TABLET | Freq: Two times a day (BID) | ORAL | 0 refills | Status: DC

## 2018-01-27 NOTE — Telephone Encounter (Signed)
Verbal ok to send refills for pt for 1 month no refills.

## 2018-01-27 NOTE — Telephone Encounter (Signed)
Has been given to Dr. Geraldo Pitter to review.

## 2018-01-31 ENCOUNTER — Other Ambulatory Visit: Payer: Self-pay

## 2018-01-31 ENCOUNTER — Telehealth: Payer: Self-pay

## 2018-01-31 DIAGNOSIS — C186 Malignant neoplasm of descending colon: Secondary | ICD-10-CM

## 2018-01-31 DIAGNOSIS — D500 Iron deficiency anemia secondary to blood loss (chronic): Secondary | ICD-10-CM

## 2018-01-31 NOTE — Telephone Encounter (Signed)
Patient called because he is now 50 he read that he needs to have his prostate tested and is requesting we add a PSA to his labs for next Thursday.

## 2018-02-02 ENCOUNTER — Other Ambulatory Visit: Payer: Self-pay | Admitting: Hematology

## 2018-02-02 DIAGNOSIS — Z125 Encounter for screening for malignant neoplasm of prostate: Secondary | ICD-10-CM

## 2018-02-02 NOTE — Telephone Encounter (Signed)
Order is in now, thanks   [name] MD

## 2018-02-05 NOTE — Progress Notes (Signed)
Christopher [name]   [phone] [phone]   Clinic Follow up Note   Patient Care Team: System, Pcp Not In as PCP - General [name], MD as Consulting Physician (Cardiology) [name], MD as Consulting Physician (Cardiology) [name], MD as Consulting Physician (Hematology) [name], MD as Consulting Physician (General Surgery) 02/06/2018  CHIEF COMPLAINT: F/u on left side colon cancer, stage IIIB   SUMMARY OF ONCOLOGIC HISTORY: Oncology History   Cancer Staging Adenocarcinoma of descending colon  pT3, pN1a, pMX s/p colectomy/ostomy 05/18/2016 Staging form: Colon and Rectum, AJCC 8th Edition - Pathologic stage from 05/18/2016: Stage IIIB (pT3, pN1a, cM0) - Signed by [name], MD on 07/01/2016       Adenocarcinoma of descending colon  pT3, pN1a, pMX s/p colectomy/ostomy 05/18/2016   05/09/2016 Imaging    CT chest, abdomen and pelvis showed probable descending colon carcinoma. No evidence of distant metastasis. 7 cm hypervascular focus in the upper liver, not typical for metastatic disease.    05/16/2016 Procedure    Colonoscopy showed a large mass which completely obstructss the distal descending colon. Biopsied. Diverticulosis in the left colon.    05/18/2016 Surgery    Left hemicolectomy by Dr. [name]    05/18/2016 Pathology Results    Left colon segmental resection shows invasive colorectal adenocarcinoma, 4.6 cm, tumor extends into subserosa connective tissue, margins are negative, metastatic carcinoma in 1 of 12 lymph nodes. Grade 2, lymphovascular invasion present, perineural invasion negative.    05/18/2016 Miscellaneous    MSI-stable     05/18/2016 Initial Diagnosis    Adenocarcinoma of descending colon  pT3, pN1a, pMX s/p colectomy/ostomy 05/18/2016    07/16/2016 - 08/22/2016 Chemotherapy    Xeloda twice a day for 2 weeks on and 1 week off.  stopped after 3 days due to severe diarrhea, and changed to 159m bid, started  on 07/31/2016; Hold Xeloda starting 08/22/16 due to AKI. Will lower Xeloda to 3 in the morning and 2 in the evening when restart next cycle.   Stopped due to poor tolerance of sever diarrhea.      01/14/2017 Imaging    CT CAP W Contrast 01/14/17  IMPRESSION: 1. Stable exam. No new or progressive findings in the chest, abdomen, or pelvis. The tiny hypervascular focus in the dome of the liver seen on the previous study is not evident on today's exam. No evidence for metastatic disease on today's CT scan. 2.  Aortic Atherosclerois (ICD10-170.0) 3. Distal transverse end colostomy. 4.  Emphysema. (ICD10-J43.9)    04/01/2017 Procedure    04/01/2017 - One 6 mm polyp in the cecum, removed with a cold snare. Resected and retrieved. - Diverticulosis in the left colon.    04/01/2017 Pathology Results    Colon, polyp(s), cecal cold snare - TUBULAR ADENOMA (ONE FRAGMENT). - NO HIGH GRADE DYSPLASIA OR MALIGNANCY.    05/20/2017 Surgery    Surgery:05/20/2017 COLOSTOMY TAKEDOWN BGeorganna SkeansMD     05/20/2017 Pathology Results    Pathology: 05/20/2017 Stoma - FINDINGS CONSISTENT WITH OSTOMY. - NO DYSPLASIA OR MALIGNANCY.     06/02/2017 Imaging    06/02/2017 CT AP IMPRESSION: 1. Postsurgical wounds in the anterior abdominal wall with skin staples and edema. No dehiscence or discrete fluid collection. 2. Partial colectomy with anastomosis in left hemiabdomen. Mild edema surrounding site of anastomosis, probably postsurgical changes. 3. No abscess, bowel wall thickening, or obstruction identified.    06/12/2017 Imaging    06/12/2017 CT AP IMPRESSION: Improving  appearance to what may have represented a focal anastomotic leak posterior to the left colon at the level of reanastomosis after colostomy takedown. An area of probable extraluminal air and fluid is smaller and contains less extraluminal air. This is likely consistent with resolving abscess. Some residual inflammation remains in this  region. No associated bowel obstruction or free air.     CURRENT THERAPY Surveillance   INTERVAL HISTORY: Christopher [name] is a 58 y.o. male who is here for follow-up. Today, he is here alone. He is interested in PSA testing. His bowel habits are regular and he denies constipation or diarrhea. He noticed intermittent planter pain. He denies pain in toes or skin rash or peeling.    Pertinent positives and negatives of review of systems are listed and detailed within the above HPI.  REVIEW OF SYSTEMS:   Constitutional: Denies fevers, chills or abnormal weight loss Eyes: Denies blurriness of vision Ears, nose, mouth, throat, and face: Denies mucositis or sore throat Respiratory: Denies cough, dyspnea or wheezes Cardiovascular: Denies palpitation, chest discomfort or lower extremity swelling Gastrointestinal:  Denies nausea, heartburn or change in bowel habits Skin: Denies abnormal skin rashes Lymphatics: Denies new lymphadenopathy or easy bruising Neurological:Denies numbness, tingling or new weaknesses MSK: (+) intermittent planter pain  Behavioral/Psych: Mood is stable, no new changes  All other systems were reviewed with the patient and are negative.  MEDICAL HISTORY:  Past Medical History:  Diagnosis Date  . Adenocarcinoma of descending colon  pT3, pN1a, pMX s/p colectomy/ostomy 05/18/2016 05/26/2016  . AICD (automatic cardioverter/defibrillator) present    09/07/13 Dr. Lovena Le post MI with EF 30% RV lead      Medtronic 6947 (serial number [id number]) Generator   Medtronic  Evera XT VR (serial Number [id number])   . Anemia   . Anemia due to blood loss 05/03/2016  . Anxiety   . Anxiety state 01/17/2015  . Automatic implantable cardioverter-defibrillator in situ    MDT Aug 2015 Dr. Lovena Le  . Benign neoplasm of cecum   . CAD (coronary artery disease), native coronary artery 07/03/2013   Cath 06/20/13  Normal left main, occluded LAD, occluded RCA, 50% circ EF 15%  3.0 x28 and 3.0 x 8 mm  Xience stent Dr. Tamala Julian  To LAD   . Cancer ([place])   . Cardiomyopathy, ischemic   . Chronic anticoagulation 06/12/2017  . Chronic kidney disease   . Chronic systolic CHF (congestive heart failure) (HCC)    ECHO 08/05/13  EF 30%  Anterior akinesis and inferior hypokinesis   . COPD (chronic obstructive pulmonary disease) ([place])   . Essential hypertension 05/16/2016  . Financial difficulties 10/01/2013  . History of colon cancer   . Hyperlipidemia   . Intra-abdominal abscess 06/12/2017  . Iron deficiency anemia due to chronic blood loss 07/02/2016  . Mass of colon 04/2016  . Myocardial infarction ([place]) 04/29/2013  . Old anterior myocardial infarction 06/20/2013  . Partial bowel obstruction ([place]) 05/16/2016  . S/P colostomy takedown 05/20/2017  . Thrombocytopenia ([place]) 12/27/2014   Chronic    . Tobacco use 07/03/2013    SURGICAL HISTORY: Past Surgical History:  Procedure Laterality Date  . CARDIAC CATHETERIZATION  05/2013   . CARDIAC CATHETERIZATION N/A 12/28/2014   Procedure: Left Heart Cath and Coronary Angiography;  Surgeon: [name], MD;  Location: [facility] CV [facility];  Service: Cardiovascular;  Laterality: N/A;  . CARDIAC CATHETERIZATION N/A 12/28/2014   Procedure: Coronary Stent Intervention;  Surgeon: [name], MD;  Location: [facility]  INVASIVE CV LAB;  Service: Cardiovascular;  Laterality: N/A;  . COLECTOMY WITH COLOSTOMY CREATION/HARTMANN PROCEDURE Left 05/18/2016   Procedure: COLECTOMY WITH OSTOMY CREATION/HARTMANN PROCEDURE;  Surgeon: [name], MD;  Location: [place];  Service: General;  Laterality: Left;  . COLONOSCOPY WITH PROPOFOL N/A 04/01/2017   Procedure: COLONOSCOPY WITH PROPOFOL;  Surgeon: [name], MD;  Location: [facility] ENDOS[facility];  Service: Gastroenterology;  Laterality: N/A;  . COLOSTOMY TAKEDOWN N/A 05/20/2017   Procedure: COLOSTOMY TAKEDOWN;  Surgeon: [name], MD;  Location: [place];  Service: General;  Laterality: N/A;  . CORONARY ANGIOPLASTY  05/2013   .  FLEXIBLE SIGMOIDOSCOPY N/A 05/16/2016   Procedure: FLEXIBLE SIGMOIDOSCOPY;  Surgeon: [name], MD;  Location: [place];  Service: Gastroenterology;  Laterality: N/A;  . HAND SURGERY Right    cyst removal  . ICD placement  09/07/2013   . IMPLANTABLE CARDIOVERTER DEFIBRILLATOR IMPLANT N/A 09/07/2013   Procedure: IMPLANTABLE CARDIOVERTER DEFIBRILLATOR IMPLANT;  Surgeon: [name], MD;  Location: [facility] CATH LAB;  Service: Cardiovascular;  Laterality: N/A;  . INTRA-AORTIC BALLOON PUMP INSERTION  06/20/2013   Procedure: INTRA-AORTIC BALLOON PUMP INSERTION;  Surgeon: Sinclair Grooms, MD;  Location: [facility] CATH LAB;  Service: Cardiovascular;;  . LEFT HEART CATHETERIZATION WITH CORONARY ANGIOGRAM N/A 06/20/2013   Procedure: LEFT HEART CATHETERIZATION WITH CORONARY ANGIOGRAM;  Surgeon: Sinclair Grooms, MD;  Location: [facility] CATH LAB;  Service: Cardiovascular;  Laterality: N/A;  . PERCUTANEOUS CORONARY STENT INTERVENTION (PCI-S)  06/20/2013   Procedure: PERCUTANEOUS CORONARY STENT INTERVENTION (PCI-S);  Surgeon: Sinclair Grooms, MD;  Location: [facility] CATH LAB;  Service: Cardiovascular;;    I have reviewed the social history and family history with the patient and they are unchanged from previous note.  ALLERGIES:  is allergic to capecitabine; celebrex [celecoxib]; and contrast media [iodinated diagnostic agents].  MEDICATIONS:  Current Outpatient Medications  Medication Sig Dispense Refill  . alum & mag hydroxide-simeth (MAALOX/MYLANTA) 200-200-20 MG/5ML suspension Take 30 mLs by mouth every 6 (six) hours as needed for indigestion, heartburn or flatulence. 355 mL 0  . aspirin 81 MG chewable tablet Chew 1 tablet (81 mg total) by mouth daily. 30 tablet 5  . atorvastatin (LIPITOR) 80 MG tablet Take 1 tablet (80 mg total) by mouth daily at 6 PM. 30 tablet 5  . carvedilol (COREG) 3.125 MG tablet Take 1 tablet (3.125 mg total) by mouth 2 (two) times daily with a meal. 60 tablet 0  . clopidogrel (PLAVIX)  75 MG tablet TAKE 1 TABLET BY MOUTH ONCE DAILY 30 tablet 2  . digoxin (LANOXIN) 0.125 MG tablet Take 1 tablet (0.125 mg total) by mouth daily. 30 tablet 0  . lisinopril (PRINIVIL,ZESTRIL) 2.5 MG tablet Take 1 tablet (2.5 mg total) by mouth daily. 30 tablet 5  . Multiple Vitamin (MULTIVITAMIN WITH MINERALS) TABS tablet Take 1 tablet by mouth daily. 30 tablet 0  . nitroGLYCERIN (NITROSTAT) 0.4 MG SL tablet Place 1 tablet (0.4 mg total) under the tongue every 5 (five) minutes as needed for chest pain. 20 tablet 3  . saccharomyces boulardii (FLORASTOR) 250 MG capsule Take 1 capsule (250 mg total) by mouth 2 (two) times daily. 30 capsule 0   No current facility-administered medications for this visit.     PHYSICAL EXAMINATION: ECOG PERFORMANCE STATUS: 0 - Asymptomatic  Vitals:   02/06/18 1122  BP: 124/84  Pulse: 69  Resp: 18  Temp: 98 F (36.7 C)  SpO2: 99%   Filed Weights  02/06/18 1122  Weight: 177 lb 9.6 oz (80.6 kg)    GENERAL:alert, no distress and comfortable SKIN: skin color, texture, turgor are normal, no rashes or significant lesions EYES: normal, Conjunctiva are pink and non-injected, sclera clear OROPHARYNX:no exudate, no erythema and lips, buccal mucosa, and tongue normal  NECK: supple, thyroid normal size, non-tender, without nodularity LYMPH:  no palpable lymphadenopathy in the cervical, axillary or inguinal LUNGS: clear to auscultation and percussion with normal breathing effort HEART: regular rate & rhythm and no murmurs and no lower extremity edema ABDOMEN:abdomen soft, non-tender and normal bowel sounds (+) surgical scar healed well with mild numbness around it  Musculoskeletal:no cyanosis of digits and no clubbing  NEURO: alert & oriented x 3 with fluent speech, no focal motor/sensory deficits  LABORATORY DATA:  I have reviewed the data as listed CBC Latest Ref Rng & Units 02/06/2018 10/31/2017 07/17/2017  WBC 4.0 - 10.5 K/uL 6.7 7.5 5.6  Hemoglobin 13.0 - 17.0  g/dL 13.7 13.8 11.3(L)  Hematocrit 39.0 - 52.0 % 41.6 41.6 35.0(L)  Platelets 150 - 400 K/uL 131(L) 117(L) 161     CMP Latest Ref Rng & Units 02/06/2018 10/31/2017 07/17/2017  Glucose 70 - 99 mg/dL 99 95 112  BUN 6 - 20 mg/dL 24(H) 25(H) 19  Creatinine 0.61 - 1.24 mg/dL 1.18 1.29(H) 1.14  Sodium 135 - 145 mmol/L 140 141 139  Potassium 3.5 - 5.1 mmol/L 4.9 4.6 4.9  Chloride 98 - 111 mmol/L 107 106 105  CO2 22 - 32 mmol/L _0 Calcium 8.9 - 10.3 mg/dL 9.7 9.7 9.7  Total Protein 6.5 - 8.1 g/dL 7.1 7.0 6.7  Total Bilirubin 0.3 - 1.2 mg/dL 0.5 0.3 0.3  Alkaline Phos 38 - 126 U/L 61 58 59  AST 15 - 41 U/L _1 ALT 0 - 44 U/L _2 RADIOGRAPHIC STUDIES: I have personally reviewed the radiological images as listed and agreed with the findings in the report. No results found.   ASSESSMENT & PLAN:  Christopher [name] is a 58 y.o. male with history of  1. Invasive Colorectal Adenocarcinoma of the Left Colon, pT3N1aM0,  Stage IIIB, MSI-stable  -Diagnosed in 04/2016. Treated with left hemicolectomy and chemo. He is currently on surveillance. -Labs reviewed, CBC showed Hg 13.7 PLT 131K CMP, PSA, CEA, and iron studies pending.  -He states that he previously had a bad reaction to CT iv contrast. Will avoid in the future.  -He is clinically doing very well, asymptomatic, exam was unremarkable, no clinical concern for recurrence. -Plan to repeat scan in May 2020 and f/u after scan -Continue colon cancer surveillance, a total of 5 years.   2. CAD and systolic CHF with EF 02-58% -s/p 2 stent placements and a pace maker. -f/u with cardiology  3. COPD -He previously quit smoking -Stable  4. Planter pain -He noticed intermittent planter pain when he walks, but denies toe pain or skin rash or breakdown -We will monitor clinically.  Plan  -f/u in mid May with labs and CT CAP w oral contrast only a few days before   No problem-specific Assessment & Plan notes found for this  encounter.   Orders Placed This Encounter  Procedures  . CT Abdomen Pelvis Wo Contrast    Standing Status:   Future    Standing Expiration Date:   02/06/2019    Order Specific Question:   Preferred imaging location?    Answer:  [facility]    Order Specific Question:   Is Oral Contrast requested for this exam?    Answer:   Yes, Per Radiology protocol    Order Specific Question:   Radiology Contrast Protocol - do NOT remove file path    Answer:   \\[email or web address]  . CT Chest Wo Contrast    Standing Status:   Future    Standing Expiration Date:   02/06/2019    Order Specific Question:   Preferred imaging location?    Answer:   [facility]    Order Specific Question:   Radiology Contrast Protocol - do NOT remove file path    Answer:   \\[email or web address]   All questions were answered. The patient knows to call the clinic with any problems, questions or concerns. No barriers to learning was detected. I spent 15 minutes counseling the patient face to face. The total time spent in the appointment was 20 minutes and more than 50% was on counseling and review of test results  I, Noor Dweik am acting as scribe for Dr. [name].  I have reviewed the above documentation for accuracy and completeness, and I agree with the above.     [name], MD 02/06/2018

## 2018-02-06 ENCOUNTER — Inpatient Hospital Stay (HOSPITAL_BASED_OUTPATIENT_CLINIC_OR_DEPARTMENT_OTHER): Payer: Medicare Other | Admitting: Hematology

## 2018-02-06 ENCOUNTER — Telehealth: Payer: Self-pay

## 2018-02-06 ENCOUNTER — Inpatient Hospital Stay: Payer: Medicare Other | Attending: Hematology

## 2018-02-06 VITALS — BP 124/84 | HR 69 | Temp 98.0°F | Resp 18 | Ht 67.5 in | Wt 177.6 lb

## 2018-02-06 DIAGNOSIS — J449 Chronic obstructive pulmonary disease, unspecified: Secondary | ICD-10-CM

## 2018-02-06 DIAGNOSIS — C186 Malignant neoplasm of descending colon: Secondary | ICD-10-CM

## 2018-02-06 DIAGNOSIS — R972 Elevated prostate specific antigen [PSA]: Secondary | ICD-10-CM

## 2018-02-06 DIAGNOSIS — C189 Malignant neoplasm of colon, unspecified: Secondary | ICD-10-CM

## 2018-02-06 DIAGNOSIS — Z87891 Personal history of nicotine dependence: Secondary | ICD-10-CM | POA: Insufficient documentation

## 2018-02-06 DIAGNOSIS — I2510 Atherosclerotic heart disease of native coronary artery without angina pectoris: Secondary | ICD-10-CM | POA: Insufficient documentation

## 2018-02-06 DIAGNOSIS — I130 Hypertensive heart and chronic kidney disease with heart failure and stage 1 through stage 4 chronic kidney disease, or unspecified chronic kidney disease: Secondary | ICD-10-CM | POA: Insufficient documentation

## 2018-02-06 DIAGNOSIS — Z7982 Long term (current) use of aspirin: Secondary | ICD-10-CM | POA: Insufficient documentation

## 2018-02-06 DIAGNOSIS — Z9221 Personal history of antineoplastic chemotherapy: Secondary | ICD-10-CM

## 2018-02-06 DIAGNOSIS — Z125 Encounter for screening for malignant neoplasm of prostate: Secondary | ICD-10-CM

## 2018-02-06 DIAGNOSIS — Z7901 Long term (current) use of anticoagulants: Secondary | ICD-10-CM | POA: Insufficient documentation

## 2018-02-06 DIAGNOSIS — Z95810 Presence of automatic (implantable) cardiac defibrillator: Secondary | ICD-10-CM

## 2018-02-06 DIAGNOSIS — Z79899 Other long term (current) drug therapy: Secondary | ICD-10-CM | POA: Insufficient documentation

## 2018-02-06 DIAGNOSIS — Z955 Presence of coronary angioplasty implant and graft: Secondary | ICD-10-CM | POA: Diagnosis not present

## 2018-02-06 DIAGNOSIS — D500 Iron deficiency anemia secondary to blood loss (chronic): Secondary | ICD-10-CM

## 2018-02-06 LAB — COMPREHENSIVE METABOLIC PANEL
ALT: 27 U/L (ref 0–44)
AST: 23 U/L (ref 15–41)
Albumin: 3.9 g/dL (ref 3.5–5.0)
Alkaline Phosphatase: 61 U/L (ref 38–126)
Anion gap: 9 (ref 5–15)
BUN: 24 mg/dL — ABNORMAL HIGH (ref 6–20)
CO2: 24 mmol/L (ref 22–32)
Calcium: 9.7 mg/dL (ref 8.9–10.3)
Chloride: 107 mmol/L (ref 98–111)
Creatinine, Ser: 1.18 mg/dL (ref 0.61–1.24)
GFR calc non Af Amer: >60 mL/min (ref >60)
Glucose, Bld: 99 mg/dL (ref 70–99)
Potassium: 4.9 mmol/L (ref 3.5–5.1)
Sodium: 140 mmol/L (ref 135–145)
Total Bilirubin: 0.5 mg/dL (ref 0.3–1.2)
Total Protein: 7.1 g/dL (ref 6.5–8.1)

## 2018-02-06 LAB — FERRITIN: Ferritin: 56 ng/mL (ref 24–336)

## 2018-02-06 LAB — CBC WITH DIFFERENTIAL/PLATELET
Abs Immature Granulocytes: 0.02 10*3/uL (ref 0.00–0.07)
Basophils Absolute: 0 10*3/uL (ref 0.0–0.1)
Basophils Relative: 0 %
Eosinophils Absolute: 0.2 10*3/uL (ref 0.0–0.5)
Eosinophils Relative: 3 %
HCT: 41.6 % (ref 39.0–52.0)
Hemoglobin: 13.7 g/dL (ref 13.0–17.0)
Immature Granulocytes: 0 %
Lymphocytes Relative: 28 %
Lymphs Abs: 1.9 10*3/uL (ref 0.7–4.0)
MCH: 30 pg (ref 26.0–34.0)
MCHC: 32.9 g/dL (ref 30.0–36.0)
MCV: 91.2 fL (ref 80.0–100.0)
Monocytes Absolute: 0.7 10*3/uL (ref 0.1–1.0)
Monocytes Relative: 10 %
Neutro Abs: 3.9 10*3/uL (ref 1.7–7.7)
Neutrophils Relative %: 59 %
Platelets: 131 10*3/uL — ABNORMAL LOW (ref 150–400)
RBC: 4.56 MIL/uL (ref 4.22–5.81)
RDW: 14.6 % (ref 11.5–15.5)
WBC: 6.7 10*3/uL (ref 4.0–10.5)
nRBC: 0 % (ref 0.0–0.2)

## 2018-02-06 LAB — CEA (IN HOUSE-CHCC): CEA (CHCC-IN HOUSE): 4.84 ng/mL (ref 0.00–5.00)

## 2018-02-06 LAB — IRON AND TIBC
Iron: 119 ug/dL (ref 42–163)
Saturation Ratios: 45 % (ref 20–55)
TIBC: 265 ug/dL (ref 202–409)
UIBC: 147 ug/dL (ref 117–376)

## 2018-02-06 NOTE — Telephone Encounter (Signed)
Printed avs and calender of upcoming appointment. Per 1/9 los 

## 2018-02-07 LAB — PROSTATE-SPECIFIC AG, SERUM (LABCORP): Prostate Specific Ag, Serum: 1.1 ng/mL (ref 0.0–4.0)

## 2018-02-09 DIAGNOSIS — Z79899 Other long term (current) drug therapy: Secondary | ICD-10-CM | POA: Insufficient documentation

## 2018-02-09 NOTE — Progress Notes (Signed)
Cardiology Office Note:    Date:  02/10/2018   ID:  Christopher [name], DOB Nov 11, 1960, MRN [id number]  PCP:  Patient, No Pcp Per  Cardiologist:  Shirlee More, MD    Referring MD: No ref. provider found    ASSESSMENT:    1. Coronary artery disease involving native coronary artery of native heart without angina pectoris   2. Cardiomyopathy, ischemic   3. Chronic systolic CHF (congestive heart failure) ([place])   4. Hyperlipidemia, unspecified hyperlipidemia type   5. AICD (automatic cardioverter/defibrillator) present   6. High risk medication use    PLAN:    In order of problems listed above:  1. Stable New York Heart Association class II has had angina doing activities like adding air to his tires and his exercise intolerance since his stop and pause when he does activities like walking a longer distance.  We discussed the benefits of ischemia evaluation he is at risk especially with his cancer and he declines at this time but did say that if he is to take nitroglycerin he will contact my office. 2. Continue current treatment I did ask him to transition from ACE inhibitor to [facility] he agrees wants to delay until after his next [employer] and understands that he needs to be off lisinopril for 3 days prior to taking Entresto to avoid allergic reaction.  I will see back in the office at a shorter interval 6 weeks regarding up titration 3. Compensated New York Heart Association class II with exercise intolerance currently not on a diuretic.  He has no fluid overload.  Check a BNP level.  He is on digoxin and I am just unsure whether or not it should be withdrawn.  As long as he is not toxic and levels are therapeutic I will continue motion at this time 4. Continue statin check lipid profile 5. Stable followed in our device clinic 6. Digoxin at risk for is not having labs digoxin level done I told him it needs to be performed every 6 months to avoid toxicity and goal is for level to  be less than 1 to avoid excess mortality with use of heart failure and atrial fibrillation   Next appointment: 6 weeks   Medication Adjustments/Labs and Tests Ordered: Current medicines are reviewed at length with the patient today.  Concerns regarding medicines are outlined above.  Orders Placed This Encounter  Procedures  . EKG 12-Lead   No orders of the defined types were placed in this encounter.   Chief Complaint  Patient presents with  . Follow-up  . Congestive Heart Failure  . Coronary Artery Disease    History of Present Illness:    Christopher [name] is a 58 y.o. male with a hx of CAD with PCI and drug-eluting stent to the LAD May 2015 and drug-eluting stent to the obtuse marginal November 2016 ischemic cardiomyopathy EF 60-73% systolic heart failure hyperlipidemia and an ICD  last seen 08/20/2017.  At that time his heart failure is compensated and no change in treatment occurred.. Compliance with diet, lifestyle and medications: Yes  Clinically appears to be stable and had an episode of angina putting air in his car tires that resolved with rest and if he locks longer distances and being on board he has to sit and rest.  No edema orthopnea shortness of breath with other activities no palpitations syncope or ICD discharge he is having no GI symptoms to suggest digoxin toxicity.  He requests 90-day prescriptions for  medications.  We discussed optimizing heart failure treatment and will transition from ACE inhibitor lisinopril with severely reduced ejection fraction Past Medical History:  Diagnosis Date  . Adenocarcinoma of descending colon  pT3, pN1a, pMX s/p colectomy/ostomy 05/18/2016 05/26/2016  . AICD (automatic cardioverter/defibrillator) present    09/07/13 Dr. Lovena Le post MI with EF 30% RV lead      Medtronic 6947 (serial number [id number]) Generator   Medtronic  Evera XT VR (serial Number [id number])   . Anemia   . Anemia due to blood loss 05/03/2016  . Anxiety   . Anxiety  state 01/17/2015  . Automatic implantable cardioverter-defibrillator in situ    MDT Aug 2015 Dr. Lovena Le  . Benign neoplasm of cecum   . CAD (coronary artery disease), native coronary artery 07/03/2013   Cath 06/20/13  Normal left main, occluded LAD, occluded RCA, 50% circ EF 15%  3.0 x28 and 3.0 x 8 mm Xience stent Dr. Tamala Julian  To LAD   . Cancer ([place])   . Cardiomyopathy, ischemic   . Chronic anticoagulation 06/12/2017  . Chronic kidney disease   . Chronic systolic CHF (congestive heart failure) ([facility])    ECHO 08/05/13  EF 30%  Anterior akinesis and inferior hypokinesis   . COPD (chronic obstructive pulmonary disease) ([place])   . Essential hypertension 05/16/2016  . Financial difficulties 10/01/2013  . History of colon cancer   . Hyperlipidemia   . Intra-abdominal abscess 06/12/2017  . Iron deficiency anemia due to chronic blood loss 07/02/2016  . Mass of colon 04/2016  . Myocardial infarction ([place]) 04/29/2013  . Old anterior myocardial infarction 06/20/2013  . Partial bowel obstruction ([place]) 05/16/2016  . S/P colostomy takedown 05/20/2017  . Thrombocytopenia ([place]) 12/27/2014   Chronic    . Tobacco use 07/03/2013    Past Surgical History:  Procedure Laterality Date  . CARDIAC CATHETERIZATION  05/2013   . CARDIAC CATHETERIZATION N/A 12/28/2014   Procedure: Left Heart Cath and Coronary Angiography;  Surgeon: [name], MD;  Location: [place] [facility];  Service: Cardiovascular;  Laterality: N/A;  . CARDIAC CATHETERIZATION N/A 12/28/2014   Procedure: Coronary Stent Intervention;  Surgeon: [name], MD;  Location: [place] [facility];  Service: Cardiovascular;  Laterality: N/A;  . COLECTOMY WITH COLOSTOMY CREATION/HARTMANN PROCEDURE Left 05/18/2016   Procedure: COLECTOMY WITH OSTOMY CREATION/HARTMANN PROCEDURE;  Surgeon: [name], MD;  Location: [place];  Service: General;  Laterality: Left;  . COLONOSCOPY WITH PROPOFOL N/A 04/01/2017   Procedure: COLONOSCOPY WITH PROPOFOL;  Surgeon: [name], MD;  Location: [facility];  Service: Gastroenterology;  Laterality: N/A;  . COLOSTOMY TAKEDOWN N/A 05/20/2017   Procedure: COLOSTOMY TAKEDOWN;  Surgeon: [name], MD;  Location: [place];  Service: General;  Laterality: N/A;  . CORONARY ANGIOPLASTY  05/2013   . FLEXIBLE SIGMOIDOSCOPY N/A 05/16/2016   Procedure: FLEXIBLE SIGMOIDOSCOPY;  Surgeon: [name], MD;  Location: [place];  Service: Gastroenterology;  Laterality: N/A;  . HAND SURGERY Right    cyst removal  . ICD placement  09/07/2013   . IMPLANTABLE CARDIOVERTER DEFIBRILLATOR IMPLANT N/A 09/07/2013   Procedure: IMPLANTABLE CARDIOVERTER DEFIBRILLATOR IMPLANT;  Surgeon: [name], MD;  Location: [facility] CATH LAB;  Service: Cardiovascular;  Laterality: N/A;  . INTRA-AORTIC BALLOON PUMP INSERTION  06/20/2013   Procedure: INTRA-AORTIC BALLOON PUMP INSERTION;  Surgeon: Sinclair Grooms, MD;  Location: [facility] C[facility] LAB;  Service: Cardiovascular;;  . LEFT HEART CATHETERIZATION WITH CORONARY ANGIOGRAM N/A 06/20/2013  Procedure: LEFT HEART CATHETERIZATION WITH CORONARY ANGIOGRAM;  Surgeon: Sinclair Grooms, MD;  Location[facility] C[facility] LAB;  Service: Cardiovascular;  Laterality: N/A;  . PERCUTANEOUS CORONARY STENT INTERVENTION (PCI-S)  06/20/2013   Procedure: PERCUTANEOUS CORONARY STENT INTERVENTION (PCI-S);  Surgeon: Sinclair Grooms, MD;  Location: [facility] CATH LAB;  Service: Cardiovascular;;    Current Medications: Current Meds  Medication Sig  . aspirin 81 MG chewable tablet Chew 1 tablet (81 mg total) by mouth daily.  Marland Kitchen atorvastatin (LIPITOR) 80 MG tablet Take 1 tablet (80 mg total) by mouth daily at 6 PM.  . carvedilol (COREG) 3.125 MG tablet Take 1 tablet (3.125 mg total) by mouth 2 (two) times daily with a meal.  . clopidogrel (PLAVIX) 75 MG tablet TAKE 1 TABLET BY MOUTH ONCE DAILY  . digoxin (LANOXIN) 0.125 MG tablet Take 1 tablet (0.125 mg total) by mouth daily.  Marland Kitchen lisinopril (PRINIVIL,ZESTRIL) 2.5 MG tablet Take 1 tablet  (2.5 mg total) by mouth daily.  . Multiple Vitamin (MULTIVITAMIN WITH MINERALS) TABS tablet Take 1 tablet by mouth daily.  Marland Kitchen saccharomyces boulardii (FLORASTOR) 250 MG capsule Take 1 capsule (250 mg total) by mouth 2 (two) times daily.     Allergies:   Capecitabine; Celebrex [celecoxib]; and Contrast media [iodinated diagnostic agents]   Social History   Socioeconomic History  . Marital status: Single    Spouse name: Not on file  . Number of children: 0  . Years of education: 62  . Highest education level: Not on file  Occupational History  . Occupation: Unemployed   Social Needs  . Financial resource strain: Not on file  . Food insecurity:    Worry: Not on file    Inability: Not on file  . Transportation needs:    Medical: Not on file    Non-medical: Not on file  Tobacco Use  . Smoking status: Former Smoker    Packs/day: 2.00    Years: 40.00    Pack years: 80.00    Types: Cigarettes    Last attempt to quit: 12/22/2014    Years since quitting: 3.1  . Smokeless tobacco: Never Used  Substance and Sexual Activity  . Alcohol use: No    Comment: quit in 08/2013, he used to drink alcohol moderate (1 pine liquor one week)  for 30 years   . Drug use: No  . Sexual activity: Not Currently    Birth control/protection: Condom    Comment: men   Lifestyle  . Physical activity:    Days per week: Not on file    Minutes per session: Not on file  . Stress: Not on file  Relationships  . Social connections:    Talks on phone: Not on file    Gets together: Not on file    Attends religious service: Not on file    Active member of club or organization: Not on file    Attends meetings of clubs or organizations: Not on file    Relationship status: Not on file  Other Topics Concern  . Not on file  Social History Narrative   Lives alone.    In a house trying to sell his home. Exercise: No.     Family History: The patient's family history includes Alzheimer's disease in his mother;  CVA in his mother; Dementia in his mother; Diabetes in his mother; Emphysema in his father; Heart disease in his mother; Lung cancer in his maternal grandmother; Pancreatic cancer in his maternal grandmother. There  is no history of Cancer. ROS:   Please see the history of present illness.    All other systems reviewed and are negative.  EKGs/Labs/Other Studies Reviewed:    The following studies were reviewed today:  EKG:  EKG ordered today.  The ekg ordered today demonstrates [facility] old inferior and anterior MI  Recent Labs: 02/06/2018: ALT 27; BUN 24; Creatinine, Ser 1.18; Hemoglobin 13.7; Platelets 131; Potassium 4.9; Sodium 140  Recent Lipid Panel    Component Value Date/Time   CHOL 120 12/23/2014 0331   TRIG 63 12/23/2014 0331   HDL 31 (L) 12/23/2014 0331   CHOLHDL 3.9 12/23/2014 0331   VLDL 13 12/23/2014 0331   LDLCALC 76 12/23/2014 0331    Physical Exam:    VS:  BP 114/80 (BP Location: Right Arm, Patient Position: Sitting, Cuff Size: Normal)   Pulse 80   Ht 5' 7.5" (1.715 m)   Wt 176 lb 12.8 oz (80.2 kg)   SpO2 97%   BMI 27.28 kg/m     Wt Readings from Last 3 Encounters:  02/10/18 176 lb 12.8 oz (80.2 kg)  02/06/18 177 lb 9.6 oz (80.6 kg)  10/31/17 169 lb 14.4 oz (77.1 kg)     GEN:  Well nourished, well developed in no acute distress HEENT: Normal NECK: No JVD; No carotid bruits LYMPHATICS: No lymphadenopathy CARDIAC: RRR, no murmurs, rubs, gallops RESPIRATORY:  Clear to auscultation without rales, wheezing or rhonchi  ABDOMEN: Soft, non-tender, non-distended MUSCULOSKELETAL:  No edema; No deformity  SKIN: Warm and dry NEUROLOGIC:  Alert and oriented x 3 PSYCHIATRIC:  Normal affect    Signed, Shirlee More, MD  02/10/2018 8:26 AM    [place]

## 2018-02-10 ENCOUNTER — Encounter: Payer: Self-pay | Admitting: Cardiology

## 2018-02-10 ENCOUNTER — Telehealth: Payer: Self-pay | Admitting: Cardiology

## 2018-02-10 ENCOUNTER — Ambulatory Visit (INDEPENDENT_AMBULATORY_CARE_PROVIDER_SITE_OTHER): Payer: Medicare Other | Admitting: Cardiology

## 2018-02-10 VITALS — BP 114/80 | HR 80 | Ht 67.5 in | Wt 176.8 lb

## 2018-02-10 DIAGNOSIS — Z95810 Presence of automatic (implantable) cardiac defibrillator: Secondary | ICD-10-CM | POA: Diagnosis not present

## 2018-02-10 DIAGNOSIS — Z79899 Other long term (current) drug therapy: Secondary | ICD-10-CM | POA: Diagnosis not present

## 2018-02-10 DIAGNOSIS — I2510 Atherosclerotic heart disease of native coronary artery without angina pectoris: Secondary | ICD-10-CM | POA: Diagnosis not present

## 2018-02-10 DIAGNOSIS — I255 Ischemic cardiomyopathy: Secondary | ICD-10-CM | POA: Diagnosis not present

## 2018-02-10 DIAGNOSIS — I5022 Chronic systolic (congestive) heart failure: Secondary | ICD-10-CM | POA: Diagnosis not present

## 2018-02-10 DIAGNOSIS — E785 Hyperlipidemia, unspecified: Secondary | ICD-10-CM | POA: Diagnosis not present

## 2018-02-10 MED ORDER — SACUBITRIL-VALSARTAN 24-26 MG PO TABS: 1.0000 | ORAL_TABLET | Freq: Two times a day (BID) | ORAL | 1 refills | Status: DC

## 2018-02-10 MED ORDER — CLOPIDOGREL BISULFATE 75 MG PO TABS: 75.0000 mg | ORAL_TABLET | Freq: Every day | ORAL | 1 refills | Status: DC

## 2018-02-10 MED ORDER — NITROGLYCERIN 0.4 MG SL SUBL: 0.4000 mg | SUBLINGUAL_TABLET | SUBLINGUAL | 11 refills | Status: DC | PRN

## 2018-02-10 NOTE — Telephone Encounter (Signed)
Patient cannot afford Entresto after the free initial month. He wants to stay on lisinipril.

## 2018-02-10 NOTE — Patient Instructions (Addendum)
Medication Instructions:  Your physician has recommended you make the following change in your medication:  DISCONTINUE:  Lisinopril for 3 days then START:  Entresto 24/26mg  one tablet twice daily.  PICK UP: Rx for Nitroglycerin 0.4 mg one tablet sublingual (under the tongue) as needed for chest pain.  When having chest pain, stop what you are doing and sit down. Take 1 nitro, wait 5 minutes. Still having chest pain, take 1 nitro, wait 5 minutes. Still having chest pain, take 1 nitro, dial 911. Total of 3 nitro in 15 minutes.   If you need a refill on your cardiac medications before your next appointment, please call your pharmacy.   Lab work: You will have lab work today:   Digoxin, ProBNP, Lipid If you have labs (blood work) drawn today and your tests are completely normal, you will receive your results only by: Marland Kitchen MyChart Message (if you have MyChart) OR . A paper copy in the mail If you have any lab test that is abnormal or we need to change your treatment, we will call you to review the results.  Testing/Procedures: You had an EKG today  Follow-Up: At [facility], you and your health needs are our priority.  As part of our continuing mission to provide you with exceptional heart care, we have created designated Provider Care Teams.  These Care Teams include your primary Cardiologist (physician) and Advanced Practice Providers (APPs -  Physician Assistants and Nurse Practitioners) who all work together to provide you with the care you need, when you need it. You will need a follow up appointment in 6 months.     Heart Failure  Weigh yourself every morning when you first wake up and record on a calender or note pad, bring this to your office visits. Using a pill tender can help with taking your medications consistently.  Limit your fluid intake to 2 liters daily  Limit your sodium intake to less than 2-3 grams daily. Ask if you need dietary teaching.  If you gain more than 3  pounds (from your dry weight ), double your dose of diuretic for the day.  If you gain more than 5 pounds (from your dry weight), double your dose of lasix and call your heart failure doctor.  Please do not smoke tobacco since it is very bad for your heart.  Please do not drink alcohol since it can worsen your heart failure.Also avoid OTC nonsteroidal drugs, such as advil, aleve and motrin.  Try to exercise for at least 30 minutes every day because this will help your heart be more efficient. You may be eligible for supervised cardiac rehab, ask your physician.   Nitroglycerin sublingual tablets What is this medicine? NITROGLYCERIN (nye troe GLI ser in) is a type of vasodilator. It relaxes blood vessels, increasing the blood and oxygen supply to your heart. This medicine is used to relieve chest pain caused by angina. It is also used to prevent chest pain before activities like climbing stairs, going outdoors in cold weather, or sexual activity. This medicine may be used for other purposes; ask your health care provider or pharmacist if you have questions. COMMON BRAND NAME(S): Nitroquick, Nitrostat, Nitrotab What should I tell my health care provider before I take this medicine? They need to know if you have any of these conditions: -anemia -head injury, recent stroke, or bleeding in the brain -liver disease -previous heart attack -an unusual or allergic reaction to nitroglycerin, other medicines, foods, dyes, or preservatives -pregnant  or trying to get pregnant -breast-feeding How should I use this medicine? Take this medicine by mouth as needed. At the first sign of an angina attack (chest pain or tightness) place one tablet under your tongue. You can also take this medicine 5 to 10 minutes before an event likely to produce chest pain. Follow the directions on the prescription label. Let the tablet dissolve under the tongue. Do not swallow whole. Replace the dose if you accidentally  swallow it. It will help if your mouth is not dry. Saliva around the tablet will help it to dissolve more quickly. Do not eat or drink, smoke or chew tobacco while a tablet is dissolving. If you are not better within 5 minutes after taking ONE dose of nitroglycerin, call 9-1-1 immediately to seek emergency medical care. Do not take more than 3 nitroglycerin tablets over 15 minutes. If you take this medicine often to relieve symptoms of angina, your doctor or health care professional may provide you with different instructions to manage your symptoms. If symptoms do not go away after following these instructions, it is important to call 9-1-1 immediately. Do not take more than 3 nitroglycerin tablets over 15 minutes. Talk to your pediatrician regarding the use of this medicine in children. Special care may be needed. Overdosage: If you think you have taken too much of this medicine contact a poison control center or emergency room at once. NOTE: This medicine is only for you. Do not share this medicine with others. What if I miss a dose? This does not apply. This medicine is only used as needed. What may interact with this medicine? Do not take this medicine with any of the following medications: -certain migraine medicines like ergotamine and dihydroergotamine (DHE) -medicines used to treat erectile dysfunction like sildenafil, tadalafil, and vardenafil -riociguat This medicine may also interact with the following medications: -alteplase -aspirin -heparin -medicines for high blood pressure -medicines for mental depression -other medicines used to treat angina -phenothiazines like chlorpromazine, mesoridazine, prochlorperazine, thioridazine This list may not describe all possible interactions. Give your health care provider a list of all the medicines, herbs, non-prescription drugs, or dietary supplements you use. Also tell them if you smoke, drink alcohol, or use illegal drugs. Some items may  interact with your medicine. What should I watch for while using this medicine? Tell your doctor or health care professional if you feel your medicine is no longer working. Keep this medicine with you at all times. Sit or lie down when you take your medicine to prevent falling if you feel dizzy or faint after using it. Try to remain calm. This will help you to feel better faster. If you feel dizzy, take several deep breaths and lie down with your feet propped up, or bend forward with your head resting between your knees. You may get drowsy or dizzy. Do not drive, use machinery, or do anything that needs mental alertness until you know how this drug affects you. Do not stand or sit up quickly, especially if you are an older patient. This reduces the risk of dizzy or fainting spells. Alcohol can make you more drowsy and dizzy. Avoid alcoholic drinks. Do not treat yourself for coughs, colds, or pain while you are taking this medicine without asking your doctor or health care professional for advice. Some ingredients may increase your blood pressure. What side effects may I notice from receiving this medicine? Side effects that you should report to your doctor or health care professional as soon  as possible: -blurred vision -dry mouth -skin rash -sweating -the feeling of extreme pressure in the head -unusually weak or tired Side effects that usually do not require medical attention (report to your doctor or health care professional if they continue or are bothersome): -flushing of the face or neck -headache -irregular heartbeat, palpitations -nausea, vomiting This list may not describe all possible side effects. Call your doctor for medical advice about side effects. You may report side effects to FDA at 1-800-FDA-1088. Where should I keep my medicine? Keep out of the reach of children. Store at room temperature between 20 and 25 degrees C (68 and 77 degrees F). Store in [facility] [employer]. Protect  from light and moisture. Keep tightly closed. Throw away any unused medicine after the expiration date. NOTE: This sheet is a summary. It may not cover all possible information. If you have questions about this medicine, talk to your doctor, pharmacist, or health care provider.  2019 Elsevier/Gold Standard (2012-11-13 17:57:36)   Sacubitril; Valsartan oral tablet What is this medicine? SACUBITRIL; VALSARTAN (sak UE bi tril; val SAR tan) is a combination of 2 drugs used to reduce the risk of death and hospitalizations in people with long-lasting heart failure. It is usually used with other medicines to treat heart failure. This medicine may be used for other purposes; ask your health care provider or pharmacist if you have questions. COMMON BRAND NAME(S): Entresto What should I tell my health care provider before I take this medicine? They need to know if you have any of these conditions: -diabetes and take a medicine that contains aliskiren -kidney disease -liver disease -an unusual or allergic reaction to sacubitril; valsartan, drugs called angiotensin converting enzyme (ACE) inhibitors, angiotensin II receptor blockers (ARBs), other medicines, foods, dyes, or preservatives -pregnant or trying to get pregnant -breast-feeding How should I use this medicine? Take this medicine by mouth with a glass of water. Follow the directions on the prescription label. You can take it with or without food. If it upsets your stomach, take it with food. Take your medicine at regular intervals. Do not take it more often than directed. Do not stop taking except on your doctor's advice. Do not take this medicine for at least 36 hours before or after you take an ACE inhibitor medicine. Talk to your health care provider if you are not sure if you take an ACE inhibitor. Talk to your pediatrician regarding the use of this medicine in children. Special care may be needed. Overdosage: If you think you have taken too  much of this medicine contact a poison control center or emergency room at once. NOTE: This medicine is only for you. Do not share this medicine with others. What if I miss a dose? If you miss a dose, take it as soon as you can. If it is almost time for next dose, take only that dose. Do not take double or extra doses. What may interact with this medicine? Do not take this medicine with any of the following medicines: -aliskiren if you have diabetes -angiotensin-converting enzyme (ACE) inhibitors, like benazepril, captopril, enalapril, fosinopril, lisinopril, or ramipril This medicine may also interact with the following medicines: -angiotensin II receptor blockers (ARBs) like azilsartan, candesartan, eprosartan, irbesartan, losartan, olmesartan, telmisartan, or valsartan -lithium -NSAIDS, medicines for pain and inflammation, like ibuprofen or naproxen -potassium-sparing diuretics like amiloride, spironolactone, and triamterene -potassium supplements This list may not describe all possible interactions. Give your health care provider a list of all the medicines, herbs, non-prescription  drugs, or dietary supplements you use. Also tell them if you smoke, drink alcohol, or use illegal drugs. Some items may interact with your medicine. What should I watch for while using this medicine? Tell your doctor or healthcare professional if your symptoms do not start to get better or if they get worse. Do not become pregnant while taking this medicine. Women should inform their doctor if they wish to become pregnant or think they might be pregnant. There is a potential for serious side effects to an unborn child. Talk to your health care professional or pharmacist for more information. You may get dizzy. Do not drive, use machinery, or do anything that needs mental alertness until you know how this medicine affects you. Do not stand or sit up quickly, especially if you are an older patient. This reduces the  risk of dizzy or fainting spells. Avoid alcoholic drinks; they can make you more dizzy. What side effects may I notice from receiving this medicine? Side effects that you should report to your doctor or health care professional as soon as possible: -allergic reactions like skin rash, itching or hives, swelling of the face, lips, or tongue -signs and symptoms of increased potassium like muscle weakness; chest pain; or fast, irregular heartbeat -signs and symptoms of kidney injury like trouble passing urine or change in the amount of urine -signs and symptoms of low blood pressure like feeling dizzy or lightheaded, or if you develop extreme fatigue Side effects that usually do not require medical attention (report to your doctor or health care professional if they continue or are bothersome): -cough This list may not describe all possible side effects. Call your doctor for medical advice about side effects. You may report side effects to FDA at 1-800-FDA-1088. Where should I keep my medicine? Keep out of the reach of children. Store at room temperature between 15 and 30 degrees C (59 and 86 degrees F). Throw away any unused medicine after the expiration date. NOTE: This sheet is a summary. It may not cover all possible information. If you have questions about this medicine, talk to your doctor, pharmacist, or health care provider.  2019 Elsevier/Gold Standard (2015-03-02 13:54:19)

## 2018-02-10 NOTE — Telephone Encounter (Signed)
Attempted to contact pharmacy twice with no answer. Spoke with patient who states entresto will cost him $150/month after he picks up the free 30 day supply from [employer]. Advised patient that we could do a prior authorization or patient assistance to try to lower the cost. Patient was adamant that he will not get approved for assistance. Patient reports that he is going to call [employer] to see how much entresto would cost coming from them and let me know what he wants to do from there.

## 2018-02-11 ENCOUNTER — Telehealth: Payer: Self-pay

## 2018-02-11 LAB — LIPID PANEL
Chol/HDL Ratio: 3.6 ratio (ref 0.0–5.0)
Cholesterol, Total: 128 mg/dL (ref 100–199)
HDL: 36 mg/dL — ABNORMAL LOW (ref >39)
LDL Calculated: 63 mg/dL (ref 0–99)
Triglycerides: 146 mg/dL (ref 0–149)
VLDL Cholesterol Cal: 29 mg/dL (ref 5–40)

## 2018-02-11 LAB — PRO B NATRIURETIC PEPTIDE: NT-Pro BNP: 779 pg/mL — ABNORMAL HIGH (ref 0–210)

## 2018-02-11 LAB — DIGOXIN LEVEL: DIGOXIN, SERUM: 0.5 ng/mL (ref 0.5–0.9)

## 2018-02-11 MED ORDER — LISINOPRIL 2.5 MG PO TABS: 2.5000 mg | ORAL_TABLET | Freq: Every day | ORAL | 2 refills | Status: DC

## 2018-02-11 NOTE — Telephone Encounter (Signed)
-----   Message from [name], MD sent at 02/09/2018  3:54 PM EST ----- Please let pt know the lab results, screening PSA normal, CEA, iron level and CMP are WNL, no concerns, thanks   [name]  02/09/2018

## 2018-02-11 NOTE — Telephone Encounter (Signed)
Patient returned call and states that he is not going to be able to afford Entresto.  He prefers to continue Lisinopril.  Dr Bettina Gavia is aware that patient can not afford Entresto and he will continue Lisinopril.  Patient had multiple questions regarding his lab work and medications.  Questions were answered to the best of my ability and patient was satisfied with answers.   Patient will not be seen in 6 weeks, instead a recall was entered for 6 months.  Patient agreed to plan and verbalized understanding.

## 2018-02-11 NOTE — Addendum Note (Signed)
Addended by: Stevan Born on: 02/11/2018 09:50 AM   Modules accepted: Orders

## 2018-02-11 NOTE — Telephone Encounter (Signed)
Left voice message for patient with lab results, Per Dr. Burr Medico, PSA was normal, CEA, iron level and CMP are all WNL, no concerns.

## 2018-02-11 NOTE — Telephone Encounter (Signed)
Patient states [employer] cannot help, it is still $300. Please advise patient on how we can refill script.

## 2018-02-14 LAB — CUP PACEART REMOTE DEVICE CHECK
Battery Remaining Longevity: 91 mo
Battery Voltage: 2.99 V
Brady Statistic RV Percent Paced: 0 %
Date Time Interrogation Session: 20191127062306
HighPow Impedance: 62 Ohm
Implantable Lead Implant Date: 20150810
Implantable Lead Model: 6935
Implantable Pulse Generator Implant Date: 20150810
Lead Channel Impedance Value: 342 Ohm
Lead Channel Impedance Value: 456 Ohm
Lead Channel Pacing Threshold Amplitude: 0.5 V
Lead Channel Pacing Threshold Pulse Width: 0.4 ms
Lead Channel Sensing Intrinsic Amplitude: 4.75 mV
Lead Channel Sensing Intrinsic Amplitude: 4.75 mV
Lead Channel Setting Pacing Amplitude: 2 V
Lead Channel Setting Sensing Sensitivity: 0.3 mV
MDC IDC LEAD LOCATION: 753860
MDC IDC SET LEADCHNL RV PACING PULSEWIDTH: 0.4 ms

## 2018-02-19 ENCOUNTER — Telehealth: Payer: Self-pay | Admitting: Cardiology

## 2018-02-19 ENCOUNTER — Other Ambulatory Visit: Payer: Self-pay | Admitting: Cardiology

## 2018-02-19 ENCOUNTER — Other Ambulatory Visit: Payer: Self-pay

## 2018-02-19 MED ORDER — ATORVASTATIN CALCIUM 80 MG PO TABS: 80.0000 mg | ORAL_TABLET | Freq: Every day | ORAL | 2 refills | Status: DC

## 2018-02-19 NOTE — Telephone Encounter (Signed)
°  1. Which medications need to be refilled? (please list name of each medication and dose if known) Atorvastatin 80mg  tablet one daily  2. Which pharmacy/location (including street and city if local pharmacy) is medication to be sent to?[id number]  3. Do they need a 30 day or 90 day supply? [place]

## 2018-02-20 ENCOUNTER — Telehealth: Payer: Self-pay | Admitting: Cardiology

## 2018-02-20 MED ORDER — ATORVASTATIN CALCIUM 80 MG PO TABS: 80.0000 mg | ORAL_TABLET | Freq: Every day | ORAL | 0 refills | Status: DC

## 2018-02-20 NOTE — Telephone Encounter (Signed)
°*  STAT* If patient is at the pharmacy, call can be transferred to refill team.   1. Which medications need to be refilled? (please list name of each medication and dose if known) Atorvastatin 80mg   2. Which pharmacy/location (including street and city if local pharmacy) is medication to be sent to?[facility] [address]  3. Do they need a 30 day or 90 day supply? [place]

## 2018-02-20 NOTE — Telephone Encounter (Signed)
Medication was refilled by Julianne Rice, CMA

## 2018-02-20 NOTE — Telephone Encounter (Signed)
Medication was refilled yesterday.

## 2018-02-20 NOTE — Telephone Encounter (Signed)
Atorvastatin 80 mg daily refilled

## 2018-02-26 ENCOUNTER — Ambulatory Visit (INDEPENDENT_AMBULATORY_CARE_PROVIDER_SITE_OTHER): Payer: Medicare Other | Admitting: Gastroenterology

## 2018-02-26 ENCOUNTER — Encounter: Payer: Self-pay | Admitting: Gastroenterology

## 2018-02-26 VITALS — BP 90/60 | HR 84 | Ht 67.5 in | Wt 177.5 lb

## 2018-02-26 DIAGNOSIS — Z85038 Personal history of other malignant neoplasm of large intestine: Secondary | ICD-10-CM

## 2018-02-26 DIAGNOSIS — Z7902 Long term (current) use of antithrombotics/antiplatelets: Secondary | ICD-10-CM | POA: Diagnosis not present

## 2018-02-26 DIAGNOSIS — I255 Ischemic cardiomyopathy: Secondary | ICD-10-CM | POA: Diagnosis not present

## 2018-02-26 NOTE — Progress Notes (Signed)
[facility] Progress Note  Chief Complaint: Personal history of colon cancer  Subjective  History:  K[place] had an obstructing left colon tumor in 2018 requiring resection and colostomy.  Colonoscopy March 2019 through ostomy revealed 5 mm cecal tubular adenoma and normal rectum.  Colostomy takedown performed by Dr. [name] April 2019. ROS: Cardiovascular: Angina (see below) Respiratory: no dyspnea or cough Anxiety Lately has some left heel pain Remainder of systems negative except as above  The patient's Past Medical, Family and Social History were reviewed and are on file in the EMR.  From January 13 clinic note by his cardiologist, Dr. Shirlee More: 1. "Stable New York Heart Association class II has had angina doing activities like adding air to his tires and his exercise intolerance since his stop and pause when he does activities like walking a longer distance.  We discussed the benefits of ischemia evaluation he is at risk especially with his cancer and he declines at this time but did say that if he is to take nitroglycerin he will contact my office. 2. Continue current treatment I did ask him to transition from ACE inhibitor to [facility] he agrees wants to delay until after his next [employer] and understands that he needs to be off lisinopril for 3 days prior to taking Entresto to avoid allergic reaction.  I will see back in the office at a shorter interval 6 weeks regarding up titration 3. Compensated New York Heart Association class II with exercise intolerance currently not on a diuretic.  He has no fluid overload.  Check a BNP level.  He is on digoxin and I am just unsure whether or not it should be withdrawn.  As long as he is not toxic and levels are therapeutic I will continue motion at this time 4. Continue statin check lipid profile 5. Stable followed in our device clinic 6. Digoxin at risk for is not having labs digoxin level done I told him it  needs to be performed every 6 months to avoid toxicity and goal is for level to be less than 1 to avoid excess mortality with use of heart failure and atrial fibrillation   Next appointment: 6 weeks"  Theador is a somewhat limited and tangential historian as before.  He has been having exertional angina that is more frequent than before.  He says his cardiologist (who has assumed his care while his regular cardiologist is on leave) prescribed Entresto, which he cannot afford, and how believes he is now not scheduled for a follow-up exam office visit for 6 months.  His bowel habits are regular without rectal bleeding.  His appetite is good and he has put on weight.  Objective:  Med list reviewed  Current Outpatient Medications:  .  aspirin 81 MG chewable tablet, Chew 1 tablet (81 mg total) by mouth daily., Disp: 30 tablet, Rfl: 5 .  atorvastatin (LIPITOR) 80 MG tablet, Take 1 tablet (80 mg total) by mouth daily at 6 PM., Disp: 90 tablet, Rfl: 0 .  carvedilol (COREG) 3.125 MG tablet, TAKE 1 TABLET BY MOUTH TWICE DAILY WITH MEALS, Disp: 180 tablet, Rfl: 1 .  clopidogrel (PLAVIX) 75 MG tablet, Take 1 tablet (75 mg total) by mouth daily., Disp: 90 tablet, Rfl: 1 .  digoxin (LANOXIN) 0.125 MG tablet, Take 1 tablet (0.125 mg total) by mouth daily., Disp: 30 tablet, Rfl: 0 .  lisinopril (PRINIVIL,ZESTRIL) 2.5 MG tablet, Take 1 tablet (2.5 mg total) by mouth daily.,  Disp: 90 tablet, Rfl: 2 .  Multiple Vitamin (MULTIVITAMIN WITH MINERALS) TABS tablet, Take 1 tablet by mouth daily., Disp: 30 tablet, Rfl: 0 .  nitroGLYCERIN (NITROSTAT) 0.4 MG SL tablet, Place 1 tablet (0.4 mg total) under the tongue every 5 (five) minutes as needed for chest pain., Disp: 25 tablet, Rfl: 11 .  saccharomyces boulardii (FLORASTOR) 250 MG capsule, Take 1 capsule (250 mg total) by mouth 2 (two) times daily., Disp: 30 capsule, Rfl: 0   Vital signs in last 24 hrs: Vitals:   02/26/18 1315  BP: 90/60  Pulse: 84    Physical  Exam  He is well-appearing, has put on weight since I last saw him  HEENT: sclera anicteric, oral mucosa moist without lesions  Neck: supple, no thyromegaly, JVD or lymphadenopathy  Cardiac: RRR without murmurs, S1S2 heard, no peripheral edema  Pulm: clear to auscultation bilaterally, normal RR and effort noted  Abdomen: soft, no tenderness, with active bowel sounds. No guarding or palpable hepatosplenomegaly.  Long midline scar and LUQ ostomy scar  Skin; warm and dry, no jaundice or rash  Recent Labs:  BNP over 700 Last CEA 2.8 in April 2018  Radiologic studies:  CT abdomen and pelvis on 06/12/2017 shows fluid collection with air consistent with anastomotic leak.  @ASSESSMENTPLANBEGIN @ Assessment: Encounter Diagnoses  Name Primary?  . History of colon cancer Yes  . Cardiomyopathy, ischemic   . Antiplatelet or antithrombotic long-term use    Personal history of colon cancer, adenomatous polyp March 2019.  I had planned a recall at about a 1 year interval, but it is not clear if now is the optimal time.  He seems to have ongoing cardiac issues and the plan is uncertain.  While he was having angina last year when colonoscopy and surgery were done, it sounds as if it is more frequent lately.  I will message his current cardiologist and ask for their risk assessment, particularly whether or not they are planning to do stress testing and/or cardiac intervention. We will move Prentice's possibly recall back several months.   Total time 30 minutes, over half spent face-to-face with patient in counseling and coordination of care.   [name]

## 2018-02-26 NOTE — Patient Instructions (Signed)
If you are age 56 or older, your body mass index should be between 23-30. Your Body mass index is 27.39 kg/m. If this is out of the aforementioned range listed, please consider follow up with your Primary Care Provider.  If you are age 64 or younger, your body mass index should be between 19-25. Your Body mass index is 27.39 kg/m. If this is out of the aformentioned range listed, please consider follow up with your Primary Care Provider.   It was a pleasure to see you today!   Dr. Danis  

## 2018-02-28 ENCOUNTER — Telehealth: Payer: Self-pay | Admitting: Cardiology

## 2018-02-28 NOTE — Telephone Encounter (Signed)
New Message    Received an message from the answering service in reference to patient. The call was on 02/27/2018 at 8:07pm and 9:09pm. Patient states "his doctor is lying on him, that is chart is mis-worded and he needs to get his colonoscopy". Please call to discuss.

## 2018-02-28 NOTE — Telephone Encounter (Signed)
Patient called our office first thing this morning requesting to have his follow up time corrected from his visit on 02/10/2018. Dr. Bettina Gavia originally advised for patient to follow up in 6 weeks and then was agreeable to a 6 month follow up per patient's request. This information was never updated in Epic. His follow up time is now corrected and patient has been mailed an updated copy of his after visit summary.

## 2018-03-04 ENCOUNTER — Telehealth: Payer: Self-pay | Admitting: Cardiology

## 2018-03-04 NOTE — Telephone Encounter (Signed)
OK with me.

## 2018-03-04 NOTE — Telephone Encounter (Signed)
New Message   Patient wants to stop seeing Dr. Bettina Gavia and start coming to [facility] to see Dr. Angelena Form.  Patient needs consent to do so and chart faxed over if both doctors approve.

## 2018-03-05 ENCOUNTER — Other Ambulatory Visit: Payer: Self-pay | Admitting: Internal Medicine

## 2018-03-05 NOTE — Telephone Encounter (Signed)
Ok with me 

## 2018-03-06 ENCOUNTER — Telehealth: Payer: Self-pay

## 2018-03-06 ENCOUNTER — Telehealth: Payer: Self-pay | Admitting: *Deleted

## 2018-03-06 ENCOUNTER — Telehealth: Payer: Self-pay | Admitting: Gastroenterology

## 2018-03-06 NOTE — Telephone Encounter (Signed)
Just an fyi, pt called stating that he wanted to know if Dr. Loletha Carrow was able to communicate with his cardiologist. He complaint about not being able to schedule his colonoscopy due to his heart issues, in his opinion he is fine to undergo procedure so does not understand why we refuse to do it. Pt became very upset as the conversation continue, he stated that he is no longer seeing his cardiologist and is looking for a new one. He stated that we do not care about his health and hung up on me.

## 2018-03-06 NOTE — Telephone Encounter (Signed)
Pt will need to be cleared by Dr. Angelena Form for colonoscopy.  I spoke with pt and scheduled him to see Dr. Angelena Form tomorrow (03/07/18) at 9:00.  Pt thinks his colonoscopy will be in March sometime. I spoke with [facility] and they will put clearance request in a note.

## 2018-03-06 NOTE — Telephone Encounter (Signed)
Please see note below from Dr. Loletha Carrow:   Loletha Carrow, Kirke Corin, MD  Richardo Priest, MD; Algernon Huxley, RN        Dr. Bettina Gavia,   My recent office note on our mutual patient is attached.   It was difficult to get a clear history from [place], but it sounds like he has been having more frequent angina in the last few months. Please let me know if you feel he should have a stress test before a colonoscopy.  If not, please tell me if you feel it is acceptable risk for him to remain on aspirin but be off plavix 5 days prior to a colonoscopy.  Thanks,   - [facility]

## 2018-03-06 NOTE — Telephone Encounter (Signed)
Spoke with Rose in chart prep.  She spoke with Christopher [name] this AM. Christopher [name] was requesting appointment with Dr. Angelena Form to be cleared for colonoscopy.  Christopher [name] last saw Dr. Bettina Gavia on 02/28/18 with 6 month follow up planned per 02/28/18 phone note.  Christopher [name] will be changing to see Dr. Angelena Form.   I do not see clearance request note in chart for colonoscopy.   Will forward to Dr. Bettina Gavia to see if Christopher [name] can be cleared for colonoscopy.

## 2018-03-06 NOTE — Telephone Encounter (Signed)
I have sent a message to his cardiologist attached to my office note from last week asking if stress test needed and if Leevi can be off plavix 5 days prior to a colonoscopy.  I copied you on that note as well.  I am covering the hospital consult service this week, which has decreased my available time to attend to routine outpatient issues.  Thanks   - HD

## 2018-03-06 NOTE — Telephone Encounter (Signed)
-----   Message from [name], MD sent at 03/06/2018 10:18 AM EST ----- Dr. Bettina Gavia,    My recent office note on our mutual patient is attached.   It was difficult to get a clear history from [place], but it sounds like he has been having more frequent angina in the last few months.  Please let me know if you feel he should have a stress test before a colonoscopy.   If not, please tell me if you feel it is acceptable risk for him to remain on aspirin but be off plavix 5 days prior to a colonoscopy. Thanks,  - [facility]

## 2018-03-06 NOTE — Progress Notes (Signed)
Chief Complaint  Patient presents with  . Follow-up    Ischemic cardiomyopathy   History of Present Illness:58 yo male with history of anxiety, CAD with prior stenting of the LAD and obtuse marginal, ischemic cardiomyopathy s/p ICD, chronic kidney disease, COPD, chronic systolic CHF, HTN, hyperlipidemia, former tobacco abuse, iron deficiency anemia, colon cancer s/p colectomy here today to establish care in the [facility] cardiology office. He has been followed in the past by Dr. Wynonia Lawman. Given Dr. Thurman Coyer recent absence due to illness, Mr. Kunath was seen in our [facility] Cardiology office by Dr. Bettina Gavia 02/10/18 to establish care there. He has a complex past medical history. He has undergone a colectomy for colon cancer. He was admitted to [facility] May 2015 with a late presenting anterior STEMI. He was found to have chronic total occlusion of the mid RCA and a totally occluded proximal to mid LAD. There was moderate disease in the Circumflex artery. LVEF was 15-20%. Overlapping drug eluting stents were placed in the mid LAD. A balloon pump was placed in the cath lab. ICD implanted 09/07/13. His ICD has been followed in our EP clinic by Dr. Lovena Le. Cardiac cath November 2016 with widely patent stent in the LAD. Chronically occluded RCA with filling from left to right collaterals. There was a severe stenosis in the obtuse marginal branch treated with a drug eluting stent. Echo April 2018 with LVEF=25-30%. No significant valve disease. When he was seen by Dr. Bettina Gavia on 02/10/18 he complained of dyspnea with exertion and isolated episodes of chest pain. He was felt to be stable. His Ace-inh was changed to [facility]. He was angry when he found out the cost of Entresto and refused to go back to that office to be seen.   He is added onto my schedule today for pre-operative risk assessment prior to planned colonoscopy. He tells me that he has been feeling well. Rare chest pains with heavy exertion. Only has dyspnea  with heavy exertion. This is all unchanged over the past two years. No lower extremity edema. No weight gain. He no longer smokes. He has been taking all medications as requested. Delene Loll was too expensive so he did not start this. He has been back on Lisinopril. He has a surveillance colonoscopy being planned in [place].   Primary Care Physician: Patient, No Pcp Per   Past Medical History:  Diagnosis Date  . Adenocarcinoma of descending colon  pT3, pN1a, pMX s/p colectomy/ostomy 05/18/2016 05/26/2016  . AICD (automatic cardioverter/defibrillator) present    09/07/13 Dr. Lovena Le post MI with EF 30% RV lead      Medtronic 6947 (serial number [id number]) Generator   Medtronic  Evera XT VR (serial Number [id number])   . Anemia   . Anemia due to blood loss 05/03/2016  . Anxiety   . Anxiety state 01/17/2015  . Automatic implantable cardioverter-defibrillator in situ    MDT Aug 2015 Dr. Lovena Le  . Benign neoplasm of cecum   . CAD (coronary artery disease), native coronary artery 07/03/2013   Cath 06/20/13  Normal left main, occluded LAD, occluded RCA, 50% circ EF 15%  3.0 x28 and 3.0 x 8 mm Xience stent Dr. Tamala Julian  To LAD   . Cancer ([place])   . Cardiomyopathy, ischemic   . Chronic anticoagulation 06/12/2017  . Chronic kidney disease   . Chronic systolic CHF (congestive heart failure) (HCC)    ECHO 08/05/13  EF 30%  Anterior akinesis and inferior hypokinesis   . COPD (chronic obstructive  pulmonary disease) ([place])   . Essential hypertension 05/16/2016  . Financial difficulties 10/01/2013  . History of colon cancer   . Hyperlipidemia   . Intra-abdominal abscess 06/12/2017  . Iron deficiency anemia due to chronic blood loss 07/02/2016  . Mass of colon 04/2016  . Myocardial infarction ([place]) 04/29/2013  . Old anterior myocardial infarction 06/20/2013  . Partial bowel obstruction ([place]) 05/16/2016  . S/P colostomy takedown 05/20/2017  . Thrombocytopenia ([place]) 12/27/2014   Chronic    . Tobacco use 07/03/2013     Past Surgical History:  Procedure Laterality Date  . CARDIAC CATHETERIZATION  05/2013   . CARDIAC CATHETERIZATION N/A 12/28/2014   Procedure: Left Heart Cath and Coronary Angiography;  Surgeon: [name], MD;  Location: [place] CV LAB;  Service: Cardiovascular;  Laterality: N/A;  . CARDIAC CATHETERIZATION N/A 12/28/2014   Procedure: Coronary Stent Intervention;  Surgeon: [name], MD;  Location: [place] CV LAB;  Service: Cardiovascular;  Laterality: N/A;  . COLECTOMY WITH COLOSTOMY CREATION/HARTMANN PROCEDURE Left 05/18/2016   Procedure: COLECTOMY WITH OSTOMY CREATION/HARTMANN PROCEDURE;  Surgeon: [name], MD;  Location: [place];  Service: General;  Laterality: Left;  . COLONOSCOPY WITH PROPOFOL N/A 04/01/2017   Procedure: COLONOSCOPY WITH PROPOFOL;  Surgeon: [name], MD;  Location: [facility];  Service: Gastroenterology;  Laterality: N/A;  . COLOSTOMY TAKEDOWN N/A 05/20/2017   Procedure: COLOSTOMY TAKEDOWN;  Surgeon: [name], MD;  Location: [place];  Service: General;  Laterality: N/A;  . CORONARY ANGIOPLASTY  05/2013   . FLEXIBLE SIGMOIDOSCOPY N/A 05/16/2016   Procedure: FLEXIBLE SIGMOIDOSCOPY;  Surgeon: [name], MD;  Location: [place];  Service: Gastroenterology;  Laterality: N/A;  . HAND SURGERY Right    cyst removal  . ICD placement  09/07/2013   . IMPLANTABLE CARDIOVERTER DEFIBRILLATOR IMPLANT N/A 09/07/2013   Procedure: IMPLANTABLE CARDIOVERTER DEFIBRILLATOR IMPLANT;  Surgeon: [name], MD;  Location: [facility] CATH LAB;  Service: Cardiovascular;  Laterality: N/A;  . INTRA-AORTIC BALLOON PUMP INSERTION  06/20/2013   Procedure: INTRA-AORTIC BALLOON PUMP INSERTION;  Surgeon: Sinclair Grooms, MD;  Location: [facility] CATH LAB;  Service: Cardiovascular;;  . LEFT HEART CATHETERIZATION WITH CORONARY ANGIOGRAM N/A 06/20/2013   Procedure: LEFT HEART CATHETERIZATION WITH CORONARY ANGIOGRAM;  Surgeon: Sinclair Grooms, MD;  Location: [facility] CATH LAB;   Service: Cardiovascular;  Laterality: N/A;  . PERCUTANEOUS CORONARY STENT INTERVENTION (PCI-S)  06/20/2013   Procedure: PERCUTANEOUS CORONARY STENT INTERVENTION (PCI-S);  Surgeon: Sinclair Grooms, MD;  Location: [facility] CATH LAB;  Service: Cardiovascular;;    Current Outpatient Medications  Medication Sig Dispense Refill  . aspirin 81 MG chewable tablet Chew 1 tablet (81 mg total) by mouth daily. 30 tablet 5  . atorvastatin (LIPITOR) 80 MG tablet Take 1 tablet (80 mg total) by mouth daily at 6 PM. 90 tablet 0  . carvedilol (COREG) 3.125 MG tablet TAKE 1 TABLET BY MOUTH TWICE DAILY WITH MEALS 180 tablet 1  . digoxin (LANOXIN) 0.125 MG tablet Take 1 tablet (0.125 mg total) by mouth daily. 30 tablet 0  . ferrous sulfate 325 (65 FE) MG tablet Take 325 mg by mouth daily.    Marland Kitchen lisinopril (PRINIVIL,ZESTRIL) 2.5 MG tablet Take 1 tablet (2.5 mg total) by mouth daily. 90 tablet 2  . Multiple Vitamin (MULTIVITAMIN WITH MINERALS) TABS tablet Take 1 tablet by mouth daily. 30 tablet 0  . nitroGLYCERIN (NITROSTAT) 0.4 MG SL tablet Place 1 tablet (0.4 mg total) under  the tongue every 5 (five) minutes as needed for chest pain. 25 tablet 6  . saccharomyces boulardii (FLORASTOR) 250 MG capsule Take 1 capsule (250 mg total) by mouth 2 (two) times daily. 30 capsule 0  . clopidogrel (PLAVIX) 75 MG tablet Take 1 tablet (75 mg total) by mouth daily. 90 tablet 1   No current facility-administered medications for this visit.     Allergies  Allergen Reactions  . Capecitabine Other (See Comments)    Server dehydration and diarrhea      . Celebrex [Celecoxib] Other (See Comments)    Caused his back to burn  . Contrast Media [Iodinated Diagnostic Agents] Other (See Comments)    Patient feels weakness    Social History   Socioeconomic History  . Marital status: Single    Spouse name: Not on file  . Number of children: 0  . Years of education: 64  . Highest education level: Not on file  Occupational History    . Occupation: Disabled  Social Needs  . Financial resource strain: Not on file  . Food insecurity:    Worry: Not on file    Inability: Not on file  . Transportation needs:    Medical: Not on file    Non-medical: Not on file  Tobacco Use  . Smoking status: Former Smoker    Packs/day: 2.00    Years: 40.00    Pack years: 80.00    Types: Cigarettes    Last attempt to quit: 12/22/2014    Years since quitting: 3.2  . Smokeless tobacco: Never Used  Substance and Sexual Activity  . Alcohol use: No    Comment: quit in 08/2013, he used to drink alcohol moderate (1 pine liquor one week)  for 30 years   . Drug use: No  . Sexual activity: Not Currently    Birth control/protection: Condom    Comment: men   Lifestyle  . Physical activity:    Days per week: Not on file    Minutes per session: Not on file  . Stress: Not on file  Relationships  . Social connections:    Talks on phone: Not on file    Gets together: Not on file    Attends religious service: Not on file    Active member of club or organization: Not on file    Attends meetings of clubs or organizations: Not on file    Relationship status: Not on file  . Intimate partner violence:    Fear of current or ex partner: Not on file    Emotionally abused: Not on file    Physically abused: Not on file    Forced sexual activity: Not on file  Other Topics Concern  . Not on file  Social History Narrative   Lives alone.    In a house trying to sell his home. Exercise: No.    Family History  Problem Relation Age of Onset  . Emphysema Father   . Heart disease Mother   . Dementia Mother   . Diabetes Mother   . CVA Mother   . Alzheimer's disease Mother   . Pancreatic cancer Maternal Grandmother   . Lung cancer Maternal Grandmother   . Cancer Neg Hx     Review of Systems:  As stated in the HPI and otherwise negative.   BP 112/84   Pulse 75   Ht 5\' 7"  (1.702 m)   Wt 176 lb (79.8 kg)   SpO2 97%   BMI 27.57  kg/m    Physical Examination:  General: Well developed, well nourished, NAD  HEENT: OP clear, mucus membranes moist  SKIN: warm, dry. No rashes. Neuro: No focal deficits  Musculoskeletal: Muscle strength 5/5 all ext  Psychiatric: Mood and affect normal  Neck: No JVD, no carotid bruits, no thyromegaly, no lymphadenopathy.  Lungs:Clear bilaterally, no wheezes, rhonci, crackles Cardiovascular: Regular rate and rhythm. No murmurs, gallops or rubs. Abdomen:Soft. Bowel sounds present. Non-tender.  Extremities: No lower extremity edema. Pulses are 2 + in the bilateral DP/PT.  Echo April 2018: - Left ventricle: The cavity size was mildly dilated. Wall   thickness was normal. Systolic function was severely reduced. The   estimated ejection fraction was in the range of 25% to 30%.   Akinesis of the anteroseptal, anterior, and apical myocardium.   Doppler parameters are consistent with abnormal left ventricular   relaxation (grade 1 diastolic dysfunction).  EKG:  EKG is ordered today. The ekg ordered today demonstrates NSR, rate 75 bpm. Poor R wave progression in the precordial leads. Unchanged  Recent Labs: 02/06/2018: ALT 27; BUN 24; Creatinine, Ser 1.18; Hemoglobin 13.7; Platelets 131; Potassium 4.9; Sodium 140 02/10/2018: NT-Pro BNP 779   Lipid Panel    Component Value Date/Time   CHOL 128 02/10/2018 0900   TRIG 146 02/10/2018 0900   HDL 36 (L) 02/10/2018 0900   CHOLHDL 3.6 02/10/2018 0900   CHOLHDL 3.9 12/23/2014 0331   VLDL 13 12/23/2014 0331   LDLCALC 63 02/10/2018 0900     Wt Readings from Last 3 Encounters:  03/07/18 176 lb (79.8 kg)  02/26/18 177 lb 8 oz (80.5 kg)  02/10/18 176 lb 12.8 oz (80.2 kg)    Assessment and Plan:   1. CAD without angina: He has rare chest pains with heavy exertion. Last cath in 2016. No worrisome features for unstable angina. Will continue ASA, Plavix, statin and beta blocker.   2. Ischemic cardiomyopathy: LVEF=30% by echo in 2018. He has no  evidence of volume overload. No LE edema or weight gain. He is on good medical therapy. ICD in place and followed by Dr. Lovena Le. Will continue Coreg and Lisinopril. He has refused to consider Entresto due to cost.   3. HTN: BP controlled. Continue current therapy  4. Hyperlipidemia: Lipids well controlled at check last month. Continue statin.   5. Former tobacco abuse  6. Pre-operative cardiovascular risk assessment: Planned colonoscopy. He is doing well from a cardiac standpoint. He is NYHA class 1 and can achieve over 4 METS daily. No further cardiac workup is indicated at this time. He can hold his Plavix 5 days before his procedure. He can also hold his ASA one week prior if indicated. I will route this note to Dr. Loletha Carrow in [facility].   He is asked to re-establish in primary care.  I will see him back in 6 months. If Dr. Wynonia Lawman is back in practice we will have him seen by Dr. Wynonia Lawman  Current medicines are reviewed at length with the patient today.  The patient does not have concerns regarding medicines.  The following changes have been made:  no change  Labs/ tests ordered today include:   Orders Placed This Encounter  Procedures  . EKG 12-Lead    Signed, Lauree Chandler, MD 03/07/2018 10:07 AM    [facility] [place], [place], [place]  [address] Phone: [phone]; Fax: [phone]

## 2018-03-07 ENCOUNTER — Ambulatory Visit (INDEPENDENT_AMBULATORY_CARE_PROVIDER_SITE_OTHER): Payer: Medicare Other | Admitting: Cardiovascular Disease

## 2018-03-07 ENCOUNTER — Encounter: Payer: Self-pay | Admitting: Cardiovascular Disease

## 2018-03-07 VITALS — BP 112/84 | HR 75 | Ht 67.0 in | Wt 176.0 lb

## 2018-03-07 DIAGNOSIS — Z01810 Encounter for preprocedural cardiovascular examination: Secondary | ICD-10-CM

## 2018-03-07 DIAGNOSIS — E785 Hyperlipidemia, unspecified: Secondary | ICD-10-CM | POA: Diagnosis not present

## 2018-03-07 DIAGNOSIS — I255 Ischemic cardiomyopathy: Secondary | ICD-10-CM | POA: Diagnosis not present

## 2018-03-07 DIAGNOSIS — I10 Essential (primary) hypertension: Secondary | ICD-10-CM

## 2018-03-07 DIAGNOSIS — I2510 Atherosclerotic heart disease of native coronary artery without angina pectoris: Secondary | ICD-10-CM

## 2018-03-07 MED ORDER — NITROGLYCERIN 0.4 MG SL SUBL: 0.4000 mg | SUBLINGUAL_TABLET | SUBLINGUAL | 6 refills | Status: DC | PRN

## 2018-03-07 NOTE — Patient Instructions (Signed)

## 2018-03-09 ENCOUNTER — Telehealth: Payer: Self-pay | Admitting: Gastroenterology

## 2018-03-09 NOTE — Telephone Encounter (Signed)
Christopher [name],  I received the note from last week's office visit that Christopher [name] had with the cardiologist.  They were kind enough to evaluate him on short notice regarding any need for cardiac stress testing prior to planned routine colonoscopy for colon cancer surveillance.  The cardiologist indicated that Christopher [name] does not need a stress test or other preprocedure work-up.  Please see my recent office note for details.  Christopher [name] needs a routine colonoscopy for a history of colon cancer.  It must be done in the Christopher [name] due to his cardiac history.  He can remain on aspirin up to the day prior to the procedure, and will be off Plavix 5 days prior.  Cardiology has given their approval for that plan.  I believe Christopher [name] told me that he had difficulty with which ever bowel preparation he took for the last colonoscopy.  I cannot recall if he said it was Suprep or Plenvu.  Please check with him, and use the other one.  (Please check with Christopher [name] because I believe there is a March 13 morning Christopher [name] available.  I think this would be ideal for Christopher [name] unless he wants to for the next available opening in April for this routine procedure.)

## 2018-03-10 NOTE — Telephone Encounter (Signed)
Left message on machine to call back  

## 2018-03-11 ENCOUNTER — Other Ambulatory Visit: Payer: Self-pay

## 2018-03-11 ENCOUNTER — Telehealth: Payer: Self-pay | Admitting: Gastroenterology

## 2018-03-11 DIAGNOSIS — Z85038 Personal history of other malignant neoplasm of large intestine: Secondary | ICD-10-CM

## 2018-03-11 MED ORDER — PEG-KCL-NACL-NASULF-NA ASC-C 140 G PO SOLR: 1.0000 | ORAL | 0 refills | Status: DC

## 2018-03-11 NOTE — Telephone Encounter (Signed)
Put on the 04-11-2018 date At [facility]

## 2018-03-11 NOTE — Telephone Encounter (Signed)
Pt scheduled, prep sent instructions in Epic Left message on machine to call back

## 2018-03-11 NOTE — Telephone Encounter (Signed)
Pt stated that he is not able to do the proc on 003/13/2020 and will like a call to discuss the dates that he are avail.

## 2018-03-11 NOTE — Telephone Encounter (Signed)
Dr Loletha Carrow you don't have any time at [facility] in March.  The April schedule is not out yet, is it ok to wait until the schedule comes out?

## 2018-03-11 NOTE — Telephone Encounter (Signed)
See alternate phone note  

## 2018-03-11 NOTE — Telephone Encounter (Signed)
04/11/18 colon at [facility] 1045 am with Dr Loletha Carrow

## 2018-03-12 NOTE — Telephone Encounter (Signed)
The pt has called and can not do 3/13.  He was advised that our schedule is not out for April at this time.  He will be called to set up when the schedule is out.

## 2018-03-20 IMAGING — CR DG ABDOMEN 1V
1 series · 1 of 1 positions shown · non-contrast
Comparison: Abdominal and pelvic CT scan May 09, 2016

CLINICAL DATA: History of colonic malignancy, bowel obstruction.

EXAM:
ABDOMEN - 1 VIEW

[abdomen kub]
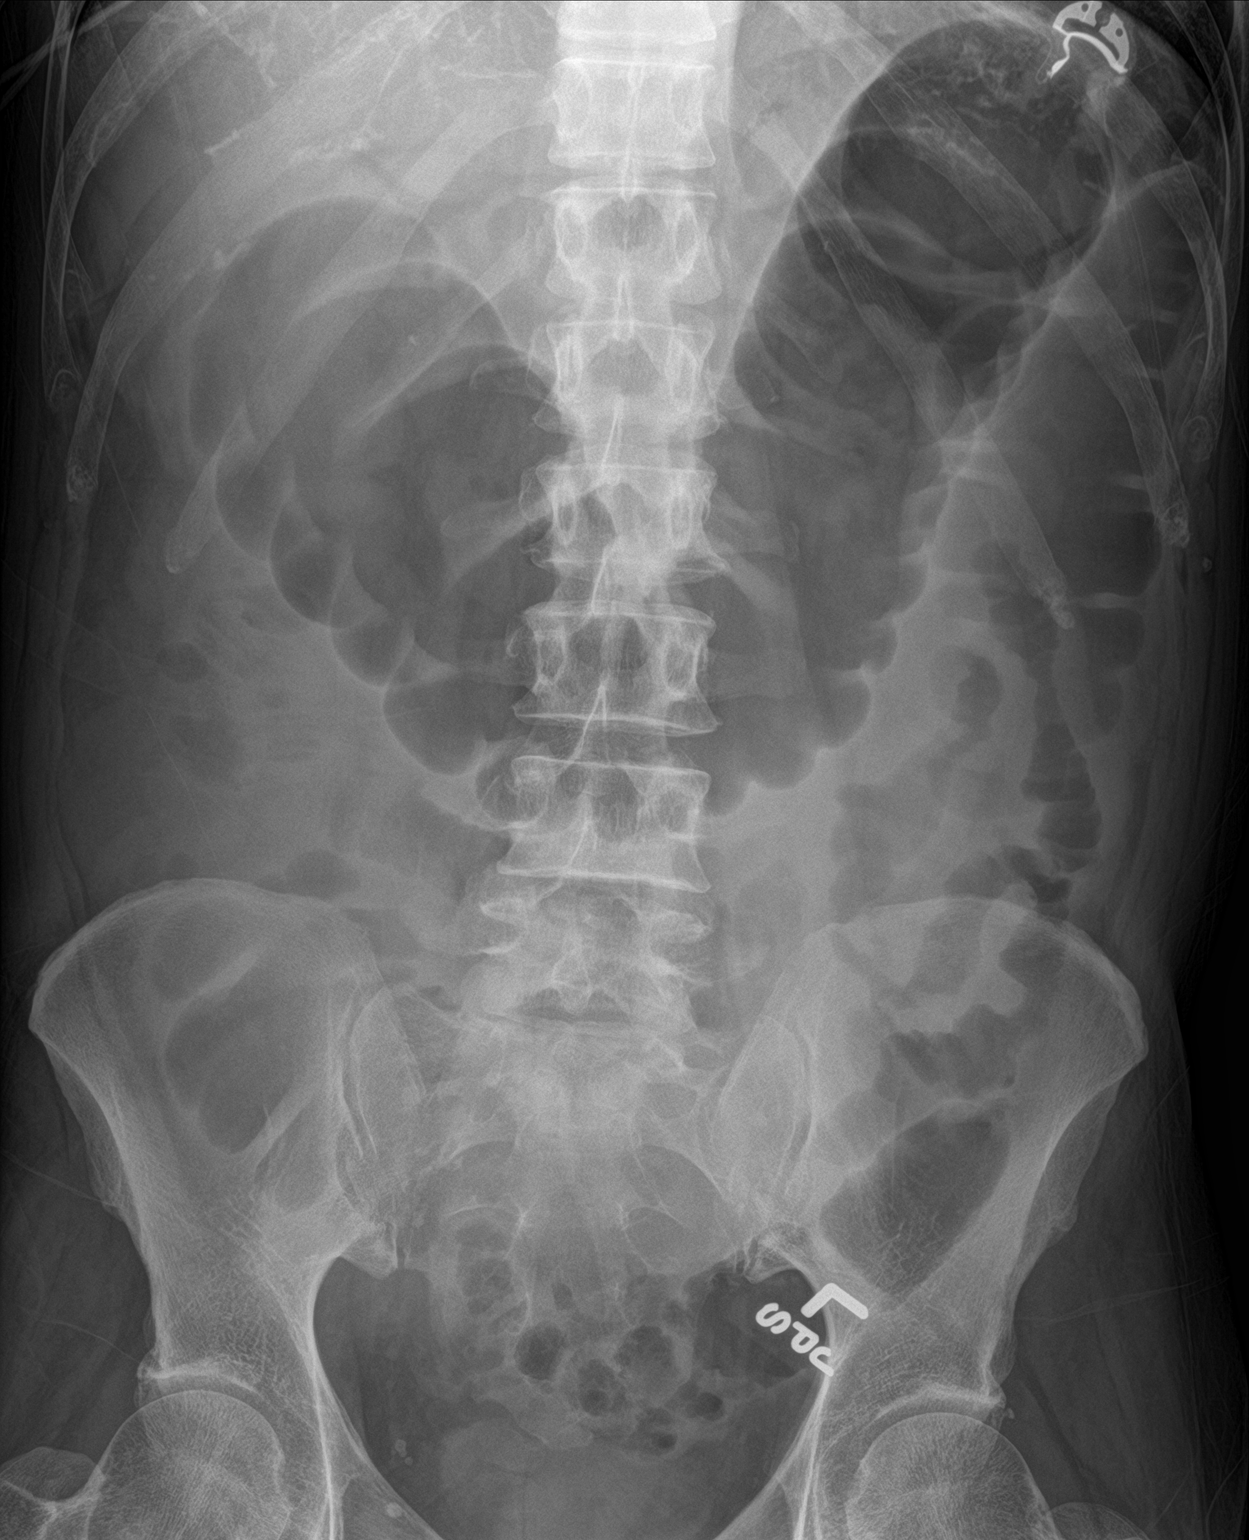

[1 of 1 positions shown; findings below may reference images not displayed]

FINDINGS: There is moderate gaseous distention of the transverse and proximal
descending colon. There is a moderate amount of gas within the
cecum. The rectosigmoid exhibits only a small amount of gas. No
small bowel obstructive pattern is observed. No free extraluminal
gas collections are demonstrated.
IMPRESSION: Persistent moderate gaseous distention of the colon with transition
zone in the mid descending colon. No high-grade obstruction. No
evidence of perforation.

## 2018-03-23 IMAGING — CR DG ABDOMEN 1V
1 series · 1 of 1 positions shown · non-contrast
Comparison: CT 05/09/2016, KUB 05/17/2016

CLINICAL DATA: Pain and distention

EXAM:
ABDOMEN - 1 VIEW

[AP]
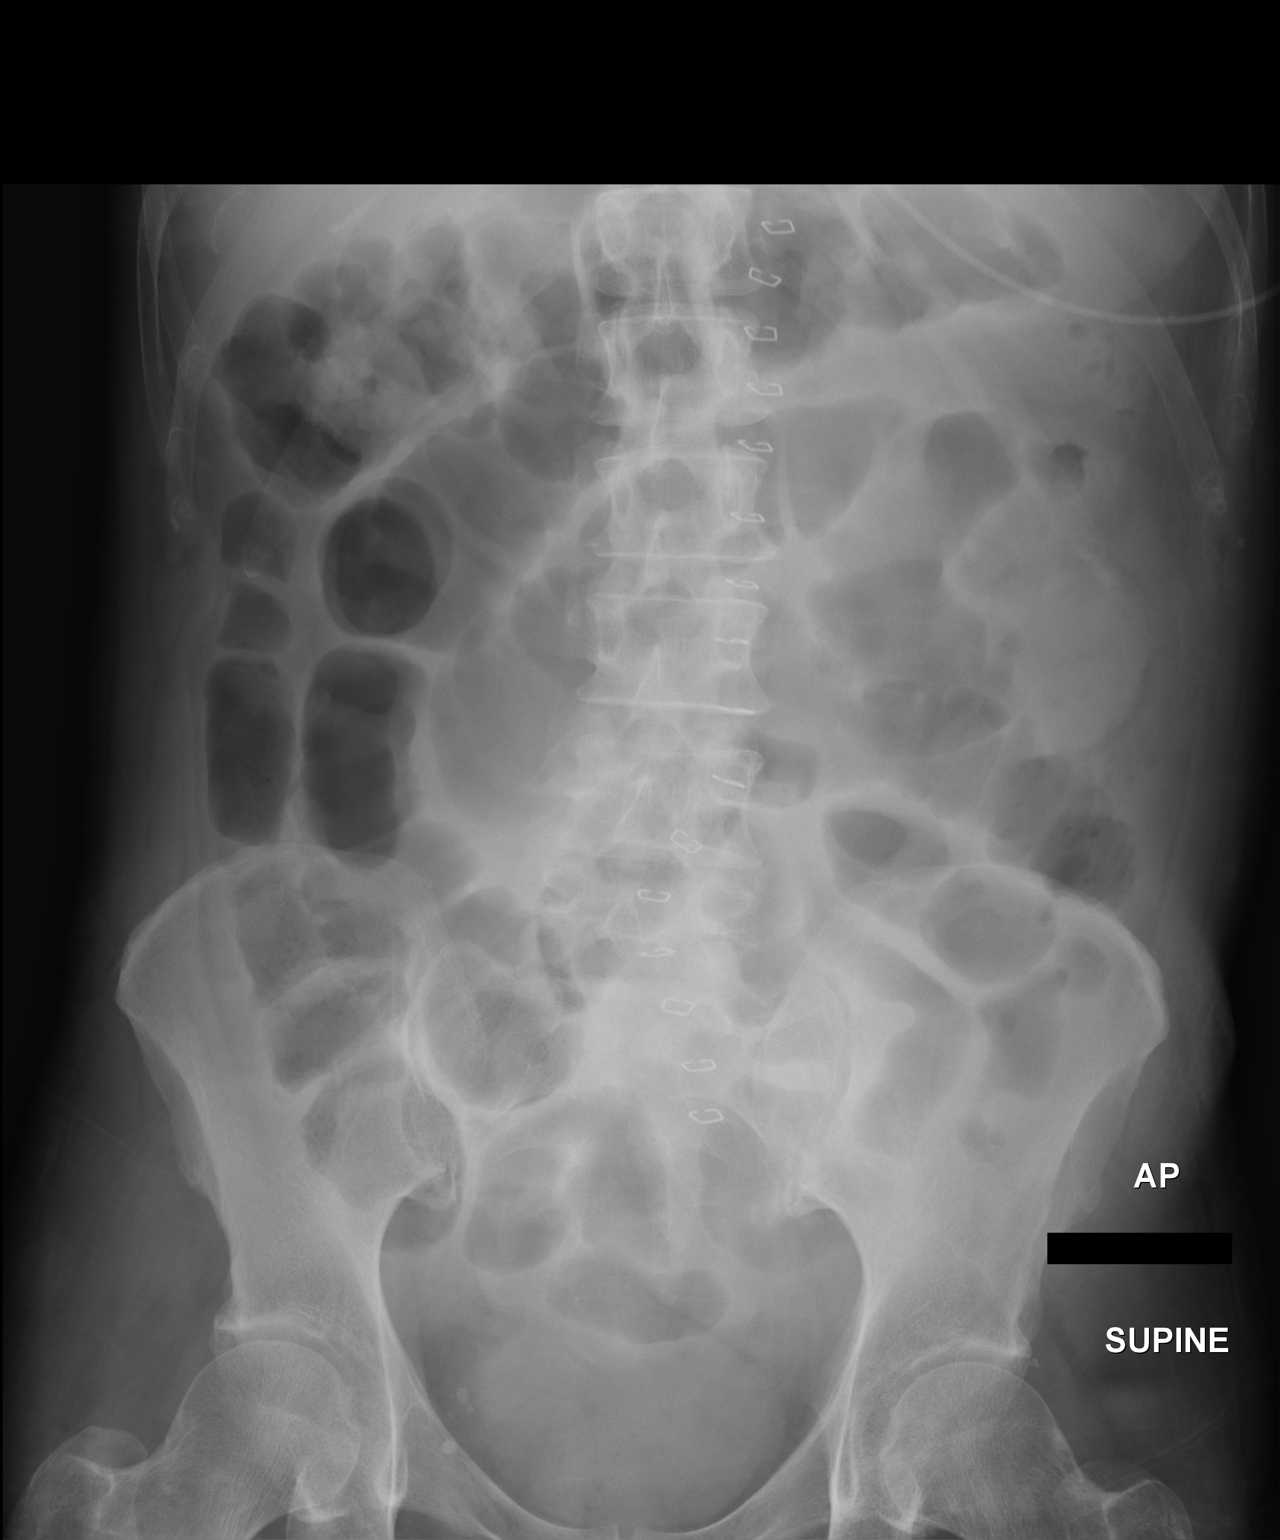

[1 of 1 positions shown; findings below may reference images not displayed]

FINDINGS: Surgical staples over the midline abdomen. Mild diffuse gaseous
dilatation of small and large bowel. Calcified phleboliths in the
right pelvis. Decreased: Distension compared to prior radiograph.
IMPRESSION: Decreased colonic distention compared to previous. Mild diffuse
gaseous dilatation of small and large bowel could be secondary to an
ileus.

## 2018-03-24 ENCOUNTER — Ambulatory Visit: Payer: Self-pay | Admitting: Cardiology

## 2018-03-26 ENCOUNTER — Ambulatory Visit (INDEPENDENT_AMBULATORY_CARE_PROVIDER_SITE_OTHER): Payer: Medicare Other | Admitting: *Deleted

## 2018-03-26 DIAGNOSIS — I255 Ischemic cardiomyopathy: Secondary | ICD-10-CM | POA: Diagnosis not present

## 2018-03-27 LAB — CUP PACEART REMOTE DEVICE CHECK
Battery Remaining Longevity: 94 mo
Battery Voltage: 2.99 V
Brady Statistic RV Percent Paced: 0.01 %
Date Time Interrogation Session: 20200226142007
HighPow Impedance: 68 Ohm
Implantable Lead Implant Date: 20150810
Implantable Lead Location: 753860
Implantable Lead Model: 6935
Implantable Pulse Generator Implant Date: 20150810
Lead Channel Impedance Value: 456 Ohm
Lead Channel Pacing Threshold Pulse Width: 0.4 ms
Lead Channel Sensing Intrinsic Amplitude: 6.375 mV
Lead Channel Sensing Intrinsic Amplitude: 6.375 mV
Lead Channel Setting Pacing Amplitude: 2 V
Lead Channel Setting Pacing Pulse Width: 0.4 ms
MDC IDC MSMT LEADCHNL RV IMPEDANCE VALUE: 361 Ohm
MDC IDC MSMT LEADCHNL RV PACING THRESHOLD AMPLITUDE: 0.5 V
MDC IDC SET LEADCHNL RV SENSING SENSITIVITY: 0.3 mV

## 2018-04-01 ENCOUNTER — Other Ambulatory Visit: Payer: Self-pay

## 2018-04-01 ENCOUNTER — Telehealth: Payer: Self-pay

## 2018-04-01 ENCOUNTER — Telehealth: Payer: Self-pay | Admitting: Gastroenterology

## 2018-04-01 DIAGNOSIS — Z85038 Personal history of other malignant neoplasm of large intestine: Secondary | ICD-10-CM

## 2018-04-01 DIAGNOSIS — Z1211 Encounter for screening for malignant neoplasm of colon: Secondary | ICD-10-CM

## 2018-04-01 NOTE — Telephone Encounter (Signed)
Pt called to sched appt for [facility] colon

## 2018-04-01 NOTE — Telephone Encounter (Signed)
See additional phone note dated 04/01/18 for appt.

## 2018-04-01 NOTE — Telephone Encounter (Signed)
Pt scheduled for previsit 04/11/18@1 :30pm, colon scheduled at [facility] 05/13/18@8 :30am. Pt notified via mychart message.

## 2018-04-01 NOTE — Telephone Encounter (Signed)
-----   Message from [name], RN sent at 03/12/2018  9:36 AM EST ----- [name] I tried to schedule this pt for 3/13 and he was unable to keep that appt.  I advised him we would have to call him when the April schedule is out.    [name],  I received the note from last week's office visit that [name] had with the cardiologist.  They were kind enough to evaluate him on short notice regarding any need for cardiac stress testing prior to planned routine colonoscopy for colon cancer surveillance.  The cardiologist indicated that [name] does not need a stress test or other preprocedure work-up.  Please see my recent office note for details.  Brace needs a routine colonoscopy for a history of colon cancer.  It must be done in the [facility] lab due to his cardiac history.  He can remain on aspirin up to the day prior to the procedure, and will be off Plavix 5 days prior.  Cardiology has given their approval for that plan.  I believe [name] told me that he had difficulty with which ever bowel preparation he took for the last colonoscopy.  I cannot recall if he said it was Suprep or Plenvu.  Please check with him, and use the other one.

## 2018-04-02 ENCOUNTER — Encounter: Payer: Self-pay | Admitting: Cardiology

## 2018-04-02 NOTE — Telephone Encounter (Signed)
Patient is scheduled for 05-12-2018 @ [facility]

## 2018-04-02 NOTE — Progress Notes (Signed)
Remote ICD transmission.   

## 2018-04-11 ENCOUNTER — Ambulatory Visit (AMBULATORY_SURGERY_CENTER): Payer: Self-pay

## 2018-04-11 ENCOUNTER — Encounter (HOSPITAL_COMMUNITY): Payer: Self-pay

## 2018-04-11 ENCOUNTER — Ambulatory Visit (HOSPITAL_COMMUNITY): Admit: 2018-04-11 | Payer: Medicare Other | Admitting: Gastroenterology

## 2018-04-11 ENCOUNTER — Other Ambulatory Visit: Payer: Self-pay

## 2018-04-11 VITALS — Temp 97.8°F | Ht 67.5 in | Wt 177.4 lb

## 2018-04-11 DIAGNOSIS — Z85038 Personal history of other malignant neoplasm of large intestine: Secondary | ICD-10-CM

## 2018-04-11 SURGERY — COLONOSCOPY WITH PROPOFOL
Anesthesia: Monitor Anesthesia Care

## 2018-04-11 MED ORDER — NA SULFATE-K SULFATE-MG SULF 17.5-3.13-1.6 GM/177ML PO SOLN: 1.0000 | Freq: Once | ORAL | 0 refills | Status: AC

## 2018-04-11 NOTE — Progress Notes (Signed)
Per pt, no allergies to soy or egg products.Pt not taking any weight loss meds or using  O2 at home.   Pt refused emmi video.  Temp-97.8 Pt states he has not gone out of the country. No fever or signs or symptoms of any illness.

## 2018-04-16 ENCOUNTER — Telehealth: Payer: Self-pay | Admitting: Gastroenterology

## 2018-04-16 NOTE — Telephone Encounter (Signed)
Pt requested a CB to discuss his BM.  Pt informed that he has been constipated and took laxatives.  He stated that "he does not like the smell."

## 2018-04-16 NOTE — Telephone Encounter (Signed)
If he has still been taking iron tablets, discontinue them.  Use miralax 1 capful in large glass liquid two times daily for next 2 days or until constipation relieved.

## 2018-04-16 NOTE — Telephone Encounter (Signed)
Pt calling stating he has not been passing stool regularly for 2 days. States he has taken 2 tbsp of clearlax and 2 tbsp of castor oil and he is only passing little flecks. Reports his stomach feels very tight and hard and he is scared. He is afraid to take any more laxatives. Please advise.

## 2018-04-16 NOTE — Telephone Encounter (Signed)
Spoke with pt and he is aware. Pt will call us back if he has any other problems.

## 2018-04-17 ENCOUNTER — Telehealth: Payer: Self-pay

## 2018-04-17 NOTE — Telephone Encounter (Signed)
Pt. is very upset with the cancellation of his colonoscopy at the hospital. He is requesting a phone call from management.

## 2018-04-17 NOTE — Telephone Encounter (Signed)
Patient is scheduled at [place] for colonoscopy 05-13-2018 @ 830am

## 2018-04-17 NOTE — Telephone Encounter (Signed)
Left a message to return call.  

## 2018-04-17 NOTE — Telephone Encounter (Signed)
This is a routine surveillance procedure for history of colon cancer.  Due to COVID-19 restrictions on routine procedures, cancel the procedure for April 13 and put in recall for June 2020.  Please inform the patient.  Naturally, he would no longer need to hold Plavix 5 days before the April 13 date.

## 2018-04-17 NOTE — Telephone Encounter (Signed)
Pt returned your call.  

## 2018-04-18 NOTE — Telephone Encounter (Signed)
I spoke with the patient at length.  I reassured him that we will call him back to reschedule his procedure as soon as we have the go- ahead from the hospital.  He verbalized understanding and thanked me for the call.

## 2018-04-22 ENCOUNTER — Telehealth: Payer: Self-pay

## 2018-04-22 NOTE — Telephone Encounter (Signed)
Pt states he is taking 4 Tbsp of castor oil a day. He ran out of miralax 3 days ago and does not get paid until tomorrow. States he is having a small amt of stool but not a complete movement. Reports where his surgery was it feels like stool is not passing in that area. Pt states he does not want to take something all the time to have BM. Discussed with him that there are people that have to take miralax 2-3 times daily to have a bowel movement. He will get more miralax tomorrow and resume the BID dose of it. Pt calling to see if Dr. Loletha Carrow has any other recommendation for him. Please advise.

## 2018-04-22 NOTE — Telephone Encounter (Signed)
Left message for pt to call back  °

## 2018-04-22 NOTE — Telephone Encounter (Signed)
I understand his concern, but I would like to reassure him.  The surgical connection site is typically of smaller diameter than normal colon, but that is expected.  It is very unlikely to be recurrence of the cancer.  It is also unlikely to be narrowed down enough that it needs to be opened up by colonoscopy or surgery.  Because it is surgically altered, stool is often slow to pass through that area, and he will likely need to take laxatives on a regular basis to remain regular.  I recommend the MiraLAX 3 times a day as needed, because it generally works well and it is the safest therapy to take with his medical history and medicines.  He will be reevaluated it when we are able to do his colonoscopy.

## 2018-04-23 ENCOUNTER — Other Ambulatory Visit: Payer: Self-pay | Admitting: Cardiology

## 2018-04-23 ENCOUNTER — Telehealth: Payer: Self-pay | Admitting: Cardiovascular Disease

## 2018-04-23 MED ORDER — DIGOXIN 125 MCG PO TABS: 0.1250 mg | ORAL_TABLET | Freq: Every day | ORAL | 3 refills | Status: DC

## 2018-04-23 NOTE — Telephone Encounter (Signed)
This patient is now seen by Dr Angelena Form.  Please refill.  Thank you

## 2018-04-23 NOTE — Telephone Encounter (Signed)
Pt's medication was sent to pt's pharmacy as requested. Confirmation received.  °

## 2018-04-23 NOTE — Telephone Encounter (Signed)
We can refill the Digoxin for him. THanks, chris

## 2018-04-23 NOTE — Telephone Encounter (Signed)
Pt aware.

## 2018-04-23 NOTE — Telephone Encounter (Signed)
Called pt's pharmacy and confirmed that pt's medication was ok to refill. Pharmacy verbalized understanding.

## 2018-04-23 NOTE — Telephone Encounter (Signed)
New Message    *STAT* If patient is at the pharmacy, call can be transferred to refill team.   1. Which medications need to be refilled? (please list name of each medication and dose if known) Digoxin 0.125mg    2. Which pharmacy/location (including street and city if local pharmacy) is medication to be sent to? [facility] on  [address]  3. Do they need a 30 day or 90 day supply?  30 Day supply

## 2018-04-23 NOTE — Telephone Encounter (Signed)
Pt calling requesting a refill on digoxin. Dr. Fidela Juneau prescribed this medication. Would Dr. Angelena Form like to refill this medication? Please address

## 2018-04-23 NOTE — Telephone Encounter (Signed)
Pharmacy called and said the manufacturer of digoxin (LANOXIN) 0.125 MG tablet changed, and they need approval from Dr. Angelena Form to fill the rx with the new brand

## 2018-04-24 NOTE — Telephone Encounter (Signed)
It looks like you have already done this.

## 2018-04-28 ENCOUNTER — Telehealth: Payer: Self-pay | Admitting: Gastroenterology

## 2018-04-28 NOTE — Telephone Encounter (Signed)
Pt called in wanting to speak with the nurse for advice. He advised that he is still having problems with his bowels not moving. He has so much gas that it feels like he is going "to blow up and die  with crap inside of him." he will like a call back

## 2018-04-29 NOTE — Telephone Encounter (Signed)
Spoke with pt and reinforced Dr. Loletha Carrow' recommendations for using miralax tid and that his stool may pass through slower than it used to. Pt states he is passing stool but there is not much form to it. Discussed with pt that as long as he is passing stool he is ok. Pt aware.

## 2018-05-10 NOTE — Telephone Encounter (Signed)
done

## 2018-05-13 ENCOUNTER — Ambulatory Visit (HOSPITAL_COMMUNITY): Admit: 2018-05-13 | Payer: Medicare Other | Admitting: Gastroenterology

## 2018-05-13 ENCOUNTER — Encounter (HOSPITAL_COMMUNITY): Payer: Self-pay

## 2018-05-13 SURGERY — COLONOSCOPY WITH PROPOFOL
Anesthesia: Monitor Anesthesia Care

## 2018-05-19 ENCOUNTER — Other Ambulatory Visit: Payer: Self-pay | Admitting: Cardiology

## 2018-05-20 ENCOUNTER — Other Ambulatory Visit: Payer: Self-pay | Admitting: Cardiovascular Disease

## 2018-05-20 MED ORDER — ATORVASTATIN CALCIUM 80 MG PO TABS: ORAL_TABLET | ORAL | 3 refills | Status: DC

## 2018-05-20 NOTE — Telephone Encounter (Signed)
Atorvastatin sent to [facility] on [address]

## 2018-05-20 NOTE — Telephone Encounter (Signed)
Pt's medication was sent to pt's pharmacy as requested. Confirmation received.  °

## 2018-05-20 NOTE — Telephone Encounter (Signed)
°*  STAT* If patient is at the pharmacy, call can be transferred to refill team.   1. Which medications need to be refilled? (please list name of each medication and dose if known)  Need new prescriptions-changed doctor- Atorvastatin 80 mg  2. Which pharmacy/location (including street and city if local pharmacy) is medication to be sent to?[facility] [phone]  3. Do they need a 30 day or 90 day supply? 90 and refills

## 2018-06-09 ENCOUNTER — Other Ambulatory Visit: Payer: Self-pay

## 2018-06-09 ENCOUNTER — Ambulatory Visit (HOSPITAL_COMMUNITY): Payer: Self-pay

## 2018-06-09 ENCOUNTER — Inpatient Hospital Stay: Payer: Medicare Other | Attending: Hematology

## 2018-06-09 ENCOUNTER — Ambulatory Visit (HOSPITAL_COMMUNITY)
Admission: RE | Admit: 2018-06-09 | Discharge: 2018-06-09 | Disposition: A | Discharge status: Home / Self Care | Payer: Medicare Other | Source: Ambulatory Visit | Attending: Hematology | Admitting: Hematology

## 2018-06-09 ENCOUNTER — Ambulatory Visit (HOSPITAL_COMMUNITY): Payer: Medicare Other

## 2018-06-09 DIAGNOSIS — D500 Iron deficiency anemia secondary to blood loss (chronic): Secondary | ICD-10-CM

## 2018-06-09 DIAGNOSIS — C186 Malignant neoplasm of descending colon: Secondary | ICD-10-CM

## 2018-06-09 DIAGNOSIS — C189 Malignant neoplasm of colon, unspecified: Secondary | ICD-10-CM

## 2018-06-09 LAB — COMPREHENSIVE METABOLIC PANEL
ALT: 32 U/L (ref 0–44)
AST: 24 U/L (ref 15–41)
Albumin: 4 g/dL (ref 3.5–5.0)
Alkaline Phosphatase: 53 U/L (ref 38–126)
Anion gap: 10 (ref 5–15)
BUN: 22 mg/dL — ABNORMAL HIGH (ref 6–20)
CO2: 24 mmol/L (ref 22–32)
Calcium: 8.8 mg/dL — ABNORMAL LOW (ref 8.9–10.3)
Chloride: 105 mmol/L (ref 98–111)
Creatinine, Ser: 1.19 mg/dL (ref 0.61–1.24)
GFR calc Af Amer: >60 mL/min (ref >60)
GFR calc non Af Amer: >60 mL/min (ref >60)
Glucose, Bld: 106 mg/dL — ABNORMAL HIGH (ref 70–99)
Potassium: 4.4 mmol/L (ref 3.5–5.1)
Sodium: 139 mmol/L (ref 135–145)
Total Bilirubin: 0.2 mg/dL — ABNORMAL LOW (ref 0.3–1.2)
Total Protein: 7 g/dL (ref 6.5–8.1)

## 2018-06-09 LAB — CEA (IN HOUSE-CHCC): CEA (CHCC-In House): 4.19 ng/mL (ref 0.00–5.00)

## 2018-06-09 LAB — CBC WITH DIFFERENTIAL/PLATELET
Abs Immature Granulocytes: 0.01 10*3/uL (ref 0.00–0.07)
Basophils Absolute: 0 10*3/uL (ref 0.0–0.1)
Basophils Relative: 0 %
Eosinophils Absolute: 0.2 10*3/uL (ref 0.0–0.5)
Eosinophils Relative: 2 %
HCT: 40.6 % (ref 39.0–52.0)
Hemoglobin: 13.4 g/dL (ref 13.0–17.0)
Immature Granulocytes: 0 %
Lymphocytes Relative: 31 %
Lymphs Abs: 2.1 10*3/uL (ref 0.7–4.0)
MCH: 30.8 pg (ref 26.0–34.0)
MCHC: 33 g/dL (ref 30.0–36.0)
MCV: 93.3 fL (ref 80.0–100.0)
Monocytes Absolute: 0.7 10*3/uL (ref 0.1–1.0)
Monocytes Relative: 10 %
Neutro Abs: 3.7 10*3/uL (ref 1.7–7.7)
Neutrophils Relative %: 57 %
Platelets: 126 10*3/uL — ABNORMAL LOW (ref 150–400)
RBC: 4.35 MIL/uL (ref 4.22–5.81)
RDW: 14.2 % (ref 11.5–15.5)
WBC: 6.7 10*3/uL (ref 4.0–10.5)
nRBC: 0 % (ref 0.0–0.2)

## 2018-06-09 LAB — IRON AND TIBC
Iron: 64 ug/dL (ref 42–163)
Saturation Ratios: 24 % (ref 20–55)
TIBC: 268 ug/dL (ref 202–409)
UIBC: 204 ug/dL (ref 117–376)

## 2018-06-09 LAB — FERRITIN: Ferritin: 43 ng/mL (ref 24–336)

## 2018-06-09 NOTE — Telephone Encounter (Signed)
Offered Christopher [name] a possibility of rescheduling his colonoscopy for 06-17-2018. Pt feels like he would like to wait until June or even July.

## 2018-06-09 NOTE — Progress Notes (Signed)
[place]   Telephone:([phone]) [phone] Fax:(336) [phone]   Clinic Follow up Note   Patient Care Team: Patient, No Pcp Per as PCP - General (General Practice) Burnell Blanks, MD as PCP - Cardiology (Cardiology) [name], MD as Consulting Physician (Cardiology) [name], MD as Consulting Physician (Cardiology) Christopher Merle, MD as Consulting Physician (Hematology) [name], MD as Consulting Physician (General Surgery)   I connected with [name] on 06/11/2018 at 11:30 AM EDT by telephone visit and verified that I am speaking with the correct person using two identifiers.  I discussed the limitations, risks, security and privacy concerns of performing an evaluation and management service by telephone and the availability of in person appointments. I also discussed with the patient that there may be a patient responsible charge related to this service. The patient expressed understanding and agreed to proceed.   [name] location:  His home  Provider's location:  My Office   CHIEF COMPLAINT: F/u on left side colon cancer, stage IIIB  SUMMARY OF ONCOLOGIC HISTORY: Oncology History   Cancer Staging Adenocarcinoma of descending colon  pT3, pN1a, pMX s/p colectomy/ostomy 05/18/2016 Staging form: Colon and Rectum, AJCC 8th Edition - Pathologic stage from 05/18/2016: Stage IIIB (pT3, pN1a, cM0) - Signed by Christopher Merle, MD on 07/01/2016       Adenocarcinoma of descending colon  pT3, pN1a, pMX s/p colectomy/ostomy 05/18/2016   05/09/2016 Imaging    CT chest, abdomen and pelvis showed probable descending colon carcinoma. No evidence of distant metastasis. 7 cm hypervascular focus in the upper liver, not typical for metastatic disease.    05/16/2016 Procedure    Colonoscopy showed a large mass which completely obstructss the distal descending colon. Biopsied. Diverticulosis in the left colon.    05/18/2016 Surgery    Left hemicolectomy by Dr. [name]     05/18/2016 Pathology Results    Left colon segmental resection shows invasive colorectal adenocarcinoma, 4.6 cm, tumor extends into subserosa connective tissue, margins are negative, metastatic carcinoma in 1 of 12 lymph nodes. Grade 2, lymphovascular invasion present, perineural invasion negative.    05/18/2016 Miscellaneous    MSI-stable     05/18/2016 Initial Diagnosis    Adenocarcinoma of descending colon  pT3, pN1a, pMX s/p colectomy/ostomy 05/18/2016    07/16/2016 - 08/22/2016 Chemotherapy    Xeloda twice a day for 2 weeks on and 1 week off.  stopped after 3 days due to severe diarrhea, and changed to 1514m bid, started on 07/31/2016; Hold Xeloda starting 08/22/16 due to AKI. Will lower Xeloda to 3 in the morning and 2 in the evening when restart next cycle.   Stopped due to poor tolerance of sever diarrhea.      01/14/2017 Imaging    CT CAP W Contrast 01/14/17  IMPRESSION: 1. Stable exam. No new or progressive findings in the chest, abdomen, or pelvis. The tiny hypervascular focus in the dome of the liver seen on the previous study is not evident on today's exam. No evidence for metastatic disease on today's CT scan. 2.  Aortic Atherosclerois (ICD10-170.0) 3. Distal transverse end colostomy. 4.  Emphysema. (ICD10-J43.9)    04/01/2017 Procedure    04/01/2017 - One 6 mm polyp in the cecum, removed with a cold snare. Resected and retrieved. - Diverticulosis in the left colon.    04/01/2017 Pathology Results    Colon, polyp(s), cecal cold snare - TUBULAR ADENOMA (ONE FRAGMENT). - NO HIGH GRADE DYSPLASIA OR MALIGNANCY.  05/20/2017 Surgery    Surgery:05/20/2017 COLOSTOMY TAKEDOWN [name] MD     05/20/2017 Pathology Results    Pathology: 05/20/2017 Stoma - FINDINGS CONSISTENT WITH OSTOMY. - NO DYSPLASIA OR MALIGNANCY.     06/02/2017 Imaging    06/02/2017 CT AP IMPRESSION: 1. Postsurgical wounds in the anterior abdominal wall with skin staples and edema. No dehiscence or  discrete fluid collection. 2. Partial colectomy with anastomosis in left hemiabdomen. Mild edema surrounding site of anastomosis, probably postsurgical changes. 3. No abscess, bowel wall thickening, or obstruction identified.    06/12/2017 Imaging    06/12/2017 CT AP IMPRESSION: Improving appearance to what may have represented a focal anastomotic leak posterior to the left colon at the level of reanastomosis after colostomy takedown. An area of probable extraluminal air and fluid is smaller and contains less extraluminal air. This is likely consistent with resolving abscess. Some residual inflammation remains in this region. No associated bowel obstruction or free air.    06/09/2018 Imaging    CT CAP WO contrast  IMPRESSION: Stable exam. No evidence of recurrent or metastatic carcinoma within the chest, abdomen, or pelvis.  Aortic Atherosclerosis (ICD10-I70.0). Coronary artery atherosclerosis.      CURRENT THERAPY:  Surveillance  INTERVAL HISTORY:  Christopher [name] is here for a follow up of colon cancer. He was able to identify himself by birth date. He notes he is doing well. He denies chest pain. He notes he last saw his cardiologist in 03/2018. He notes he had constipation last month and prune juice helped him, now resolved. During that he lost 10 pounds. He denies any pain or any other concerns.    REVIEW OF SYSTEMS:   Constitutional: Denies fevers, chills (+) weight loss Eyes: Denies blurriness of vision Ears, nose, mouth, throat, and face: Denies mucositis or sore throat Respiratory: Denies cough, dyspnea or wheezes Cardiovascular: Denies palpitation, chest discomfort or lower extremity swelling Gastrointestinal:  Denies nausea, heartburn or change in bowel habits Skin: Denies abnormal skin rashes Lymphatics: Denies new lymphadenopathy or easy bruising Neurological:Denies numbness, tingling or new weaknesses Behavioral/Psych: Mood is stable, no new changes  All other  systems were reviewed with the patient and are negative.  MEDICAL HISTORY:  Past Medical History:  Diagnosis Date  . Adenocarcinoma of descending colon  pT3, pN1a, pMX s/p colectomy/ostomy 05/18/2016 05/26/2016  . AICD (automatic cardioverter/defibrillator) present    09/07/13 Dr. Lovena Le post MI with EF 30% RV lead      Medtronic 6947 (serial number [id number]) Generator   Medtronic  Evera XT VR (serial Number [id number])   . Anemia   . Anemia due to blood loss 05/03/2016  . Anxiety   . Anxiety state 01/17/2015  . Automatic implantable cardioverter-defibrillator in situ    MDT Aug 2015 Dr. Lovena Le  . Benign neoplasm of cecum   . CAD (coronary artery disease), native coronary artery 07/03/2013   Cath 06/20/13  Normal left main, occluded LAD, occluded RCA, 50% circ EF 15%  3.0 x28 and 3.0 x 8 mm Xience stent Dr. Tamala Julian  To LAD   . Cancer ([place])   . Cardiomyopathy, ischemic   . Chronic anticoagulation 06/12/2017  . Chronic kidney disease   . Chronic systolic CHF (congestive heart failure) (HCC)    ECHO 08/05/13  EF 30%  Anterior akinesis and inferior hypokinesis   . COPD (chronic obstructive pulmonary disease) ([place])   . Essential hypertension 05/16/2016  . Financial difficulties 10/01/2013  . History of colon cancer   . Hyperlipidemia   .  Intra-abdominal abscess 06/12/2017  . Iron deficiency anemia due to chronic blood loss 07/02/2016  . Mass of colon 04/2016  . Myocardial infarction ([place]) 04/29/2013  . Old anterior myocardial infarction 06/20/2013   per pt 12/22/2014 had an MI  . Partial bowel obstruction ([place]) 05/16/2016  . S/P colostomy takedown 05/20/2017  . Thrombocytopenia ([place]) 12/27/2014   Chronic    . Tobacco use 07/03/2013    SURGICAL HISTORY: Past Surgical History:  Procedure Laterality Date  . CARDIAC CATHETERIZATION  05/2013   . CARDIAC CATHETERIZATION N/A 12/28/2014   Procedure: Left Heart Cath and Coronary Angiography;  Surgeon: [name], MD;  Location: [place] [facility];   Service: Cardiovascular;  Laterality: N/A;  . CARDIAC CATHETERIZATION N/A 12/28/2014   Procedure: Coronary Stent Intervention;  Surgeon: [name], MD;  Location: [place] [facility];  Service: Cardiovascular;  Laterality: N/A;  . COLECTOMY WITH COLOSTOMY CREATION/HARTMANN PROCEDURE Left 05/18/2016   Procedure: COLECTOMY WITH OSTOMY CREATION/HARTMANN PROCEDURE;  Surgeon: [name], MD;  Location: [place];  Service: General;  Laterality: Left;  . COLONOSCOPY    . COLONOSCOPY WITH PROPOFOL N/A 04/01/2017   Procedure: COLONOSCOPY WITH PROPOFOL;  Surgeon: [name], MD;  Location: [facility];  Service: Gastroenterology;  Laterality: N/A;  . COLOSTOMY TAKEDOWN N/A 05/20/2017   Procedure: COLOSTOMY TAKEDOWN;  Surgeon: [name], MD;  Location: [place];  Service: General;  Laterality: N/A;  . CORONARY ANGIOPLASTY  05/2013   . FLEXIBLE SIGMOIDOSCOPY N/A 05/16/2016   Procedure: FLEXIBLE SIGMOIDOSCOPY;  Surgeon: [name], MD;  Location: [place];  Service: Gastroenterology;  Laterality: N/A;  . HAND SURGERY Right    cyst removal  . ICD placement  09/07/2013   . IMPLANTABLE CARDIOVERTER DEFIBRILLATOR IMPLANT N/A 09/07/2013   Procedure: IMPLANTABLE CARDIOVERTER DEFIBRILLATOR IMPLANT;  Surgeon: [name], MD;  Location: [facility] C[facility] LAB;  Service: Cardiovascular;  Laterality: N/A;  . INTRA-AORTIC BALLOON PUMP INSERTION  06/20/2013   Procedure: INTRA-AORTIC BALLOON PUMP INSERTION;  Surgeon: Sinclair Grooms, MD;  Location: [facility] CATH LAB;  Service: Cardiovascular;;  . LEFT HEART CATHETERIZATION WITH CORONARY ANGIOGRAM N/A 06/20/2013   Procedure: LEFT HEART CATHETERIZATION WITH CORONARY ANGIOGRAM;  Surgeon: Sinclair Grooms, MD;  Location: [facility] CATH LAB;  Service: Cardiovascular;  Laterality: N/A;  . PERCUTANEOUS CORONARY STENT INTERVENTION (PCI-S)  06/20/2013   Procedure: PERCUTANEOUS CORONARY STENT INTERVENTION (PCI-S);  Surgeon: Sinclair Grooms, MD;  Location: [facility] CATH LAB;  Service:  Cardiovascular;;    I have reviewed the social history and family history with the patient and they are unchanged from previous note.  ALLERGIES:  is allergic to capecitabine; celebrex [celecoxib]; and contrast media [iodinated diagnostic agents].  MEDICATIONS:  Current Outpatient Medications  Medication Sig Dispense Refill  . aspirin 81 MG chewable tablet Chew 1 tablet (81 mg total) by mouth daily. 30 tablet 5  . atorvastatin (LIPITOR) 80 MG tablet TAKE 1 TABLET BY MOUTH ONCE DAILY AT  6  PM 90 tablet 3  . carvedilol (COREG) 3.125 MG tablet TAKE 1 TABLET BY MOUTH TWICE DAILY WITH MEALS 180 tablet 1  . clopidogrel (PLAVIX) 75 MG tablet Take 1 tablet (75 mg total) by mouth daily. 90 tablet 1  . digoxin (LANOXIN) 0.125 MG tablet Take 1 tablet (0.125 mg total) by mouth daily. 90 tablet 3  . ferrous sulfate 325 (65 FE) MG tablet Take 325 mg by mouth daily.    Marland Kitchen lisinopril (PRINIVIL,ZESTRIL) 2.5 MG tablet Take  1 tablet (2.5 mg total) by mouth daily. 90 tablet 2  . Multiple Vitamin (MULTIVITAMIN WITH MINERALS) TABS tablet Take 1 tablet by mouth daily. 30 tablet 0  . nitroGLYCERIN (NITROSTAT) 0.4 MG SL tablet Place 1 tablet (0.4 mg total) under the tongue every 5 (five) minutes as needed for chest pain. 25 tablet 6  . PEG-KCl-NaCl-NaSulf-Na Asc-C (PLENVU) 140 g SOLR Take 1 kit by mouth as directed. (Patient not taking: Reported on 04/11/2018) 1 each 0  . saccharomyces boulardii (FLORASTOR) 250 MG capsule Take 1 capsule (250 mg total) by mouth 2 (two) times daily. 30 capsule 0   No current facility-administered medications for this visit.     PHYSICAL EXAMINATION: ECOG PERFORMANCE STATUS: 0 - Asymptomatic  No vitals taken today, Exam not performed today  LABORATORY DATA:  I have reviewed the data as listed CBC Latest Ref Rng & Units 06/09/2018 02/06/2018 10/31/2017  WBC 4.0 - 10.5 K/uL 6.7 6.7 7.5  Hemoglobin 13.0 - 17.0 g/dL 13.4 13.7 13.8  Hematocrit 39.0 - 52.0 % 40.6 41.6 41.6  Platelets  150 - 400 K/uL 126(L) 131(L) 117(L)     CMP Latest Ref Rng & Units 06/09/2018 02/06/2018 10/31/2017  Glucose 70 - 99 mg/dL 106(H) 99 95  BUN 6 - 20 mg/dL 22(H) 24(H) 25(H)  Creatinine 0.61 - 1.24 mg/dL 1.19 1.18 1.29(H)  Sodium 135 - 145 mmol/L 139 140 141  Potassium 3.5 - 5.1 mmol/L 4.4 4.9 4.6  Chloride 98 - 111 mmol/L 105 107 106  CO2 22 - 32 mmol/L 24 24 28   Calcium 8.9 - 10.3 mg/dL 8.8(L) 9.7 9.7  Total Protein 6.5 - 8.1 g/dL 7.0 7.1 7.0  Total Bilirubin 0.3 - 1.2 mg/dL 0.2(L) 0.5 0.3  Alkaline Phos 38 - 126 U/L 53 61 58  AST 15 - 41 U/L 24 23 19   ALT 0 - 44 U/L 32 27 20      RADIOGRAPHIC STUDIES: I have personally reviewed the radiological images as listed and agreed with the findings in the report. No results found.   ASSESSMENT & PLAN:  REMBERTO LIENHARD is a 58 y.o. male with   1. Invasive Colorectal Adenocarcinoma of the Left Colon, pT3N1aM0, Stage IIIB, MSI-stable  -Diagnosed in 04/2016. Treated with left hemicolectomy and adjuvant chemo. He is currently on surveillance. -He states that he previously had a bad reaction to CT iv contrast. Will avoid in the future.  -We discussed his Surveillance CT CAP from 06/09/18 which showed no evidence of recurrence or metastatic carcinoma, I personally reviewed the images. Does show incidental finding of coronary artery atherosclerosis.  -From a cancer standpoint he is clinically doing very well, stable and asymptomatic. Last week's labs reviewed, CBC, CMP, Iron panel and CEA WNL except PLT 126K, BUN 22, Ca 8.8. There is no clinical concern for recurrence. -It has been 2 years since his diagnosis. The further away from diagnosis, the lower his risk for recurrence. Continue colon cancer surveillance, a total of 5 years. -F/u every 4 months this year then every 6 months from next year   2. CAD and systolic CHF with EF 33-35% -s/p 2 stent placements and a pace maker. -f/u with cardiology -His CT CAP from 06/09/18 showed incidental finding  of coronary artery atherosclerosis. I recommend him to discuss with cardiology to see if he needs stress test   3. COPD -He previously quit smoking -Stable  4. Planter pain  -He noticed intermittent planter pain when he walks, but denies toe pain  or skin rash or breakdown -We will monitor clinically. -Mild. He plans to see podiatrist about this.   Plan  -Scan and lab reviewed, NED  -Lab and f/u in 4 months    No problem-specific Assessment & Plan notes found for this encounter.   No orders of the defined types were placed in this encounter.  I discussed the assessment and treatment plan with the patient. The patient was provided an opportunity to ask questions and all were answered. The patient agreed with the plan and demonstrated an understanding of the instructions.  The patient was advised to call back or seek an in-person evaluation if the symptoms worsen or if the condition fails to improve as anticipated.  I provided 15 minutes of non face-to-face telephone visit time during this encounter, and > 50% was spent counseling as documented under my assessment & plan.    Christopher Merle, MD 06/11/2018   I, Joslyn Devon, am acting as scribe for Christopher Merle, MD.   I have reviewed the above documentation for accuracy and completeness, and I agree with the above.

## 2018-06-11 ENCOUNTER — Inpatient Hospital Stay (HOSPITAL_BASED_OUTPATIENT_CLINIC_OR_DEPARTMENT_OTHER): Payer: Medicare Other | Admitting: Hematology

## 2018-06-11 ENCOUNTER — Telehealth: Payer: Self-pay | Admitting: Hematology

## 2018-06-11 ENCOUNTER — Encounter: Payer: Self-pay | Admitting: Hematology

## 2018-06-11 DIAGNOSIS — C186 Malignant neoplasm of descending colon: Secondary | ICD-10-CM | POA: Diagnosis not present

## 2018-06-11 NOTE — Telephone Encounter (Signed)
Scheduled appt 5/13 los.  A calendar will be mailed out.

## 2018-06-12 ENCOUNTER — Telehealth: Payer: Self-pay | Admitting: *Deleted

## 2018-06-12 ENCOUNTER — Encounter: Payer: Self-pay | Admitting: Hematology

## 2018-06-12 NOTE — Telephone Encounter (Signed)
° ° °  Patient declined appointment for 5/15 and cancelled 5/29 appt

## 2018-06-12 NOTE — Telephone Encounter (Signed)
Pt has appointment with Dr. Angelena Form on 5/29.  I placed call to him to see if he wanted telemedicine visit tomorrow. Left message to call office.

## 2018-06-25 ENCOUNTER — Ambulatory Visit (INDEPENDENT_AMBULATORY_CARE_PROVIDER_SITE_OTHER): Payer: Medicare Other | Admitting: *Deleted

## 2018-06-25 DIAGNOSIS — I255 Ischemic cardiomyopathy: Secondary | ICD-10-CM | POA: Diagnosis not present

## 2018-06-25 LAB — CUP PACEART REMOTE DEVICE CHECK
Battery Remaining Longevity: 90 mo
Battery Voltage: 2.98 V
Brady Statistic RV Percent Paced: 0.01 %
Date Time Interrogation Session: 20200527101133
HighPow Impedance: 69 Ohm
Implantable Lead Implant Date: 20150810
Implantable Lead Location: 753860
Implantable Lead Model: 6935
Implantable Pulse Generator Implant Date: 20150810
Lead Channel Impedance Value: 361 Ohm
Lead Channel Impedance Value: 456 Ohm
Lead Channel Pacing Threshold Amplitude: 0.5 V
Lead Channel Pacing Threshold Pulse Width: 0.4 ms
Lead Channel Sensing Intrinsic Amplitude: 7.5 mV
Lead Channel Sensing Intrinsic Amplitude: 7.5 mV
Lead Channel Setting Pacing Amplitude: 2 V
Lead Channel Setting Pacing Pulse Width: 0.4 ms
Lead Channel Setting Sensing Sensitivity: 0.3 mV

## 2018-06-26 ENCOUNTER — Telehealth: Payer: Self-pay

## 2018-06-26 NOTE — Telephone Encounter (Signed)
Left message, would like to offer [facility] colonoscopy on 07-14-18

## 2018-06-26 NOTE — Telephone Encounter (Signed)
Patient would like to wait until July to schedule.

## 2018-06-26 NOTE — Telephone Encounter (Signed)
Noted  

## 2018-06-27 ENCOUNTER — Telehealth: Payer: Medicare Other | Admitting: Cardiovascular Disease

## 2018-07-04 ENCOUNTER — Encounter: Payer: Self-pay | Admitting: Cardiology

## 2018-07-04 NOTE — Progress Notes (Signed)
Remote ICD transmission.   

## 2018-07-11 ENCOUNTER — Other Ambulatory Visit: Payer: Self-pay | Admitting: Cardiology

## 2018-07-11 ENCOUNTER — Other Ambulatory Visit: Payer: Self-pay | Admitting: Cardiovascular Disease

## 2018-07-11 MED ORDER — LISINOPRIL 2.5 MG PO TABS: 2.5000 mg | ORAL_TABLET | Freq: Every day | ORAL | 3 refills | Status: DC

## 2018-07-11 NOTE — Telephone Encounter (Signed)
°*  STAT* If patient is at the pharmacy, call can be transferred to refill team.   1. Which medications need to be refilled? (please list name of each medication and dose if known) Lisinopril 2.5mg  tablet once daily  2. Which pharmacy/location (including street and city if local pharmacy) is medication to be sent to? [address] [place]  3. Do they need a 30 day or 90 day supply? [place]

## 2018-07-11 NOTE — Telephone Encounter (Signed)
Rx sent by Dr Angelena Form as he no longer is seeing Dr Bettina Gavia.

## 2018-07-11 NOTE — Telephone Encounter (Signed)
This is Dr. Munley's pt 

## 2018-07-11 NOTE — Telephone Encounter (Signed)
 *  STAT* If patient is at the pharmacy, call can be transferred to refill team.   1. Which medications need to be refilled? (please list name of each medication and dose if known) lisinopril (PRINIVIL,ZESTRIL) 2.5 MG tablet  2. Which pharmacy/location (including street and city if local pharmacy) is medication to be sent to? [facility] on [address]  3. Do they need a 30 day or 90 day supply? 90 days

## 2018-07-11 NOTE — Telephone Encounter (Signed)
Pt's medication was sent to pt's pharmacy as requested. Confirmation received.  °

## 2018-07-14 ENCOUNTER — Telehealth: Payer: Self-pay | Admitting: Cardiovascular Disease

## 2018-07-14 ENCOUNTER — Other Ambulatory Visit: Payer: Self-pay | Admitting: Cardiovascular Disease

## 2018-07-14 MED ORDER — LISINOPRIL 2.5 MG PO TABS: 2.5000 mg | ORAL_TABLET | Freq: Every day | ORAL | 2 refills | Status: DC

## 2018-07-14 NOTE — Telephone Encounter (Signed)
New Message      *STAT* If patient is at the pharmacy, call can be transferred to refill team.   1. Which medications need to be refilled? (please list name of each medication and dose if known) Lisinopril   2. Which pharmacy/location (including street and city if local pharmacy) is medication to be sent to? [facility] on [address]   3. Do they need a 30 day or 90 day supply? 90 days

## 2018-07-14 NOTE — Telephone Encounter (Signed)
New Message      Pt would like for Fraser Din to call him He says you can schedule him for anytime in August and if he doesn't answer, to please leave a message

## 2018-07-14 NOTE — Telephone Encounter (Signed)
I scheduled an appointment with Dr. Angelena Form for September 04, 2018 at 2:20.  Appointment information left on pt's voicemail and sent through my chart.

## 2018-07-14 NOTE — Telephone Encounter (Signed)
Pt's medication was sent to pt's pharmacy as requested. Confirmation received.  °

## 2018-07-29 NOTE — Telephone Encounter (Signed)
L/m to colonoscopy.

## 2018-07-30 NOTE — Telephone Encounter (Signed)
Understood.  Then we will wait for him to call us and decide when he is ready for colonoscopy. Appreciate you reaching out to him.  - HD

## 2018-07-30 NOTE — Telephone Encounter (Signed)
Due to the increasing numbers of COVID 19 pt is still hesitant about going forward with the colonoscopy at this time. He wants [place] to reach out in Aug and see where things stand.

## 2018-07-30 NOTE — Telephone Encounter (Signed)
Left message to call regarding scheduling a colonoscopy on 08-18-18 at [place]

## 2018-07-30 NOTE — Telephone Encounter (Signed)
Pt returned your call.  

## 2018-08-21 ENCOUNTER — Other Ambulatory Visit: Payer: Self-pay | Admitting: Cardiology

## 2018-08-27 ENCOUNTER — Other Ambulatory Visit: Payer: Self-pay

## 2018-08-27 MED ORDER — CLOPIDOGREL BISULFATE 75 MG PO TABS: 75.0000 mg | ORAL_TABLET | Freq: Every day | ORAL | 3 refills | Status: DC

## 2018-09-04 ENCOUNTER — Encounter: Payer: Self-pay | Admitting: Cardiovascular Disease

## 2018-09-04 ENCOUNTER — Other Ambulatory Visit: Payer: Self-pay

## 2018-09-04 ENCOUNTER — Ambulatory Visit (INDEPENDENT_AMBULATORY_CARE_PROVIDER_SITE_OTHER): Payer: Medicare Other | Admitting: Cardiovascular Disease

## 2018-09-04 VITALS — BP 112/78 | HR 75 | Ht 67.5 in | Wt 173.2 lb

## 2018-09-04 DIAGNOSIS — I10 Essential (primary) hypertension: Secondary | ICD-10-CM | POA: Diagnosis not present

## 2018-09-04 DIAGNOSIS — I2510 Atherosclerotic heart disease of native coronary artery without angina pectoris: Secondary | ICD-10-CM

## 2018-09-04 DIAGNOSIS — E785 Hyperlipidemia, unspecified: Secondary | ICD-10-CM | POA: Diagnosis not present

## 2018-09-04 DIAGNOSIS — I255 Ischemic cardiomyopathy: Secondary | ICD-10-CM

## 2018-09-04 NOTE — Patient Instructions (Signed)

## 2018-09-04 NOTE — Progress Notes (Signed)
Chief Complaint  Patient presents with  . Follow-up    CAD   History of Present Illness: 58 yo male with history of anxiety, CAD with prior stenting of the LAD and obtuse marginal, ischemic cardiomyopathy s/p ICD, chronic kidney disease, COPD, chronic systolic CHF, HTN, hyperlipidemia, former tobacco abuse, iron deficiency anemia, colon cancer s/p colectomy here today to establish care in the [facility] cardiology office. He has been followed in the past by Dr. Wynonia Lawman. Given Dr. Thurman Coyer recent absence due to illness, Christopher [name] was seen in our [facility] Cardiology office by Dr. Bettina Gavia 02/10/18 to establish care there. He has a complex past medical history. He has undergone a colectomy for colon cancer. He was admitted to [facility] May 2015 with a late presenting anterior STEMI. He was found to have chronic total occlusion of the mid RCA and a totally occluded proximal to mid LAD. There was moderate disease in the Circumflex artery. LVEF was 15-20%. Overlapping drug eluting stents were placed in the mid LAD. A balloon pump was placed in the cath lab. ICD implanted 09/07/13. His ICD has been followed in our EP clinic by Dr. Lovena Le. Cardiac cath November 2016 with widely patent stent in the LAD. Chronically occluded RCA with filling from left to right collaterals. There was a severe stenosis in the obtuse marginal branch treated with a drug eluting stent. Echo April 2018 with LVEF=25-30%. No significant valve disease. When he was seen by Dr. Bettina Gavia on 02/10/18 he complained of dyspnea with exertion and isolated episodes of chest pain. He was felt to be stable. His Ace-inh was changed to [facility]. He was angry when he found out the cost of Entresto and refused to go back to that office to be seen. I saw him in the office here at [facility] for the first time in February 2020 for pre-operative clearance prior to a colonoscopy and he was feeling well.   He is here today for follow up. The patient denies any chest  pain, dyspnea, palpitations, lower extremity edema, orthopnea, PND, dizziness, near syncope or syncope.    Primary Care Physician: Patient, No Pcp Per  Past Medical History:  Diagnosis Date  . Adenocarcinoma of descending colon  pT3, pN1a, pMX s/p colectomy/ostomy 05/18/2016 05/26/2016  . AICD (automatic cardioverter/defibrillator) present    09/07/13 Dr. Lovena Le post MI with EF 30% RV lead      Medtronic 6947 (serial number [id number]) Generator   Medtronic  Evera XT VR (serial Number [id number])   . Anemia   . Anemia due to blood loss 05/03/2016  . Anxiety   . Anxiety state 01/17/2015  . Automatic implantable cardioverter-defibrillator in situ    MDT Aug 2015 Dr. Lovena Le  . Benign neoplasm of cecum   . CAD (coronary artery disease), native coronary artery 07/03/2013   Cath 06/20/13  Normal left main, occluded LAD, occluded RCA, 50% circ EF 15%  3.0 x28 and 3.0 x 8 mm Xience stent Dr. Tamala Julian  To LAD   . Cancer ([place])   . Cardiomyopathy, ischemic   . Chronic anticoagulation 06/12/2017  . Chronic kidney disease   . Chronic systolic CHF (congestive heart failure) (HCC)    ECHO 08/05/13  EF 30%  Anterior akinesis and inferior hypokinesis   . COPD (chronic obstructive pulmonary disease) ([place])   . Essential hypertension 05/16/2016  . Financial difficulties 10/01/2013  . History of colon cancer   . Hyperlipidemia   . Intra-abdominal abscess 06/12/2017  . Iron deficiency anemia due  to chronic blood loss 07/02/2016  . Mass of colon 04/2016  . Myocardial infarction ([place]) 04/29/2013  . Old anterior myocardial infarction 06/20/2013   per pt 12/22/2014 had an MI  . Partial bowel obstruction ([place]) 05/16/2016  . S/P colostomy takedown 05/20/2017  . Thrombocytopenia ([place]) 12/27/2014   Chronic    . Tobacco use 07/03/2013    Past Surgical History:  Procedure Laterality Date  . CARDIAC CATHETERIZATION  05/2013   . CARDIAC CATHETERIZATION N/A 12/28/2014   Procedure: Left Heart Cath and Coronary Angiography;  Surgeon:  [name], MD;  Location: [place] CV LAB;  Service: Cardiovascular;  Laterality: N/A;  . CARDIAC CATHETERIZATION N/A 12/28/2014   Procedure: Coronary Stent Intervention;  Surgeon: [name], MD;  Location: [place] CV LAB;  Service: Cardiovascular;  Laterality: N/A;  . COLECTOMY WITH COLOSTOMY CREATION/HARTMANN PROCEDURE Left 05/18/2016   Procedure: COLECTOMY WITH OSTOMY CREATION/HARTMANN PROCEDURE;  Surgeon: [name], MD;  Location: [place];  Service: General;  Laterality: Left;  . COLONOSCOPY    . COLONOSCOPY WITH PROPOFOL N/A 04/01/2017   Procedure: COLONOSCOPY WITH PROPOFOL;  Surgeon: [name], MD;  Location: [facility];  Service: Gastroenterology;  Laterality: N/A;  . COLOSTOMY TAKEDOWN N/A 05/20/2017   Procedure: COLOSTOMY TAKEDOWN;  Surgeon: [name], MD;  Location: [place];  Service: General;  Laterality: N/A;  . CORONARY ANGIOPLASTY  05/2013   . FLEXIBLE SIGMOIDOSCOPY N/A 05/16/2016   Procedure: FLEXIBLE SIGMOIDOSCOPY;  Surgeon: [name], MD;  Location: [place];  Service: Gastroenterology;  Laterality: N/A;  . HAND SURGERY Right    cyst removal  . ICD placement  09/07/2013   . IMPLANTABLE CARDIOVERTER DEFIBRILLATOR IMPLANT N/A 09/07/2013   Procedure: IMPLANTABLE CARDIOVERTER DEFIBRILLATOR IMPLANT;  Surgeon: [name], MD;  Location: [facility] CATH LAB;  Service: Cardiovascular;  Laterality: N/A;  . INTRA-AORTIC BALLOON PUMP INSERTION  06/20/2013   Procedure: INTRA-AORTIC BALLOON PUMP INSERTION;  Surgeon: Sinclair Grooms, MD;  Location: [facility] CATH LAB;  Service: Cardiovascular;;  . LEFT HEART CATHETERIZATION WITH CORONARY ANGIOGRAM N/A 06/20/2013   Procedure: LEFT HEART CATHETERIZATION WITH CORONARY ANGIOGRAM;  Surgeon: Sinclair Grooms, MD;  Location: [facility] CATH LAB;  Service: Cardiovascular;  Laterality: N/A;  . PERCUTANEOUS CORONARY STENT INTERVENTION (PCI-S)  06/20/2013   Procedure: PERCUTANEOUS CORONARY STENT INTERVENTION (PCI-S);  Surgeon:  Sinclair Grooms, MD;  Location: [facility] CATH LAB;  Service: Cardiovascular;;    Current Outpatient Medications  Medication Sig Dispense Refill  . aspirin 81 MG chewable tablet Chew 1 tablet (81 mg total) by mouth daily. 30 tablet 5  . atorvastatin (LIPITOR) 80 MG tablet TAKE 1 TABLET BY MOUTH ONCE DAILY AT  6  PM 90 tablet 3  . carvedilol (COREG) 3.125 MG tablet TAKE 1 TABLET BY MOUTH TWICE DAILY WITH MEALS 180 tablet 2  . clopidogrel (PLAVIX) 75 MG tablet Take 1 tablet (75 mg total) by mouth daily. 90 tablet 3  . digoxin (LANOXIN) 0.125 MG tablet Take 1 tablet (0.125 mg total) by mouth daily. 90 tablet 3  . ferrous sulfate 325 (65 FE) MG tablet Take 325 mg by mouth daily.    Marland Kitchen lisinopril (ZESTRIL) 2.5 MG tablet Take 1 tablet (2.5 mg total) by mouth daily. 90 tablet 2  . Multiple Vitamin (MULTIVITAMIN WITH MINERALS) TABS tablet Take 1 tablet by mouth daily. 30 tablet 0  . nitroGLYCERIN (NITROSTAT) 0.4 MG SL tablet Place 1 tablet (0.4 mg total) under the tongue every 5 (  five) minutes as needed for chest pain. 25 tablet 6  . PEG-KCl-NaCl-NaSulf-Na Asc-C (PLENVU) 140 g SOLR Take 1 kit by mouth as directed. 1 each 0  . saccharomyces boulardii (FLORASTOR) 250 MG capsule Take 1 capsule (250 mg total) by mouth 2 (two) times daily. 30 capsule 0   No current facility-administered medications for this visit.     Allergies  Allergen Reactions  . Capecitabine Other (See Comments)    Server dehydration and diarrhea      . Celebrex [Celecoxib] Other (See Comments)    Caused his back to burn  . Contrast Media [Iodinated Diagnostic Agents] Other (See Comments)    Patient feels weakness    Social History   Socioeconomic History  . Marital status: Single    Spouse name: Not on file  . Number of children: 0  . Years of education: 12  . Highest education level: Not on file  Occupational History  . Occupation: Disabled  Social Needs  . Financial resource strain: Not on file  . Food insecurity     Worry: Not on file    Inability: Not on file  . Transportation needs    Medical: Not on file    Non-medical: Not on file  Tobacco Use  . Smoking status: Former Smoker    Packs/day: 2.00    Years: 40.00    Pack years: 80.00    Types: Cigarettes    Quit date: 12/22/2014    Years since quitting: 3.7  . Smokeless tobacco: Never Used  Substance and Sexual Activity  . Alcohol use: No    Comment: quit in 08/2013, he used to drink alcohol moderate (1 pine liquor one week)  for 30 years   . Drug use: No  . Sexual activity: Not Currently    Birth control/protection: Condom    Comment: men   Lifestyle  . Physical activity    Days per week: Not on file    Minutes per session: Not on file  . Stress: Not on file  Relationships  . Social connections    Talks on phone: Not on file    Gets together: Not on file    Attends religious service: Not on file    Active member of club or organization: Not on file    Attends meetings of clubs or organizations: Not on file    Relationship status: Not on file  . Intimate partner violence    Fear of current or ex partner: Not on file    Emotionally abused: Not on file    Physically abused: Not on file    Forced sexual activity: Not on file  Other Topics Concern  . Not on file  Social History Narrative   Lives alone.    In a house trying to sell his home. Exercise: No.    Family History  Problem Relation Age of Onset  . Emphysema Father   . Lung disease Father   . Heart disease Mother   . Dementia Mother   . Diabetes Mother   . CVA Mother   . Alzheimer's disease Mother   . Pancreatic cancer Maternal Grandmother   . Lung cancer Maternal Grandmother   . Cancer Neg Hx     Review of Systems:  As stated in the HPI and otherwise negative.   BP 112/78   Pulse 75   Ht 5' 7.5" (1.715 m)   Wt 173 lb 3.2 oz (78.6 kg)   SpO2 98%   BMI 26.73   kg/m   Physical Examination:  General: Well developed, well nourished, NAD  HEENT: OP clear,  mucus membranes moist  SKIN: warm, dry. No rashes. Neuro: No focal deficits  Musculoskeletal: Muscle strength 5/5 all ext  Psychiatric: Mood and affect normal  Neck: No JVD, no carotid bruits, no thyromegaly, no lymphadenopathy.  Lungs:Clear bilaterally, no wheezes, rhonci, crackles Cardiovascular: Regular rate and rhythm. No murmurs, gallops or rubs. Abdomen:Soft. Bowel sounds present. Non-tender.  Extremities: No lower extremity edema. Pulses are 2 + in the bilateral DP/PT.  Echo April 2018: - Left ventricle: The cavity size was mildly dilated. Wall   thickness was normal. Systolic function was severely reduced. The   estimated ejection fraction was in the range of 25% to 30%.   Akinesis of the anteroseptal, anterior, and apical myocardium.   Doppler parameters are consistent with abnormal left ventricular   relaxation (grade 1 diastolic dysfunction).  EKG:  EKG is not ordered today. The ekg ordered today demonstrates  Recent Labs: 02/10/2018: NT-Pro BNP 779 06/09/2018: ALT 32; BUN 22; Creatinine, Ser 1.19; Hemoglobin 13.4; Platelets 126; Potassium 4.4; Sodium 139   Lipid Panel    Component Value Date/Time   CHOL 128 02/10/2018 0900   TRIG 146 02/10/2018 0900   HDL 36 (L) 02/10/2018 0900   CHOLHDL 3.6 02/10/2018 0900   CHOLHDL 3.9 12/23/2014 0331   VLDL 13 12/23/2014 0331   LDLCALC 63 02/10/2018 0900     Wt Readings from Last 3 Encounters:  09/04/18 173 lb 3.2 oz (78.6 kg)  04/11/18 177 lb 6.4 oz (80.5 kg)  03/07/18 176 lb (79.8 kg)    Assessment and Plan:   1. CAD without angina: No chest pain. Last cath in 2016. No worrisome features for unstable angina. Continue ASA, Plavix, statin and beta blocker.    2. Ischemic cardiomyopathy: LVEF=30% by echo in 2018. Weight stable. No LE edema. ICD in place and followed by Dr. Taylor. Continue Coreg and Lisinopril. He has refused to consider Entresto due to cost.   3. HTN: BP controlled. Continue current therapy  4.  Hyperlipidemia: Lipids well controlled in January 2020. Continue statin.    5. Former tobacco abuse  Current medicines are reviewed at length with the patient today.  The patient does not have concerns regarding medicines.  The following changes have been made:  no change  Follow up with me in 12 month.   Labs/ tests ordered today include:   No orders of the defined types were placed in this encounter.   Signed,  , MD 09/04/2018 3:01 PM    [facility] [address], [place],   [address] Phone: [phone]; Fax: [phone]  

## 2018-09-11 NOTE — Telephone Encounter (Signed)
Left message to call.

## 2018-09-24 ENCOUNTER — Telehealth: Payer: Self-pay

## 2018-09-24 ENCOUNTER — Ambulatory Visit (INDEPENDENT_AMBULATORY_CARE_PROVIDER_SITE_OTHER): Payer: Medicare Other | Admitting: *Deleted

## 2018-09-24 DIAGNOSIS — I255 Ischemic cardiomyopathy: Secondary | ICD-10-CM

## 2018-09-24 DIAGNOSIS — I5022 Chronic systolic (congestive) heart failure: Secondary | ICD-10-CM

## 2018-09-24 LAB — CUP PACEART REMOTE DEVICE CHECK
Battery Remaining Longevity: 87 mo
Battery Voltage: 2.97 V
Brady Statistic RV Percent Paced: 0.01 %
Date Time Interrogation Session: 20200826093425
HighPow Impedance: 72 Ohm
Implantable Lead Implant Date: 20150810
Implantable Lead Location: 753860
Implantable Lead Model: 6935
Implantable Pulse Generator Implant Date: 20150810
Lead Channel Impedance Value: 399 Ohm
Lead Channel Impedance Value: 513 Ohm
Lead Channel Pacing Threshold Amplitude: 0.5 V
Lead Channel Pacing Threshold Pulse Width: 0.4 ms
Lead Channel Sensing Intrinsic Amplitude: 6.125 mV
Lead Channel Sensing Intrinsic Amplitude: 6.125 mV
Lead Channel Setting Pacing Amplitude: 2 V
Lead Channel Setting Pacing Pulse Width: 0.4 ms
Lead Channel Setting Sensing Sensitivity: 0.3 mV

## 2018-09-24 NOTE — Telephone Encounter (Addendum)
LMOVM, calling to assess if pt symptomatic w/ increasing optivol.  Spoke to pt regarding increased optivol, states he does not have any swelling and is not short of breath.

## 2018-09-24 NOTE — Telephone Encounter (Signed)
No change. GT 

## 2018-09-30 NOTE — Telephone Encounter (Signed)
See MyChart message from 09/30/18.

## 2018-10-02 NOTE — Progress Notes (Signed)
Remote ICD transmission.   

## 2018-10-07 NOTE — Telephone Encounter (Signed)
Mr Barb still wants to postpone his colonoscopy at this time. Will check up with him next week.

## 2018-10-09 NOTE — Progress Notes (Signed)
[place]   [phone] [phone]   Clinic Follow up Note   Patient Care Team: Patient, No Pcp Per as PCP - General (Center) Burnell Blanks, MD as PCP - Cardiology (Cardiology) [name], MD as Consulting Physician (Cardiology) [name], MD as Consulting Physician (Cardiology) [name], MD as Consulting Physician (Hematology) [name], MD as Consulting Physician (General Surgery)  Date of Service:  10/13/2018  CHIEF COMPLAINT: F/u on left side colon cancer, stage IIIB  SUMMARY OF ONCOLOGIC HISTORY: Oncology History Overview Note  Cancer Staging Adenocarcinoma of descending colon  pT3, pN1a, pMX s/p colectomy/ostomy 05/18/2016 Staging form: Colon and Rectum, AJCC 8th Edition - Pathologic stage from 05/18/2016: Stage IIIB (pT3, pN1a, cM0) - Signed by [name], MD on 07/01/2016     Adenocarcinoma of descending colon  pT3, pN1a, pMX s/p colectomy/ostomy 05/18/2016  05/09/2016 Imaging   CT chest, abdomen and pelvis showed probable descending colon carcinoma. No evidence of distant metastasis. 7 cm hypervascular focus in the upper liver, not typical for metastatic disease.   05/16/2016 Procedure   Colonoscopy showed a large mass which completely obstructss the distal descending colon. Biopsied. Diverticulosis in the left colon.   05/18/2016 Surgery   Left hemicolectomy by Dr. [name]   05/18/2016 Pathology Results   Left colon segmental resection shows invasive colorectal adenocarcinoma, 4.6 cm, tumor extends into subserosa connective tissue, margins are negative, metastatic carcinoma in 1 of 12 lymph nodes. Grade 2, lymphovascular invasion present, perineural invasion negative.   05/18/2016 Miscellaneous   MSI-stable    05/18/2016 Initial Diagnosis   Adenocarcinoma of descending colon  pT3, pN1a, pMX s/p colectomy/ostomy 05/18/2016   07/16/2016 - 08/22/2016 Chemotherapy   Xeloda twice a day for 2 weeks on and  1 week off.  stopped after 3 days due to severe diarrhea, and changed to 1580m bid, started on 07/31/2016; Hold Xeloda starting 08/22/16 due to AKI. Will lower Xeloda to 3 in the morning and 2 in the evening when restart next cycle.   Stopped due to poor tolerance of sever diarrhea.     01/14/2017 Imaging   CT CAP W Contrast 01/14/17  IMPRESSION: 1. Stable exam. No new or progressive findings in the chest, abdomen, or pelvis. The tiny hypervascular focus in the dome of the liver seen on the previous study is not evident on today's exam. No evidence for metastatic disease on today's CT scan. 2.  Aortic Atherosclerois (ICD10-170.0) 3. Distal transverse end colostomy. 4.  Emphysema. (ICD10-J43.9)   04/01/2017 Procedure   04/01/2017 - One 6 mm polyp in the cecum, removed with a cold snare. Resected and retrieved. - Diverticulosis in the left colon.   04/01/2017 Pathology Results   Colon, polyp(s), cecal cold snare - TUBULAR ADENOMA (ONE FRAGMENT). - NO HIGH GRADE DYSPLASIA OR MALIGNANCY.   05/20/2017 Surgery   Surgery:05/20/2017 COLOSTOMY TAKEDOWN BGeorganna SkeansMD    05/20/2017 Pathology Results   Pathology: 05/20/2017 Stoma - FINDINGS CONSISTENT WITH OSTOMY. - NO DYSPLASIA OR MALIGNANCY.    06/02/2017 Imaging   06/02/2017 CT AP IMPRESSION: 1. Postsurgical wounds in the anterior abdominal wall with skin staples and edema. No dehiscence or discrete fluid collection. 2. Partial colectomy with anastomosis in left hemiabdomen. Mild edema surrounding site of anastomosis, probably postsurgical changes. 3. No abscess, bowel wall thickening, or obstruction identified.   06/12/2017 Imaging   06/12/2017 CT AP IMPRESSION: Improving appearance to what may have represented a focal anastomotic leak posterior to the left  colon at the level of reanastomosis after colostomy takedown. An area of probable extraluminal air and fluid is smaller and contains less extraluminal air. This is likely  consistent with resolving abscess. Some residual inflammation remains in this region. No associated bowel obstruction or free air.   06/09/2018 Imaging   CT CAP WO contrast  IMPRESSION: Stable exam. No evidence of recurrent or metastatic carcinoma within the chest, abdomen, or pelvis.  Aortic Atherosclerosis (ICD10-I70.0). Coronary artery atherosclerosis.      CURRENT THERAPY:  Surveillance  INTERVAL HISTORY:  Christopher [name] is here for a follow up colon cancer. He presents to the clinic alone. He notes he is doing well. He notes his left foot is doing better lately. This has been ongoing for the past 8 months. He denies abdominal issues. He denies bloody or abdnormal stool. He notes he stopped taking ferrous sulfate as it caused constipation. He notes he is on Plavix since 2015 and only has very mild bruising. He denies any overt bleeding.    REVIEW OF SYSTEMS:   Constitutional: Denies fevers, chills or abnormal weight loss Eyes: Denies blurriness of vision Ears, nose, mouth, throat, and face: Denies mucositis or sore throat Respiratory: Denies cough, dyspnea or wheezes Cardiovascular: Denies palpitation, chest discomfort or lower extremity swelling Gastrointestinal:  Denies nausea, heartburn or change in bowel habits Skin: Denies abnormal skin rashes Lymphatics: Denies new lymphadenopathy or easy bruising Neurological:Denies numbness, tingling or new weaknesses Behavioral/Psych: Mood is stable, no new changes  All other systems were reviewed with the patient and are negative.  MEDICAL HISTORY:  Past Medical History:  Diagnosis Date  . Adenocarcinoma of descending colon  pT3, pN1a, pMX s/p colectomy/ostomy 05/18/2016 05/26/2016  . AICD (automatic cardioverter/defibrillator) present    09/07/13 Dr. Lovena Le post MI with EF 30% RV lead      Medtronic 6947 (serial number [id number]) Generator   Medtronic  Evera XT VR (serial Number [id number])   . Anemia   . Anemia due to blood  loss 05/03/2016  . Anxiety   . Anxiety state 01/17/2015  . Automatic implantable cardioverter-defibrillator in situ    MDT Aug 2015 Dr. Lovena Le  . Benign neoplasm of cecum   . CAD (coronary artery disease), native coronary artery 07/03/2013   Cath 06/20/13  Normal left main, occluded LAD, occluded RCA, 50% circ EF 15%  3.0 x28 and 3.0 x 8 mm Xience stent Dr. Tamala Julian  To LAD   . Cancer ([place])   . Cardiomyopathy, ischemic   . Chronic anticoagulation 06/12/2017  . Chronic kidney disease   . Chronic systolic CHF (congestive heart failure) (HCC)    ECHO 08/05/13  EF 30%  Anterior akinesis and inferior hypokinesis   . COPD (chronic obstructive pulmonary disease) ([place])   . Essential hypertension 05/16/2016  . Financial difficulties 10/01/2013  . History of colon cancer   . Hyperlipidemia   . Intra-abdominal abscess 06/12/2017  . Iron deficiency anemia due to chronic blood loss 07/02/2016  . Mass of colon 04/2016  . Myocardial infarction ([place]) 04/29/2013  . Old anterior myocardial infarction 06/20/2013   per pt 12/22/2014 had an MI  . Partial bowel obstruction ([place]) 05/16/2016  . S/P colostomy takedown 05/20/2017  . Thrombocytopenia ([place]) 12/27/2014   Chronic    . Tobacco use 07/03/2013    SURGICAL HISTORY: Past Surgical History:  Procedure Laterality Date  . CARDIAC CATHETERIZATION  05/2013   . CARDIAC CATHETERIZATION N/A 12/28/2014   Procedure: Left Heart Cath and Coronary Angiography;  Surgeon: [name], MD;  Location: [facility];  Service: Cardiovascular;  Laterality: N/A;  . CARDIAC CATHETERIZATION N/A 12/28/2014   Procedure: Coronary Stent Intervention;  Surgeon: [name], MD;  Location: [facility];  Service: Cardiovascular;  Laterality: N/A;  . COLECTOMY WITH COLOSTOMY CREATION/HARTMANN PROCEDURE Left 05/18/2016   Procedure: COLECTOMY WITH OSTOMY CREATION/HARTMANN PROCEDURE;  Surgeon: [name], MD;  Location: [facility];  Service: General;  Laterality: Left;  .  COLONOSCOPY    . COLONOSCOPY WITH PROPOFOL N/A 04/01/2017   Procedure: COLONOSCOPY WITH PROPOFOL;  Surgeon: [name], MD;  Location: [facility];  Service: Gastroenterology;  Laterality: N/A;  . COLOSTOMY TAKEDOWN N/A 05/20/2017   Procedure: COLOSTOMY TAKEDOWN;  Surgeon: [name], MD;  Location: [facility];  Service: General;  Laterality: N/A;  . CORONARY ANGIOPLASTY  05/2013   . FLEXIBLE SIGMOIDOSCOPY N/A 05/16/2016   Procedure: FLEXIBLE SIGMOIDOSCOPY;  Surgeon: [name], MD;  Location: [facility];  Service: Gastroenterology;  Laterality: N/A;  . HAND SURGERY Right    cyst removal  . ICD placement  09/07/2013   . IMPLANTABLE CARDIOVERTER DEFIBRILLATOR IMPLANT N/A 09/07/2013   Procedure: IMPLANTABLE CARDIOVERTER DEFIBRILLATOR IMPLANT;  Surgeon: [name], MD;  Location: [facility] CATH LAB;  Service: Cardiovascular;  Laterality: N/A;  . INTRA-AORTIC BALLOON PUMP INSERTION  06/20/2013   Procedure: INTRA-AORTIC BALLOON PUMP INSERTION;  Surgeon: Sinclair Grooms, MD;  Location: [facility] CATH LAB;  Service: Cardiovascular;;  . LEFT HEART CATHETERIZATION WITH CORONARY ANGIOGRAM N/A 06/20/2013   Procedure: LEFT HEART CATHETERIZATION WITH CORONARY ANGIOGRAM;  Surgeon: Sinclair Grooms, MD;  Location: [facility] CATH LAB;  Service: Cardiovascular;  Laterality: N/A;  . PERCUTANEOUS CORONARY STENT INTERVENTION (PCI-S)  06/20/2013   Procedure: PERCUTANEOUS CORONARY STENT INTERVENTION (PCI-S);  Surgeon: Sinclair Grooms, MD;  Location: [facility] CATH LAB;  Service: Cardiovascular;;    I have reviewed the social history and family history with the patient and they are unchanged from previous note.  ALLERGIES:  is allergic to capecitabine; celebrex [celecoxib]; and contrast media [iodinated diagnostic agents].  MEDICATIONS:  Current Outpatient Medications  Medication Sig Dispense Refill  . aspirin 81 MG chewable tablet Chew 1 tablet (81 mg total) by mouth daily. 30 tablet 5  . atorvastatin (LIPITOR) 80 MG  tablet TAKE 1 TABLET BY MOUTH ONCE DAILY AT  6  PM 90 tablet 3  . carvedilol (COREG) 3.125 MG tablet TAKE 1 TABLET BY MOUTH TWICE DAILY WITH MEALS 180 tablet 2  . clopidogrel (PLAVIX) 75 MG tablet Take 1 tablet (75 mg total) by mouth daily. 90 tablet 3  . digoxin (LANOXIN) 0.125 MG tablet Take 1 tablet (0.125 mg total) by mouth daily. 90 tablet 3  . lisinopril (ZESTRIL) 2.5 MG tablet Take 1 tablet (2.5 mg total) by mouth daily. 90 tablet 2  . Multiple Vitamin (MULTIVITAMIN WITH MINERALS) TABS tablet Take 1 tablet by mouth daily. 30 tablet 0  . nitroGLYCERIN (NITROSTAT) 0.4 MG SL tablet Place 1 tablet (0.4 mg total) under the tongue every 5 (five) minutes as needed for chest pain. 25 tablet 6  . PEG-KCl-NaCl-NaSulf-Na Asc-C (PLENVU) 140 g SOLR Take 1 kit by mouth as directed. 1 each 0  . saccharomyces boulardii (FLORASTOR) 250 MG capsule Take 1 capsule (250 mg total) by mouth 2 (two) times daily. 30 capsule 0   No current facility-administered medications for this visit.     PHYSICAL EXAMINATION: ECOG PERFORMANCE STATUS: 0 - Asymptomatic  Vitals:   10/13/18 1259  BP: 113/84  Pulse: 74  Resp: 17  Temp: 98.3 F (36.8 C)  SpO2: 98%   Filed Weights   10/13/18 1259  Weight: 175 lb 8 oz (79.6 kg)    GENERAL:alert, no distress and comfortable SKIN: skin color, texture, turgor are normal, no rashes or significant lesions EYES: normal, Conjunctiva are pink and non-injected, sclera clear  NECK: supple, thyroid normal size, non-tender, without nodularity LYMPH:  no palpable lymphadenopathy in the cervical, axillary  LUNGS: clear to auscultation and percussion with normal breathing effort HEART: regular rate & rhythm and no murmurs and no lower extremity edema ABDOMEN:abdomen soft, non-tender and normal bowel sounds Musculoskeletal:no cyanosis of digits and no clubbing  NEURO: alert & oriented x 3 with fluent speech, no focal motor/sensory deficits  LABORATORY DATA:  I have reviewed the  data as listed CBC Latest Ref Rng & Units 10/13/2018 06/09/2018 02/06/2018  WBC 4.0 - 10.5 K/uL 6.1 6.7 6.7  Hemoglobin 13.0 - 17.0 g/dL 13.8 13.4 13.7  Hematocrit 39.0 - 52.0 % 40.7 40.6 41.6  Platelets 150 - 400 K/uL 105(L) 126(L) 131(L)     CMP Latest Ref Rng & Units 10/13/2018 06/09/2018 02/06/2018  Glucose 70 - 99 mg/dL 108(H) 106(H) 99  BUN 6 - 20 mg/dL 22(H) 22(H) 24(H)  Creatinine 0.61 - 1.24 mg/dL 1.28(H) 1.19 1.18  Sodium 135 - 145 mmol/L 139 139 140  Potassium 3.5 - 5.1 mmol/L 4.8 4.4 4.9  Chloride 98 - 111 mmol/L 105 105 107  CO2 22 - 32 mmol/L 27 24 24   Calcium 8.9 - 10.3 mg/dL 9.7 8.8(L) 9.7  Total Protein 6.5 - 8.1 g/dL 6.9 7.0 7.1  Total Bilirubin 0.3 - 1.2 mg/dL 0.4 0.2(L) 0.5  Alkaline Phos 38 - 126 U/L 58 53 61  AST 15 - 41 U/L 21 24 23   ALT 0 - 44 U/L 22 32 27      RADIOGRAPHIC STUDIES: I have personally reviewed the radiological images as listed and agreed with the findings in the report. No results found.   ASSESSMENT & PLAN:  Christopher [name] is a 58 y.o. male with   1. Invasive Colorectal Adenocarcinoma of the Left Colon, pT3N1aM0, Stage IIIB, MSI-stable -Diagnosed in 04/2016. Treated with left hemicolectomy and adjuvant chemo. He is currently on surveillance. -He states that he previously had a bad reaction toCT ivcontrast.Will avoid in the future.  -We previously discussed his Surveillance CT CAP from 06/09/18 which showed no evidence of recurrence or metastatic carcinoma, I personally reviewed the images. Does show incidental finding of coronary artery atherosclerosis.  -He is doing clincally well. Labs reviewed, CBC and CMP WNL except PLT 105K, Cr 1.28. CEA and iron panel still pending. Physical exam unremarkable. There is no clinical concern for recurrence. -He is on Plavix. Given very mild thrombocytopenia, no overt bleeding only very mild bruising. He is fine to continue Plavix.  -He is fine to d/c oral iron now.  -He is about 2.5 years since his  diagnosis. The further away from diagnosis, the lower his risk for recurrence. Plan for last surveillence scan in 2021. Continue colon cancer surveillance,a total of 5 years. -F/u in 4 months  -He opted to receive flu shot today.    2. CAD and systolic CHF with EF 25-42% -s/p 2 stent placements and a pace maker. -f/u with cardiology -His CT CAP from 06/09/18 showed incidental finding of coronary artery atherosclerosis. I recommend him to discuss with cardiology to see  if he needs stress test   3. COPD -He previously quit smoking -Stable  4. Planter pain   -He noticed intermittent planter pain when he walks, but denies toe pain or skin rash or breakdown -resolved now   Plan -Proceed with flu shot today.  -Lab and f/u in 4 months   No problem-specific Assessment & Plan notes found for this encounter.   No orders of the defined types were placed in this encounter.  All questions were answered. The patient knows to call the clinic with any problems, questions or concerns. No barriers to learning was detected. I spent 15 minutes counseling the patient face to face. The total time spent in the appointment was 20 minutes and more than 50% was on counseling and review of test results     [name], MD 10/13/2018   I, Joslyn Devon, am acting as scribe for [name], MD.   I have reviewed the above documentation for accuracy and completeness, and I agree with the above.

## 2018-10-13 ENCOUNTER — Other Ambulatory Visit: Payer: Self-pay

## 2018-10-13 ENCOUNTER — Encounter: Payer: Self-pay | Admitting: Hematology

## 2018-10-13 ENCOUNTER — Telehealth: Payer: Self-pay | Admitting: Hematology

## 2018-10-13 ENCOUNTER — Inpatient Hospital Stay: Payer: Medicare Other | Attending: Hematology

## 2018-10-13 ENCOUNTER — Inpatient Hospital Stay (HOSPITAL_BASED_OUTPATIENT_CLINIC_OR_DEPARTMENT_OTHER): Payer: Medicare Other | Admitting: Hematology

## 2018-10-13 VITALS — BP 113/84 | HR 74 | Temp 98.3°F | Resp 17 | Ht 67.5 in | Wt 175.5 lb

## 2018-10-13 DIAGNOSIS — C186 Malignant neoplasm of descending colon: Secondary | ICD-10-CM | POA: Insufficient documentation

## 2018-10-13 DIAGNOSIS — K5900 Constipation, unspecified: Secondary | ICD-10-CM | POA: Diagnosis not present

## 2018-10-13 DIAGNOSIS — Z86010 Personal history of colonic polyps: Secondary | ICD-10-CM | POA: Insufficient documentation

## 2018-10-13 DIAGNOSIS — Z886 Allergy status to analgesic agent status: Secondary | ICD-10-CM | POA: Insufficient documentation

## 2018-10-13 DIAGNOSIS — Z87891 Personal history of nicotine dependence: Secondary | ICD-10-CM | POA: Diagnosis not present

## 2018-10-13 DIAGNOSIS — Z23 Encounter for immunization: Secondary | ICD-10-CM | POA: Diagnosis not present

## 2018-10-13 DIAGNOSIS — I5022 Chronic systolic (congestive) heart failure: Secondary | ICD-10-CM | POA: Insufficient documentation

## 2018-10-13 DIAGNOSIS — R197 Diarrhea, unspecified: Secondary | ICD-10-CM | POA: Insufficient documentation

## 2018-10-13 DIAGNOSIS — E785 Hyperlipidemia, unspecified: Secondary | ICD-10-CM | POA: Insufficient documentation

## 2018-10-13 DIAGNOSIS — C189 Malignant neoplasm of colon, unspecified: Secondary | ICD-10-CM

## 2018-10-13 DIAGNOSIS — I2510 Atherosclerotic heart disease of native coronary artery without angina pectoris: Secondary | ICD-10-CM | POA: Diagnosis not present

## 2018-10-13 DIAGNOSIS — J449 Chronic obstructive pulmonary disease, unspecified: Secondary | ICD-10-CM | POA: Insufficient documentation

## 2018-10-13 DIAGNOSIS — I252 Old myocardial infarction: Secondary | ICD-10-CM | POA: Diagnosis not present

## 2018-10-13 DIAGNOSIS — I700 Atherosclerosis of aorta: Secondary | ICD-10-CM | POA: Diagnosis not present

## 2018-10-13 DIAGNOSIS — I255 Ischemic cardiomyopathy: Secondary | ICD-10-CM | POA: Insufficient documentation

## 2018-10-13 DIAGNOSIS — Z7902 Long term (current) use of antithrombotics/antiplatelets: Secondary | ICD-10-CM | POA: Diagnosis not present

## 2018-10-13 DIAGNOSIS — Z933 Colostomy status: Secondary | ICD-10-CM | POA: Insufficient documentation

## 2018-10-13 DIAGNOSIS — Z7901 Long term (current) use of anticoagulants: Secondary | ICD-10-CM | POA: Diagnosis not present

## 2018-10-13 DIAGNOSIS — N179 Acute kidney failure, unspecified: Secondary | ICD-10-CM | POA: Insufficient documentation

## 2018-10-13 DIAGNOSIS — Z9049 Acquired absence of other specified parts of digestive tract: Secondary | ICD-10-CM | POA: Insufficient documentation

## 2018-10-13 DIAGNOSIS — J439 Emphysema, unspecified: Secondary | ICD-10-CM | POA: Insufficient documentation

## 2018-10-13 DIAGNOSIS — Z79899 Other long term (current) drug therapy: Secondary | ICD-10-CM | POA: Insufficient documentation

## 2018-10-13 DIAGNOSIS — D500 Iron deficiency anemia secondary to blood loss (chronic): Secondary | ICD-10-CM

## 2018-10-13 LAB — COMPREHENSIVE METABOLIC PANEL
ALT: 22 U/L (ref 0–44)
AST: 21 U/L (ref 15–41)
Albumin: 4.1 g/dL (ref 3.5–5.0)
Alkaline Phosphatase: 58 U/L (ref 38–126)
Anion gap: 7 (ref 5–15)
BUN: 22 mg/dL — ABNORMAL HIGH (ref 6–20)
CO2: 27 mmol/L (ref 22–32)
Calcium: 9.7 mg/dL (ref 8.9–10.3)
Chloride: 105 mmol/L (ref 98–111)
Creatinine, Ser: 1.28 mg/dL — ABNORMAL HIGH (ref 0.61–1.24)
GFR calc Af Amer: >60 mL/min (ref >60)
GFR calc non Af Amer: >60 mL/min (ref >60)
Glucose, Bld: 108 mg/dL — ABNORMAL HIGH (ref 70–99)
Potassium: 4.8 mmol/L (ref 3.5–5.1)
Sodium: 139 mmol/L (ref 135–145)
Total Bilirubin: 0.4 mg/dL (ref 0.3–1.2)
Total Protein: 6.9 g/dL (ref 6.5–8.1)

## 2018-10-13 LAB — CBC WITH DIFFERENTIAL/PLATELET
Abs Immature Granulocytes: 0.02 10*3/uL (ref 0.00–0.07)
Basophils Absolute: 0 10*3/uL (ref 0.0–0.1)
Basophils Relative: 0 %
Eosinophils Absolute: 0.2 10*3/uL (ref 0.0–0.5)
Eosinophils Relative: 3 %
HCT: 40.7 % (ref 39.0–52.0)
Hemoglobin: 13.8 g/dL (ref 13.0–17.0)
Immature Granulocytes: 0 %
Lymphocytes Relative: 32 %
Lymphs Abs: 1.9 10*3/uL (ref 0.7–4.0)
MCH: 31 pg (ref 26.0–34.0)
MCHC: 33.9 g/dL (ref 30.0–36.0)
MCV: 91.5 fL (ref 80.0–100.0)
Monocytes Absolute: 0.6 10*3/uL (ref 0.1–1.0)
Monocytes Relative: 10 %
Neutro Abs: 3.4 10*3/uL (ref 1.7–7.7)
Neutrophils Relative %: 55 %
Platelets: 105 10*3/uL — ABNORMAL LOW (ref 150–400)
RBC: 4.45 MIL/uL (ref 4.22–5.81)
RDW: 13.6 % (ref 11.5–15.5)
WBC: 6.1 10*3/uL (ref 4.0–10.5)
nRBC: 0 % (ref 0.0–0.2)

## 2018-10-13 LAB — CEA (IN HOUSE-CHCC): CEA (CHCC-In House): 4.34 ng/mL (ref 0.00–5.00)

## 2018-10-13 LAB — IRON AND TIBC
Iron: 80 ug/dL (ref 42–163)
Saturation Ratios: 30 % (ref 20–55)
TIBC: 263 ug/dL (ref 202–409)
UIBC: 183 ug/dL (ref 117–376)

## 2018-10-13 LAB — FERRITIN: Ferritin: 57 ng/mL (ref 24–336)

## 2018-10-13 MED ORDER — INFLUENZA VAC SPLIT QUAD 0.5 ML IM SUSY
0.5000 mL | PREFILLED_SYRINGE | Freq: Once | INTRAMUSCULAR | Status: AC
  Administered 2018-10-13: 0.5 mL via INTRAMUSCULAR

## 2018-10-13 MED ORDER — INFLUENZA VAC SPLIT QUAD 0.5 ML IM SUSY
PREFILLED_SYRINGE | INTRAMUSCULAR | Status: AC
  Filled 2018-10-13: qty 0.5

## 2018-10-13 NOTE — Telephone Encounter (Signed)
Gave avs and calendar ° °

## 2018-10-14 ENCOUNTER — Telehealth: Payer: Self-pay | Admitting: *Deleted

## 2018-10-14 ENCOUNTER — Telehealth: Payer: Self-pay

## 2018-10-14 NOTE — Telephone Encounter (Signed)
Called patient, left message to see if the patient wanted to be scheduled for a colonoscopy at the hospital. Awaiting response.

## 2018-10-14 NOTE — Telephone Encounter (Signed)
Left voicemail on patient's phone informing him of his elevated creatinine level and encouraging him to increase is intake of non-caffinated beverages. Also informed him that his iron study and tumor markers were normal. Encouraged him to call the office should he have any other questions/concerns.

## 2018-10-14 NOTE — Telephone Encounter (Signed)
Patient returned phone call and does not want to be scheduled for his colonoscopy. He does not want to be screened for COVID-19.

## 2018-10-14 NOTE — Telephone Encounter (Signed)
-----   Message from Jonnie Finner, RN sent at 10/14/2018  8:59 AM EDT -----  ----- Message ----- From: [name], MD Sent: 10/13/2018   5:01 PM EDT To: Jonnie Finner, RN  Please call pt and let him know his lab results, Cr slightly elevated, encourage him to drink fluids adequately. Iron study and tumor marker were all normal, no concerns, thanks   [name]  10/13/2018

## 2018-11-18 ENCOUNTER — Encounter: Payer: Self-pay | Admitting: Internal Medicine

## 2018-11-18 ENCOUNTER — Other Ambulatory Visit: Payer: Self-pay

## 2018-11-18 ENCOUNTER — Ambulatory Visit (INDEPENDENT_AMBULATORY_CARE_PROVIDER_SITE_OTHER): Payer: Medicare Other | Admitting: Internal Medicine

## 2018-11-18 VITALS — BP 110/80 | HR 80 | Ht 67.5 in | Wt 174.0 lb

## 2018-11-18 DIAGNOSIS — Z95810 Presence of automatic (implantable) cardiac defibrillator: Secondary | ICD-10-CM | POA: Diagnosis not present

## 2018-11-18 DIAGNOSIS — I255 Ischemic cardiomyopathy: Secondary | ICD-10-CM

## 2018-11-18 DIAGNOSIS — I5022 Chronic systolic (congestive) heart failure: Secondary | ICD-10-CM

## 2018-11-18 DIAGNOSIS — I2510 Atherosclerotic heart disease of native coronary artery without angina pectoris: Secondary | ICD-10-CM | POA: Diagnosis not present

## 2018-11-18 NOTE — Patient Instructions (Signed)
Medication Instructions:  Your physician recommends that you continue on your current medications as directed. Please refer to the Current Medication list given to you today.  Labwork: None ordered.  Testing/Procedures: None ordered.  Follow-Up: Your physician wants you to follow-up in: one year with Dr. Lovena Le.   You will receive a reminder letter in the mail two months in advance. If you don't receive a letter, please call our office to schedule the follow-up appointment.  Remote monitoring is used to monitor your ICD from home. This monitoring reduces the number of office visits required to check your device to one time per year. It allows [place] to keep an eye on the functioning of your device to ensure it is working properly. You are scheduled for a device check from home on 12/24/2018. You may send your transmission at any time that day. If you have a wireless device, the transmission will be sent automatically. After your physician reviews your transmission, you will receive a postcard with your next transmission date.  Any Other Special Instructions Will Be Listed Below (If Applicable).  If you need a refill on your cardiac medications before your next appointment, please call your pharmacy.

## 2018-11-18 NOTE — Progress Notes (Signed)
HPI Mr. Swinger returns today for follow-up. He is a very pleasant 58 year old man with chronic systolic heart failure, and ischemic cardiomyopathy, coronary artery disease status post MI, who developed colon cancer and underwent resection. He has not had evidence of recurrent CA. The patient denies chest pain or shortness of breath. He remains anxious. He admits to some dietary indiscretion with sodium.  Allergies  Allergen Reactions  . Capecitabine Other (See Comments)    Server dehydration and diarrhea      . Celebrex [Celecoxib] Other (See Comments)    Caused his back to burn  . Contrast Media [Iodinated Diagnostic Agents] Other (See Comments)    Patient feels weakness     Current Outpatient Medications  Medication Sig Dispense Refill  . aspirin 81 MG chewable tablet Chew 1 tablet (81 mg total) by mouth daily. 30 tablet 5  . atorvastatin (LIPITOR) 80 MG tablet TAKE 1 TABLET BY MOUTH ONCE DAILY AT  6  PM 90 tablet 3  . carvedilol (COREG) 3.125 MG tablet TAKE 1 TABLET BY MOUTH TWICE DAILY WITH MEALS 180 tablet 2  . clopidogrel (PLAVIX) 75 MG tablet Take 1 tablet (75 mg total) by mouth daily. 90 tablet 3  . digoxin (LANOXIN) 0.125 MG tablet Take 1 tablet (0.125 mg total) by mouth daily. 90 tablet 3  . lisinopril (ZESTRIL) 2.5 MG tablet Take 1 tablet (2.5 mg total) by mouth daily. 90 tablet 2  . Multiple Vitamin (MULTIVITAMIN WITH MINERALS) TABS tablet Take 1 tablet by mouth daily. 30 tablet 0  . nitroGLYCERIN (NITROSTAT) 0.4 MG SL tablet Place 1 tablet (0.4 mg total) under the tongue every 5 (five) minutes as needed for chest pain. 25 tablet 6  . PEG-KCl-NaCl-NaSulf-Na Asc-C (PLENVU) 140 g SOLR Take 1 kit by mouth as directed. 1 each 0  . saccharomyces boulardii (FLORASTOR) 250 MG capsule Take 1 capsule (250 mg total) by mouth 2 (two) times daily. 30 capsule 0   No current facility-administered medications for this visit.      Past Medical History:  Diagnosis Date  .  Adenocarcinoma of descending colon  pT3, pN1a, pMX s/p colectomy/ostomy 05/18/2016 05/26/2016  . AICD (automatic cardioverter/defibrillator) present    09/07/13 Dr. Lovena Le post MI with EF 30% RV lead      Medtronic 6947 (serial number [id number]) Generator   Medtronic  Evera XT VR (serial Number [id number])   . Anemia   . Anemia due to blood loss 05/03/2016  . Anxiety   . Anxiety state 01/17/2015  . Automatic implantable cardioverter-defibrillator in situ    MDT Aug 2015 Dr. Lovena Le  . Benign neoplasm of cecum   . CAD (coronary artery disease), native coronary artery 07/03/2013   Cath 06/20/13  Normal left main, occluded LAD, occluded RCA, 50% circ EF 15%  3.0 x28 and 3.0 x 8 mm Xience stent Dr. Tamala Julian  To LAD   . Cancer ([place])   . Cardiomyopathy, ischemic   . Chronic anticoagulation 06/12/2017  . Chronic kidney disease   . Chronic systolic CHF (congestive heart failure) (HCC)    ECHO 08/05/13  EF 30%  Anterior akinesis and inferior hypokinesis   . COPD (chronic obstructive pulmonary disease) ([place])   . Essential hypertension 05/16/2016  . Financial difficulties 10/01/2013  . History of colon cancer   . Hyperlipidemia   . Intra-abdominal abscess 06/12/2017  . Iron deficiency anemia due to chronic blood loss 07/02/2016  . Mass of colon 04/2016  . Myocardial infarction ([place])  04/29/2013  . Old anterior myocardial infarction 06/20/2013   per pt 12/22/2014 had an MI  . Partial bowel obstruction ([place]) 05/16/2016  . S/P colostomy takedown 05/20/2017  . Thrombocytopenia ([place]) 12/27/2014   Chronic    . Tobacco use 07/03/2013    ROS:   All systems reviewed and negative except as noted in the HPI.   Past Surgical History:  Procedure Laterality Date  . CARDIAC CATHETERIZATION  05/2013   . CARDIAC CATHETERIZATION N/A 12/28/2014   Procedure: Left Heart Cath and Coronary Angiography;  Surgeon: [name], MD;  Location: [facility];  Service: Cardiovascular;  Laterality: N/A;  . CARDIAC CATHETERIZATION  N/A 12/28/2014   Procedure: Coronary Stent Intervention;  Surgeon: [name], MD;  Location: [facility];  Service: Cardiovascular;  Laterality: N/A;  . COLECTOMY WITH COLOSTOMY CREATION/HARTMANN PROCEDURE Left 05/18/2016   Procedure: COLECTOMY WITH OSTOMY CREATION/HARTMANN PROCEDURE;  Surgeon: [name], MD;  Location: [facility];  Service: General;  Laterality: Left;  . COLONOSCOPY    . COLONOSCOPY WITH PROPOFOL N/A 04/01/2017   Procedure: COLONOSCOPY WITH PROPOFOL;  Surgeon: [name], MD;  Location: [facility];  Service: Gastroenterology;  Laterality: N/A;  . COLOSTOMY TAKEDOWN N/A 05/20/2017   Procedure: COLOSTOMY TAKEDOWN;  Surgeon: [name], MD;  Location: [place];  Service: General;  Laterality: N/A;  . CORONARY ANGIOPLASTY  05/2013   . FLEXIBLE SIGMOIDOSCOPY N/A 05/16/2016   Procedure: FLEXIBLE SIGMOIDOSCOPY;  Surgeon: [name], MD;  Location: [place];  Service: Gastroenterology;  Laterality: N/A;  . HAND SURGERY Right    cyst removal  . ICD placement  09/07/2013   . IMPLANTABLE CARDIOVERTER DEFIBRILLATOR IMPLANT N/A 09/07/2013   Procedure: IMPLANTABLE CARDIOVERTER DEFIBRILLATOR IMPLANT;  Surgeon: [name], MD;  Location: [facility] C[facility] LAB;  Service: Cardiovascular;  Laterality: N/A;  . INTRA-AORTIC BALLOON PUMP INSERTION  06/20/2013   Procedure: INTRA-AORTIC BALLOON PUMP INSERTION;  Surgeon: Sinclair Grooms, MD;  Location: [facility];  Service: Cardiovascular;;  . LEFT HEART CATHETERIZATION WITH CORONARY ANGIOGRAM N/A 06/20/2013   Procedure: LEFT HEART CATHETERIZATION WITH CORONARY ANGIOGRAM;  Surgeon: Sinclair Grooms, MD;  Location: [facility] CATH LAB;  Service: Cardiovascular;  Laterality: N/A;  . PERCUTANEOUS CORONARY STENT INTERVENTION (PCI-S)  06/20/2013   Procedure: PERCUTANEOUS CORONARY STENT INTERVENTION (PCI-S);  Surgeon: Sinclair Grooms, MD;  [facility] C[facility] LAB;  Service: Cardiovascular;;     Family History  Problem Relation Age of  Onset  . Emphysema Father   . Lung disease Father   . Heart disease Mother   . Dementia Mother   . Diabetes Mother   . CVA Mother   . Alzheimer's disease Mother   . Pancreatic cancer Maternal Grandmother   . Lung cancer Maternal Grandmother   . Cancer Neg Hx      Social History   Socioeconomic History  . Marital status: Single    Spouse name: Not on file  . Number of children: 0  . Years of education: 34  . Highest education level: Not on file  Occupational History  . Occupation: Disabled  Social Needs  . Financial resource strain: Not on file  . Food insecurity    Worry: Not on file    Inability: Not on file  . Transportation needs    Medical: Not on file    Non-medical: Not on file  Tobacco Use  . Smoking status: Former Smoker    Packs/day: 2.00  Years: 40.00    Pack years: 80.00    Types: Cigarettes    Quit date: 12/22/2014    Years since quitting: 3.9  . Smokeless tobacco: Never Used  Substance and Sexual Activity  . Alcohol use: No    Comment: quit in 08/2013, he used to drink alcohol moderate (1 pine liquor one week)  for 30 years   . Drug use: No  . Sexual activity: Not Currently    Birth control/protection: Condom    Comment: men   Lifestyle  . Physical activity    Days per week: Not on file    Minutes per session: Not on file  . Stress: Not on file  Relationships  . Social Herbalist on phone: Not on file    Gets together: Not on file    Attends religious service: Not on file    Active member of club or organization: Not on file    Attends meetings of clubs or organizations: Not on file    Relationship status: Not on file  . Intimate partner violence    Fear of current or ex partner: Not on file    Emotionally abused: Not on file    Physically abused: Not on file    Forced sexual activity: Not on file  Other Topics Concern  . Not on file  Social History Narrative   Lives alone.    In a house trying to sell his home. Exercise:  No.     BP 110/80   Pulse 80   Ht 5' 7.5" (1.715 m)   Wt 174 lb (78.9 kg)   SpO2 98%   BMI 26.85 kg/m   Physical Exam:  Well appearing NAD HEENT: Unremarkable Neck:  No JVD, no thyromegally Lymphatics:  No adenopathy Back:  No CVA tenderness Lungs:  Clear with no wheezes HEART:  Regular rate rhythm, no murmurs, no rubs, no clicks Abd:  soft, positive bowel sounds, no organomegally, no rebound, no guarding Ext:  2 plus pulses, no edema, no cyanosis, no clubbing Skin:  No rashes no nodules Neuro:  CN II through XII intact, motor grossly intact  EKG - NSR with LVH and IVCD  DEVICE  Normal device function.  See PaceArt for details.   Assess/Plan: 1. Chronic systolic heart failure - his symptoms are class 2. He will continue his current meds. 2. ICM - he is s/p MI. He will continue his current meds. 3. ICD - his medtronic single chamber ICD is working normally.  Christopher [name].D.

## 2018-12-24 ENCOUNTER — Telehealth: Payer: Self-pay

## 2018-12-24 ENCOUNTER — Telehealth: Payer: Self-pay | Admitting: Internal Medicine

## 2018-12-24 NOTE — Telephone Encounter (Signed)
I spoke to the pt and gave him the number to [facility] support. He stated that he did not feel like anybody care. I told him the my chart message was sent to the wrong department at first. I told him as soon as I saw his my chart message I called him and left him a my chart message. I told him any time he has questions about the monitor or his ICD to call my direct office number and if I can not answer his questions I will get him to the right person. The pt thanked me for my help and agreed to call [facility] tech support to get additional help with his home remote monitor.

## 2018-12-24 NOTE — Telephone Encounter (Signed)
Patient would like to know if his transmission went through. States his machine is 56 years old and may need a new one. Please advise, he is very upset.

## 2018-12-24 NOTE — Telephone Encounter (Signed)
See duplicate phone note opened 1125/20.

## 2018-12-24 NOTE — Telephone Encounter (Signed)
LMOVM for pt to call my direct office number to get assistance with his home remote monitor.

## 2018-12-29 ENCOUNTER — Telehealth: Payer: Self-pay

## 2018-12-29 ENCOUNTER — Ambulatory Visit (INDEPENDENT_AMBULATORY_CARE_PROVIDER_SITE_OTHER): Payer: Medicare Other | Admitting: *Deleted

## 2018-12-29 DIAGNOSIS — Z95810 Presence of automatic (implantable) cardiac defibrillator: Secondary | ICD-10-CM

## 2018-12-29 LAB — CUP PACEART REMOTE DEVICE CHECK
Battery Remaining Longevity: 80 mo
Battery Voltage: 2.98 V
Brady Statistic RV Percent Paced: 0.01 %
Date Time Interrogation Session: 20201130141104
HighPow Impedance: 65 Ohm
Implantable Lead Implant Date: 20150810
Implantable Lead Location: 753860
Implantable Lead Model: 6935
Implantable Pulse Generator Implant Date: 20150810
Lead Channel Impedance Value: 361 Ohm
Lead Channel Impedance Value: 456 Ohm
Lead Channel Pacing Threshold Amplitude: 0.5 V
Lead Channel Pacing Threshold Pulse Width: 0.4 ms
Lead Channel Sensing Intrinsic Amplitude: 5.875 mV
Lead Channel Sensing Intrinsic Amplitude: 5.875 mV
Lead Channel Setting Pacing Amplitude: 2 V
Lead Channel Setting Pacing Pulse Width: 0.4 ms
Lead Channel Setting Sensing Sensitivity: 0.3 mV

## 2018-12-29 NOTE — Telephone Encounter (Signed)
Pt states he received his new monitor and sent a transmission with it. I let him know his next remote appointment would be 03-30-2019. The pt verbalized understanding and thanked me for my help.

## 2019-01-12 ENCOUNTER — Encounter: Payer: Self-pay | Admitting: Hematology

## 2019-01-13 ENCOUNTER — Encounter: Payer: Self-pay | Admitting: *Deleted

## 2019-01-21 NOTE — Progress Notes (Signed)
ICD remote 

## 2019-02-09 ENCOUNTER — Telehealth: Payer: Self-pay | Admitting: Hematology

## 2019-02-09 NOTE — Telephone Encounter (Signed)
Called patient per providers request, patient would prefer 01/14 to be a telephone visit. Labs cancelled.  Message to provider.

## 2019-02-09 NOTE — Progress Notes (Signed)
[place]   Telephone:([phone]) [phone] Fax:(336) [phone]   Clinic Follow up Note   Patient Care Team: Patient, No Pcp Per as PCP - General (General Practice) Burnell Blanks, MD as PCP - Cardiology (Cardiology) [name], MD as Consulting Physician (Cardiology) [name], MD as Consulting Physician (Cardiology) [name], MD as Consulting Physician (Hematology) [name], MD as Consulting Physician (General Surgery)  I connected with Christopher [name] on 02/12/2019 at  8:40 AM EST by telephone visit and verified that I am speaking with the correct person using two identifiers.  I discussed the limitations, risks, security and privacy concerns of performing an evaluation and management service by telephone and the availability of in person appointments. I also discussed with the patient that there may be a patient responsible charge related to this service. The patient expressed understanding and agreed to proceed.   [name] location:  His home  Provider's location:  My Office   CHIEF COMPLAINT: F/u on left side colon cancer, stage IIIB  SUMMARY OF ONCOLOGIC HISTORY: Oncology History Overview Note  Cancer Staging Adenocarcinoma of descending colon  pT3, pN1a, pMX s/p colectomy/ostomy 05/18/2016 Staging form: Colon and Rectum, AJCC 8th Edition - Pathologic stage from 05/18/2016: Stage IIIB (pT3, pN1a, cM0) - Signed by [name], MD on 07/01/2016     Adenocarcinoma of descending colon  pT3, pN1a, pMX s/p colectomy/ostomy 05/18/2016  05/09/2016 Imaging   CT chest, abdomen and pelvis showed probable descending colon carcinoma. No evidence of distant metastasis. 7 cm hypervascular focus in the upper liver, not typical for metastatic disease.   05/16/2016 Procedure   Colonoscopy showed a large mass which completely obstructss the distal descending colon. Biopsied. Diverticulosis in the left colon.   05/18/2016 Surgery   Left hemicolectomy by Dr.  [name]   05/18/2016 Pathology Results   Left colon segmental resection shows invasive colorectal adenocarcinoma, 4.6 cm, tumor extends into subserosa connective tissue, margins are negative, metastatic carcinoma in 1 of 12 lymph nodes. Grade 2, lymphovascular invasion present, perineural invasion negative.   05/18/2016 Miscellaneous   MSI-stable    05/18/2016 Initial Diagnosis   Adenocarcinoma of descending colon  pT3, pN1a, pMX s/p colectomy/ostomy 05/18/2016   07/16/2016 - 08/22/2016 Chemotherapy   Xeloda twice a day for 2 weeks on and 1 week off.  stopped after 3 days due to severe diarrhea, and changed to 154m bid, started on 07/31/2016; Hold Xeloda starting 08/22/16 due to AKI. Will lower Xeloda to 3 in the morning and 2 in the evening when restart next cycle.   Stopped due to poor tolerance of sever diarrhea.     01/14/2017 Imaging   CT CAP W Contrast 01/14/17  IMPRESSION: 1. Stable exam. No new or progressive findings in the chest, abdomen, or pelvis. The tiny hypervascular focus in the dome of the liver seen on the previous study is not evident on today's exam. No evidence for metastatic disease on today's CT scan. 2.  Aortic Atherosclerois (ICD10-170.0) 3. Distal transverse end colostomy. 4.  Emphysema. (ICD10-J43.9)   04/01/2017 Procedure   04/01/2017 - One 6 mm polyp in the cecum, removed with a cold snare. Resected and retrieved. - Diverticulosis in the left colon.   04/01/2017 Pathology Results   Colon, polyp(s), cecal cold snare - TUBULAR ADENOMA (ONE FRAGMENT). - NO HIGH GRADE DYSPLASIA OR MALIGNANCY.   05/20/2017 Surgery   Surgery:05/20/2017 COLOSTOMY TAKEDOWN BGeorganna SkeansMD    05/20/2017 Pathology Results   Pathology: 05/20/2017 Stoma -  FINDINGS CONSISTENT WITH OSTOMY. - NO DYSPLASIA OR MALIGNANCY.    06/02/2017 Imaging   06/02/2017 CT AP IMPRESSION: 1. Postsurgical wounds in the anterior abdominal wall with skin staples and edema. No dehiscence or discrete  fluid collection. 2. Partial colectomy with anastomosis in left hemiabdomen. Mild edema surrounding site of anastomosis, probably postsurgical changes. 3. No abscess, bowel wall thickening, or obstruction identified.   06/12/2017 Imaging   06/12/2017 CT AP IMPRESSION: Improving appearance to what may have represented a focal anastomotic leak posterior to the left colon at the level of reanastomosis after colostomy takedown. An area of probable extraluminal air and fluid is smaller and contains less extraluminal air. This is likely consistent with resolving abscess. Some residual inflammation remains in this region. No associated bowel obstruction or free air.   06/09/2018 Imaging   CT CAP WO contrast  IMPRESSION: Stable exam. No evidence of recurrent or metastatic carcinoma within the chest, abdomen, or pelvis.  Aortic Atherosclerosis (ICD10-I70.0). Coronary artery atherosclerosis.      CURRENT THERAPY:  Surveillance  INTERVAL HISTORY:  Christopher [name] is here for a follow up of colon cancer. He was last seen by me 4 months ago. He was able to identify himself by birth date. He notes he is doing well. He denies any abdominal issues. He notes he has gained to 178 pounds as he has been eating more. He denies chest discomfort or SOB. He has been following his cardiologist. Due to [place] his colonoscopy was cancelled with Dr Loletha Carrow. His last was in 03/2017.     REVIEW OF SYSTEMS:   Constitutional: Denies fevers, chills or abnormal weight loss Eyes: Denies blurriness of vision Ears, nose, mouth, throat, and face: Denies mucositis or sore throat Respiratory: Denies cough, dyspnea or wheezes Cardiovascular: Denies palpitation, chest discomfort or lower extremity swelling Gastrointestinal:  Denies nausea, heartburn or change in bowel habits Skin: Denies abnormal skin rashes Lymphatics: Denies new lymphadenopathy or easy bruising Neurological:Denies numbness, tingling or new  weaknesses Behavioral/Psych: Mood is stable, no new changes  All other systems were reviewed with the patient and are negative.  MEDICAL HISTORY:  Past Medical History:  Diagnosis Date  . Adenocarcinoma of descending colon  pT3, pN1a, pMX s/p colectomy/ostomy 05/18/2016 05/26/2016  . AICD (automatic cardioverter/defibrillator) present    09/07/13 Dr. Lovena Le post MI with EF 30% RV lead      Medtronic 6947 (serial number [id number]) Generator   Medtronic  Evera XT VR (serial Number [id number])   . Anemia   . Anemia due to blood loss 05/03/2016  . Anxiety   . Anxiety state 01/17/2015  . Automatic implantable cardioverter-defibrillator in situ    MDT Aug 2015 Dr. Lovena Le  . Benign neoplasm of cecum   . CAD (coronary artery disease), native coronary artery 07/03/2013   Cath 06/20/13  Normal left main, occluded LAD, occluded RCA, 50% circ EF 15%  3.0 x28 and 3.0 x 8 mm Xience stent Dr. Tamala Julian  To LAD   . Cancer ([place])   . Cardiomyopathy, ischemic   . Chronic anticoagulation 06/12/2017  . Chronic kidney disease   . Chronic systolic CHF (congestive heart failure) (HCC)    ECHO 08/05/13  EF 30%  Anterior akinesis and inferior hypokinesis   . COPD (chronic obstructive pulmonary disease) ([place])   . Essential hypertension 05/16/2016  . Financial difficulties 10/01/2013  . History of colon cancer   . Hyperlipidemia   . Intra-abdominal abscess 06/12/2017  . Iron deficiency anemia due to chronic blood  loss 07/02/2016  . Mass of colon 04/2016  . Myocardial infarction ([place]) 04/29/2013  . Old anterior myocardial infarction 06/20/2013   per pt 12/22/2014 had an MI  . Partial bowel obstruction ([place]) 05/16/2016  . S/P colostomy takedown 05/20/2017  . Thrombocytopenia ([place]) 12/27/2014   Chronic    . Tobacco use 07/03/2013    SURGICAL HISTORY: Past Surgical History:  Procedure Laterality Date  . CARDIAC CATHETERIZATION  05/2013   . CARDIAC CATHETERIZATION N/A 12/28/2014   Procedure: Left Heart Cath and Coronary  Angiography;  Surgeon: [name], MD;  Location: [facility];  Service: Cardiovascular;  Laterality: N/A;  . CARDIAC CATHETERIZATION N/A 12/28/2014   Procedure: Coronary Stent Intervention;  Surgeon: [name], MD;  Location: [facility];  Service: Cardiovascular;  Laterality: N/A;  . COLECTOMY WITH COLOSTOMY CREATION/HARTMANN PROCEDURE Left 05/18/2016   Procedure: COLECTOMY WITH OSTOMY CREATION/HARTMANN PROCEDURE;  Surgeon: [name], MD;  Location: [facility];  Service: General;  Laterality: Left;  . COLONOSCOPY    . COLONOSCOPY WITH PROPOFOL N/A 04/01/2017   Procedure: COLONOSCOPY WITH PROPOFOL;  Surgeon: [name], MD;  Location: [facility];  Service: Gastroenterology;  Laterality: N/A;  . COLOSTOMY TAKEDOWN N/A 05/20/2017   Procedure: COLOSTOMY TAKEDOWN;  Surgeon: [name], MD;  Location: [facility];  Service: General;  Laterality: N/A;  . CORONARY ANGIOPLASTY  05/2013   . FLEXIBLE SIGMOIDOSCOPY N/A 05/16/2016   Procedure: FLEXIBLE SIGMOIDOSCOPY;  Surgeon: [name], MD;  Location: [facility];  Service: Gastroenterology;  Laterality: N/A;  . HAND SURGERY Right    cyst removal  . ICD placement  09/07/2013   . IMPLANTABLE CARDIOVERTER DEFIBRILLATOR IMPLANT N/A 09/07/2013   Procedure: IMPLANTABLE CARDIOVERTER DEFIBRILLATOR IMPLANT;  Surgeon: [name], MD;  Location: [facility] C[facility] LAB;  Service: Cardiovascular;  Laterality: N/A;  . INTRA-AORTIC BALLOON PUMP INSERTION  06/20/2013   Procedure: INTRA-AORTIC BALLOON PUMP INSERTION;  Surgeon: Sinclair Grooms, MD;  Location: [facility] C[facility] LAB;  Service: Cardiovascular;;  . LEFT HEART CATHETERIZATION WITH CORONARY ANGIOGRAM N/A 06/20/2013   Procedure: LEFT HEART CATHETERIZATION WITH CORONARY ANGIOGRAM;  Surgeon: Sinclair Grooms, MD;  Location: [facility] CATH LAB;  Service: Cardiovascular;  Laterality: N/A;  . PERCUTANEOUS CORONARY STENT INTERVENTION (PCI-S)  06/20/2013   Procedure: PERCUTANEOUS CORONARY STENT INTERVENTION  (PCI-S);  Surgeon: Sinclair Grooms, MD;  Location: [facility] CATH LAB;  Service: Cardiovascular;;    I have reviewed the social history and family history with the patient and they are unchanged from previous note.  ALLERGIES:  is allergic to capecitabine; celebrex [celecoxib]; and contrast media [iodinated diagnostic agents].  MEDICATIONS:  Current Outpatient Medications  Medication Sig Dispense Refill  . aspirin 81 MG chewable tablet Chew 1 tablet (81 mg total) by mouth daily. 30 tablet 5  . atorvastatin (LIPITOR) 80 MG tablet TAKE 1 TABLET BY MOUTH ONCE DAILY AT  6  PM 90 tablet 3  . carvedilol (COREG) 3.125 MG tablet TAKE 1 TABLET BY MOUTH TWICE DAILY WITH MEALS 180 tablet 2  . clopidogrel (PLAVIX) 75 MG tablet Take 1 tablet (75 mg total) by mouth daily. 90 tablet 3  . digoxin (LANOXIN) 0.125 MG tablet Take 1 tablet (0.125 mg total) by mouth daily. 90 tablet 3  . lisinopril (ZESTRIL) 2.5 MG tablet Take 1 tablet (2.5 mg total) by mouth daily. 90 tablet 2  . Multiple Vitamin (MULTIVITAMIN WITH MINERALS) TABS tablet Take 1 tablet by mouth daily. 30 tablet 0  .  nitroGLYCERIN (NITROSTAT) 0.4 MG SL tablet Place 1 tablet (0.4 mg total) under the tongue every 5 (five) minutes as needed for chest pain. 25 tablet 6  . PEG-KCl-NaCl-NaSulf-Na Asc-C (PLENVU) 140 g SOLR Take 1 kit by mouth as directed. 1 each 0  . saccharomyces boulardii (FLORASTOR) 250 MG capsule Take 1 capsule (250 mg total) by mouth 2 (two) times daily. 30 capsule 0   No current facility-administered medications for this visit.    PHYSICAL EXAMINATION: ECOG PERFORMANCE STATUS: 0 - Asymptomatic   No vitals taken today, Exam not performed today   LABORATORY DATA:  I have reviewed the data as listed CBC Latest Ref Rng & Units 10/13/2018 06/09/2018 02/06/2018  WBC 4.0 - 10.5 K/uL 6.1 6.7 6.7  Hemoglobin 13.0 - 17.0 g/dL 13.8 13.4 13.7  Hematocrit 39.0 - 52.0 % 40.7 40.6 41.6  Platelets 150 - 400 K/uL 105(L) 126(L) 131(L)     CMP  Latest Ref Rng & Units 10/13/2018 06/09/2018 02/06/2018  Glucose 70 - 99 mg/dL 108(H) 106(H) 99  BUN 6 - 20 mg/dL 22(H) 22(H) 24(H)  Creatinine 0.61 - 1.24 mg/dL 1.28(H) 1.19 1.18  Sodium 135 - 145 mmol/L 139 139 140  Potassium 3.5 - 5.1 mmol/L 4.8 4.4 4.9  Chloride 98 - 111 mmol/L 105 105 107  CO2 22 - 32 mmol/L 27 24 24   Calcium 8.9 - 10.3 mg/dL 9.7 8.8(L) 9.7  Total Protein 6.5 - 8.1 g/dL 6.9 7.0 7.1  Total Bilirubin 0.3 - 1.2 mg/dL 0.4 0.2(L) 0.5  Alkaline Phos 38 - 126 U/L 58 53 61  AST 15 - 41 U/L 21 24 23   ALT 0 - 44 U/L 22 32 27      RADIOGRAPHIC STUDIES: I have personally reviewed the radiological images as listed and agreed with the findings in the report. No results found.   ASSESSMENT & PLAN:  Christopher [name] is a 59 y.o. male with    1. Invasive Colorectal Adenocarcinoma of the Left Colon, pT3N1aM0, Stage IIIB, MSI-stable -Diagnosed in 04/2016. Treated with left hemicolectomy andadjuvantchemo. He is currently on surveillance. -He states that he previously had a bad reaction toCT ivcontrast.Will avoid in the future. -His last colonoscopy was in 03/2017 and had 1 small benign polyp removed. His scheduled repeat in 2020 was cancelled due to [place]. I recommend he proceed in 2022. He is agreeable.  -He is clinically doing well and stable. He has no concerns of abdominal issues. Based on prior elevated Cr and recent weight gain I encouraged him to drink more water, increase activity and reduce carbohydrate intake.  -He is almost 3 years from diagnosis. His risk of recurrence is decreasing. Plan for next scan in 5 months  -F/u in 5 months.  -I encouraged him to proceed with COVID19 vaccine when it becomes available to him now, he is interested.   2. CAD and systolic CHF with EF 29-56% -s/p 2 stent placements and a pace maker. -On Plavix  -Continue to f/u with cardiology -His CT CAP from 06/09/18 showed incidental finding of coronary artery atherosclerosis. I recommend  him to discuss with cardiology to see if he needs stress test  3. COPD -He previously quit smoking. Stable   Plan -f/u in 5 months with lab and CT CAP W Contrast a few days before.    No problem-specific Assessment & Plan notes found for this encounter.   Orders Placed This Encounter  Procedures  . CT Abdomen Pelvis W Contrast    Hold IV  contrast if EGFR<50    Standing Status:   Future    Standing Expiration Date:   02/12/2020    Order Specific Question:   If indicated for the ordered procedure, I authorize the administration of contrast media per Radiology protocol    Answer:   Yes    Order Specific Question:   Preferred imaging location?    Answer:   [facility]    Order Specific Question:   Is Oral Contrast requested for this exam?    Answer:   Yes, Per Radiology protocol    Order Specific Question:   Radiology Contrast Protocol - do NOT remove file path    Answer:   \\[email or web address]  . CT Chest W Contrast    Standing Status:   Future    Standing Expiration Date:   02/12/2020    Order Specific Question:   If indicated for the ordered procedure, I authorize the administration of contrast media per Radiology protocol    Answer:   Yes    Order Specific Question:   Preferred imaging location?    Answer:   [facility]    Order Specific Question:   Radiology Contrast Protocol - do NOT remove file path    Answer:   \\[email or web address]   I discussed the assessment and treatment plan with the patient. The patient was provided an opportunity to ask questions and all were answered. The patient agreed with the plan and demonstrated an understanding of the instructions.  The patient was advised to call back or seek an in-person evaluation if the symptoms worsen or if the condition fails to improve as anticipated.   The total time spent in the appointment was 25 minutes.     [name], MD 02/12/2019   I, Joslyn Devon, am acting as scribe for [name], MD.   I have reviewed the above documentation for accuracy and completeness, and I agree with the above.

## 2019-02-12 ENCOUNTER — Encounter: Payer: Self-pay | Admitting: Hematology

## 2019-02-12 ENCOUNTER — Inpatient Hospital Stay: Payer: Medicare Other

## 2019-02-12 ENCOUNTER — Telehealth: Payer: Self-pay | Admitting: Hematology

## 2019-02-12 ENCOUNTER — Inpatient Hospital Stay: Payer: Medicare Other | Attending: Hematology | Admitting: Hematology

## 2019-02-12 DIAGNOSIS — C186 Malignant neoplasm of descending colon: Secondary | ICD-10-CM | POA: Diagnosis not present

## 2019-02-12 NOTE — Telephone Encounter (Signed)
Scheduled appt per 1/14 los.  Sent a message to HIM pool to get a calendar mailed out. 

## 2019-03-30 ENCOUNTER — Ambulatory Visit (INDEPENDENT_AMBULATORY_CARE_PROVIDER_SITE_OTHER): Payer: Medicare Other | Admitting: *Deleted

## 2019-03-30 ENCOUNTER — Other Ambulatory Visit: Payer: Self-pay | Admitting: *Deleted

## 2019-03-30 DIAGNOSIS — Z95810 Presence of automatic (implantable) cardiac defibrillator: Secondary | ICD-10-CM | POA: Diagnosis not present

## 2019-03-30 LAB — CUP PACEART REMOTE DEVICE CHECK
Battery Remaining Longevity: 79 mo
Battery Voltage: 2.97 V
Brady Statistic RV Percent Paced: 0.01 %
Date Time Interrogation Session: 20210226214434
HighPow Impedance: 70 Ohm
Implantable Lead Implant Date: 20150810
Implantable Lead Location: 753860
Implantable Lead Model: 6935
Implantable Pulse Generator Implant Date: 20150810
Lead Channel Impedance Value: 342 Ohm
Lead Channel Impedance Value: 418 Ohm
Lead Channel Pacing Threshold Amplitude: 0.625 V
Lead Channel Pacing Threshold Pulse Width: 0.4 ms
Lead Channel Sensing Intrinsic Amplitude: 5.875 mV
Lead Channel Sensing Intrinsic Amplitude: 5.875 mV
Lead Channel Setting Pacing Amplitude: 2 V
Lead Channel Setting Pacing Pulse Width: 0.4 ms
Lead Channel Setting Sensing Sensitivity: 0.3 mV

## 2019-03-30 MED ORDER — DIGOXIN 125 MCG PO TABS: 0.1250 mg | ORAL_TABLET | Freq: Every day | ORAL | 3 refills | Status: DC

## 2019-03-30 MED ORDER — CARVEDILOL 3.125 MG PO TABS: 3.1250 mg | ORAL_TABLET | Freq: Two times a day (BID) | ORAL | 3 refills | Status: DC

## 2019-03-30 MED ORDER — NITROGLYCERIN 0.4 MG SL SUBL: 0.4000 mg | SUBLINGUAL_TABLET | SUBLINGUAL | 6 refills | Status: DC | PRN

## 2019-03-30 MED ORDER — LISINOPRIL 2.5 MG PO TABS: 2.5000 mg | ORAL_TABLET | Freq: Every day | ORAL | 3 refills | Status: DC

## 2019-03-30 NOTE — Progress Notes (Signed)
ICD Remote  

## 2019-04-05 IMAGING — CR DG ABDOMEN 2V
2 series · 2 of 2 positions shown · non-contrast
Comparison: CT 01/14/2017

CLINICAL DATA: Diarrhea after colon surgery

EXAM:
ABDOMEN - 2 VIEW

[w abdomen upright]
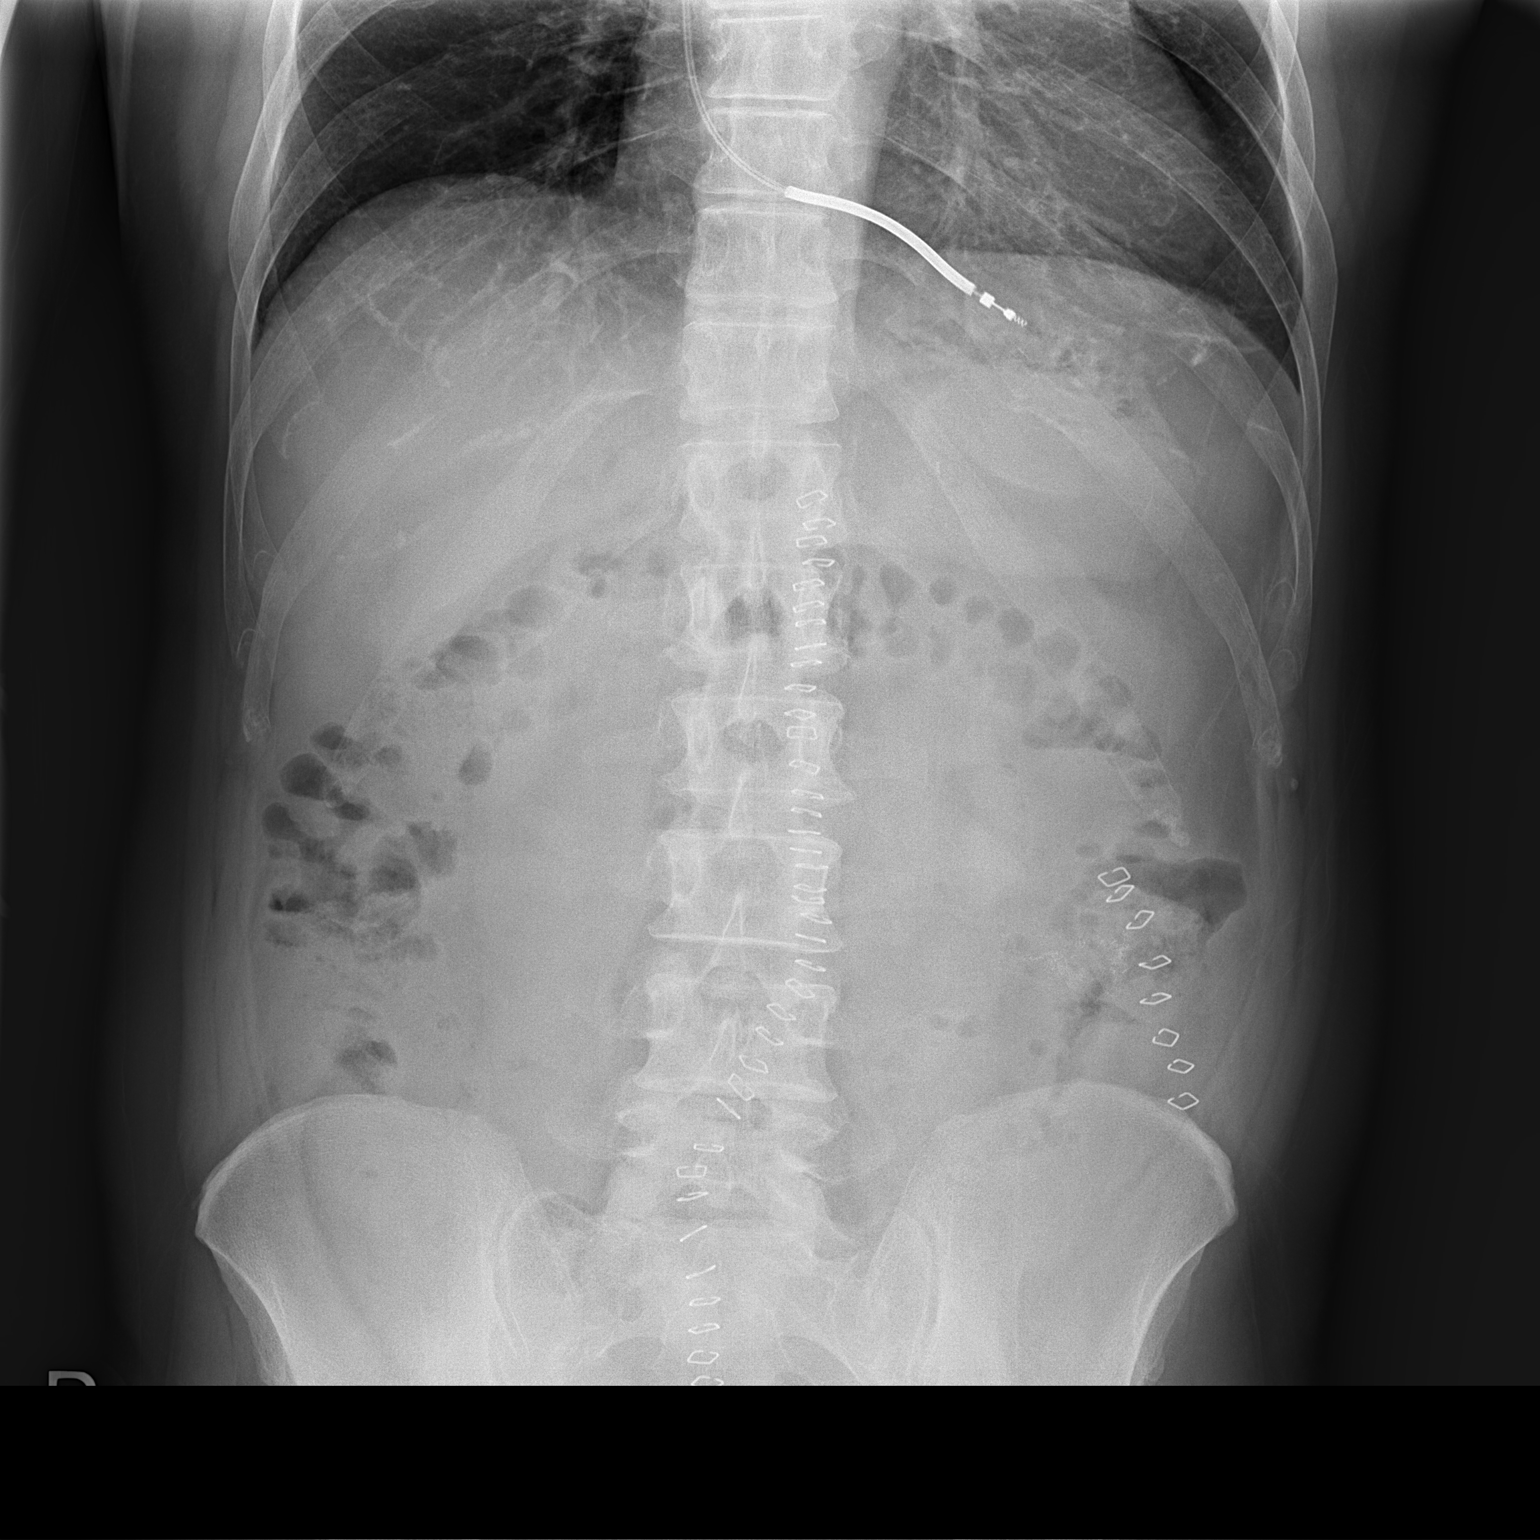

[t abdomen supine]
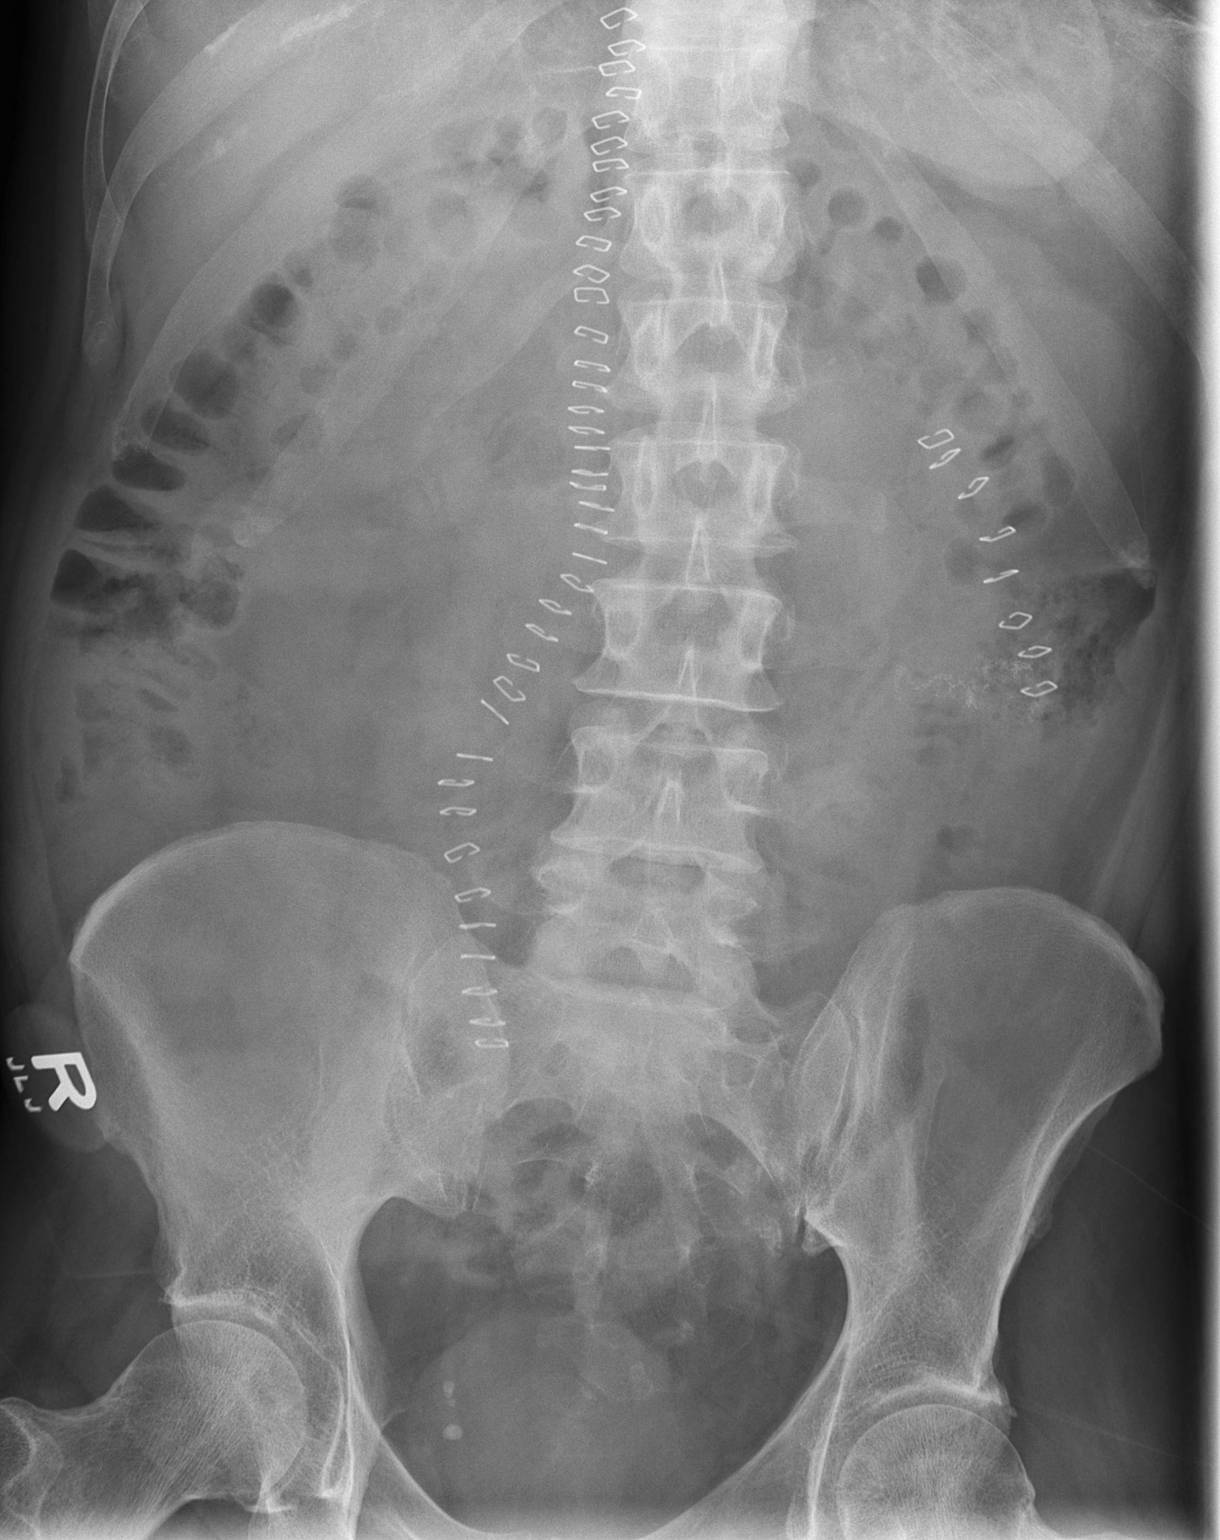

[2 of 2 positions shown; findings below may reference images not displayed]

FINDINGS: Lung bases are clear. No free air beneath the diaphragm.
Nonobstructed gas pattern with decreased central small bowel gas and
scattered colon gas. Postsurgical changes in the left hemiabdomen.
Cutaneous staples over the midline and left abdomen.
IMPRESSION: Nonobstructed bowel-gas pattern

## 2019-04-16 ENCOUNTER — Ambulatory Visit: Payer: Medicare Other | Attending: Internal Medicine

## 2019-04-16 DIAGNOSIS — Z23 Encounter for immunization: Secondary | ICD-10-CM

## 2019-04-16 NOTE — Progress Notes (Signed)
   Covid-19 Vaccination Clinic  Name:  Christopher [name]    MRN: [id number] DOB: 1960-09-24  04/16/2019  [name] was observed post Covid-19 immunization for 15 minutes without incident. He was provided with Vaccine Information Sheet and instruction to access the V-Safe system.   [name] was instructed to call 911 with any severe reactions post vaccine: Marland Kitchen Difficulty breathing  . Swelling of face and throat  . A fast heartbeat  . A bad rash all over body  . Dizziness and weakness   Immunizations Administered    Name Date Dose VIS Date Route   Pfizer COVID-19 Vaccine 04/16/2019  1:09 PM 0.3 mL 01/09/2019 Intramuscular   Manufacturer: [employer]   Lot: [id number]   [place]: [id number]

## 2019-04-28 NOTE — Telephone Encounter (Signed)
Per Dr Loletha Carrow direction, recall colon has been moved to 01-2020.

## 2019-05-11 ENCOUNTER — Ambulatory Visit: Payer: Medicare Other | Attending: Internal Medicine

## 2019-05-11 DIAGNOSIS — Z23 Encounter for immunization: Secondary | ICD-10-CM

## 2019-05-11 NOTE — Progress Notes (Signed)
   Covid-19 Vaccination Clinic  Name:  Christopher [name]    MRN: [id number] DOB: 11-08-1960  05/11/2019  [name] was observed post Covid-19 immunization for 15 minutes without incident. He was provided with Vaccine Information Sheet and instruction to access the V-Safe system.   [name] was instructed to call 911 with any severe reactions post vaccine: Marland Kitchen Difficulty breathing  . Swelling of face and throat  . A fast heartbeat  . A bad rash all over body  . Dizziness and weakness   Immunizations Administered    Name Date Dose VIS Date Route   Pfizer COVID-19 Vaccine 05/11/2019  1:52 PM 0.3 mL 01/09/2019 Intramuscular   Manufacturer: [employer], [employer]   Lot: [id number]   [place]: [id number]

## 2019-05-12 ENCOUNTER — Other Ambulatory Visit: Payer: Self-pay

## 2019-05-12 MED ORDER — CARVEDILOL 3.125 MG PO TABS: 3.1250 mg | ORAL_TABLET | Freq: Two times a day (BID) | ORAL | 3 refills | Status: DC

## 2019-05-24 ENCOUNTER — Encounter: Payer: Self-pay | Admitting: Hematology

## 2019-05-25 ENCOUNTER — Other Ambulatory Visit: Payer: Self-pay | Admitting: Hematology

## 2019-05-25 DIAGNOSIS — C186 Malignant neoplasm of descending colon: Secondary | ICD-10-CM

## 2019-06-29 LAB — CUP PACEART REMOTE DEVICE CHECK
Battery Remaining Longevity: 72 mo
Battery Voltage: 2.97 V
Brady Statistic RV Percent Paced: 0.01 %
Date Time Interrogation Session: 20210531043622
HighPow Impedance: 77 Ohm
Implantable Lead Implant Date: 20150810
Implantable Lead Location: 753860
Implantable Lead Model: 6935
Implantable Pulse Generator Implant Date: 20150810
Lead Channel Impedance Value: 399 Ohm
Lead Channel Impedance Value: 475 Ohm
Lead Channel Pacing Threshold Amplitude: 0.625 V
Lead Channel Pacing Threshold Pulse Width: 0.4 ms
Lead Channel Sensing Intrinsic Amplitude: 7.25 mV
Lead Channel Sensing Intrinsic Amplitude: 7.25 mV
Lead Channel Setting Pacing Amplitude: 2 V
Lead Channel Setting Pacing Pulse Width: 0.4 ms
Lead Channel Setting Sensing Sensitivity: 0.3 mV

## 2019-06-30 ENCOUNTER — Ambulatory Visit (INDEPENDENT_AMBULATORY_CARE_PROVIDER_SITE_OTHER): Payer: Medicare Other | Admitting: *Deleted

## 2019-06-30 DIAGNOSIS — I255 Ischemic cardiomyopathy: Secondary | ICD-10-CM | POA: Diagnosis not present

## 2019-06-30 NOTE — Progress Notes (Signed)
Remote ICD transmission.   

## 2019-07-09 ENCOUNTER — Ambulatory Visit (HOSPITAL_COMMUNITY)
Admission: RE | Admit: 2019-07-09 | Discharge: 2019-07-09 | Disposition: A | Discharge status: Home / Self Care | Payer: Medicare Other | Source: Ambulatory Visit | Attending: Hematology | Admitting: Hematology

## 2019-07-09 ENCOUNTER — Other Ambulatory Visit: Payer: Self-pay

## 2019-07-09 ENCOUNTER — Inpatient Hospital Stay: Payer: Medicare Other | Attending: Hematology

## 2019-07-09 ENCOUNTER — Encounter (HOSPITAL_COMMUNITY): Payer: Self-pay

## 2019-07-09 DIAGNOSIS — Z9049 Acquired absence of other specified parts of digestive tract: Secondary | ICD-10-CM | POA: Diagnosis not present

## 2019-07-09 DIAGNOSIS — Z87891 Personal history of nicotine dependence: Secondary | ICD-10-CM | POA: Insufficient documentation

## 2019-07-09 DIAGNOSIS — M47816 Spondylosis without myelopathy or radiculopathy, lumbar region: Secondary | ICD-10-CM | POA: Insufficient documentation

## 2019-07-09 DIAGNOSIS — Z933 Colostomy status: Secondary | ICD-10-CM | POA: Diagnosis not present

## 2019-07-09 DIAGNOSIS — Z886 Allergy status to analgesic agent status: Secondary | ICD-10-CM | POA: Diagnosis not present

## 2019-07-09 DIAGNOSIS — R21 Rash and other nonspecific skin eruption: Secondary | ICD-10-CM | POA: Diagnosis not present

## 2019-07-09 DIAGNOSIS — Z7901 Long term (current) use of anticoagulants: Secondary | ICD-10-CM | POA: Diagnosis not present

## 2019-07-09 DIAGNOSIS — Z7902 Long term (current) use of antithrombotics/antiplatelets: Secondary | ICD-10-CM | POA: Insufficient documentation

## 2019-07-09 DIAGNOSIS — Z86010 Personal history of colonic polyps: Secondary | ICD-10-CM | POA: Diagnosis not present

## 2019-07-09 DIAGNOSIS — I130 Hypertensive heart and chronic kidney disease with heart failure and stage 1 through stage 4 chronic kidney disease, or unspecified chronic kidney disease: Secondary | ICD-10-CM | POA: Insufficient documentation

## 2019-07-09 DIAGNOSIS — M5136 Other intervertebral disc degeneration, lumbar region: Secondary | ICD-10-CM | POA: Insufficient documentation

## 2019-07-09 DIAGNOSIS — R200 Anesthesia of skin: Secondary | ICD-10-CM | POA: Diagnosis not present

## 2019-07-09 DIAGNOSIS — Z79899 Other long term (current) drug therapy: Secondary | ICD-10-CM | POA: Diagnosis not present

## 2019-07-09 DIAGNOSIS — C186 Malignant neoplasm of descending colon: Secondary | ICD-10-CM

## 2019-07-09 DIAGNOSIS — I2510 Atherosclerotic heart disease of native coronary artery without angina pectoris: Secondary | ICD-10-CM | POA: Insufficient documentation

## 2019-07-09 DIAGNOSIS — C189 Malignant neoplasm of colon, unspecified: Secondary | ICD-10-CM

## 2019-07-09 DIAGNOSIS — I252 Old myocardial infarction: Secondary | ICD-10-CM | POA: Insufficient documentation

## 2019-07-09 DIAGNOSIS — J439 Emphysema, unspecified: Secondary | ICD-10-CM | POA: Insufficient documentation

## 2019-07-09 DIAGNOSIS — D500 Iron deficiency anemia secondary to blood loss (chronic): Secondary | ICD-10-CM

## 2019-07-09 DIAGNOSIS — I700 Atherosclerosis of aorta: Secondary | ICD-10-CM | POA: Diagnosis not present

## 2019-07-09 DIAGNOSIS — K5730 Diverticulosis of large intestine without perforation or abscess without bleeding: Secondary | ICD-10-CM | POA: Diagnosis not present

## 2019-07-09 DIAGNOSIS — C20 Malignant neoplasm of rectum: Secondary | ICD-10-CM | POA: Diagnosis not present

## 2019-07-09 LAB — IRON AND TIBC
Iron: 71 ug/dL (ref 42–163)
Saturation Ratios: 22 % (ref 20–55)
TIBC: 318 ug/dL (ref 202–409)
UIBC: 247 ug/dL (ref 117–376)

## 2019-07-09 LAB — CBC WITH DIFFERENTIAL/PLATELET
Abs Immature Granulocytes: 0.03 10*3/uL (ref 0.00–0.07)
Basophils Absolute: 0 10*3/uL (ref 0.0–0.1)
Basophils Relative: 1 %
Eosinophils Absolute: 0.2 10*3/uL (ref 0.0–0.5)
Eosinophils Relative: 3 %
HCT: 44.8 % (ref 39.0–52.0)
Hemoglobin: 14.7 g/dL (ref 13.0–17.0)
Immature Granulocytes: 0 %
Lymphocytes Relative: 31 %
Lymphs Abs: 2.1 10*3/uL (ref 0.7–4.0)
MCH: 30.2 pg (ref 26.0–34.0)
MCHC: 32.8 g/dL (ref 30.0–36.0)
MCV: 92.2 fL (ref 80.0–100.0)
Monocytes Absolute: 0.7 10*3/uL (ref 0.1–1.0)
Monocytes Relative: 10 %
Neutro Abs: 3.9 10*3/uL (ref 1.7–7.7)
Neutrophils Relative %: 55 %
Platelets: 129 10*3/uL — ABNORMAL LOW (ref 150–400)
RBC: 4.86 MIL/uL (ref 4.22–5.81)
RDW: 14 % (ref 11.5–15.5)
WBC: 6.8 10*3/uL (ref 4.0–10.5)
nRBC: 0 % (ref 0.0–0.2)

## 2019-07-09 LAB — COMPREHENSIVE METABOLIC PANEL
ALT: 27 U/L (ref 0–44)
AST: 22 U/L (ref 15–41)
Albumin: 4.4 g/dL (ref 3.5–5.0)
Alkaline Phosphatase: 64 U/L (ref 38–126)
Anion gap: 10 (ref 5–15)
BUN: 26 mg/dL — ABNORMAL HIGH (ref 6–20)
CO2: 25 mmol/L (ref 22–32)
Calcium: 9.7 mg/dL (ref 8.9–10.3)
Chloride: 104 mmol/L (ref 98–111)
Creatinine, Ser: 1.42 mg/dL — ABNORMAL HIGH (ref 0.61–1.24)
GFR calc Af Amer: >60 mL/min (ref >60)
GFR calc non Af Amer: 55 mL/min — ABNORMAL LOW (ref >60)
Glucose, Bld: 103 mg/dL — ABNORMAL HIGH (ref 70–99)
Potassium: 4.8 mmol/L (ref 3.5–5.1)
Sodium: 139 mmol/L (ref 135–145)
Total Bilirubin: 0.4 mg/dL (ref 0.3–1.2)
Total Protein: 7.6 g/dL (ref 6.5–8.1)

## 2019-07-09 LAB — CEA (IN HOUSE-CHCC): CEA (CHCC-In House): 5.4 ng/mL — ABNORMAL HIGH (ref 0.00–5.00)

## 2019-07-09 LAB — FERRITIN: Ferritin: 61 ng/mL (ref 24–336)

## 2019-07-13 ENCOUNTER — Other Ambulatory Visit: Payer: Self-pay

## 2019-07-13 ENCOUNTER — Inpatient Hospital Stay (HOSPITAL_BASED_OUTPATIENT_CLINIC_OR_DEPARTMENT_OTHER): Payer: Medicare Other | Admitting: Hematology

## 2019-07-13 ENCOUNTER — Encounter: Payer: Self-pay | Admitting: Hematology

## 2019-07-13 ENCOUNTER — Telehealth: Payer: Self-pay | Admitting: Hematology

## 2019-07-13 VITALS — BP 125/99 | HR 88 | Temp 98.1°F | Resp 18 | Wt 177.2 lb

## 2019-07-13 DIAGNOSIS — C186 Malignant neoplasm of descending colon: Secondary | ICD-10-CM | POA: Diagnosis not present

## 2019-07-13 DIAGNOSIS — I2510 Atherosclerotic heart disease of native coronary artery without angina pectoris: Secondary | ICD-10-CM

## 2019-07-13 DIAGNOSIS — I255 Ischemic cardiomyopathy: Secondary | ICD-10-CM | POA: Diagnosis not present

## 2019-07-13 NOTE — Telephone Encounter (Signed)
Scheduled appt per 6/14 los - gave patient AVS and calender per los.

## 2019-07-13 NOTE — Progress Notes (Signed)
[place]   [phone] [phone]   Clinic Follow up Note   Patient Care Team: Patient, No Pcp Per as PCP - General ([facility]) Burnell Blanks, MD as PCP - Cardiology (Cardiology) [name], MD as Consulting Physician (Cardiology) [name], MD as Consulting Physician (Cardiology) [name], MD as Consulting Physician (Hematology) [name], MD as Consulting Physician (General Surgery)  Date of Service:  07/13/2019  CHIEF COMPLAINT: F/u on left side colon cancer, stage IIIB  SUMMARY OF ONCOLOGIC HISTORY: Oncology History Overview Note  Cancer Staging Adenocarcinoma of descending colon  pT3, pN1a, pMX s/p colectomy/ostomy 05/18/2016 Staging form: Colon and Rectum, AJCC 8th Edition - Pathologic stage from 05/18/2016: Stage IIIB (pT3, pN1a, cM0) - Signed by [name], MD on 07/01/2016     Adenocarcinoma of descending colon  pT3, pN1a, pMX s/p colectomy/ostomy 05/18/2016  05/09/2016 Imaging   CT chest, abdomen and pelvis showed probable descending colon carcinoma. No evidence of distant metastasis. 7 cm hypervascular focus in the upper liver, not typical for metastatic disease.   05/16/2016 Procedure   Colonoscopy showed a large mass which completely obstructss the distal descending colon. Biopsied. Diverticulosis in the left colon.   05/18/2016 Surgery   Left hemicolectomy by Dr. [name]   05/18/2016 Pathology Results   Left colon segmental resection shows invasive colorectal adenocarcinoma, 4.6 cm, tumor extends into subserosa connective tissue, margins are negative, metastatic carcinoma in 1 of 12 lymph nodes. Grade 2, lymphovascular invasion present, perineural invasion negative.   05/18/2016 Miscellaneous   MSI-stable    05/18/2016 Initial Diagnosis   Adenocarcinoma of descending colon  pT3, pN1a, pMX s/p colectomy/ostomy 05/18/2016   07/16/2016 - 08/22/2016 Chemotherapy   Xeloda twice a day for 2 weeks on and  1 week off.  stopped after 3 days due to severe diarrhea, and changed to 153m bid, started on 07/31/2016; Hold Xeloda starting 08/22/16 due to AKI. Will lower Xeloda to 3 in the morning and 2 in the evening when restart next cycle.   Stopped due to poor tolerance of sever diarrhea.     01/14/2017 Imaging   CT CAP W Contrast 01/14/17  IMPRESSION: 1. Stable exam. No new or progressive findings in the chest, abdomen, or pelvis. The tiny hypervascular focus in the dome of the liver seen on the previous study is not evident on today's exam. No evidence for metastatic disease on today's CT scan. 2.  Aortic Atherosclerois (ICD10-170.0) 3. Distal transverse end colostomy. 4.  Emphysema. (ICD10-J43.9)   04/01/2017 Procedure   04/01/2017 - One 6 mm polyp in the cecum, removed with a cold snare. Resected and retrieved. - Diverticulosis in the left colon.   04/01/2017 Pathology Results   Colon, polyp(s), cecal cold snare - TUBULAR ADENOMA (ONE FRAGMENT). - NO HIGH GRADE DYSPLASIA OR MALIGNANCY.   05/20/2017 Surgery   Surgery:05/20/2017 COLOSTOMY TAKEDOWN BGeorganna SkeansMD    05/20/2017 Pathology Results   Pathology: 05/20/2017 Stoma - FINDINGS CONSISTENT WITH OSTOMY. - NO DYSPLASIA OR MALIGNANCY.    06/02/2017 Imaging   06/02/2017 CT AP IMPRESSION: 1. Postsurgical wounds in the anterior abdominal wall with skin staples and edema. No dehiscence or discrete fluid collection. 2. Partial colectomy with anastomosis in left hemiabdomen. Mild edema surrounding site of anastomosis, probably postsurgical changes. 3. No abscess, bowel wall thickening, or obstruction identified.   06/12/2017 Imaging   06/12/2017 CT AP IMPRESSION: Improving appearance to what may have represented a focal anastomotic leak posterior to the left  colon at the level of reanastomosis after colostomy takedown. An area of probable extraluminal air and fluid is smaller and contains less extraluminal air. This is likely  consistent with resolving abscess. Some residual inflammation remains in this region. No associated bowel obstruction or free air.   06/09/2018 Imaging   CT CAP WO contrast  IMPRESSION: Stable exam. No evidence of recurrent or metastatic carcinoma within the chest, abdomen, or pelvis.  Aortic Atherosclerosis (ICD10-I70.0). Coronary artery atherosclerosis.   07/09/2019 Imaging   CT CAP  IMPRESSION: 1. No findings of recurrent malignancy. 2. Other imaging findings of potential clinical significance: Coronary atherosclerosis with left ventricular predominance. Cardiomegaly with left ventricular predominance. Fat density in the left ventricular myocardium along the anterior wall, septum, and apex compatible with prior myocardial infarction. Sigmoid colon diverticulosis. Lower lumbar spondylosis and degenerative disc disease. 3. Aortic atherosclerosis.   Aortic Atherosclerosis (ICD10-I70.0).      CURRENT THERAPY:  Surveillance  INTERVAL HISTORY:  Christopher [name] is here for a follow up of colon cancer. He was last seen by me 5 months. He presents to the clinic alone. He notes 3 weeks ago he started feeling tightness of his stomach (LUQ) when he eats and feels thumping/pulsing in that area and then moves down his bowel with bubbling until he has diarrhea then his BM goes back to normal. He notes due to this he does not want to eat as much.  He notes recent nocturia with no burning, pain or change in appearance. He notes he has skin rash 3 weeks after first COVID19 injection. He notes after second injection he had numbness of his left leg for 1 hour. He notes he is very apprehensive about another abdominal surgery.    REVIEW OF SYSTEMS:   Constitutional: Denies fevers, chills or abnormal weight loss Eyes: Denies blurriness of vision Ears, nose, mouth, throat, and face: Denies mucositis or sore throat Respiratory: Denies cough, dyspnea or wheezes UA: (+) Nocturia  Cardiovascular:  Denies palpitation, chest discomfort or lower extremity swelling Gastrointestinal:  Denies nausea, heartburn or change in bowel habits (+) LUQ tightness post prandial  Skin: Denies abnormal skin rashes Lymphatics: Denies new lymphadenopathy or easy bruising Neurological:Denies numbness, tingling or new weaknesses Behavioral/Psych: Mood is stable, no new changes  All other systems were reviewed with the patient and are negative.  MEDICAL HISTORY:  Past Medical History:  Diagnosis Date  . Adenocarcinoma of descending colon  pT3, pN1a, pMX s/p colectomy/ostomy 05/18/2016 05/26/2016  . AICD (automatic cardioverter/defibrillator) present    09/07/13 Dr. Lovena Le post MI with EF 30% RV lead      Medtronic 6947 (serial number [id number]) Generator   Medtronic  Evera XT VR (serial Number [id number])   . Anemia   . Anemia due to blood loss 05/03/2016  . Anxiety   . Anxiety state 01/17/2015  . Automatic implantable cardioverter-defibrillator in situ    MDT Aug 2015 Dr. Lovena Le  . Benign neoplasm of cecum   . CAD (coronary artery disease), native coronary artery 07/03/2013   Cath 06/20/13  Normal left main, occluded LAD, occluded RCA, 50% circ EF 15%  3.0 x28 and 3.0 x 8 mm Xience stent Dr. Tamala Julian  To LAD   . Cancer ([place])   . Cardiomyopathy, ischemic   . Chronic anticoagulation 06/12/2017  . Chronic kidney disease   . Chronic systolic CHF (congestive heart failure) (H[facility])    ECHO 08/05/13  EF 30%  Anterior akinesis and inferior hypokinesis   . COPD (chronic obstructive  pulmonary disease) ([place])   . Essential hypertension 05/16/2016  . Financial difficulties 10/01/2013  . History of colon cancer   . Hyperlipidemia   . Intra-abdominal abscess 06/12/2017  . Iron deficiency anemia due to chronic blood loss 07/02/2016  . Mass of colon 04/2016  . Myocardial infarction ([place]) 04/29/2013  . Old anterior myocardial infarction 06/20/2013   per pt 12/22/2014 had an MI  . Partial bowel obstruction ([place]) 05/16/2016  . S/P  colostomy takedown 05/20/2017  . Thrombocytopenia ([place]) 12/27/2014   Chronic    . Tobacco use 07/03/2013    SURGICAL HISTORY: Past Surgical History:  Procedure Laterality Date  . CARDIAC CATHETERIZATION  05/2013   . CARDIAC CATHETERIZATION N/A 12/28/2014   Procedure: Left Heart Cath and Coronary Angiography;  Surgeon: [name], MD;  Location: [facility];  Service: Cardiovascular;  Laterality: N/A;  . CARDIAC CATHETERIZATION N/A 12/28/2014   Procedure: Coronary Stent Intervention;  Surgeon: [name], MD;  Location: [facility];  Service: Cardiovascular;  Laterality: N/A;  . COLECTOMY WITH COLOSTOMY CREATION/HARTMANN PROCEDURE Left 05/18/2016   Procedure: COLECTOMY WITH OSTOMY CREATION/HARTMANN PROCEDURE;  Surgeon: [name], MD;  Location: [facility];  Service: General;  Laterality: Left;  . COLONOSCOPY    . COLONOSCOPY WITH PROPOFOL N/A 04/01/2017   Procedure: COLONOSCOPY WITH PROPOFOL;  Surgeon: [name], MD;  Location: [facility];  Service: Gastroenterology;  Laterality: N/A;  . COLOSTOMY TAKEDOWN N/A 05/20/2017   Procedure: COLOSTOMY TAKEDOWN;  Surgeon: [name], MD;  Location: [facility];  Service: General;  Laterality: N/A;  . CORONARY ANGIOPLASTY  05/2013   . FLEXIBLE SIGMOIDOSCOPY N/A 05/16/2016   Procedure: FLEXIBLE SIGMOIDOSCOPY;  Surgeon: [name], MD;  Location: [facility];  Service: Gastroenterology;  Laterality: N/A;  . HAND SURGERY Right    cyst removal  . ICD placement  09/07/2013   . IMPLANTABLE CARDIOVERTER DEFIBRILLATOR IMPLANT N/A 09/07/2013   Procedure: IMPLANTABLE CARDIOVERTER DEFIBRILLATOR IMPLANT;  Surgeon: [name], MD;  Location: [facility] CATH LAB;  Service: Cardiovascular;  Laterality: N/A;  . INTRA-AORTIC BALLOON PUMP INSERTION  06/20/2013   Procedure: INTRA-AORTIC BALLOON PUMP INSERTION;  Surgeon: Sinclair Grooms, MD;  Location: [facility] CATH LAB;  Service: Cardiovascular;;  . LEFT HEART CATHETERIZATION WITH CORONARY  ANGIOGRAM N/A 06/20/2013   Procedure: LEFT HEART CATHETERIZATION WITH CORONARY ANGIOGRAM;  Surgeon: Sinclair Grooms, MD;  Location: [facility] CATH LAB;  Service: Cardiovascular;  Laterality: N/A;  . PERCUTANEOUS CORONARY STENT INTERVENTION (PCI-S)  06/20/2013   Procedure: PERCUTANEOUS CORONARY STENT INTERVENTION (PCI-S);  Surgeon: Sinclair Grooms, MD;  Location: [facility] CATH LAB;  Service: Cardiovascular;;    I have reviewed the social history and family history with the patient and they are unchanged from previous note.  ALLERGIES:  is allergic to capecitabine, celebrex [celecoxib], and contrast media [iodinated diagnostic agents].  MEDICATIONS:  Current Outpatient Medications  Medication Sig Dispense Refill  . aspirin 81 MG chewable tablet Chew 1 tablet (81 mg total) by mouth daily. 30 tablet 5  . atorvastatin (LIPITOR) 80 MG tablet TAKE 1 TABLET BY MOUTH ONCE DAILY AT  6  PM 90 tablet 3  . carvedilol (COREG) 3.125 MG tablet Take 1 tablet (3.125 mg total) by mouth 2 (two) times daily with a meal. 180 tablet 3  . clopidogrel (PLAVIX) 75 MG tablet Take 1 tablet (75 mg total) by mouth daily. 90 tablet 3  . digoxin (LANOXIN) 0.125 MG tablet Take 1 tablet (0.125  mg total) by mouth daily. 90 tablet 3  . lisinopril (ZESTRIL) 2.5 MG tablet Take 1 tablet (2.5 mg total) by mouth daily. 90 tablet 3  . Multiple Vitamin (MULTIVITAMIN WITH MINERALS) TABS tablet Take 1 tablet by mouth daily. 30 tablet 0  . nitroGLYCERIN (NITROSTAT) 0.4 MG SL tablet Place 1 tablet (0.4 mg total) under the tongue every 5 (five) minutes as needed for chest pain. 25 tablet 6  . PEG-KCl-NaCl-NaSulf-Na Asc-C (PLENVU) 140 g SOLR Take 1 kit by mouth as directed. 1 each 0  . saccharomyces boulardii (FLORASTOR) 250 MG capsule Take 1 capsule (250 mg total) by mouth 2 (two) times daily. 30 capsule 0   No current facility-administered medications for this visit.    PHYSICAL EXAMINATION: ECOG PERFORMANCE STATUS: 1 - Symptomatic but  completely ambulatory  Vitals:   07/13/19 1015  BP: (!) 125/99  Pulse: 88  Resp: 18  Temp: 98.1 F (36.7 C)  SpO2: 96%   Filed Weights   07/13/19 1015  Weight: 177 lb 3.2 oz (80.4 kg)    GENERAL:alert, no distress and comfortable SKIN: skin color, texture, turgor are normal, no rashes or significant lesions EYES: normal, Conjunctiva are pink and non-injected, sclera clear  NECK: supple, thyroid normal size, non-tender, without nodularity LYMPH:  no palpable lymphadenopathy in the cervical, axillary  LUNGS: clear to auscultation and percussion with normal breathing effort HEART: regular rate & rhythm and no murmurs and no lower extremity edema ABDOMEN:abdomen soft, non-tender and normal bowel sounds (+) Mild protrusion of LUQ (+) Midline and Left abdominal surgical incisions healed well.   Musculoskeletal:no cyanosis of digits and no clubbing  NEURO: alert & oriented x 3 with fluent speech, no focal motor/sensory deficits  LABORATORY DATA:  I have reviewed the data as listed CBC Latest Ref Rng & Units 07/09/2019 10/13/2018 06/09/2018  WBC 4.0 - 10.5 K/uL 6.8 6.1 6.7  Hemoglobin 13.0 - 17.0 g/dL 14.7 13.8 13.4  Hematocrit 39 - 52 % 44.8 40.7 40.6  Platelets 150 - 400 K/uL 129(L) 105(L) 126(L)     CMP Latest Ref Rng & Units 07/09/2019 10/13/2018 06/09/2018  Glucose 70 - 99 mg/dL 103(H) 108(H) 106(H)  BUN 6 - 20 mg/dL 26(H) 22(H) 22(H)  Creatinine 0.61 - 1.24 mg/dL 1.42(H) 1.28(H) 1.19  Sodium 135 - 145 mmol/L 139 139 139  Potassium 3.5 - 5.1 mmol/L 4.8 4.8 4.4  Chloride 98 - 111 mmol/L 104 105 105  CO2 22 - 32 mmol/L _0 Calcium 8.9 - 10.3 mg/dL 9.7 9.7 8.8(L)  Total Protein 6.5 - 8.1 g/dL 7.6 6.9 7.0  Total Bilirubin 0.3 - 1.2 mg/dL 0.4 0.4 0.2(L)  Alkaline Phos 38 - 126 U/L 64 58 53  AST 15 - 41 U/L _1 ALT 0 - 44 U/L 27 22 32      RADIOGRAPHIC STUDIES: I have personally reviewed the radiological images as listed and agreed with the findings in the  report. No results found.   ASSESSMENT & PLAN:  SHAUGHN THOMLEY is a 59 y.o. male with    1. Invasive Colorectal Adenocarcinoma of the Left Colon, pT3N1aM0, Stage IIIB, MSI-stable -Diagnosed in 04/2016. Treated with left hemicolectomy andadjuvantchemo. He is currently on surveillance. -He states that he previously had a bad reaction toCT ivcontrast.Will avoid in the future. -His last colonoscopy was in 03/2017 and had 1 small benign polyp removed. His scheduled repeat in 2020 was cancelled due to [place]. I recommend he proceed in 2022. He  is agreeable.  -We discussed his CT CAP from 07/09/19 which showed No evidence of recurrent malignancy. Scan also shows stable nodule in omentum, like benign. I will probably repeat scan again in 6 month for close follow up. His CT scan shows new diverticulosis. I discussed watching for GI bleeding or infection from diverticulosis. No surgery is indicated at this time.  -He is clinically doing well. Labs reviewed from last week, CBC and CMP WNL except plt 129K, BG 103, BIN 26, Cr 1.42. CEA at 5.40. Physical exam shows mild protrusion of LUQ. His CEA is slightly above normal range, he is a non-smoker, will repeat in 3 months  -He is 3 year since his cancer diagnosis. His risk of recurrence has significantly decreased. Given elevated tumor marker his next f/u with be closer.  -He has concerns this is related to his Digoxin mication. I recommend he check his levels with his Cardiologist.   -I answered all his questions to his understanding and satisfaction.  -f/u in 3 months    2. CAD and systolic CHF with EF 54-49% -s/p 2 stent placements and a pace maker. -On Plavix  -Continue to f/u with cardiology -His CT CAP from 06/09/18 showed incidental finding of coronary artery atherosclerosis. I recommend him to discuss with cardiology to see if he needs stress test  3. COPD -He previously quit smoking. Stable.   4. Likely LUQ hernia  -In the past 3 weeks  he has had postprandial LUQ tightness, bloating with pulsing/thumping sensations.  -I discussed given his prior colostomy bag in LUQ he may have hernia there.  -I encouraged him to use belt as needed and f/u with his surgeon. I also recommend he eat smaller meals.    Plan -CT CAP reviewed, NED, stable 0.9 cm omental nodule likely benign -F/u in 3 months with lab a few days before, will watch his CEA closely    No problem-specific Assessment & Plan notes found for this encounter.   No orders of the defined types were placed in this encounter.  All questions were answered. The patient knows to call the clinic with any problems, questions or concerns. No barriers to learning was detected. The total time spent in the appointment was 30 minutes.     [name], MD 07/13/2019   I, Joslyn Devon, am acting as scribe for [name], MD.   I have reviewed the above documentation for accuracy and completeness, and I agree with the above.

## 2019-08-11 ENCOUNTER — Other Ambulatory Visit: Payer: Self-pay | Admitting: Cardiovascular Disease

## 2019-08-11 MED ORDER — ATORVASTATIN CALCIUM 80 MG PO TABS: ORAL_TABLET | ORAL | 0 refills | Status: DC

## 2019-08-13 ENCOUNTER — Other Ambulatory Visit: Payer: Self-pay | Admitting: Cardiovascular Disease

## 2019-09-11 ENCOUNTER — Other Ambulatory Visit: Payer: Self-pay

## 2019-09-11 MED ORDER — CLOPIDOGREL BISULFATE 75 MG PO TABS: 75.0000 mg | ORAL_TABLET | Freq: Every day | ORAL | 0 refills | Status: DC

## 2019-09-28 ENCOUNTER — Ambulatory Visit (INDEPENDENT_AMBULATORY_CARE_PROVIDER_SITE_OTHER): Payer: Medicare Other | Admitting: *Deleted

## 2019-09-28 DIAGNOSIS — I5022 Chronic systolic (congestive) heart failure: Secondary | ICD-10-CM

## 2019-09-28 DIAGNOSIS — I255 Ischemic cardiomyopathy: Secondary | ICD-10-CM

## 2019-09-28 LAB — CUP PACEART REMOTE DEVICE CHECK
Battery Remaining Longevity: 68 mo
Battery Voltage: 2.97 V
Brady Statistic RV Percent Paced: 0.01 %
Date Time Interrogation Session: 20210830043823
HighPow Impedance: 68 Ohm
Implantable Lead Implant Date: 20150810
Implantable Lead Location: 753860
Implantable Lead Model: 6935
Implantable Pulse Generator Implant Date: 20150810
Lead Channel Impedance Value: 342 Ohm
Lead Channel Impedance Value: 456 Ohm
Lead Channel Pacing Threshold Amplitude: 0.5 V
Lead Channel Pacing Threshold Pulse Width: 0.4 ms
Lead Channel Sensing Intrinsic Amplitude: 6.5 mV
Lead Channel Sensing Intrinsic Amplitude: 6.5 mV
Lead Channel Setting Pacing Amplitude: 2 V
Lead Channel Setting Pacing Pulse Width: 0.4 ms
Lead Channel Setting Sensing Sensitivity: 0.3 mV

## 2019-09-30 NOTE — Progress Notes (Signed)
Remote ICD transmission.   

## 2019-10-12 NOTE — Progress Notes (Signed)
[place]   [phone] Fax:(336) [phone]   Clinic Follow up Note   Patient Care Team: Patient, No Pcp Per as PCP - General ([place]) Burnell Blanks, MD as PCP - Cardiology (Cardiology) [name], MD as Consulting Physician (Cardiology) [name], MD as Consulting Physician (Cardiology) [name], MD as Consulting Physician (Hematology) [name], MD as Consulting Physician (General Surgery)  Date of Service:  10/15/2019  CHIEF COMPLAINT: F/u on left side colon cancer, stage IIIB  SUMMARY OF ONCOLOGIC HISTORY: Oncology History Overview Note  Cancer Staging Adenocarcinoma of descending colon  pT3, pN1a, pMX s/p colectomy/ostomy 05/18/2016 Staging form: Colon and Rectum, AJCC 8th Edition - Pathologic stage from 05/18/2016: Stage IIIB (pT3, pN1a, cM0) - Signed by [name], MD on 07/01/2016     Adenocarcinoma of descending colon  pT3, pN1a, pMX s/p colectomy/ostomy 05/18/2016  05/09/2016 Imaging   CT chest, abdomen and pelvis showed probable descending colon carcinoma. No evidence of distant metastasis. 7 cm hypervascular focus in the upper liver, not typical for metastatic disease.   05/16/2016 Procedure   Colonoscopy showed a large mass which completely obstructss the distal descending colon. Biopsied. Diverticulosis in the left colon.   05/18/2016 Surgery   Left hemicolectomy by Dr. [name]   05/18/2016 Pathology Results   Left colon segmental resection shows invasive colorectal adenocarcinoma, 4.6 cm, tumor extends into subserosa connective tissue, margins are negative, metastatic carcinoma in 1 of 12 lymph nodes. Grade 2, lymphovascular invasion present, perineural invasion negative.   05/18/2016 Miscellaneous   MSI-stable    05/18/2016 Initial Diagnosis   Adenocarcinoma of descending colon  pT3, pN1a, pMX s/p colectomy/ostomy 05/18/2016   07/16/2016 - 08/22/2016 Chemotherapy   Xeloda twice a day for 2 weeks on and  1 week off.  stopped after 3 days due to severe diarrhea, and changed to 1525m bid, started on 07/31/2016; Hold Xeloda starting 08/22/16 due to AKI. Will lower Xeloda to 3 in the morning and 2 in the evening when restart next cycle.   Stopped due to poor tolerance of sever diarrhea.     01/14/2017 Imaging   CT CAP W Contrast 01/14/17  IMPRESSION: 1. Stable exam. No new or progressive findings in the chest, abdomen, or pelvis. The tiny hypervascular focus in the dome of the liver seen on the previous study is not evident on today's exam. No evidence for metastatic disease on today's CT scan. 2.  Aortic Atherosclerois (ICD10-170.0) 3. Distal transverse end colostomy. 4.  Emphysema. (ICD10-J43.9)   04/01/2017 Procedure   04/01/2017 - One 6 mm polyp in the cecum, removed with a cold snare. Resected and retrieved. - Diverticulosis in the left colon.   04/01/2017 Pathology Results   Colon, polyp(s), cecal cold snare - TUBULAR ADENOMA (ONE FRAGMENT). - NO HIGH GRADE DYSPLASIA OR MALIGNANCY.   05/20/2017 Surgery   Surgery:05/20/2017 COLOSTOMY TAKEDOWN BGeorganna SkeansMD    05/20/2017 Pathology Results   Pathology: 05/20/2017 Stoma - FINDINGS CONSISTENT WITH OSTOMY. - NO DYSPLASIA OR MALIGNANCY.    06/02/2017 Imaging   06/02/2017 CT AP IMPRESSION: 1. Postsurgical wounds in the anterior abdominal wall with skin staples and edema. No dehiscence or discrete fluid collection. 2. Partial colectomy with anastomosis in left hemiabdomen. Mild edema surrounding site of anastomosis, probably postsurgical changes. 3. No abscess, bowel wall thickening, or obstruction identified.   06/12/2017 Imaging   06/12/2017 CT AP IMPRESSION: Improving appearance to what may have represented a focal anastomotic leak posterior to the left  colon at the level of reanastomosis after colostomy takedown. An area of probable extraluminal air and fluid is smaller and contains less extraluminal air. This is likely  consistent with resolving abscess. Some residual inflammation remains in this region. No associated bowel obstruction or free air.   06/09/2018 Imaging   CT CAP WO contrast  IMPRESSION: Stable exam. No evidence of recurrent or metastatic carcinoma within the chest, abdomen, or pelvis.  Aortic Atherosclerosis (ICD10-I70.0). Coronary artery atherosclerosis.   07/09/2019 Imaging   CT CAP  IMPRESSION: 1. No findings of recurrent malignancy. 2. Other imaging findings of potential clinical significance: Coronary atherosclerosis with left ventricular predominance. Cardiomegaly with left ventricular predominance. Fat density in the left ventricular myocardium along the anterior wall, septum, and apex compatible with prior myocardial infarction. Sigmoid colon diverticulosis. Lower lumbar spondylosis and degenerative disc disease. 3. Aortic atherosclerosis.   Aortic Atherosclerosis (ICD10-I70.0).      CURRENT THERAPY:  Surveillance  INTERVAL HISTORY:  TAHJ NJOKU is here for a follow up of colon cancer. He presents to the clinic alone.  Feels well overall, has a gum cyst, some time bothersome, it was drained before but no plan for surgery.  Sees cardiology, no chest pain or dyspnea on routine activities  Looking for a new PCP, I give him [facility] contact info He is overall doing well, review of systems otherwise negative.   MEDICAL HISTORY:  Past Medical History:  Diagnosis Date  . Adenocarcinoma of descending colon  pT3, pN1a, pMX s/p colectomy/ostomy 05/18/2016 05/26/2016  . AICD (automatic cardioverter/defibrillator) present    09/07/13 Dr. Lovena Le post MI with EF 30% RV lead      Medtronic 6947 (serial number [id number]) Generator   Medtronic  Evera XT VR (serial Number [id number])   . Anemia   . Anemia due to blood loss 05/03/2016  . Anxiety   . Anxiety state 01/17/2015  . Automatic implantable cardioverter-defibrillator in situ    MDT Aug 2015 Dr. Lovena Le  .  Benign neoplasm of cecum   . CAD (coronary artery disease), native coronary artery 07/03/2013   Cath 06/20/13  Normal left main, occluded LAD, occluded RCA, 50% circ EF 15%  3.0 x28 and 3.0 x 8 mm Xience stent Dr. Tamala Julian  To LAD   . Cancer ([place])   . Cardiomyopathy, ischemic   . Chronic anticoagulation 06/12/2017  . Chronic kidney disease   . Chronic systolic CHF (congestive heart failure) (HCC)    ECHO 08/05/13  EF 30%  Anterior akinesis and inferior hypokinesis   . COPD (chronic obstructive pulmonary disease) ([place])   . Essential hypertension 05/16/2016  . Financial difficulties 10/01/2013  . History of colon cancer   . Hyperlipidemia   . Intra-abdominal abscess 06/12/2017  . Iron deficiency anemia due to chronic blood loss 07/02/2016  . Mass of colon 04/2016  . Myocardial infarction ([place]) 04/29/2013  . Old anterior myocardial infarction 06/20/2013   per pt 12/22/2014 had an MI  . Partial bowel obstruction ([place]) 05/16/2016  . S/P colostomy takedown 05/20/2017  . Thrombocytopenia ([place]) 12/27/2014   Chronic    . Tobacco use 07/03/2013    SURGICAL HISTORY: Past Surgical History:  Procedure Laterality Date  . CARDIAC CATHETERIZATION  05/2013   . CARDIAC CATHETERIZATION N/A 12/28/2014   Procedure: Left Heart Cath and Coronary Angiography;  Surgeon: [name], MD;  Location: [place] [facility];  Service: Cardiovascular;  Laterality: N/A;  . CARDIAC CATHETERIZATION N/A 12/28/2014   Procedure: Coronary Stent Intervention;  Surgeon: [name], MD;  Location: [place] [facility];  Service: Cardiovascular;  Laterality: N/A;  . COLECTOMY WITH COLOSTOMY CREATION/HARTMANN PROCEDURE Left 05/18/2016   Procedure: COLECTOMY WITH OSTOMY CREATION/HARTMANN PROCEDURE;  Surgeon: [name], MD;  Location: [place];  Service: General;  Laterality: Left;  . COLONOSCOPY    . COLONOSCOPY WITH PROPOFOL N/A 04/01/2017   Procedure: COLONOSCOPY WITH PROPOFOL;  Surgeon: [name], MD;  Location: [facility];   Service: Gastroenterology;  Laterality: N/A;  . COLOSTOMY TAKEDOWN N/A 05/20/2017   Procedure: COLOSTOMY TAKEDOWN;  Surgeon: [name], MD;  Location: [place];  Service: General;  Laterality: N/A;  . CORONARY ANGIOPLASTY  05/2013   . FLEXIBLE SIGMOIDOSCOPY N/A 05/16/2016   Procedure: FLEXIBLE SIGMOIDOSCOPY;  Surgeon: [name], MD;  Location: [place];  Service: Gastroenterology;  Laterality: N/A;  . HAND SURGERY Right    cyst removal  . ICD placement  09/07/2013   . IMPLANTABLE CARDIOVERTER DEFIBRILLATOR IMPLANT N/A 09/07/2013   Procedure: IMPLANTABLE CARDIOVERTER DEFIBRILLATOR IMPLANT;  Surgeon: [name], MD;  Location: [facility] CATH LAB;  Service: Cardiovascular;  Laterality: N/A;  . INTRA-AORTIC BALLOON PUMP INSERTION  06/20/2013   Procedure: INTRA-AORTIC BALLOON PUMP INSERTION;  Surgeon: Sinclair Grooms, MD;  Location: [facility] CATH LAB;  Service: Cardiovascular;;  . LEFT HEART CATHETERIZATION WITH CORONARY ANGIOGRAM N/A 06/20/2013   Procedure: LEFT HEART CATHETERIZATION WITH CORONARY ANGIOGRAM;  Surgeon: Sinclair Grooms, MD;  Location: [facility] CATH LAB;  Service: Cardiovascular;  Laterality: N/A;  . PERCUTANEOUS CORONARY STENT INTERVENTION (PCI-S)  06/20/2013   Procedure: PERCUTANEOUS CORONARY STENT INTERVENTION (PCI-S);  Surgeon: Sinclair Grooms, MD;  Location: [facility] CATH LAB;  Service: Cardiovascular;;    I have reviewed the social history and family history with the patient and they are unchanged from previous note.  ALLERGIES:  is allergic to capecitabine, celebrex [celecoxib], and contrast media [iodinated diagnostic agents].  MEDICATIONS:  Current Outpatient Medications  Medication Sig Dispense Refill  . aspirin 81 MG chewable tablet Chew 1 tablet (81 mg total) by mouth daily. 30 tablet 5  . atorvastatin (LIPITOR) 80 MG tablet TAKE 1 TABLET BY MOUTH ONCE DAILY AT  6PM 90 tablet 0  . carvedilol (COREG) 3.125 MG tablet Take 1 tablet (3.125 mg total) by mouth 2 (two) times daily  with a meal. 180 tablet 3  . clopidogrel (PLAVIX) 75 MG tablet Take 1 tablet (75 mg total) by mouth daily. 90 tablet 0  . digoxin (LANOXIN) 0.125 MG tablet Take 1 tablet (0.125 mg total) by mouth daily. 90 tablet 3  . lisinopril (ZESTRIL) 2.5 MG tablet Take 1 tablet (2.5 mg total) by mouth daily. 90 tablet 3  . Multiple Vitamin (MULTIVITAMIN WITH MINERALS) TABS tablet Take 1 tablet by mouth daily. 30 tablet 0  . nitroGLYCERIN (NITROSTAT) 0.4 MG SL tablet Place 1 tablet (0.4 mg total) under the tongue every 5 (five) minutes as needed for chest pain. 25 tablet 6  . PEG-KCl-NaCl-NaSulf-Na Asc-C (PLENVU) 140 g SOLR Take 1 kit by mouth as directed. 1 each 0  . saccharomyces boulardii (FLORASTOR) 250 MG capsule Take 1 capsule (250 mg total) by mouth 2 (two) times daily. 30 capsule 0   No current facility-administered medications for this visit.    PHYSICAL EXAMINATION: ECOG PERFORMANCE STATUS: 0 - Asymptomatic  Vitals:   10/15/19 1004  BP: 110/73  Pulse: 84  Resp: 18  Temp: 97.8 F (36.6 C)  SpO2: 98%   Filed Weights  10/15/19 1004  Weight: 176 lb 4.8 oz (80 kg)    GENERAL:alert, no distress and comfortable SKIN: skin color, texture, turgor are normal, no rashes or significant lesions EYES: normal, Conjunctiva are pink and non-injected, sclera clear NECK: supple, thyroid normal size, non-tender, without nodularity LYMPH:  no palpable lymphadenopathy in the cervical, axillary  LUNGS: clear to auscultation and percussion with normal breathing effort HEART: regular rate & rhythm and no murmurs and no lower extremity edema ABDOMEN:abdomen soft, non-tender and normal bowel sounds Musculoskeletal:no cyanosis of digits and no clubbing  NEURO: alert & oriented x 3 with fluent speech, no focal motor/sensory deficits  LABORATORY DATA:  I have reviewed the data as listed CBC Latest Ref Rng & Units 10/13/2019 07/09/2019 10/13/2018  WBC 4.0 - 10.5 K/uL 6.5 6.8 6.1  Hemoglobin 13.0 - 17.0 g/dL  13.4 14.7 13.8  Hematocrit 39 - 52 % 39.9 44.8 40.7  Platelets 150 - 400 K/uL 118(L) 129(L) 105(L)     CMP Latest Ref Rng & Units 10/13/2019 07/09/2019 10/13/2018  Glucose 70 - 99 mg/dL 136(H) 103(H) 108(H)  BUN 6 - 20 mg/dL 18 26(H) 22(H)  Creatinine 0.61 - 1.24 mg/dL 1.26(H) 1.42(H) 1.28(H)  Sodium 135 - 145 mmol/L 138 139 139  Potassium 3.5 - 5.1 mmol/L 4.2 4.8 4.8  Chloride 98 - 111 mmol/L 104 104 105  CO2 22 - 32 mmol/L _0 Calcium 8.9 - 10.3 mg/dL 9.5 9.7 9.7  Total Protein 6.5 - 8.1 g/dL 6.6 7.6 6.9  Total Bilirubin 0.3 - 1.2 mg/dL 0.4 0.4 0.4  Alkaline Phos 38 - 126 U/L 58 64 58  AST 15 - 41 U/L _1 ALT 0 - 44 U/L _2 RADIOGRAPHIC STUDIES: I have personally reviewed the radiological images as listed and agreed with the findings in the report. No results found.   ASSESSMENT & PLAN:  Christopher [name] is a 59 y.o. male with    1. Invasive Colorectal Adenocarcinoma of the Left Colon, pT3N1aM0, Stage IIIB, MSI-stable -Diagnosed in 04/2016. Treated with left hemicolectomy andadjuvantchemo. He is currently on surveillance. -Per pt he previously had a bad reaction toCT ivcontrast.Will avoid in the future. -His last colonoscopy was in 03/2017 and had 1 small benign polyp removed. I recommend he repeat in 2022. He is agreeable.  -He is clinically doing very well, asymptomatic surveillance CT scanning was negative for recurrence in June 2021. -Lab reviewed, tumor marker CEA has come down to normal again, CBC and CMP showed thrombocytopenia, stable, and slightly elevated Cr, also stable -He is 3-1/2 years out of initial diagnosis, risk of cancer recurrence has decreased significantly, will continue clinical follow-up -Follow-up in 6 months with lab  2. CAD and systolic CHF with EF 26-83%, COPD  -He previously quit smoking  -s/p 2 stent placements and a pace maker. -On Plavix -Continue tof/u with cardiology    Plan -He is clinically doing very  well, lab reviewed, no concerns -Lab and follow-up with NP in 6 months   No problem-specific Assessment & Plan notes found for this encounter.   No orders of the defined types were placed in this encounter.  All questions were answered. The patient knows to call the clinic with any problems, questions or concerns. No barriers to learning was detected. The total time spent in the appointment was 25 minutes.     [name], MD 10/15/2019   I, Joslyn Devon, am acting as scribe for  [name], MD.   I have reviewed the above documentation for accuracy and completeness, and I agree with the above.

## 2019-10-13 ENCOUNTER — Other Ambulatory Visit: Payer: Self-pay

## 2019-10-13 ENCOUNTER — Inpatient Hospital Stay: Payer: Medicare Other | Attending: Hematology

## 2019-10-13 DIAGNOSIS — J449 Chronic obstructive pulmonary disease, unspecified: Secondary | ICD-10-CM | POA: Diagnosis not present

## 2019-10-13 DIAGNOSIS — D500 Iron deficiency anemia secondary to blood loss (chronic): Secondary | ICD-10-CM

## 2019-10-13 DIAGNOSIS — I252 Old myocardial infarction: Secondary | ICD-10-CM | POA: Diagnosis not present

## 2019-10-13 DIAGNOSIS — Z79899 Other long term (current) drug therapy: Secondary | ICD-10-CM | POA: Diagnosis not present

## 2019-10-13 DIAGNOSIS — M47816 Spondylosis without myelopathy or radiculopathy, lumbar region: Secondary | ICD-10-CM | POA: Insufficient documentation

## 2019-10-13 DIAGNOSIS — I5022 Chronic systolic (congestive) heart failure: Secondary | ICD-10-CM | POA: Insufficient documentation

## 2019-10-13 DIAGNOSIS — C186 Malignant neoplasm of descending colon: Secondary | ICD-10-CM | POA: Diagnosis not present

## 2019-10-13 DIAGNOSIS — D696 Thrombocytopenia, unspecified: Secondary | ICD-10-CM | POA: Diagnosis not present

## 2019-10-13 DIAGNOSIS — I700 Atherosclerosis of aorta: Secondary | ICD-10-CM | POA: Insufficient documentation

## 2019-10-13 DIAGNOSIS — I255 Ischemic cardiomyopathy: Secondary | ICD-10-CM | POA: Diagnosis not present

## 2019-10-13 DIAGNOSIS — C189 Malignant neoplasm of colon, unspecified: Secondary | ICD-10-CM

## 2019-10-13 DIAGNOSIS — J439 Emphysema, unspecified: Secondary | ICD-10-CM | POA: Diagnosis not present

## 2019-10-13 LAB — COMPREHENSIVE METABOLIC PANEL
ALT: 22 U/L (ref 0–44)
AST: 19 U/L (ref 15–41)
Albumin: 3.7 g/dL (ref 3.5–5.0)
Alkaline Phosphatase: 58 U/L (ref 38–126)
Anion gap: 5 (ref 5–15)
BUN: 18 mg/dL (ref 6–20)
CO2: 29 mmol/L (ref 22–32)
Calcium: 9.5 mg/dL (ref 8.9–10.3)
Chloride: 104 mmol/L (ref 98–111)
Creatinine, Ser: 1.26 mg/dL — ABNORMAL HIGH (ref 0.61–1.24)
GFR calc Af Amer: >60 mL/min (ref >60)
GFR calc non Af Amer: >60 mL/min (ref >60)
Glucose, Bld: 136 mg/dL — ABNORMAL HIGH (ref 70–99)
Potassium: 4.2 mmol/L (ref 3.5–5.1)
Sodium: 138 mmol/L (ref 135–145)
Total Bilirubin: 0.4 mg/dL (ref 0.3–1.2)
Total Protein: 6.6 g/dL (ref 6.5–8.1)

## 2019-10-13 LAB — CBC WITH DIFFERENTIAL/PLATELET
Abs Immature Granulocytes: 0.02 10*3/uL (ref 0.00–0.07)
Basophils Absolute: 0 10*3/uL (ref 0.0–0.1)
Basophils Relative: 1 %
Eosinophils Absolute: 0.2 10*3/uL (ref 0.0–0.5)
Eosinophils Relative: 3 %
HCT: 39.9 % (ref 39.0–52.0)
Hemoglobin: 13.4 g/dL (ref 13.0–17.0)
Immature Granulocytes: 0 %
Lymphocytes Relative: 30 %
Lymphs Abs: 2 10*3/uL (ref 0.7–4.0)
MCH: 30.4 pg (ref 26.0–34.0)
MCHC: 33.6 g/dL (ref 30.0–36.0)
MCV: 90.5 fL (ref 80.0–100.0)
Monocytes Absolute: 0.5 10*3/uL (ref 0.1–1.0)
Monocytes Relative: 8 %
Neutro Abs: 3.8 10*3/uL (ref 1.7–7.7)
Neutrophils Relative %: 58 %
Platelets: 118 10*3/uL — ABNORMAL LOW (ref 150–400)
RBC: 4.41 MIL/uL (ref 4.22–5.81)
RDW: 14.2 % (ref 11.5–15.5)
WBC: 6.5 10*3/uL (ref 4.0–10.5)
nRBC: 0 % (ref 0.0–0.2)

## 2019-10-13 LAB — IRON AND TIBC
Iron: 61 ug/dL (ref 42–163)
Saturation Ratios: 24 % (ref 20–55)
TIBC: 253 ug/dL (ref 202–409)
UIBC: 192 ug/dL (ref 117–376)

## 2019-10-13 LAB — CEA (IN HOUSE-CHCC): CEA (CHCC-In House): 3.91 ng/mL (ref 0.00–5.00)

## 2019-10-13 LAB — FERRITIN: Ferritin: 63 ng/mL (ref 24–336)

## 2019-10-15 ENCOUNTER — Other Ambulatory Visit: Payer: Self-pay

## 2019-10-15 ENCOUNTER — Encounter: Payer: Self-pay | Admitting: Hematology

## 2019-10-15 ENCOUNTER — Telehealth: Payer: Self-pay | Admitting: Hematology

## 2019-10-15 ENCOUNTER — Inpatient Hospital Stay (HOSPITAL_BASED_OUTPATIENT_CLINIC_OR_DEPARTMENT_OTHER): Payer: Medicare Other | Admitting: Hematology

## 2019-10-15 VITALS — BP 110/73 | HR 84 | Temp 97.8°F | Resp 18 | Ht 67.5 in | Wt 176.3 lb

## 2019-10-15 DIAGNOSIS — I2510 Atherosclerotic heart disease of native coronary artery without angina pectoris: Secondary | ICD-10-CM

## 2019-10-15 DIAGNOSIS — M47816 Spondylosis without myelopathy or radiculopathy, lumbar region: Secondary | ICD-10-CM | POA: Diagnosis not present

## 2019-10-15 DIAGNOSIS — I700 Atherosclerosis of aorta: Secondary | ICD-10-CM | POA: Diagnosis not present

## 2019-10-15 DIAGNOSIS — I5022 Chronic systolic (congestive) heart failure: Secondary | ICD-10-CM | POA: Diagnosis not present

## 2019-10-15 DIAGNOSIS — I255 Ischemic cardiomyopathy: Secondary | ICD-10-CM

## 2019-10-15 DIAGNOSIS — C186 Malignant neoplasm of descending colon: Secondary | ICD-10-CM

## 2019-10-15 DIAGNOSIS — I252 Old myocardial infarction: Secondary | ICD-10-CM | POA: Diagnosis not present

## 2019-10-15 DIAGNOSIS — J439 Emphysema, unspecified: Secondary | ICD-10-CM | POA: Diagnosis not present

## 2019-10-15 NOTE — Telephone Encounter (Signed)
Scheduled appointment per 9/16 los. Patient is aware of appointments and I gave patient updated calendar.

## 2019-10-16 ENCOUNTER — Encounter: Payer: Self-pay | Admitting: Hematology

## 2019-10-20 ENCOUNTER — Inpatient Hospital Stay: Payer: Medicare Other

## 2019-10-20 ENCOUNTER — Other Ambulatory Visit: Payer: Self-pay

## 2019-10-20 DIAGNOSIS — Z23 Encounter for immunization: Secondary | ICD-10-CM | POA: Diagnosis not present

## 2019-10-20 MED ORDER — INFLUENZA VAC SPLIT QUAD 0.5 ML IM SUSY: 0.5000 mL | PREFILLED_SYRINGE | Freq: Once | INTRAMUSCULAR | Status: AC

## 2019-11-10 DIAGNOSIS — Z23 Encounter for immunization: Secondary | ICD-10-CM | POA: Diagnosis not present

## 2019-11-24 ENCOUNTER — Other Ambulatory Visit: Payer: Self-pay

## 2019-11-24 ENCOUNTER — Ambulatory Visit (INDEPENDENT_AMBULATORY_CARE_PROVIDER_SITE_OTHER): Payer: Medicare Other | Admitting: Internal Medicine

## 2019-11-24 ENCOUNTER — Encounter: Payer: Self-pay | Admitting: Internal Medicine

## 2019-11-24 VITALS — BP 112/78 | HR 84 | Ht 67.5 in | Wt 177.0 lb

## 2019-11-24 DIAGNOSIS — Z95810 Presence of automatic (implantable) cardiac defibrillator: Secondary | ICD-10-CM | POA: Diagnosis not present

## 2019-11-24 DIAGNOSIS — I2510 Atherosclerotic heart disease of native coronary artery without angina pectoris: Secondary | ICD-10-CM | POA: Diagnosis not present

## 2019-11-24 DIAGNOSIS — I255 Ischemic cardiomyopathy: Secondary | ICD-10-CM | POA: Diagnosis not present

## 2019-11-24 DIAGNOSIS — I5022 Chronic systolic (congestive) heart failure: Secondary | ICD-10-CM

## 2019-11-24 NOTE — Progress Notes (Signed)
HPI Christopher [name] returns today for followup. He is a pleasant 58 yo man with a h/o chronic systolic heart failure, and ICM, CAD, s/p MI, colon Ca s/p resection. In the interim, he notes that he has done well with no chest pain or sob. He will occaisionally get exertional chest tightness which always gets better with rest. Allergies  Allergen Reactions   Capecitabine Other (See Comments)    Server dehydration and diarrhea       Celebrex [Celecoxib] Other (See Comments)    Caused his back to burn   Contrast Media [Iodinated Diagnostic Agents] Other (See Comments)    Patient feels weakness     Current Outpatient Medications  Medication Sig Dispense Refill   aspirin 81 MG chewable tablet Chew 1 tablet (81 mg total) by mouth daily. 30 tablet 5   atorvastatin (LIPITOR) 80 MG tablet TAKE 1 TABLET BY MOUTH ONCE DAILY AT  6PM 90 tablet 0   carvedilol (COREG) 3.125 MG tablet Take 1 tablet (3.125 mg total) by mouth 2 (two) times daily with a meal. 180 tablet 3   clopidogrel (PLAVIX) 75 MG tablet Take 1 tablet (75 mg total) by mouth daily. 90 tablet 0   digoxin (LANOXIN) 0.125 MG tablet Take 1 tablet (0.125 mg total) by mouth daily. 90 tablet 3   lisinopril (ZESTRIL) 2.5 MG tablet Take 1 tablet (2.5 mg total) by mouth daily. 90 tablet 3   Multiple Vitamin (MULTIVITAMIN WITH MINERALS) TABS tablet Take 1 tablet by mouth daily. 30 tablet 0   nitroGLYCERIN (NITROSTAT) 0.4 MG SL tablet Place 1 tablet (0.4 mg total) under the tongue every 5 (five) minutes as needed for chest pain. 25 tablet 6   PEG-KCl-NaCl-NaSulf-Na Asc-C (PLENVU) 140 g SOLR Take 1 kit by mouth as directed. 1 each 0   saccharomyces boulardii (FLORASTOR) 250 MG capsule Take 1 capsule (250 mg total) by mouth 2 (two) times daily. 30 capsule 0   No current facility-administered medications for this visit.   Facility-Administered Medications Ordered in Other Visits  Medication Dose Route Frequency Provider Last Rate  Last Admin   influenza vac split quadrivalent PF (FLUARIX) injection 0.5 mL  0.5 mL Intramuscular Once [name], MD         Past Medical History:  Diagnosis Date   Adenocarcinoma of descending colon  pT3, pN1a, pMX s/p colectomy/ostomy 05/18/2016 05/26/2016   AICD (automatic cardioverter/defibrillator) present    09/07/13 Dr. Lovena Le post MI with EF 30% RV lead      Medtronic 6947 (serial number [id number]) Generator   Medtronic  Evera XT VR (serial Number [id number])    Anemia    Anemia due to blood loss 05/03/2016   Anxiety    Anxiety state 01/17/2015   Automatic implantable cardioverter-defibrillator in situ    MDT Aug 2015 Dr. Lovena Le   Benign neoplasm of cecum    CAD (coronary artery disease), native coronary artery 07/03/2013   Cath 06/20/13  Normal left main, occluded LAD, occluded RCA, 50% circ EF 15%  3.0 x28 and 3.0 x 8 mm Xience stent Dr. Tamala Julian  To LAD    Cancer [facility])    Cardiomyopathy, ischemic    Chronic anticoagulation 06/12/2017   Chronic kidney disease    Chronic systolic CHF (congestive heart failure) ([place])    ECHO 08/05/13  EF 30%  Anterior akinesis and inferior hypokinesis    COPD (chronic obstructive pulmonary disease) ([place])    Essential hypertension 05/16/2016   Financial difficulties  10/01/2013   History of colon cancer    Hyperlipidemia    Intra-abdominal abscess 06/12/2017   Iron deficiency anemia due to chronic blood loss 07/02/2016   Mass of colon 04/2016   Myocardial infarction [facility]) 04/29/2013   Old anterior myocardial infarction 06/20/2013   per pt 12/22/2014 had an MI   Partial bowel obstruction ([place]) 05/16/2016   S/P colostomy takedown 05/20/2017   Thrombocytopenia ([place]) 12/27/2014   Chronic     Tobacco use 07/03/2013    ROS:   All systems reviewed and negative except as noted in the HPI.   Past Surgical History:  Procedure Laterality Date   CARDIAC CATHETERIZATION  05/2013    CARDIAC CATHETERIZATION N/A 12/28/2014   Procedure:  Left Heart Cath and Coronary Angiography;  Surgeon: [name], MD;  Location: [place] CV LAB;  Service: Cardiovascular;  Laterality: N/A;   CARDIAC CATHETERIZATION N/A 12/28/2014   Procedure: Coronary Stent Intervention;  Surgeon: [name], MD;  Location: [place] [facility] LAB;  Service: Cardiovascular;  Laterality: N/A;   COLECTOMY WITH COLOSTOMY CREATION/HARTMANN PROCEDURE Left 05/18/2016   Procedure: COLECTOMY WITH OSTOMY CREATION/HARTMANN PROCEDURE;  Surgeon: [name], MD;  Location: [place];  Service: General;  Laterality: Left;   COLONOSCOPY     COLONOSCOPY WITH PROPOFOL N/A 04/01/2017   Procedure: COLONOSCOPY WITH PROPOFOL;  Surgeon: [name], MD;  Location: [facility];  Service: Gastroenterology;  Laterality: N/A;   COLOSTOMY TAKEDOWN N/A 05/20/2017   Procedure: COLOSTOMY TAKEDOWN;  Surgeon: [name], MD;  Location: [place];  Service: General;  Laterality: N/A;   CORONARY ANGIOPLASTY  05/2013    FLEXIBLE SIGMOIDOSCOPY N/A 05/16/2016   Procedure: FLEXIBLE SIGMOIDOSCOPY;  Surgeon: [name], MD;  Location: [place];  Service: Gastroenterology;  Laterality: N/A;   HAND SURGERY Right    cyst removal   ICD placement  09/07/2013    IMPLANTABLE CARDIOVERTER DEFIBRILLATOR IMPLANT N/A 09/07/2013   Procedure: IMPLANTABLE CARDIOVERTER DEFIBRILLATOR IMPLANT;  Surgeon: [name], MD;  Location: [facility] CATH LAB;  Service: Cardiovascular;  Laterality: N/A;   INTRA-AORTIC BALLOON PUMP INSERTION  06/20/2013   Procedure: INTRA-AORTIC BALLOON PUMP INSERTION;  Surgeon: Sinclair Grooms, MD;  Location: [facility] CATH LAB;  Service: Cardiovascular;;   LEFT HEART CATHETERIZATION WITH CORONARY ANGIOGRAM N/A 06/20/2013   Procedure: LEFT HEART CATHETERIZATION WITH CORONARY ANGIOGRAM;  Surgeon: Sinclair Grooms, MD;  Location: [facility] CATH LAB;  Service: Cardiovascular;  Laterality: N/A;   PERCUTANEOUS CORONARY STENT INTERVENTION (PCI-S)  06/20/2013   Procedure: PERCUTANEOUS  CORONARY STENT INTERVENTION (PCI-S);  Surgeon: Sinclair Grooms, MD;  Location: [facility] CATH LAB;  Service: Cardiovascular;;     Family History  Problem Relation Age of Onset   Emphysema Father    Lung disease Father    Heart disease Mother    Dementia Mother    Diabetes Mother    CVA Mother    Alzheimer's disease Mother    Pancreatic cancer Maternal Grandmother    Lung cancer Maternal Grandmother    Cancer Neg Hx      Social History   Socioeconomic History   Marital status: Single    Spouse name: Not on file   Number of children: 0   Years of education: 12   Highest education level: Not on file  Occupational History   Occupation: Disabled  Tobacco Use   Smoking status: Former Smoker    Packs/day: 2.00    Years: 40.00    Pack  years: 80.00    Types: Cigarettes    Quit date: 12/22/2014    Years since quitting: 4.9   Smokeless tobacco: Never Used  Vaping Use   Vaping Use: Never used  Substance and Sexual Activity   Alcohol use: No    Comment: quit in 08/2013, he used to drink alcohol moderate (1 pine liquor one week)  for 30 years    Drug use: No   Sexual activity: Not Currently    Birth control/protection: Condom    Comment: men   Other Topics Concern   Not on file  Social History Narrative   Lives alone.    In a house trying to sell his home. Exercise: No.   Social Determinants of Health   Financial Resource Strain:    Difficulty of Paying Living Expenses: Not on file  Food Insecurity:    Worried About [place] in the Last Year: Not on file   Ran Out of Food in the Last Year: Not on file  Transportation Needs:    Lack of Transportation (Medical): Not on file   Lack of Transportation (Non-Medical): Not on file  Physical Activity:    Days of Exercise per Week: Not on file   Minutes of Exercise per Session: Not on file  Stress:    Feeling of Stress : Not on file  Social Connections:    Frequency of Communication  with Friends and Family: Not on file   Frequency of Social Gatherings with Friends and Family: Not on file   Attends Religious Services: Not on file   Active Member of Clubs or Organizations: Not on file   Attends Archivist Meetings: Not on file   Marital Status: Not on file  Intimate Partner Violence:    Fear of Current or Ex-Partner: Not on file   Emotionally Abused: Not on file   Physically Abused: Not on file   Sexually Abused: Not on file     BP 112/78    Pulse 84    Ht 5' 7.5" (1.715 m)    Wt 177 lb (80.3 kg)    SpO2 99%    BMI 27.31 kg/m   Physical Exam:  Well appearing NAD HEENT: Unremarkable Neck:  No JVD, no thyromegally Lymphatics:  No adenopathy Back:  No CVA tenderness Lungs:  Clear HEART:  Regular rate rhythm, no murmurs, no rubs, no clicks Abd:  soft, positive bowel sounds, no organomegally, no rebound, no guarding Ext:  2 plus pulses, no edema, no cyanosis, no clubbing Skin:  No rashes no nodules Neuro:  CN II through XII intact, motor grossly intact  EKG   - NSR with old anterior MI   DEVICE  Normal device function.  See PaceArt for details.   Assess/Plan: 1. ICM - he is doing well with minimal symptoms. No change in meds. He is encouraged to remain active. 2. Chronic systolic heart failure - his symptoms are class 2. He will continue his current meds. 3. ICD - his medtronic single chamber ICD is working normally. 4. Dyslipidemia - he will  Continue his high dose statin. No muscle complaints.  Carleene Overlie Mitesh Rosendahl,MD

## 2019-11-24 NOTE — Patient Instructions (Signed)
Medication Instructions:  Your physician recommends that you continue on your current medications as directed. Please refer to the Current Medication list given to you today.  Labwork: None ordered.  Testing/Procedures: None ordered.  Follow-Up: Your physician wants you to follow-up in: one year with Dr. Lovena Le.   You will receive a reminder letter in the mail two months in advance. If you don't receive a letter, please call our office to schedule the follow-up appointment.  Remote monitoring is used to monitor your ICD from home. This monitoring reduces the number of office visits required to check your device to one time per year. It allows [place] to keep an eye on the functioning of your device to ensure it is working properly. You are scheduled for a device check from home on 12/28/2019. You may send your transmission at any time that day. If you have a wireless device, the transmission will be sent automatically. After your physician reviews your transmission, you will receive a postcard with your next transmission date.  Any Other Special Instructions Will Be Listed Below (If Applicable).  If you need a refill on your cardiac medications before your next appointment, please call your pharmacy.

## 2019-11-30 ENCOUNTER — Other Ambulatory Visit: Payer: Self-pay

## 2019-11-30 ENCOUNTER — Ambulatory Visit (INDEPENDENT_AMBULATORY_CARE_PROVIDER_SITE_OTHER): Payer: Medicare Other | Admitting: Cardiovascular Disease

## 2019-11-30 ENCOUNTER — Encounter: Payer: Self-pay | Admitting: Cardiovascular Disease

## 2019-11-30 VITALS — BP 110/80 | HR 85 | Ht 67.5 in | Wt 176.0 lb

## 2019-11-30 DIAGNOSIS — I255 Ischemic cardiomyopathy: Secondary | ICD-10-CM | POA: Diagnosis not present

## 2019-11-30 DIAGNOSIS — E785 Hyperlipidemia, unspecified: Secondary | ICD-10-CM | POA: Diagnosis not present

## 2019-11-30 DIAGNOSIS — I10 Essential (primary) hypertension: Secondary | ICD-10-CM

## 2019-11-30 DIAGNOSIS — I2510 Atherosclerotic heart disease of native coronary artery without angina pectoris: Secondary | ICD-10-CM | POA: Diagnosis not present

## 2019-11-30 LAB — LIPID PANEL
Chol/HDL Ratio: 3.3 ratio (ref 0.0–5.0)
Cholesterol, Total: 136 mg/dL (ref 100–199)
HDL: 41 mg/dL (ref >39)
LDL Chol Calc (NIH): 80 mg/dL (ref 0–99)
Triglycerides: 75 mg/dL (ref 0–149)
VLDL Cholesterol Cal: 15 mg/dL (ref 5–40)

## 2019-11-30 LAB — DIGOXIN LEVEL: Digoxin, Serum: 0.8 ng/mL (ref 0.5–0.9)

## 2019-11-30 LAB — HEPATIC FUNCTION PANEL
ALT: 24 IU/L (ref 0–44)
AST: 19 IU/L (ref 0–40)
Albumin: 4.6 g/dL (ref 3.8–4.9)
Alkaline Phosphatase: 65 IU/L (ref 44–121)
Bilirubin Total: 0.4 mg/dL (ref 0.0–1.2)
Bilirubin, Direct: 0.13 mg/dL (ref 0.00–0.40)
Total Protein: 6.6 g/dL (ref 6.0–8.5)

## 2019-11-30 MED ORDER — CLOPIDOGREL BISULFATE 75 MG PO TABS: 75.0000 mg | ORAL_TABLET | Freq: Every day | ORAL | 3 refills | Status: DC

## 2019-11-30 NOTE — Progress Notes (Signed)
Chief Complaint  Patient presents with  . Follow-up    CAD   History of Present Illness: 59 yo male with history of anxiety, CAD with prior stenting of the LAD and obtuse marginal, ischemic cardiomyopathy s/p ICD, chronic kidney disease, COPD, chronic systolic CHF, HTN, hyperlipidemia, former tobacco abuse, iron deficiency anemia, colon cancer s/p colectomy here today for follow up. He has been followed in the past by Dr. Wynonia Lawman. Given Dr. Thurman Coyer recent absence due to illness, Mr. Lakeman was seen in our [facility] Cardiology office by Dr. Bettina Gavia 02/10/18 to establish care there. He has a complex past medical history. He has undergone a colectomy for colon cancer. He was admitted to [facility] May 2015 with a late presenting anterior STEMI. He was found to have chronic total occlusion of the mid RCA and a totally occluded proximal to mid LAD. There was moderate disease in the Circumflex artery. LVEF was 15-20%. Overlapping drug eluting stents were placed in the mid LAD. A balloon pump was placed in the cath lab. ICD implanted 09/07/13. His ICD has been followed in our EP clinic by Dr. Lovena Le. Cardiac cath November 2016 with widely patent stent in the LAD. Chronically occluded RCA with filling from left to right collaterals. There was a severe stenosis in the obtuse marginal branch treated with a drug eluting stent. Echo April 2018 with LVEF=25-30%. No significant valve disease. When he was seen by Dr. Bettina Gavia on 02/10/18 he complained of dyspnea with exertion and isolated episodes of chest pain. He was felt to be stable. His Ace-inh was changed to [facility]. He was angry when he found out the cost of Entresto and refused to go back to that office to be seen. I saw him in the office here at [facility] for the first time in February 2020 for pre-operative clearance prior to a colonoscopy and he was feeling well.   He is here today for follow up. The patient denies any dyspnea, palpitations, lower extremity edema,  orthopnea, PND, dizziness, near syncope or syncope. Stable chest pain with moderate exertion.     Primary Care Physician: Patient, No Pcp Per  Past Medical History:  Diagnosis Date  . Adenocarcinoma of descending colon  pT3, pN1a, pMX s/p colectomy/ostomy 05/18/2016 05/26/2016  . AICD (automatic cardioverter/defibrillator) present    09/07/13 Dr. Lovena Le post MI with EF 30% RV lead      Medtronic 6947 (serial number [id number]) Generator   Medtronic  Evera XT VR (serial Number [id number])   . Anemia   . Anemia due to blood loss 05/03/2016  . Anxiety   . Anxiety state 01/17/2015  . Automatic implantable cardioverter-defibrillator in situ    MDT Aug 2015 Dr. Lovena Le  . Benign neoplasm of cecum   . CAD (coronary artery disease), native coronary artery 07/03/2013   Cath 06/20/13  Normal left main, occluded LAD, occluded RCA, 50% circ EF 15%  3.0 x28 and 3.0 x 8 mm Xience stent Dr. Tamala Julian  To LAD   . Cancer ([place])   . Cardiomyopathy, ischemic   . Chronic anticoagulation 06/12/2017  . Chronic kidney disease   . Chronic systolic CHF (congestive heart failure) (HCC)    ECHO 08/05/13  EF 30%  Anterior akinesis and inferior hypokinesis   . COPD (chronic obstructive pulmonary disease) ([place])   . Essential hypertension 05/16/2016  . Financial difficulties 10/01/2013  . History of colon cancer   . Hyperlipidemia   . Intra-abdominal abscess 06/12/2017  . Iron deficiency anemia due to  chronic blood loss 07/02/2016  . Mass of colon 04/2016  . Myocardial infarction ([place]) 04/29/2013  . Old anterior myocardial infarction 06/20/2013   per pt 12/22/2014 had an MI  . Partial bowel obstruction ([place]) 05/16/2016  . S/P colostomy takedown 05/20/2017  . Thrombocytopenia ([place]) 12/27/2014   Chronic    . Tobacco use 07/03/2013    Past Surgical History:  Procedure Laterality Date  . CARDIAC CATHETERIZATION  05/2013   . CARDIAC CATHETERIZATION N/A 12/28/2014   Procedure: Left Heart Cath and Coronary Angiography;  Surgeon: [name], MD;  Location: [place] [facility];  Service: Cardiovascular;  Laterality: N/A;  . CARDIAC CATHETERIZATION N/A 12/28/2014   Procedure: Coronary Stent Intervention;  Surgeon: [name], MD;  Location: [place] [facility];  Service: Cardiovascular;  Laterality: N/A;  . COLECTOMY WITH COLOSTOMY CREATION/HARTMANN PROCEDURE Left 05/18/2016   Procedure: COLECTOMY WITH OSTOMY CREATION/HARTMANN PROCEDURE;  Surgeon: [name], MD;  Location: [place];  Service: General;  Laterality: Left;  . COLONOSCOPY    . COLONOSCOPY WITH PROPOFOL N/A 04/01/2017   Procedure: COLONOSCOPY WITH PROPOFOL;  Surgeon: [name], MD;  Location: [facility];  Service: Gastroenterology;  Laterality: N/A;  . COLOSTOMY TAKEDOWN N/A 05/20/2017   Procedure: COLOSTOMY TAKEDOWN;  Surgeon: [name], MD;  Location: [place];  Service: General;  Laterality: N/A;  . CORONARY ANGIOPLASTY  05/2013   . FLEXIBLE SIGMOIDOSCOPY N/A 05/16/2016   Procedure: FLEXIBLE SIGMOIDOSCOPY;  Surgeon: [name], MD;  Location: [place];  Service: Gastroenterology;  Laterality: N/A;  . HAND SURGERY Right    cyst removal  . ICD placement  09/07/2013   . IMPLANTABLE CARDIOVERTER DEFIBRILLATOR IMPLANT N/A 09/07/2013   Procedure: IMPLANTABLE CARDIOVERTER DEFIBRILLATOR IMPLANT;  Surgeon: [name], MD;  Location: [facility] CATH LAB;  Service: Cardiovascular;  Laterality: N/A;  . INTRA-AORTIC BALLOON PUMP INSERTION  06/20/2013   Procedure: INTRA-AORTIC BALLOON PUMP INSERTION;  Surgeon: Sinclair Grooms, MD;  Location: [facility] CATH LAB;  Service: Cardiovascular;;  . LEFT HEART CATHETERIZATION WITH CORONARY ANGIOGRAM N/A 06/20/2013   Procedure: LEFT HEART CATHETERIZATION WITH CORONARY ANGIOGRAM;  Surgeon: Sinclair Grooms, MD;  Location: [facility] CATH LAB;  Service: Cardiovascular;  Laterality: N/A;  . PERCUTANEOUS CORONARY STENT INTERVENTION (PCI-S)  06/20/2013   Procedure: PERCUTANEOUS CORONARY STENT INTERVENTION (PCI-S);  Surgeon: Sinclair Grooms, MD;  Location: [facility] CATH LAB;  Service: Cardiovascular;;    Current Outpatient Medications  Medication Sig Dispense Refill  . aspirin 81 MG chewable tablet Chew 1 tablet (81 mg total) by mouth daily. 30 tablet 5  . atorvastatin (LIPITOR) 80 MG tablet TAKE 1 TABLET BY MOUTH ONCE DAILY AT  6PM 90 tablet 0  . carvedilol (COREG) 3.125 MG tablet Take 1 tablet (3.125 mg total) by mouth 2 (two) times daily with a meal. 180 tablet 3  . clopidogrel (PLAVIX) 75 MG tablet Take 1 tablet (75 mg total) by mouth daily. 90 tablet 3  . digoxin (LANOXIN) 0.125 MG tablet Take 1 tablet (0.125 mg total) by mouth daily. 90 tablet 3  . lisinopril (ZESTRIL) 2.5 MG tablet Take 1 tablet (2.5 mg total) by mouth daily. 90 tablet 3  . Multiple Vitamin (MULTIVITAMIN WITH MINERALS) TABS tablet Take 1 tablet by mouth daily. 30 tablet 0  . nitroGLYCERIN (NITROSTAT) 0.4 MG SL tablet Place 1 tablet (0.4 mg total) under the tongue every 5 (five) minutes as needed for chest pain. 25 tablet 6  . PEG-KCl-NaCl-NaSulf-Na Asc-C (  PLENVU) 140 g SOLR Take 1 kit by mouth as directed. 1 each 0  . saccharomyces boulardii (FLORASTOR) 250 MG capsule Take 1 capsule (250 mg total) by mouth 2 (two) times daily. 30 capsule 0   No current facility-administered medications for this visit.   Facility-Administered Medications Ordered in Other Visits  Medication Dose Route Frequency Provider Last Rate Last Admin  . influenza vac split quadrivalent PF (FLUARIX) injection 0.5 mL  0.5 mL Intramuscular Once [name], MD        Allergies  Allergen Reactions  . Capecitabine Other (See Comments)    Server dehydration and diarrhea      . Celebrex [Celecoxib] Other (See Comments)    Caused his back to burn  . Contrast Media [Iodinated Diagnostic Agents] Other (See Comments)    Patient feels weakness    Social History   Socioeconomic History  . Marital status: Single    Spouse name: Not on file  . Number of children: 0  . Years of  education: 19  . Highest education level: Not on file  Occupational History  . Occupation: Disabled  Tobacco Use  . Smoking status: Former Smoker    Packs/day: 2.00    Years: 40.00    Pack years: 80.00    Types: Cigarettes    Quit date: 12/22/2014    Years since quitting: 4.9  . Smokeless tobacco: Never Used  Vaping Use  . Vaping Use: Never used  Substance and Sexual Activity  . Alcohol use: No    Comment: quit in 08/2013, he used to drink alcohol moderate (1 pine liquor one week)  for 30 years   . Drug use: No  . Sexual activity: Not Currently    Birth control/protection: Condom    Comment: men   Other Topics Concern  . Not on file  Social History Narrative   Lives alone.    In a house trying to sell his home. Exercise: No.   Social Determinants of Health   Financial Resource Strain:   . Difficulty of Paying Living Expenses: Not on file  Food Insecurity:   . Worried About Charity fundraiser in the Last Year: Not on file  . Ran Out of Food in the Last Year: Not on file  Transportation Needs:   . Lack of Transportation (Medical): Not on file  . Lack of Transportation (Non-Medical): Not on file  Physical Activity:   . Days of Exercise per Week: Not on file  . Minutes of Exercise per Session: Not on file  Stress:   . Feeling of Stress : Not on file  Social Connections:   . Frequency of Communication with Friends and Family: Not on file  . Frequency of Social Gatherings with Friends and Family: Not on file  . Attends Religious Services: Not on file  . Active Member of Clubs or Organizations: Not on file  . Attends Archivist Meetings: Not on file  . Marital Status: Not on file  Intimate Partner Violence:   . Fear of Current or Ex-Partner: Not on file  . Emotionally Abused: Not on file  . Physically Abused: Not on file  . Sexually Abused: Not on file    Family History  Problem Relation Age of Onset  . Emphysema Father   . Lung disease Father   . Heart  disease Mother   . Dementia Mother   . Diabetes Mother   . CVA Mother   . Alzheimer's disease Mother   .  Pancreatic cancer Maternal Grandmother   . Lung cancer Maternal Grandmother   . Cancer Neg Hx     Review of Systems:  As stated in the HPI and otherwise negative.   BP 110/80   Pulse 85   Ht 5' 7.5" (1.715 m)   Wt 176 lb (79.8 kg)   SpO2 96%   BMI 27.16 kg/m   Physical Examination:  General: Well developed, well nourished, NAD  HEENT: OP clear, mucus membranes moist  SKIN: warm, dry. No rashes. Neuro: No focal deficits  Musculoskeletal: Muscle strength 5/5 all ext  Psychiatric: Mood and affect normal  Neck: No JVD, no carotid bruits, no thyromegaly, no lymphadenopathy.  Lungs:Clear bilaterally, no wheezes, rhonci, crackles Cardiovascular: Regular rate and rhythm. No murmurs, gallops or rubs. Abdomen:Soft. Bowel sounds present. Non-tender.  Extremities: No lower extremity edema. Pulses are 2 + in the bilateral DP/PT.  Echo April 2018: - Left ventricle: The cavity size was mildly dilated. Wall   thickness was normal. Systolic function was severely reduced. The   estimated ejection fraction was in the range of 25% to 30%.   Akinesis of the anteroseptal, anterior, and apical myocardium.   Doppler parameters are consistent with abnormal left ventricular   relaxation (grade 1 diastolic dysfunction).  EKG:  EKG is not ordered today. The ekg ordered today demonstrates  Recent Labs: 10/13/2019: ALT 22; BUN 18; Creatinine, Ser 1.26; Hemoglobin 13.4; Platelets 118; Potassium 4.2; Sodium 138   Lipid Panel    Component Value Date/Time   CHOL 128 02/10/2018 0900   TRIG 146 02/10/2018 0900   HDL 36 (L) 02/10/2018 0900   CHOLHDL 3.6 02/10/2018 0900   CHOLHDL 3.9 12/23/2014 0331   VLDL 13 12/23/2014 0331   LDLCALC 63 02/10/2018 0900     Wt Readings from Last 3 Encounters:  11/30/19 176 lb (79.8 kg)  11/24/19 177 lb (80.3 kg)  10/15/19 176 lb 4.8 oz (80 kg)     Assessment and Plan:   1. CAD with angina: He has stable chest pain with moderate exertion. Will continue ASA, Plavix, beta blocker and statin.     2. Ischemic cardiomyopathy: LVEF=30% by echo in 2018. No volume overload. ICD in place and followed by Dr. Lovena Le. He has refused to consider Entresto due to cost. Will continue Digoxin, Coreg and Lisinopril. Check Digoxin level today.   3. HTN: BP is controlled. Continue current meds.   4. Hyperlipidemia: Lipids well controlled in January 2020. Continue statin. Lipids and LFTS now.   5. Former tobacco abuse  Current medicines are reviewed at length with the patient today.  The patient does not have concerns regarding medicines.  The following changes have been made:  no change  Follow up with me in 12 month.   Labs/ tests ordered today include:   Orders Placed This Encounter  Procedures  . Hepatic function panel  . Lipid panel  . Digoxin level    Signed, Lauree Chandler, MD 11/30/2019 8:24 AM    [facility] [address], [place], [place]  [address] Phone: [phone]; Fax: [phone]

## 2019-11-30 NOTE — Patient Instructions (Signed)
Medication Instructions:  Your provider recommends that you continue on your current medications as directed. Please refer to the Current Medication list given to you today.   *If you need a refill on your cardiac medications before your next appointment, please call your pharmacy*  Lab Work: TODAY! Lipids, Liver, digoxin If you have labs (blood work) drawn today and your tests are completely normal, you will receive your results only by:  [place] (if you have MyChart) OR  A paper copy in the mail If you have any lab test that is abnormal or we need to change your treatment, we will call you to review the results.  Follow-Up: At [facility], you and your health needs are our priority.  As part of our continuing mission to provide you with exceptional heart care, we have created designated Provider Care Teams.  These Care Teams include your primary Cardiologist (physician) and Advanced Practice Providers (APPs -  Physician Assistants and Nurse Practitioners) who all work together to provide you with the care you need, when you need it. Your next appointment:   12 month(s) The format for your next appointment:   In Person Provider:   You may see Lauree Chandler, MD or one of the following Advanced Practice Providers on your designated Care Team:    Melina Copa, PA-C  Ermalinda Barrios, PA-C

## 2019-12-02 ENCOUNTER — Telehealth: Payer: Self-pay | Admitting: Cardiovascular Disease

## 2019-12-02 NOTE — Telephone Encounter (Signed)
I returned pt's call, left message to call for results.

## 2019-12-02 NOTE — Telephone Encounter (Signed)
Returned call to pt to go over results. Left message to call back.

## 2019-12-02 NOTE — Telephone Encounter (Signed)
Follow up:   Patient returning a call back.  

## 2019-12-02 NOTE — Telephone Encounter (Signed)
Pt returning a call from our office from yesterday about some lab results.

## 2019-12-02 NOTE — Telephone Encounter (Signed)
Pt has been notified of lab results by phone with verbal understanding. Pt thanked me for the call and the good news. The patient has been notified of the result and verbalized understanding.  All questions (if any) were answered. Julaine Hua, [facility] 12/02/2019 9:46 AM

## 2019-12-28 ENCOUNTER — Ambulatory Visit (INDEPENDENT_AMBULATORY_CARE_PROVIDER_SITE_OTHER): Payer: Medicare Other

## 2019-12-28 DIAGNOSIS — I255 Ischemic cardiomyopathy: Secondary | ICD-10-CM | POA: Diagnosis not present

## 2019-12-28 DIAGNOSIS — I5022 Chronic systolic (congestive) heart failure: Secondary | ICD-10-CM

## 2019-12-28 LAB — CUP PACEART REMOTE DEVICE CHECK
Battery Remaining Longevity: 67 mo
Battery Voltage: 2.97 V
Brady Statistic RV Percent Paced: 0.01 %
Date Time Interrogation Session: 20211128192047
HighPow Impedance: 70 Ohm
Implantable Lead Implant Date: 20150810
Implantable Lead Location: 753860
Implantable Lead Model: 6935
Implantable Pulse Generator Implant Date: 20150810
Lead Channel Impedance Value: 361 Ohm
Lead Channel Impedance Value: 456 Ohm
Lead Channel Pacing Threshold Amplitude: 0.5 V
Lead Channel Pacing Threshold Pulse Width: 0.4 ms
Lead Channel Sensing Intrinsic Amplitude: 9 mV
Lead Channel Sensing Intrinsic Amplitude: 9 mV
Lead Channel Setting Pacing Amplitude: 2 V
Lead Channel Setting Pacing Pulse Width: 0.4 ms
Lead Channel Setting Sensing Sensitivity: 0.3 mV

## 2020-01-01 NOTE — Progress Notes (Signed)
Remote ICD transmission.   

## 2020-01-28 ENCOUNTER — Other Ambulatory Visit: Payer: Self-pay

## 2020-01-28 MED ORDER — ATORVASTATIN CALCIUM 80 MG PO TABS: ORAL_TABLET | ORAL | 3 refills | Status: DC

## 2020-03-23 ENCOUNTER — Telehealth: Payer: Self-pay | Admitting: Nurse Practitioner

## 2020-03-23 NOTE — Telephone Encounter (Signed)
Left message with moved appointment due to provider's template. Gave option to call back to reschedule if needed.

## 2020-03-28 ENCOUNTER — Ambulatory Visit: Payer: Medicare Other

## 2020-03-30 LAB — CUP PACEART REMOTE DEVICE CHECK
Battery Remaining Longevity: 57 mo
Battery Voltage: 2.96 V
Brady Statistic RV Percent Paced: 0.01 %
Date Time Interrogation Session: 20220228012807
HighPow Impedance: 64 Ohm
Implantable Lead Implant Date: 20150810
Implantable Lead Location: 753860
Implantable Lead Model: 6935
Implantable Pulse Generator Implant Date: 20150810
Lead Channel Impedance Value: 361 Ohm
Lead Channel Impedance Value: 456 Ohm
Lead Channel Pacing Threshold Amplitude: 0.5 V
Lead Channel Pacing Threshold Pulse Width: 0.4 ms
Lead Channel Sensing Intrinsic Amplitude: 7.625 mV
Lead Channel Sensing Intrinsic Amplitude: 7.625 mV
Lead Channel Setting Pacing Amplitude: 2 V
Lead Channel Setting Pacing Pulse Width: 0.4 ms
Lead Channel Setting Sensing Sensitivity: 0.3 mV

## 2020-04-01 MED ORDER — LISINOPRIL 2.5 MG PO TABS: 2.5000 mg | ORAL_TABLET | Freq: Every day | ORAL | 3 refills | Status: DC

## 2020-04-12 NOTE — Progress Notes (Deleted)
[place]   [phone] [phone]   Clinic Follow up Note   Patient Care Team: Patient, No Pcp Per as PCP - General (General Practice) Burnell Blanks, MD as PCP - Cardiology (Cardiology) [name], MD as Consulting Physician (Cardiology) [name], MD as Consulting Physician (Cardiology) [name], MD as Consulting Physician (Hematology) [name], MD as Consulting Physician (General Surgery) 04/12/2020  CHIEF COMPLAINT: Follow-up colon cancer  SUMMARY OF ONCOLOGIC HISTORY: Oncology History Overview Note  Cancer Staging Adenocarcinoma of descending colon  pT3, pN1a, pMX s/p colectomy/ostomy 05/18/2016 Staging form: Colon and Rectum, AJCC 8th Edition - Pathologic stage from 05/18/2016: Stage IIIB (pT3, pN1a, cM0) - Signed by [name], MD on 07/01/2016     Adenocarcinoma of descending colon  pT3, pN1a, pMX s/p colectomy/ostomy 05/18/2016  05/09/2016 Imaging   CT chest, abdomen and pelvis showed probable descending colon carcinoma. No evidence of distant metastasis. 7 cm hypervascular focus in the upper liver, not typical for metastatic disease.   05/16/2016 Procedure   Colonoscopy showed a large mass which completely obstructss the distal descending colon. Biopsied. Diverticulosis in the left colon.   05/18/2016 Surgery   Left hemicolectomy by Dr. [name]   05/18/2016 Pathology Results   Left colon segmental resection shows invasive colorectal adenocarcinoma, 4.6 cm, tumor extends into subserosa connective tissue, margins are negative, metastatic carcinoma in 1 of 12 lymph nodes. Grade 2, lymphovascular invasion present, perineural invasion negative.   05/18/2016 Miscellaneous   MSI-stable    05/18/2016 Initial Diagnosis   Adenocarcinoma of descending colon  pT3, pN1a, pMX s/p colectomy/ostomy 05/18/2016   07/16/2016 - 08/22/2016 Chemotherapy   Xeloda twice a day for 2 weeks on and 1 week off.  stopped after 3 days due  to severe diarrhea, and changed to 1551m bid, started on 07/31/2016; Hold Xeloda starting 08/22/16 due to AKI. Will lower Xeloda to 3 in the morning and 2 in the evening when restart next cycle.   Stopped due to poor tolerance of sever diarrhea.     01/14/2017 Imaging   CT CAP W Contrast 01/14/17  IMPRESSION: 1. Stable exam. No new or progressive findings in the chest, abdomen, or pelvis. The tiny hypervascular focus in the dome of the liver seen on the previous study is not evident on today's exam. No evidence for metastatic disease on today's CT scan. 2.  Aortic Atherosclerois (ICD10-170.0) 3. Distal transverse end colostomy. 4.  Emphysema. (ICD10-J43.9)   04/01/2017 Procedure   04/01/2017 - One 6 mm polyp in the cecum, removed with a cold snare. Resected and retrieved. - Diverticulosis in the left colon.   04/01/2017 Pathology Results   Colon, polyp(s), cecal cold snare - TUBULAR ADENOMA (ONE FRAGMENT). - NO HIGH GRADE DYSPLASIA OR MALIGNANCY.   05/20/2017 Surgery   Surgery:05/20/2017 COLOSTOMY TAKEDOWN BGeorganna SkeansMD    05/20/2017 Pathology Results   Pathology: 05/20/2017 Stoma - FINDINGS CONSISTENT WITH OSTOMY. - NO DYSPLASIA OR MALIGNANCY.    06/02/2017 Imaging   06/02/2017 CT AP IMPRESSION: 1. Postsurgical wounds in the anterior abdominal wall with skin staples and edema. No dehiscence or discrete fluid collection. 2. Partial colectomy with anastomosis in left hemiabdomen. Mild edema surrounding site of anastomosis, probably postsurgical changes. 3. No abscess, bowel wall thickening, or obstruction identified.   06/12/2017 Imaging   06/12/2017 CT AP IMPRESSION: Improving appearance to what may have represented a focal anastomotic leak posterior to the left colon at the level of reanastomosis after colostomy takedown. An  area of probable extraluminal air and fluid is smaller and contains less extraluminal air. This is likely consistent with resolving abscess. Some  residual inflammation remains in this region. No associated bowel obstruction or free air.   06/09/2018 Imaging   CT CAP WO contrast  IMPRESSION: Stable exam. No evidence of recurrent or metastatic carcinoma within the chest, abdomen, or pelvis.  Aortic Atherosclerosis (ICD10-I70.0). Coronary artery atherosclerosis.   07/09/2019 Imaging   CT CAP  IMPRESSION: 1. No findings of recurrent malignancy. 2. Other imaging findings of potential clinical significance: Coronary atherosclerosis with left ventricular predominance. Cardiomegaly with left ventricular predominance. Fat density in the left ventricular myocardium along the anterior wall, septum, and apex compatible with prior myocardial infarction. Sigmoid colon diverticulosis. Lower lumbar spondylosis and degenerative disc disease. 3. Aortic atherosclerosis.   Aortic Atherosclerosis (ICD10-I70.0).     CURRENT THERAPY: Surveillance  INTERVAL HISTORY: Mr. Szeto returns for surveillance follow-up as scheduled.  He was last seen by Dr. Burr Medico on 10/15/2019.  Last colonoscopy on 04/01/2017 by Dr. Loletha Carrow showed an adenomatous polyp and was recommended to repeat in 1 year which is overdue.   REVIEW OF SYSTEMS:   Constitutional: Denies fevers, chills or abnormal weight loss Eyes: Denies blurriness of vision Ears, nose, mouth, throat, and face: Denies mucositis or sore throat Respiratory: Denies cough, dyspnea or wheezes Cardiovascular: Denies palpitation, chest discomfort or lower extremity swelling Gastrointestinal:  Denies nausea, heartburn or change in bowel habits Skin: Denies abnormal skin rashes Lymphatics: Denies new lymphadenopathy or easy bruising Neurological:Denies numbness, tingling or new weaknesses Behavioral/Psych: Mood is stable, no new changes  All other systems were reviewed with the patient and are negative.  MEDICAL HISTORY:  Past Medical History:  Diagnosis Date  . Adenocarcinoma of descending colon  pT3,  pN1a, pMX s/p colectomy/ostomy 05/18/2016 05/26/2016  . AICD (automatic cardioverter/defibrillator) present    09/07/13 Dr. Lovena Le post MI with EF 30% RV lead      Medtronic 6947 (serial number [id number]) Generator   Medtronic  Evera XT VR (serial Number [id number])   . Anemia   . Anemia due to blood loss 05/03/2016  . Anxiety   . Anxiety state 01/17/2015  . Automatic implantable cardioverter-defibrillator in situ    MDT Aug 2015 Dr. Lovena Le  . Benign neoplasm of cecum   . CAD (coronary artery disease), native coronary artery 07/03/2013   Cath 06/20/13  Normal left main, occluded LAD, occluded RCA, 50% circ EF 15%  3.0 x28 and 3.0 x 8 mm Xience stent Dr. Tamala Julian  To LAD   . Cancer ([place])   . Cardiomyopathy, ischemic   . Chronic anticoagulation 06/12/2017  . Chronic kidney disease   . Chronic systolic CHF (congestive heart failure) (HCC)    ECHO 08/05/13  EF 30%  Anterior akinesis and inferior hypokinesis   . COPD (chronic obstructive pulmonary disease) ([place])   . Essential hypertension 05/16/2016  . Financial difficulties 10/01/2013  . History of colon cancer   . Hyperlipidemia   . Intra-abdominal abscess 06/12/2017  . Iron deficiency anemia due to chronic blood loss 07/02/2016  . Mass of colon 04/2016  . Myocardial infarction ([place]) 04/29/2013  . Old anterior myocardial infarction 06/20/2013   per pt 12/22/2014 had an MI  . Partial bowel obstruction ([place]) 05/16/2016  . S/P colostomy takedown 05/20/2017  . Thrombocytopenia ([place]) 12/27/2014   Chronic    . Tobacco use 07/03/2013    SURGICAL HISTORY: Past Surgical History:  Procedure Laterality Date  . CARDIAC CATHETERIZATION  05/2013   . CARDIAC CATHETERIZATION N/A 12/28/2014   Procedure: Left Heart Cath and Coronary Angiography;  Surgeon: [name], MD;  Location: [facility];  Service: Cardiovascular;  Laterality: N/A;  . CARDIAC CATHETERIZATION N/A 12/28/2014   Procedure: Coronary Stent Intervention;  Surgeon: [name], MD;   Location: [facility];  Service: Cardiovascular;  Laterality: N/A;  . COLECTOMY WITH COLOSTOMY CREATION/HARTMANN PROCEDURE Left 05/18/2016   Procedure: COLECTOMY WITH OSTOMY CREATION/HARTMANN PROCEDURE;  Surgeon: [name], MD;  Location: [facility];  Service: General;  Laterality: Left;  . COLONOSCOPY    . COLONOSCOPY WITH PROPOFOL N/A 04/01/2017   Procedure: COLONOSCOPY WITH PROPOFOL;  Surgeon: [name], MD;  Location: [facility];  Service: Gastroenterology;  Laterality: N/A;  . COLOSTOMY TAKEDOWN N/A 05/20/2017   Procedure: COLOSTOMY TAKEDOWN;  Surgeon: [name], MD;  Location: [facility];  Service: General;  Laterality: N/A;  . CORONARY ANGIOPLASTY  05/2013   . FLEXIBLE SIGMOIDOSCOPY N/A 05/16/2016   Procedure: FLEXIBLE SIGMOIDOSCOPY;  Surgeon: [name], MD;  Location: [facility];  Service: Gastroenterology;  Laterality: N/A;  . HAND SURGERY Right    cyst removal  . ICD placement  09/07/2013   . IMPLANTABLE CARDIOVERTER DEFIBRILLATOR IMPLANT N/A 09/07/2013   Procedure: IMPLANTABLE CARDIOVERTER DEFIBRILLATOR IMPLANT;  Surgeon: [name], MD;  Location: [facility];  Service: Cardiovascular;  Laterality: N/A;  . INTRA-AORTIC BALLOON PUMP INSERTION  06/20/2013   Procedure: INTRA-AORTIC BALLOON PUMP INSERTION;  Surgeon: Sinclair Grooms, MD;  Location: [facility] CATH LAB;  Service: Cardiovascular;;  . LEFT HEART CATHETERIZATION WITH CORONARY ANGIOGRAM N/A 06/20/2013   Procedure: LEFT HEART CATHETERIZATION WITH CORONARY ANGIOGRAM;  Surgeon: Sinclair Grooms, MD;  Location[facility] CATH LAB;  Service: Cardiovascular;  Laterality: N/A;  . PERCUTANEOUS CORONARY STENT INTERVENTION (PCI-S)  06/20/2013   Procedure: PERCUTANEOUS CORONARY STENT INTERVENTION (PCI-S);  Surgeon: Sinclair Grooms, MD;  Location: [facility] CATH LAB;  Service: Cardiovascular;;    I have reviewed the social history and family history with the patient and they are unchanged from previous note.  ALLERGIES:  is  allergic to capecitabine, celebrex [celecoxib], and contrast media [iodinated diagnostic agents].  MEDICATIONS:  Current Outpatient Medications  Medication Sig Dispense Refill  . aspirin 81 MG chewable tablet Chew 1 tablet (81 mg total) by mouth daily. 30 tablet 5  . atorvastatin (LIPITOR) 80 MG tablet TAKE 1 TABLET BY MOUTH ONCE DAILY AT  6PM 90 tablet 3  . carvedilol (COREG) 3.125 MG tablet Take 1 tablet (3.125 mg total) by mouth 2 (two) times daily with a meal. 180 tablet 3  . clopidogrel (PLAVIX) 75 MG tablet Take 1 tablet (75 mg total) by mouth daily. 90 tablet 3  . digoxin (LANOXIN) 0.125 MG tablet Take 1 tablet (0.125 mg total) by mouth daily. 90 tablet 3  . lisinopril (ZESTRIL) 2.5 MG tablet Take 1 tablet (2.5 mg total) by mouth daily. 90 tablet 3  . Multiple Vitamin (MULTIVITAMIN WITH MINERALS) TABS tablet Take 1 tablet by mouth daily. 30 tablet 0  . nitroGLYCERIN (NITROSTAT) 0.4 MG SL tablet Place 1 tablet (0.4 mg total) under the tongue every 5 (five) minutes as needed for chest pain. 25 tablet 6  . PEG-KCl-NaCl-NaSulf-Na Asc-C (PLENVU) 140 g SOLR Take 1 kit by mouth as directed. 1 each 0  . saccharomyces boulardii (FLORASTOR) 250 MG capsule Take 1 capsule (250 mg total) by mouth 2 (two) times daily. 30 capsule 0  No current facility-administered medications for this visit.   Facility-Administered Medications Ordered in Other Visits  Medication Dose Route Frequency Provider Last Rate Last Admin  . influenza vac split quadrivalent PF (FLUARIX) injection 0.5 mL  0.5 mL Intramuscular Once [name], MD        PHYSICAL EXAMINATION: ECOG PERFORMANCE STATUS: {CHL ONC ECOG XI:[id number]}  There were no vitals filed for this visit. There were no vitals filed for this visit.  GENERAL:alert, no distress and comfortable SKIN: skin color, texture, turgor are normal, no rashes or significant lesions EYES: normal, Conjunctiva are pink and non-injected, sclera clear OROPHARYNX:no  exudate, no erythema and lips, buccal mucosa, and tongue normal  NECK: supple, thyroid normal size, non-tender, without nodularity LYMPH:  no palpable lymphadenopathy in the cervical, axillary or inguinal LUNGS: clear to auscultation and percussion with normal breathing effort HEART: regular rate & rhythm and no murmurs and no lower extremity edema ABDOMEN:abdomen soft, non-tender and normal bowel sounds Musculoskeletal:no cyanosis of digits and no clubbing  NEURO: alert & oriented x 3 with fluent speech, no focal motor/sensory deficits  LABORATORY DATA:  I have reviewed the data as listed CBC Latest Ref Rng & Units 10/13/2019 07/09/2019 10/13/2018  WBC 4.0 - 10.5 K/uL 6.5 6.8 6.1  Hemoglobin 13.0 - 17.0 g/dL 13.4 14.7 13.8  Hematocrit 39.0 - 52.0 % 39.9 44.8 40.7  Platelets 150 - 400 K/uL 118(L) 129(L) 105(L)     CMP Latest Ref Rng & Units 11/30/2019 10/13/2019 07/09/2019  Glucose 70 - 99 mg/dL - 136(H) 103(H)  BUN 6 - 20 mg/dL - 18 26(H)  Creatinine 0.61 - 1.24 mg/dL - 1.26(H) 1.42(H)  Sodium 135 - 145 mmol/L - 138 139  Potassium 3.5 - 5.1 mmol/L - 4.2 4.8  Chloride 98 - 111 mmol/L - 104 104  CO2 22 - 32 mmol/L - 29 25  Calcium 8.9 - 10.3 mg/dL - 9.5 9.7  Total Protein 6.0 - 8.5 g/dL 6.6 6.6 7.6  Total Bilirubin 0.0 - 1.2 mg/dL 0.4 0.4 0.4  Alkaline Phos 44 - 121 IU/L 65 58 64  AST 0 - 40 IU/L 19 19 22   ALT 0 - 44 IU/L 24 22 27       RADIOGRAPHIC STUDIES: I have personally reviewed the radiological images as listed and agreed with the findings in the report. No results found.   ASSESSMENT & PLAN:  No problem-specific Assessment & Plan notes found for this encounter.   No orders of the defined types were placed in this encounter.  All questions were answered. The patient knows to call the clinic with any problems, questions or concerns. No barriers to learning was detected. I spent {CHL ONC TIME VISIT - QBHAL:[id number]} counseling the patient face to face. The total time  spent in the appointment was {CHL ONC TIME VISIT - DZHGD:[id number]} and more than 50% was on counseling and review of test results     Alla Feeling, NP 04/12/20

## 2020-04-13 ENCOUNTER — Encounter: Payer: Self-pay | Admitting: Hematology

## 2020-04-13 ENCOUNTER — Inpatient Hospital Stay (HOSPITAL_BASED_OUTPATIENT_CLINIC_OR_DEPARTMENT_OTHER): Payer: Medicare Other | Admitting: Hematology

## 2020-04-13 ENCOUNTER — Inpatient Hospital Stay: Payer: Medicare Other | Attending: Nurse Practitioner

## 2020-04-13 ENCOUNTER — Telehealth: Payer: Self-pay | Admitting: Hematology

## 2020-04-13 ENCOUNTER — Other Ambulatory Visit: Payer: Self-pay

## 2020-04-13 VITALS — BP 122/94 | HR 80 | Temp 97.8°F | Resp 20 | Ht 67.0 in | Wt 178.5 lb

## 2020-04-13 DIAGNOSIS — E785 Hyperlipidemia, unspecified: Secondary | ICD-10-CM | POA: Insufficient documentation

## 2020-04-13 DIAGNOSIS — J449 Chronic obstructive pulmonary disease, unspecified: Secondary | ICD-10-CM | POA: Diagnosis not present

## 2020-04-13 DIAGNOSIS — Z86010 Personal history of colonic polyps: Secondary | ICD-10-CM | POA: Insufficient documentation

## 2020-04-13 DIAGNOSIS — Z886 Allergy status to analgesic agent status: Secondary | ICD-10-CM | POA: Diagnosis not present

## 2020-04-13 DIAGNOSIS — N189 Chronic kidney disease, unspecified: Secondary | ICD-10-CM | POA: Insufficient documentation

## 2020-04-13 DIAGNOSIS — D696 Thrombocytopenia, unspecified: Secondary | ICD-10-CM | POA: Diagnosis not present

## 2020-04-13 DIAGNOSIS — Z79899 Other long term (current) drug therapy: Secondary | ICD-10-CM | POA: Diagnosis not present

## 2020-04-13 DIAGNOSIS — I700 Atherosclerosis of aorta: Secondary | ICD-10-CM | POA: Diagnosis not present

## 2020-04-13 DIAGNOSIS — Z87891 Personal history of nicotine dependence: Secondary | ICD-10-CM | POA: Insufficient documentation

## 2020-04-13 DIAGNOSIS — M5136 Other intervertebral disc degeneration, lumbar region: Secondary | ICD-10-CM | POA: Diagnosis not present

## 2020-04-13 DIAGNOSIS — I130 Hypertensive heart and chronic kidney disease with heart failure and stage 1 through stage 4 chronic kidney disease, or unspecified chronic kidney disease: Secondary | ICD-10-CM | POA: Diagnosis not present

## 2020-04-13 DIAGNOSIS — Z85038 Personal history of other malignant neoplasm of large intestine: Secondary | ICD-10-CM | POA: Diagnosis not present

## 2020-04-13 DIAGNOSIS — M47816 Spondylosis without myelopathy or radiculopathy, lumbar region: Secondary | ICD-10-CM | POA: Insufficient documentation

## 2020-04-13 DIAGNOSIS — Z933 Colostomy status: Secondary | ICD-10-CM | POA: Diagnosis not present

## 2020-04-13 DIAGNOSIS — C186 Malignant neoplasm of descending colon: Secondary | ICD-10-CM

## 2020-04-13 DIAGNOSIS — I5022 Chronic systolic (congestive) heart failure: Secondary | ICD-10-CM | POA: Insufficient documentation

## 2020-04-13 DIAGNOSIS — Z7902 Long term (current) use of antithrombotics/antiplatelets: Secondary | ICD-10-CM | POA: Insufficient documentation

## 2020-04-13 DIAGNOSIS — Z7901 Long term (current) use of anticoagulants: Secondary | ICD-10-CM | POA: Diagnosis not present

## 2020-04-13 DIAGNOSIS — I252 Old myocardial infarction: Secondary | ICD-10-CM | POA: Insufficient documentation

## 2020-04-13 DIAGNOSIS — K5730 Diverticulosis of large intestine without perforation or abscess without bleeding: Secondary | ICD-10-CM | POA: Diagnosis not present

## 2020-04-13 DIAGNOSIS — I2510 Atherosclerotic heart disease of native coronary artery without angina pectoris: Secondary | ICD-10-CM | POA: Diagnosis not present

## 2020-04-13 DIAGNOSIS — Z9049 Acquired absence of other specified parts of digestive tract: Secondary | ICD-10-CM | POA: Diagnosis not present

## 2020-04-13 DIAGNOSIS — C189 Malignant neoplasm of colon, unspecified: Secondary | ICD-10-CM

## 2020-04-13 LAB — CBC WITH DIFFERENTIAL/PLATELET
Abs Immature Granulocytes: 0.02 10*3/uL (ref 0.00–0.07)
Basophils Absolute: 0 10*3/uL (ref 0.0–0.1)
Basophils Relative: 0 %
Eosinophils Absolute: 0.1 10*3/uL (ref 0.0–0.5)
Eosinophils Relative: 2 %
HCT: 44.2 % (ref 39.0–52.0)
Hemoglobin: 14.6 g/dL (ref 13.0–17.0)
Immature Granulocytes: 0 %
Lymphocytes Relative: 34 %
Lymphs Abs: 2.3 10*3/uL (ref 0.7–4.0)
MCH: 30.7 pg (ref 26.0–34.0)
MCHC: 33 g/dL (ref 30.0–36.0)
MCV: 93.1 fL (ref 80.0–100.0)
Monocytes Absolute: 0.7 10*3/uL (ref 0.1–1.0)
Monocytes Relative: 10 %
Neutro Abs: 3.6 10*3/uL (ref 1.7–7.7)
Neutrophils Relative %: 54 %
Platelets: 129 10*3/uL — ABNORMAL LOW (ref 150–400)
RBC: 4.75 MIL/uL (ref 4.22–5.81)
RDW: 13.4 % (ref 11.5–15.5)
WBC: 6.8 10*3/uL (ref 4.0–10.5)
nRBC: 0 % (ref 0.0–0.2)

## 2020-04-13 LAB — COMPREHENSIVE METABOLIC PANEL
ALT: 31 U/L (ref 0–44)
AST: 25 U/L (ref 15–41)
Albumin: 4.2 g/dL (ref 3.5–5.0)
Alkaline Phosphatase: 63 U/L (ref 38–126)
Anion gap: 7 (ref 5–15)
BUN: 25 mg/dL — ABNORMAL HIGH (ref 6–20)
CO2: 28 mmol/L (ref 22–32)
Calcium: 9.7 mg/dL (ref 8.9–10.3)
Chloride: 104 mmol/L (ref 98–111)
Creatinine, Ser: 1.47 mg/dL — ABNORMAL HIGH (ref 0.61–1.24)
GFR, Estimated: 55 mL/min — ABNORMAL LOW (ref >60)
Glucose, Bld: 111 mg/dL — ABNORMAL HIGH (ref 70–99)
Potassium: 4.9 mmol/L (ref 3.5–5.1)
Sodium: 139 mmol/L (ref 135–145)
Total Bilirubin: 0.4 mg/dL (ref 0.3–1.2)
Total Protein: 7.3 g/dL (ref 6.5–8.1)

## 2020-04-13 LAB — CEA (IN HOUSE-CHCC): CEA (CHCC-In House): 4.19 ng/mL (ref 0.00–5.00)

## 2020-04-13 NOTE — Progress Notes (Signed)
[place]   [phone]) [phone] Fax:([phone]) [phone]   Clinic Follow up Note   Patient Care Team: Patient, No Pcp Per as PCP - General ([place]) Christopher Blanks, MD as PCP - Cardiology (Cardiology) [name], MD as Consulting Physician (Cardiology) [name], MD as Consulting Physician (Cardiology) [name], MD as Consulting Physician (Hematology) [name], MD as Consulting Physician (General Surgery)  Date of Service:  04/13/2020  CHIEF COMPLAINT: F/u on left side colon cancer, stage IIIB  SUMMARY OF ONCOLOGIC HISTORY: Oncology History Overview Note  Cancer Staging Adenocarcinoma of descending colon  pT3, pN1a, pMX s/p colectomy/ostomy 05/18/2016 Staging form: Colon and Rectum, AJCC 8th Edition - Pathologic stage from 05/18/2016: Stage IIIB (pT3, pN1a, cM0) - Signed by [name], MD on 07/01/2016     Adenocarcinoma of descending colon  pT3, pN1a, pMX s/p colectomy/ostomy 05/18/2016  05/09/2016 Imaging   CT chest, abdomen and pelvis showed probable descending colon carcinoma. No evidence of distant metastasis. 7 cm hypervascular focus in the upper liver, not typical for metastatic disease.   05/16/2016 Procedure   Colonoscopy showed a large mass which completely obstructss the distal descending colon. Biopsied. Diverticulosis in the left colon.   05/18/2016 Surgery   Left hemicolectomy by Dr. [name]   05/18/2016 Pathology Results   Left colon segmental resection shows invasive colorectal adenocarcinoma, 4.6 cm, tumor extends into subserosa connective tissue, margins are negative, metastatic carcinoma in 1 of 12 lymph nodes. Grade 2, lymphovascular invasion present, perineural invasion negative.   05/18/2016 Miscellaneous   MSI-stable    05/18/2016 Initial Diagnosis   Adenocarcinoma of descending colon  pT3, pN1a, pMX s/p colectomy/ostomy 05/18/2016   07/16/2016 - 08/22/2016 Chemotherapy   Xeloda twice a day for 2 weeks on and  1 week off.  stopped after 3 days due to severe diarrhea, and changed to 1522m bid, started on 07/31/2016; Hold Xeloda starting 08/22/16 due to AKI. Will lower Xeloda to 3 in the morning and 2 in the evening when restart next cycle.   Stopped due to poor tolerance of sever diarrhea.     01/14/2017 Imaging   CT CAP W Contrast 01/14/17  IMPRESSION: 1. Stable exam. No new or progressive findings in the chest, abdomen, or pelvis. The tiny hypervascular focus in the dome of the liver seen on the previous study is not evident on today's exam. No evidence for metastatic disease on today's CT scan. 2.  Aortic Atherosclerois (ICD10-170.0) 3. Distal transverse end colostomy. 4.  Emphysema. (ICD10-J43.9)   04/01/2017 Procedure   04/01/2017 - One 6 mm polyp in the cecum, removed with a cold snare. Resected and retrieved. - Diverticulosis in the left colon.   04/01/2017 Pathology Results   Colon, polyp(s), cecal cold snare - TUBULAR ADENOMA (ONE FRAGMENT). - NO HIGH GRADE DYSPLASIA OR MALIGNANCY.   05/20/2017 Surgery   Surgery:05/20/2017 COLOSTOMY TAKEDOWN BGeorganna SkeansMD    05/20/2017 Pathology Results   Pathology: 05/20/2017 Stoma - FINDINGS CONSISTENT WITH OSTOMY. - NO DYSPLASIA OR MALIGNANCY.    06/02/2017 Imaging   06/02/2017 CT AP IMPRESSION: 1. Postsurgical wounds in the anterior abdominal wall with skin staples and edema. No dehiscence or discrete fluid collection. 2. Partial colectomy with anastomosis in left hemiabdomen. Mild edema surrounding site of anastomosis, probably postsurgical changes. 3. No abscess, bowel wall thickening, or obstruction identified.   06/12/2017 Imaging   06/12/2017 CT AP IMPRESSION: Improving appearance to what may have represented a focal anastomotic leak posterior to the left  colon at the level of reanastomosis after colostomy takedown. An area of probable extraluminal air and fluid is smaller and contains less extraluminal air. This is likely  consistent with resolving abscess. Some residual inflammation remains in this region. No associated bowel obstruction or free air.   06/09/2018 Imaging   CT CAP WO contrast  IMPRESSION: Stable exam. No evidence of recurrent or metastatic carcinoma within the chest, abdomen, or pelvis.  Aortic Atherosclerosis (ICD10-I70.0). Coronary artery atherosclerosis.   07/09/2019 Imaging   CT CAP  IMPRESSION: 1. No findings of recurrent malignancy. 2. Other imaging findings of potential clinical significance: Coronary atherosclerosis with left ventricular predominance. Cardiomegaly with left ventricular predominance. Fat density in the left ventricular myocardium along the anterior wall, septum, and apex compatible with prior myocardial infarction. Sigmoid colon diverticulosis. Lower lumbar spondylosis and degenerative disc disease. 3. Aortic atherosclerosis.   Aortic Atherosclerosis (ICD10-I70.0).      CURRENT THERAPY:  Surveillance   INTERVAL HISTORY:  Christopher [name] is here for a follow up. He denies pain or abdominal issues. He notes he has received his COVID booster at [facility] in October 2021. He notes he had side effects from the booster with chest discomfort and tightness temporarily. He is eating adequately and has no new concerns. He notes he struggles to get family to take him to his colonoscopy. He notes he sees Dr Lovena Le about his CKD.     REVIEW OF SYSTEMS:   Constitutional: Denies fevers, chills or abnormal weight loss Eyes: Denies blurriness of vision Ears, nose, mouth, throat, and face: Denies mucositis or sore throat Respiratory: Denies cough, dyspnea or wheezes Cardiovascular: Denies palpitation, chest discomfort or lower extremity swelling Gastrointestinal:  Denies nausea, heartburn or change in bowel habits Skin: Denies abnormal skin rashes Lymphatics: Denies new lymphadenopathy or easy bruising Neurological:Denies numbness, tingling or new  weaknesses Behavioral/Psych: Mood is stable, no new changes  All other systems were reviewed with the patient and are negative.  MEDICAL HISTORY:  Past Medical History:  Diagnosis Date  . Adenocarcinoma of descending colon  pT3, pN1a, pMX s/p colectomy/ostomy 05/18/2016 05/26/2016  . AICD (automatic cardioverter/defibrillator) present    09/07/13 Dr. Lovena Le post MI with EF 30% RV lead      Medtronic 6947 (serial number [id number]) Generator   Medtronic  Evera XT VR (serial Number [id number])   . Anemia   . Anemia due to blood loss 05/03/2016  . Anxiety   . Anxiety state 01/17/2015  . Automatic implantable cardioverter-defibrillator in situ    MDT Aug 2015 Dr. Lovena Le  . Benign neoplasm of cecum   . CAD (coronary artery disease), native coronary artery 07/03/2013   Cath 06/20/13  Normal left main, occluded LAD, occluded RCA, 50% circ EF 15%  3.0 x28 and 3.0 x 8 mm Xience stent Dr. Tamala Julian  To LAD   . Cancer ([place])   . Cardiomyopathy, ischemic   . Chronic anticoagulation 06/12/2017  . Chronic kidney disease   . Chronic systolic CHF (congestive heart failure) (HCC)    ECHO 08/05/13  EF 30%  Anterior akinesis and inferior hypokinesis   . COPD (chronic obstructive pulmonary disease) ([place])   . Essential hypertension 05/16/2016  . Financial difficulties 10/01/2013  . History of colon cancer   . Hyperlipidemia   . Intra-abdominal abscess 06/12/2017  . Iron deficiency anemia due to chronic blood loss 07/02/2016  . Mass of colon 04/2016  . Myocardial infarction ([place]) 04/29/2013  . Old anterior myocardial infarction 06/20/2013   per pt  12/22/2014 had an MI  . Partial bowel obstruction ([place]) 05/16/2016  . S/P colostomy takedown 05/20/2017  . Thrombocytopenia ([place]) 12/27/2014   Chronic    . Tobacco use 07/03/2013    SURGICAL HISTORY: Past Surgical History:  Procedure Laterality Date  . CARDIAC CATHETERIZATION  05/2013   . CARDIAC CATHETERIZATION N/A 12/28/2014   Procedure: Left Heart Cath and Coronary  Angiography;  Surgeon: [name], MD;  Location: [place];  Service: Cardiovascular;  Laterality: N/A;  . CARDIAC CATHETERIZATION N/A 12/28/2014   Procedure: Coronary Stent Intervention;  Surgeon: [name], MD;  Location: [place] [facility];  Service: Cardiovascular;  Laterality: N/A;  . COLECTOMY WITH COLOSTOMY CREATION/HARTMANN PROCEDURE Left 05/18/2016   Procedure: COLECTOMY WITH OSTOMY CREATION/HARTMANN PROCEDURE;  Surgeon: [name], MD;  Location: [facility] [place];  Service: General;  Laterality: Left;  . COLONOSCOPY    . COLONOSCOPY WITH PROPOFOL N/A 04/01/2017   Procedure: COLONOSCOPY WITH PROPOFOL;  Surgeon: [name], MD;  Location: [facility];  Service: Gastroenterology;  Laterality: N/A;  . COLOSTOMY TAKEDOWN N/A 05/20/2017   Procedure: COLOSTOMY TAKEDOWN;  Surgeon: [name], MD;  Location: [facility];  Service: General;  Laterality: N/A;  . CORONARY ANGIOPLASTY  05/2013   . FLEXIBLE SIGMOIDOSCOPY N/A 05/16/2016   Procedure: FLEXIBLE SIGMOIDOSCOPY;  Surgeon: [name], MD;  Location: [facility];  Service: Gastroenterology;  Laterality: N/A;  . HAND SURGERY Right    cyst removal  . ICD placement  09/07/2013   . IMPLANTABLE CARDIOVERTER DEFIBRILLATOR IMPLANT N/A 09/07/2013   Procedure: IMPLANTABLE CARDIOVERTER DEFIBRILLATOR IMPLANT;  Surgeon: [name], MD;  Location: [facility] C[facility] LAB;  Service: Cardiovascular;  Laterality: N/A;  . INTRA-AORTIC BALLOON PUMP INSERTION  06/20/2013   Procedure: INTRA-AORTIC BALLOON PUMP INSERTION;  Surgeon: Sinclair Grooms, MD;  Location: [facility] CATH LAB;  Service: Cardiovascular;;  . LEFT HEART CATHETERIZATION WITH CORONARY ANGIOGRAM N/A 06/20/2013   Procedure: LEFT HEART CATHETERIZATION WITH CORONARY ANGIOGRAM;  Surgeon: Sinclair Grooms, MD;  Location: [facility] CATH LAB;  Service: Cardiovascular;  Laterality: N/A;  . PERCUTANEOUS CORONARY STENT INTERVENTION (PCI-S)  06/20/2013   Procedure: PERCUTANEOUS CORONARY STENT INTERVENTION  (PCI-S);  Surgeon: Sinclair Grooms, MD;  Location[facility] CATH LAB;  Service: Cardiovascular;;    I have reviewed the social history and family history with the patient and they are unchanged from previous note.  ALLERGIES:  is allergic to capecitabine, celebrex [celecoxib], and contrast media [iodinated diagnostic agents].  MEDICATIONS:  Current Outpatient Medications  Medication Sig Dispense Refill  . aspirin 81 MG chewable tablet Chew 1 tablet (81 mg total) by mouth daily. 30 tablet 5  . atorvastatin (LIPITOR) 80 MG tablet TAKE 1 TABLET BY MOUTH ONCE DAILY AT  6PM 90 tablet 3  . carvedilol (COREG) 3.125 MG tablet Take 1 tablet (3.125 mg total) by mouth 2 (two) times daily with a meal. 180 tablet 3  . clopidogrel (PLAVIX) 75 MG tablet Take 1 tablet (75 mg total) by mouth daily. 90 tablet 3  . digoxin (LANOXIN) 0.125 MG tablet Take 1 tablet (0.125 mg total) by mouth daily. 90 tablet 3  . lisinopril (ZESTRIL) 2.5 MG tablet Take 1 tablet (2.5 mg total) by mouth daily. 90 tablet 3  . Multiple Vitamin (MULTIVITAMIN WITH MINERALS) TABS tablet Take 1 tablet by mouth daily. 30 tablet 0  . nitroGLYCERIN (NITROSTAT) 0.4 MG SL tablet Place 1 tablet (0.4 mg total) under the tongue every 5 (five) minutes  as needed for chest pain. 25 tablet 6  . PEG-KCl-NaCl-NaSulf-Na Asc-C (PLENVU) 140 g SOLR Take 1 kit by mouth as directed. 1 each 0  . saccharomyces boulardii (FLORASTOR) 250 MG capsule Take 1 capsule (250 mg total) by mouth 2 (two) times daily. 30 capsule 0   No current facility-administered medications for this visit.   Facility-Administered Medications Ordered in Other Visits  Medication Dose Route Frequency Provider Last Rate Last Admin  . influenza vac split quadrivalent PF (FLUARIX) injection 0.5 mL  0.5 mL Intramuscular Once [name], MD        PHYSICAL EXAMINATION: ECOG PERFORMANCE STATUS: 0 - Asymptomatic  Vitals:   04/13/20 0830  BP: (!) 122/94  Pulse: 80  Resp: 20  Temp: 97.8 F  (36.6 C)  SpO2: 97%   Filed Weights   04/13/20 0830  Weight: 178 lb 8 oz (81 kg)    GENERAL:alert, no distress and comfortable SKIN: skin color, texture, turgor are normal, no rashes or significant lesions EYES: normal, Conjunctiva are pink and non-injected, sclera clear  NECK: supple, thyroid normal size, non-tender, without nodularity LYMPH:  no palpable lymphadenopathy in the cervical, axillary  LUNGS: clear to auscultation and percussion with normal breathing effort HEART: regular rate & rhythm and no murmurs and no lower extremity edema ABDOMEN:abdomen soft, non-tender and normal bowel sounds Musculoskeletal:no cyanosis of digits and no clubbing  NEURO: alert & oriented x 3 with fluent speech, no focal motor/sensory deficits   LABORATORY DATA:  I have reviewed the data as listed CBC Latest Ref Rng & Units 04/13/2020 10/13/2019 07/09/2019  WBC 4.0 - 10.5 K/uL 6.8 6.5 6.8  Hemoglobin 13.0 - 17.0 g/dL 14.6 13.4 14.7  Hematocrit 39.0 - 52.0 % 44.2 39.9 44.8  Platelets 150 - 400 K/uL 129(L) 118(L) 129(L)     CMP Latest Ref Rng & Units 04/13/2020 11/30/2019 10/13/2019  Glucose 70 - 99 mg/dL 111(H) - 136(H)  BUN 6 - 20 mg/dL 25(H) - 18  Creatinine 0.61 - 1.24 mg/dL 1.47(H) - 1.26(H)  Sodium 135 - 145 mmol/L 139 - 138  Potassium 3.5 - 5.1 mmol/L 4.9 - 4.2  Chloride 98 - 111 mmol/L 104 - 104  CO2 22 - 32 mmol/L 28 - 29  Calcium 8.9 - 10.3 mg/dL 9.7 - 9.5  Total Protein 6.5 - 8.1 g/dL 7.3 6.6 6.6  Total Bilirubin 0.3 - 1.2 mg/dL 0.4 0.4 0.4  Alkaline Phos 38 - 126 U/L 63 65 58  AST 15 - 41 U/L _0 ALT 0 - 44 U/L _1 RADIOGRAPHIC STUDIES: I have personally reviewed the radiological images as listed and agreed with the findings in the report. No results found.   ASSESSMENT & PLAN:  Christopher [name] is a 60 y.o. male with    1. Invasive Colorectal Adenocarcinoma of the Left Colon, pT3N1aM0, Stage IIIB, MSI-stable -Diagnosed in 04/2016. Treated with left  hemicolectomy andadjuvantchemo. He is currently on surveillance. -Per pt he previously had a bad reaction toCT ivcontrast.Will avoid in the future. -His last colonoscopy was in 03/2017 and had 1 small benign polyp removed. He was supposed to have repeated colonoscopy one year after but has not been able to do due to lack of person who can go with him for the procedure. I encouraged him to talk to his brother if he can help him and get it done this year, he will try -He is clinically doing well. Labs reviewed, CBC and  CMP WNL except stable and mild thrombocytopenia, BG 111, BUN 25, Cr 1.47. CEA is still pending. Physical exam unremarkable. There is no clinical concern for recurrence.  -He is 4 years since his cancer diagnosis. His risk of recurrence has significantly decreased. Will continue 5 year surveillance plan. I do not plan to scan him again unless he develops concerning symptoms.  -f/u in 1 year.    2. CAD and systolic CHF with EF 94-17%, COPD, CKD -He previously quit smoking  -s/p 2 stent placements and a pace maker. -On Plavix -Continue tof/u with cardiology -He has CKD since 2020. His Cr has increased to 1.47. I discussed f/u with his PCP and cardiology and drinking more water.    Plan -He is clinically doing very well, lab reviewed, no concerns -Lab and follow-up in 1 year.  -Copy note to PCP and cardiologist about his CKD.     No problem-specific Assessment & Plan notes found for this encounter.   No orders of the defined types were placed in this encounter.  All questions were answered. The patient knows to call the clinic with any problems, questions or concerns. No barriers to learning was detected. The total time spent in the appointment was 25 minutes.     [name], MD 04/13/2020   I, Joslyn Devon, am acting as scribe for [name], MD.   I have reviewed the above documentation for accuracy and completeness, and I agree with the above.

## 2020-04-13 NOTE — Telephone Encounter (Signed)
Scheduled per los. Gave avs and calendar  

## 2020-04-14 ENCOUNTER — Encounter: Payer: Self-pay | Admitting: Hematology

## 2020-04-15 MED ORDER — DIGOXIN 125 MCG PO TABS: 0.1250 mg | ORAL_TABLET | Freq: Every day | ORAL | 2 refills | Status: DC

## 2020-05-20 MED ORDER — CARVEDILOL 3.125 MG PO TABS: 3.1250 mg | ORAL_TABLET | Freq: Two times a day (BID) | ORAL | 1 refills | Status: DC

## 2020-06-17 ENCOUNTER — Other Ambulatory Visit: Payer: Self-pay | Admitting: Cardiovascular Disease

## 2020-06-17 MED ORDER — NITROGLYCERIN 0.4 MG SL SUBL: 0.4000 mg | SUBLINGUAL_TABLET | SUBLINGUAL | 6 refills | Status: DC | PRN

## 2020-06-27 LAB — CUP PACEART REMOTE DEVICE CHECK
Battery Remaining Longevity: 58 mo
Battery Voltage: 2.95 V
Brady Statistic RV Percent Paced: 0.01 %
Date Time Interrogation Session: 20220530044225
HighPow Impedance: 80 Ohm
Implantable Lead Implant Date: 20150810
Implantable Lead Location: 753860
Implantable Lead Model: 6935
Implantable Pulse Generator Implant Date: 20150810
Lead Channel Impedance Value: 361 Ohm
Lead Channel Impedance Value: 475 Ohm
Lead Channel Pacing Threshold Amplitude: 0.5 V
Lead Channel Pacing Threshold Pulse Width: 0.4 ms
Lead Channel Sensing Intrinsic Amplitude: 7.5 mV
Lead Channel Sensing Intrinsic Amplitude: 7.5 mV
Lead Channel Setting Pacing Amplitude: 2 V
Lead Channel Setting Pacing Pulse Width: 0.4 ms
Lead Channel Setting Sensing Sensitivity: 0.3 mV

## 2020-06-28 ENCOUNTER — Ambulatory Visit (INDEPENDENT_AMBULATORY_CARE_PROVIDER_SITE_OTHER): Payer: Medicare Other

## 2020-06-28 DIAGNOSIS — Z95810 Presence of automatic (implantable) cardiac defibrillator: Secondary | ICD-10-CM

## 2020-06-28 DIAGNOSIS — I255 Ischemic cardiomyopathy: Secondary | ICD-10-CM | POA: Diagnosis not present

## 2020-07-19 NOTE — Progress Notes (Signed)
Remote ICD transmission.   

## 2020-08-22 ENCOUNTER — Encounter: Payer: Self-pay | Admitting: Internal Medicine

## 2020-09-27 ENCOUNTER — Ambulatory Visit (INDEPENDENT_AMBULATORY_CARE_PROVIDER_SITE_OTHER): Payer: Medicare Other

## 2020-09-27 DIAGNOSIS — I5022 Chronic systolic (congestive) heart failure: Secondary | ICD-10-CM

## 2020-09-27 LAB — CUP PACEART REMOTE DEVICE CHECK
Battery Remaining Longevity: 57 mo
Battery Voltage: 2.95 V
Brady Statistic RV Percent Paced: 0.01 %
Date Time Interrogation Session: 20220829143442
HighPow Impedance: 68 Ohm
Implantable Lead Implant Date: 20150810
Implantable Lead Location: 753860
Implantable Lead Model: 6935
Implantable Pulse Generator Implant Date: 20150810
Lead Channel Impedance Value: 361 Ohm
Lead Channel Impedance Value: 456 Ohm
Lead Channel Pacing Threshold Amplitude: 0.625 V
Lead Channel Pacing Threshold Pulse Width: 0.4 ms
Lead Channel Sensing Intrinsic Amplitude: 7.125 mV
Lead Channel Sensing Intrinsic Amplitude: 7.125 mV
Lead Channel Setting Pacing Amplitude: 2 V
Lead Channel Setting Pacing Pulse Width: 0.4 ms
Lead Channel Setting Sensing Sensitivity: 0.3 mV

## 2020-10-10 NOTE — Progress Notes (Signed)
Remote ICD transmission.   

## 2020-11-01 DIAGNOSIS — Z23 Encounter for immunization: Secondary | ICD-10-CM | POA: Diagnosis not present

## 2020-11-28 MED ORDER — CLOPIDOGREL BISULFATE 75 MG PO TABS: 75.0000 mg | ORAL_TABLET | Freq: Every day | ORAL | 0 refills | Status: DC

## 2020-11-28 MED ORDER — CARVEDILOL 3.125 MG PO TABS: 3.1250 mg | ORAL_TABLET | Freq: Two times a day (BID) | ORAL | 0 refills | Status: DC

## 2020-11-29 ENCOUNTER — Encounter: Payer: Self-pay | Admitting: Internal Medicine

## 2020-11-29 ENCOUNTER — Other Ambulatory Visit: Payer: Self-pay

## 2020-11-29 ENCOUNTER — Ambulatory Visit (INDEPENDENT_AMBULATORY_CARE_PROVIDER_SITE_OTHER): Payer: Medicare Other | Admitting: Internal Medicine

## 2020-11-29 VITALS — BP 132/74 | HR 73 | Ht 67.5 in | Wt 177.2 lb

## 2020-11-29 DIAGNOSIS — I255 Ischemic cardiomyopathy: Secondary | ICD-10-CM | POA: Diagnosis not present

## 2020-11-29 DIAGNOSIS — I2510 Atherosclerotic heart disease of native coronary artery without angina pectoris: Secondary | ICD-10-CM | POA: Diagnosis not present

## 2020-11-29 DIAGNOSIS — I5022 Chronic systolic (congestive) heart failure: Secondary | ICD-10-CM

## 2020-11-29 DIAGNOSIS — Z95810 Presence of automatic (implantable) cardiac defibrillator: Secondary | ICD-10-CM | POA: Diagnosis not present

## 2020-11-29 NOTE — Progress Notes (Signed)
HPI Christopher [name] returns today for followup. He has a longstanding ICM, chronic class 2 CHF, dyslipidemia, s/p ICD insertion. In the interim he has had some GI complaints and is pending Colonoscopy. He has not had chest pain, sob or edema. No ICD therapy.  Allergies  Allergen Reactions   Capecitabine Other (See Comments)    Server dehydration and diarrhea       Celebrex [Celecoxib] Other (See Comments)    Caused his back to burn   Contrast Media [Iodinated Diagnostic Agents] Other (See Comments)    Patient feels weakness     Current Outpatient Medications  Medication Sig Dispense Refill   aspirin 81 MG chewable tablet Chew 1 tablet (81 mg total) by mouth daily. 30 tablet 5   atorvastatin (LIPITOR) 80 MG tablet TAKE 1 TABLET BY MOUTH ONCE DAILY AT  6PM 90 tablet 3   carvedilol (COREG) 3.125 MG tablet Take 1 tablet (3.125 mg total) by mouth 2 (two) times daily with a meal. 180 tablet 0   clopidogrel (PLAVIX) 75 MG tablet Take 1 tablet (75 mg total) by mouth daily. 90 tablet 0   digoxin (LANOXIN) 0.125 MG tablet Take 1 tablet (0.125 mg total) by mouth daily. 90 tablet 2   lisinopril (ZESTRIL) 2.5 MG tablet Take 1 tablet (2.5 mg total) by mouth daily. 90 tablet 3   Multiple Vitamin (MULTIVITAMIN WITH MINERALS) TABS tablet Take 1 tablet by mouth daily. 30 tablet 0   nitroGLYCERIN (NITROSTAT) 0.4 MG SL tablet Place 1 tablet (0.4 mg total) under the tongue every 5 (five) minutes as needed for chest pain. 25 tablet 6   PEG-KCl-NaCl-NaSulf-Na Asc-C (PLENVU) 140 g SOLR Take 1 kit by mouth as directed. 1 each 0   saccharomyces boulardii (FLORASTOR) 250 MG capsule Take 1 capsule (250 mg total) by mouth 2 (two) times daily. 30 capsule 0   No current facility-administered medications for this visit.   Facility-Administered Medications Ordered in Other Visits  Medication Dose Route Frequency Provider Last Rate Last Admin   influenza vac split quadrivalent PF (FLUARIX) injection 0.5 mL  0.5  mL Intramuscular Once [name], MD         Past Medical History:  Diagnosis Date   Adenocarcinoma of descending colon  pT3, pN1a, pMX s/p colectomy/ostomy 05/18/2016 05/26/2016   AICD (automatic cardioverter/defibrillator) present    09/07/13 Dr. Lovena Le post MI with EF 30% RV lead      Medtronic 6947 (serial number [id number]) Generator   Medtronic  Evera XT VR (serial Number [id number])    Anemia    Anemia due to blood loss 05/03/2016   Anxiety    Anxiety state 01/17/2015   Automatic implantable cardioverter-defibrillator in situ    MDT Aug 2015 Dr. Lovena Le   Benign neoplasm of cecum    CAD (coronary artery disease), native coronary artery 07/03/2013   Cath 06/20/13  Normal left main, occluded LAD, occluded RCA, 50% circ EF 15%  3.0 x28 and 3.0 x 8 mm Xience stent Dr. Tamala Julian  To LAD    Cancer [facility])    Cardiomyopathy, ischemic    Chronic anticoagulation 06/12/2017   Chronic kidney disease    Chronic systolic CHF (congestive heart failure) ([place])    ECHO 08/05/13  EF 30%  Anterior akinesis and inferior hypokinesis    COPD (chronic obstructive pulmonary disease) ([place])    Essential hypertension 05/16/2016   Financial difficulties 10/01/2013   History of colon cancer    Hyperlipidemia  Intra-abdominal abscess 06/12/2017   Iron deficiency anemia due to chronic blood loss 07/02/2016   Mass of colon 04/2016   Myocardial infarction [facility]) 04/29/2013   Old anterior myocardial infarction 06/20/2013   per pt 12/22/2014 had an MI   Partial bowel obstruction ([place]) 05/16/2016   S/P colostomy takedown 05/20/2017   Thrombocytopenia ([place]) 12/27/2014   Chronic     Tobacco use 07/03/2013    ROS:   All systems reviewed and negative except as noted in the HPI.   Past Surgical History:  Procedure Laterality Date   CARDIAC CATHETERIZATION  05/2013    CARDIAC CATHETERIZATION N/A 12/28/2014   Procedure: Left Heart Cath and Coronary Angiography;  Surgeon: [name], MD;  Location: [place] CV LAB;  Service:  Cardiovascular;  Laterality: N/A;   CARDIAC CATHETERIZATION N/A 12/28/2014   Procedure: Coronary Stent Intervention;  Surgeon: [name], MD;  Location: [place] CV LAB;  Service: Cardiovascular;  Laterality: N/A;   COLECTOMY WITH COLOSTOMY CREATION/HARTMANN PROCEDURE Left 05/18/2016   Procedure: COLECTOMY WITH OSTOMY CREATION/HARTMANN PROCEDURE;  Surgeon: [name], MD;  Location: [place];  Service: General;  Laterality: Left;   COLONOSCOPY     COLONOSCOPY WITH PROPOFOL N/A 04/01/2017   Procedure: COLONOSCOPY WITH PROPOFOL;  Surgeon: [name], MD;  [facility];  Service: Gastroenterology;  Laterality: N/A;   COLOSTOMY TAKEDOWN N/A 05/20/2017   Procedure: COLOSTOMY TAKEDOWN;  Surgeon: [name], MD;  Location: [place];  Service: General;  Laterality: N/A;   CORONARY ANGIOPLASTY  05/2013    FLEXIBLE SIGMOIDOSCOPY N/A 05/16/2016   Procedure: FLEXIBLE SIGMOIDOSCOPY;  Surgeon: [name], MD;  Location: [place];  Service: Gastroenterology;  Laterality: N/A;   HAND SURGERY Right    cyst removal   ICD placement  09/07/2013    IMPLANTABLE CARDIOVERTER DEFIBRILLATOR IMPLANT N/A 09/07/2013   Procedure: IMPLANTABLE CARDIOVERTER DEFIBRILLATOR IMPLANT;  Surgeon: [name], MD;  Location: [facility] CATH LAB;  Service: Cardiovascular;  Laterality: N/A;   INTRA-AORTIC BALLOON PUMP INSERTION  06/20/2013   Procedure: INTRA-AORTIC BALLOON PUMP INSERTION;  Surgeon: Sinclair Grooms, MD;  Location: [facility] CATH LAB;  Service: Cardiovascular;;   LEFT HEART CATHETERIZATION WITH CORONARY ANGIOGRAM N/A 06/20/2013   Procedure: LEFT HEART CATHETERIZATION WITH CORONARY ANGIOGRAM;  Surgeon: Sinclair Grooms, MD;  Location: [facility] CATH LAB;  Service: Cardiovascular;  Laterality: N/A;   PERCUTANEOUS CORONARY STENT INTERVENTION (PCI-S)  06/20/2013   Procedure: PERCUTANEOUS CORONARY STENT INTERVENTION (PCI-S);  Surgeon: Sinclair Grooms, MD;  Location: [facility] CATH LAB;  Service: Cardiovascular;;      Family History  Problem Relation Age of Onset   Emphysema Father    Lung disease Father    Heart disease Mother    Dementia Mother    Diabetes Mother    CVA Mother    Alzheimer's disease Mother    Pancreatic cancer Maternal Grandmother    Lung cancer Maternal Grandmother    Cancer Neg Hx      Social History   Socioeconomic History   Marital status: Single    Spouse name: Not on file   Number of children: 0   Years of education: 12   Highest education level: Not on file  Occupational History   Occupation: Disabled  Tobacco Use   Smoking status: Former    Packs/day: 2.00    Years: 40.00    Pack years: 80.00    Types: Cigarettes    Quit date: 12/22/2014  Years since quitting: 5.9   Smokeless tobacco: Never  Vaping Use   Vaping Use: Never used  Substance and Sexual Activity   Alcohol use: No    Comment: quit in 08/2013, he used to drink alcohol moderate (1 pine liquor one week)  for 30 years    Drug use: No   Sexual activity: Not Currently    Birth control/protection: Condom    Comment: men   Other Topics Concern   Not on file  Social History Narrative   Lives alone.    In a house trying to sell his home. Exercise: No.   Social Determinants of Health   Financial Resource Strain: Not on file  Food Insecurity: Not on file  Transportation Needs: Not on file  Physical Activity: Not on file  Stress: Not on file  Social Connections: Not on file  Intimate Partner Violence: Not on file     BP 132/74   Pulse 73   Ht 5' 7.5" (1.715 m)   Wt 177 lb 3.2 oz (80.4 kg)   SpO2 95%   BMI 27.34 kg/m   Physical Exam:  Well appearing NAD HEENT: Unremarkable Neck:  No JVD, no thyromegally Lymphatics:  No adenopathy Back:  No CVA tenderness Lungs:  Clear HEART:  Regular rate rhythm, no murmurs, no rubs, no clicks Abd:  soft, positive bowel sounds, no organomegally, no rebound, no guarding Ext:  2 plus pulses, no edema, no cyanosis, no clubbing Skin:  No  rashes no nodules Neuro:  CN II through XII intact, motor grossly intact  EKG  DEVICE  Normal device function.  See PaceArt for details.   Assess/Plan:  1. ICM - he is doing well with minimal symptoms. No change in meds. He is encouraged to remain active. 2. Chronic systolic heart failure - his symptoms are class 2. He will continue his current meds. 3. ICD - his medtronic single chamber ICD is working normally. 4. Dyslipidemia - he will  Continue his high dose statin. No muscle complaints.   Carleene Overlie Roddie Riegler,MD

## 2020-11-29 NOTE — Patient Instructions (Addendum)
Medication Instructions:  Your physician recommends that you continue on your current medications as directed. Please refer to the Current Medication list given to you today.  Labwork: None ordered.  Testing/Procedures: None ordered.  Follow-Up: Your physician wants you to follow-up in: one year with one of the following Advanced Practice Providers on your designated Care Team:   Tommye Standard, [place] Legrand Como "Jonni Sanger" Chalmers Cater, [place]  Remote monitoring is used to monitor your ICD from home. This monitoring reduces the number of office visits required to check your device to one time per year. It allows [place] to keep an eye on the functioning of your device to ensure it is working properly. You are scheduled for a device check from home on 12/27/2020. You may send your transmission at any time that day. If you have a wireless device, the transmission will be sent automatically. After your physician reviews your transmission, you will receive a postcard with your next transmission date.  Any Other Special Instructions Will Be Listed Below (If Applicable).  If you need a refill on your cardiac medications before your next appointment, please call your pharmacy.

## 2020-12-13 NOTE — Progress Notes (Signed)
Chief Complaint  Patient presents with   Follow-up    CAD     History of Present Illness: 60 yo male with history of anxiety, CAD with prior stenting of the LAD and obtuse marginal, ischemic cardiomyopathy s/p ICD, chronic kidney disease, COPD, chronic systolic CHF, HTN, hyperlipidemia, former tobacco abuse, iron deficiency anemia, colon cancer s/p colectomy here today for follow up. He has been followed in the past by Dr. Wynonia Lawman. Given Dr. Thurman Coyer absence due to illness, Mr. Messina was seen in our [facility] Cardiology office by Dr. Bettina Gavia 02/10/18 to establish care there. He has a complex past medical history. He has undergone a colectomy for colon cancer. He was admitted to [facility] May 2015 with a late presenting anterior STEMI. He was found to have chronic total occlusion of the mid RCA and a totally occluded proximal to mid LAD. There was moderate disease in the Circumflex artery. LVEF was 15-20%. Overlapping drug eluting stents were placed in the mid LAD. A balloon pump was placed in the cath lab. ICD implanted 09/07/13. His ICD has been followed in our EP clinic by Dr. Lovena Le. Cardiac cath November 2016 with widely patent stent in the LAD. Chronically occluded RCA with filling from left to right collaterals. There was a severe stenosis in the obtuse marginal branch treated with a drug eluting stent. Echo April 2018 with LVEF=25-30%. No significant valve disease. When he was seen by Dr. Bettina Gavia on 02/10/18 he complained of dyspnea with exertion and isolated episodes of chest pain. He was felt to be stable. His Ace-inh was changed to [facility]. He was angry when he found out the cost of Entresto and refused to go back to that office to be seen. I saw him in the office here at [facility] for the first time in February 2020 for pre-operative clearance prior to a colonoscopy and he was feeling well.   He is here today for follow up. The patient denies any chest pain, dyspnea, palpitations, lower extremity  edema, orthopnea, PND, dizziness, near syncope or syncope. Stable chronic chest pain with moderate to heavy exertion.      Primary Care Physician: Patient, No Pcp Per (Inactive)  Past Medical History:  Diagnosis Date   Adenocarcinoma of descending colon  pT3, pN1a, pMX s/p colectomy/ostomy 05/18/2016 05/26/2016   AICD (automatic cardioverter/defibrillator) present    09/07/13 Dr. Lovena Le post MI with EF 30% RV lead      Medtronic 6947 (serial number [id number]) Generator   Medtronic  Evera XT VR (serial Number [id number])    Anemia    Anemia due to blood loss 05/03/2016   Anxiety    Anxiety state 01/17/2015   Automatic implantable cardioverter-defibrillator in situ    MDT Aug 2015 Dr. Lovena Le   Benign neoplasm of cecum    CAD (coronary artery disease), native coronary artery 07/03/2013   Cath 06/20/13  Normal left main, occluded LAD, occluded RCA, 50% circ EF 15%  3.0 x28 and 3.0 x 8 mm Xience stent Dr. Tamala Julian  To LAD    Cancer [facility])    Cardiomyopathy, ischemic    Chronic anticoagulation 06/12/2017   Chronic kidney disease    Chronic systolic CHF (congestive heart failure) ([place])    ECHO 08/05/13  EF 30%  Anterior akinesis and inferior hypokinesis    COPD (chronic obstructive pulmonary disease) ([place])    Essential hypertension 05/16/2016   Financial difficulties 10/01/2013   History of colon cancer    Hyperlipidemia    Intra-abdominal abscess  06/12/2017   Iron deficiency anemia due to chronic blood loss 07/02/2016   Mass of colon 04/2016   Myocardial infarction [facility]) 04/29/2013   Old anterior myocardial infarction 06/20/2013   per pt 12/22/2014 had an MI   Partial bowel obstruction ([place]) 05/16/2016   S/P colostomy takedown 05/20/2017   Thrombocytopenia ([place]) 12/27/2014   Chronic     Tobacco use 07/03/2013    Past Surgical History:  Procedure Laterality Date   CARDIAC CATHETERIZATION  05/2013    CARDIAC CATHETERIZATION N/A 12/28/2014   Procedure: Left Heart Cath and Coronary Angiography;  Surgeon:  [name], MD;  Location: [place] CV LAB;  Service: Cardiovascular;  Laterality: N/A;   CARDIAC CATHETERIZATION N/A 12/28/2014   Procedure: Coronary Stent Intervention;  Surgeon: [name], MD;  Location: [place] CV LAB;  Service: Cardiovascular;  Laterality: N/A;   COLECTOMY WITH COLOSTOMY CREATION/HARTMANN PROCEDURE Left 05/18/2016   Procedure: COLECTOMY WITH OSTOMY CREATION/HARTMANN PROCEDURE;  Surgeon: [name], MD;  Location: [place];  Service: General;  Laterality: Left;   COLONOSCOPY     COLONOSCOPY WITH PROPOFOL N/A 04/01/2017   Procedure: COLONOSCOPY WITH PROPOFOL;  Surgeon: [name], MD;  [facility];  Service: Gastroenterology;  Laterality: N/A;   COLOSTOMY TAKEDOWN N/A 05/20/2017   Procedure: COLOSTOMY TAKEDOWN;  Surgeon: [name], MD;  Location: [place];  Service: General;  Laterality: N/A;   CORONARY ANGIOPLASTY  05/2013    FLEXIBLE SIGMOIDOSCOPY N/A 05/16/2016   Procedure: FLEXIBLE SIGMOIDOSCOPY;  Surgeon: [name], MD;  Location: [place];  Service: Gastroenterology;  Laterality: N/A;   HAND SURGERY Right    cyst removal   ICD placement  09/07/2013    IMPLANTABLE CARDIOVERTER DEFIBRILLATOR IMPLANT N/A 09/07/2013   Procedure: IMPLANTABLE CARDIOVERTER DEFIBRILLATOR IMPLANT;  Surgeon: [name], MD;  Location: [facility] CATH LAB;  Service: Cardiovascular;  Laterality: N/A;   INTRA-AORTIC BALLOON PUMP INSERTION  06/20/2013   Procedure: INTRA-AORTIC BALLOON PUMP INSERTION;  Surgeon: Sinclair Grooms, MD;  Location: [facility] CATH LAB;  Service: Cardiovascular;;   LEFT HEART CATHETERIZATION WITH CORONARY ANGIOGRAM N/A 06/20/2013   Procedure: LEFT HEART CATHETERIZATION WITH CORONARY ANGIOGRAM;  Surgeon: Sinclair Grooms, MD;  Location: [facility] CATH LAB;  Service: Cardiovascular;  Laterality: N/A;   PERCUTANEOUS CORONARY STENT INTERVENTION (PCI-S)  06/20/2013   Procedure: PERCUTANEOUS CORONARY STENT INTERVENTION (PCI-S);  Surgeon: Sinclair Grooms, MD;  Location: [facility] CATH LAB;  Service: Cardiovascular;;    Current Outpatient Medications  Medication Sig Dispense Refill   atorvastatin (LIPITOR) 80 MG tablet TAKE 1 TABLET BY MOUTH ONCE DAILY AT  6PM 90 tablet 3   carvedilol (COREG) 3.125 MG tablet Take 1 tablet (3.125 mg total) by mouth 2 (two) times daily with a meal. 180 tablet 0   clopidogrel (PLAVIX) 75 MG tablet Take 1 tablet (75 mg total) by mouth daily. 90 tablet 0   lisinopril (ZESTRIL) 2.5 MG tablet Take 1 tablet (2.5 mg total) by mouth daily. 90 tablet 3   Multiple Vitamin (MULTIVITAMIN WITH MINERALS) TABS tablet Take 1 tablet by mouth daily. 30 tablet 0   nitroGLYCERIN (NITROSTAT) 0.4 MG SL tablet Place 1 tablet (0.4 mg total) under the tongue every 5 (five) minutes as needed for chest pain. 25 tablet 6   PEG-KCl-NaCl-NaSulf-Na Asc-C (PLENVU) 140 g SOLR Take 1 kit by mouth as directed. 1 each 0   saccharomyces boulardii (FLORASTOR) 250 MG capsule Take 1 capsule (250 mg total) by mouth  2 (two) times daily. 30 capsule 0   digoxin (LANOXIN) 0.125 MG tablet Take 1 tablet (0.125 mg total) by mouth daily. 90 tablet 3   No current facility-administered medications for this visit.   Facility-Administered Medications Ordered in Other Visits  Medication Dose Route Frequency Provider Last Rate Last Admin   influenza vac split quadrivalent PF (FLUARIX) injection 0.5 mL  0.5 mL Intramuscular Once [name], MD        Allergies  Allergen Reactions   Capecitabine Other (See Comments)    Server dehydration and diarrhea       Celebrex [Celecoxib] Other (See Comments)    Caused his back to burn   Contrast Media [Iodinated Diagnostic Agents] Other (See Comments)    Patient feels weakness    Social History   Socioeconomic History   Marital status: Single    Spouse name: Not on file   Number of children: 0   Years of education: 12   Highest education level: Not on file  Occupational History   Occupation: Disabled  Tobacco  Use   Smoking status: Former    Packs/day: 2.00    Years: 40.00    Pack years: 80.00    Types: Cigarettes    Quit date: 12/22/2014    Years since quitting: 5.9   Smokeless tobacco: Never  Vaping Use   Vaping Use: Never used  Substance and Sexual Activity   Alcohol use: No    Comment: quit in 08/2013, he used to drink alcohol moderate (1 pine liquor one week)  for 30 years    Drug use: No   Sexual activity: Not Currently    Birth control/protection: Condom    Comment: men   Other Topics Concern   Not on file  Social History Narrative   Lives alone.    In a house trying to sell his home. Exercise: No.   Social Determinants of Health   Financial Resource Strain: Not on file  Food Insecurity: Not on file  Transportation Needs: Not on file  Physical Activity: Not on file  Stress: Not on file  Social Connections: Not on file  Intimate Partner Violence: Not on file    Family History  Problem Relation Age of Onset   Emphysema Father    Lung disease Father    Heart disease Mother    Dementia Mother    Diabetes Mother    CVA Mother    Alzheimer's disease Mother    Pancreatic cancer Maternal Grandmother    Lung cancer Maternal Grandmother    Cancer Neg Hx     Review of Systems:  As stated in the HPI and otherwise negative.   BP 108/64   Pulse 69   Ht 5' 7.5" (1.715 m)   Wt 177 lb (80.3 kg)   SpO2 98%   BMI 27.31 kg/m   Physical Examination:   Echo April 2018: - Left ventricle: The cavity size was mildly dilated. Wall   thickness was normal. Systolic function was severely reduced. The   estimated ejection fraction was in the range of 25% to 30%.   Akinesis of the anteroseptal, anterior, and apical myocardium.   Doppler parameters are consistent with abnormal left ventricular   relaxation (grade 1 diastolic dysfunction).  EKG:  EKG is not ordered today. The ekg ordered today demonstrates  Recent Labs: 04/13/2020: ALT 31; BUN 25; Creatinine, Ser 1.47;  Hemoglobin 14.6; Platelets 129; Potassium 4.9; Sodium 139   Lipid Panel    Component Value  Date/Time   CHOL 136 11/30/2019 0818   TRIG 75 11/30/2019 0818   HDL 41 11/30/2019 0818   CHOLHDL 3.3 11/30/2019 0818   CHOLHDL 3.9 12/23/2014 0331   VLDL 13 12/23/2014 0331   LDLCALC 80 11/30/2019 0818     Wt Readings from Last 3 Encounters:  12/14/20 177 lb (80.3 kg)  11/29/20 177 lb 3.2 oz (80.4 kg)  04/13/20 178 lb 8 oz (81 kg)    Assessment and Plan:   1. CAD with angina: No chest pain concerning for angina. Continue Plavix, statin and beta blocker.  Will stop ASA  2. Ischemic cardiomyopathy: LVEF=30% by echo in 2018. Weight is stable. No volume overload on exam. ICD in place and followed by Dr. Lovena Le. He has refused to consider Entresto due to cost. Will continue Digoxin, Coreg and Lisinopril. I have discussed setting up an echo today but he does not wish to do this.   3. HTN: BP is well controlled. No changes today  4. Hyperlipidemia: LDL near goal in November 2021. (LDL at 80 with goal of 70). Continue statin. Will check lipids today. LFTs normal in March 2022.   5. Former tobacco abuse  Current medicines are reviewed at length with the patient today.  The patient does not have concerns regarding medicines.  The following changes have been made:  no change  Follow up with me in 12 month.   Labs/ tests ordered today include:   Orders Placed This Encounter  Procedures   Lipid panel     Signed, Lauree Chandler, MD 12/14/2020 10:27 AM    [address], [place], [place]  [address] Phone: [phone]; Fax: [phone]

## 2020-12-14 ENCOUNTER — Encounter: Payer: Self-pay | Admitting: Cardiovascular Disease

## 2020-12-14 ENCOUNTER — Other Ambulatory Visit: Payer: Self-pay

## 2020-12-14 ENCOUNTER — Ambulatory Visit (INDEPENDENT_AMBULATORY_CARE_PROVIDER_SITE_OTHER): Payer: Medicare Other | Admitting: Cardiovascular Disease

## 2020-12-14 VITALS — BP 108/64 | HR 69 | Ht 67.5 in | Wt 177.0 lb

## 2020-12-14 DIAGNOSIS — I10 Essential (primary) hypertension: Secondary | ICD-10-CM

## 2020-12-14 DIAGNOSIS — I255 Ischemic cardiomyopathy: Secondary | ICD-10-CM | POA: Diagnosis not present

## 2020-12-14 DIAGNOSIS — E785 Hyperlipidemia, unspecified: Secondary | ICD-10-CM

## 2020-12-14 DIAGNOSIS — I2510 Atherosclerotic heart disease of native coronary artery without angina pectoris: Secondary | ICD-10-CM

## 2020-12-14 LAB — LIPID PANEL
Chol/HDL Ratio: 3.4 ratio (ref 0.0–5.0)
Cholesterol, Total: 117 mg/dL (ref 100–199)
HDL: 34 mg/dL — ABNORMAL LOW (ref >39)
LDL Chol Calc (NIH): 64 mg/dL (ref 0–99)
Triglycerides: 102 mg/dL (ref 0–149)
VLDL Cholesterol Cal: 19 mg/dL (ref 5–40)

## 2020-12-14 MED ORDER — DIGOXIN 125 MCG PO TABS: 0.1250 mg | ORAL_TABLET | Freq: Every day | ORAL | 3 refills | Status: DC

## 2020-12-14 NOTE — Patient Instructions (Signed)
Medication Instructions:  Discontinue Aspirin   *If you need a refill on your cardiac medications before your next appointment, please call your pharmacy*   Lab Work: Lipids- Today   If you have labs (blood work) drawn today and your tests are completely normal, you will receive your results only by: [place] (if you have MyChart) OR A paper copy in the mail If you have any lab test that is abnormal or we need to change your treatment, we will call you to review the results.   Testing/Procedures: None ordered    Follow-Up: At [facility], you and your health needs are our priority.  As part of our continuing mission to provide you with exceptional heart care, we have created designated Provider Care Teams.  These Care Teams include your primary Cardiologist (physician) and Advanced Practice Providers (APPs -  Physician Assistants and Nurse Practitioners) who all work together to provide you with the care you need, when you need it.  We recommend signing up for the patient portal called "MyChart".  Sign up information is provided on this After Visit Summary.  MyChart is used to connect with patients for Virtual Visits (Telemedicine).  Patients are able to view lab/test results, encounter notes, upcoming appointments, etc.  Non-urgent messages can be sent to your provider as well.   To learn more about what you can do with MyChart, go to [email or web address].    Your next appointment:   12 month(s)  The format for your next appointment:   In Person  Provider:   Lauree Chandler, MD {    Other Instructions None

## 2020-12-27 ENCOUNTER — Ambulatory Visit (INDEPENDENT_AMBULATORY_CARE_PROVIDER_SITE_OTHER): Payer: Medicare Other

## 2020-12-27 DIAGNOSIS — I255 Ischemic cardiomyopathy: Secondary | ICD-10-CM

## 2020-12-27 DIAGNOSIS — Z95810 Presence of automatic (implantable) cardiac defibrillator: Secondary | ICD-10-CM

## 2020-12-27 LAB — CUP PACEART REMOTE DEVICE CHECK
Battery Remaining Longevity: 53 mo
Battery Voltage: 2.95 V
Brady Statistic RV Percent Paced: 0.01 %
Date Time Interrogation Session: 20221129024151
HighPow Impedance: 71 Ohm
Implantable Lead Implant Date: 20150810
Implantable Lead Location: 753860
Implantable Lead Model: 6935
Implantable Pulse Generator Implant Date: 20150810
Lead Channel Impedance Value: 399 Ohm
Lead Channel Impedance Value: 475 Ohm
Lead Channel Pacing Threshold Amplitude: 0.625 V
Lead Channel Pacing Threshold Pulse Width: 0.4 ms
Lead Channel Sensing Intrinsic Amplitude: 7.125 mV
Lead Channel Sensing Intrinsic Amplitude: 7.125 mV
Lead Channel Setting Pacing Amplitude: 2 V
Lead Channel Setting Pacing Pulse Width: 0.4 ms
Lead Channel Setting Sensing Sensitivity: 0.3 mV

## 2021-01-04 NOTE — Progress Notes (Signed)
Remote ICD transmission.   

## 2021-01-19 ENCOUNTER — Encounter: Payer: Self-pay | Admitting: Cardiovascular Disease

## 2021-01-19 MED ORDER — DIGOXIN 125 MCG PO TABS: 0.1250 mg | ORAL_TABLET | Freq: Every day | ORAL | 3 refills | Status: DC

## 2021-02-06 ENCOUNTER — Encounter: Payer: Self-pay | Admitting: Cardiovascular Disease

## 2021-02-06 MED ORDER — ATORVASTATIN CALCIUM 80 MG PO TABS: ORAL_TABLET | ORAL | 3 refills | Status: DC

## 2021-02-06 MED ORDER — CLOPIDOGREL BISULFATE 75 MG PO TABS: 75.0000 mg | ORAL_TABLET | Freq: Every day | ORAL | 3 refills | Status: DC

## 2021-03-16 ENCOUNTER — Encounter: Payer: Self-pay | Admitting: Cardiovascular Disease

## 2021-03-17 MED ORDER — CARVEDILOL 3.125 MG PO TABS: 3.1250 mg | ORAL_TABLET | Freq: Two times a day (BID) | ORAL | 3 refills | Status: DC

## 2021-03-28 ENCOUNTER — Ambulatory Visit (INDEPENDENT_AMBULATORY_CARE_PROVIDER_SITE_OTHER): Payer: Medicare Other

## 2021-03-28 DIAGNOSIS — I255 Ischemic cardiomyopathy: Secondary | ICD-10-CM | POA: Diagnosis not present

## 2021-03-28 LAB — CUP PACEART REMOTE DEVICE CHECK
Battery Remaining Longevity: 51 mo
Battery Voltage: 2.94 V
Brady Statistic RV Percent Paced: 0 %
Date Time Interrogation Session: 20230228024614
HighPow Impedance: 68 Ohm
Implantable Lead Implant Date: 20150810
Implantable Lead Location: 753860
Implantable Lead Model: 6935
Implantable Pulse Generator Implant Date: 20150810
Lead Channel Impedance Value: 342 Ohm
Lead Channel Impedance Value: 418 Ohm
Lead Channel Pacing Threshold Amplitude: 0.5 V
Lead Channel Pacing Threshold Pulse Width: 0.4 ms
Lead Channel Sensing Intrinsic Amplitude: 8.125 mV
Lead Channel Sensing Intrinsic Amplitude: 8.125 mV
Lead Channel Setting Pacing Amplitude: 2 V
Lead Channel Setting Pacing Pulse Width: 0.4 ms
Lead Channel Setting Sensing Sensitivity: 0.3 mV

## 2021-04-04 NOTE — Progress Notes (Signed)
Remote ICD transmission.   

## 2021-04-13 ENCOUNTER — Inpatient Hospital Stay: Payer: Medicare Other

## 2021-04-13 ENCOUNTER — Encounter: Payer: Self-pay | Admitting: Hematology

## 2021-04-13 ENCOUNTER — Other Ambulatory Visit: Payer: Self-pay

## 2021-04-13 ENCOUNTER — Inpatient Hospital Stay: Payer: Medicare Other | Attending: Hematology | Admitting: Hematology

## 2021-04-13 VITALS — BP 128/90 | HR 76 | Temp 98.5°F | Resp 18 | Ht 67.5 in | Wt 173.7 lb

## 2021-04-13 DIAGNOSIS — C19 Malignant neoplasm of rectosigmoid junction: Secondary | ICD-10-CM | POA: Insufficient documentation

## 2021-04-13 DIAGNOSIS — Z7902 Long term (current) use of antithrombotics/antiplatelets: Secondary | ICD-10-CM | POA: Insufficient documentation

## 2021-04-13 DIAGNOSIS — N189 Chronic kidney disease, unspecified: Secondary | ICD-10-CM | POA: Diagnosis not present

## 2021-04-13 DIAGNOSIS — C189 Malignant neoplasm of colon, unspecified: Secondary | ICD-10-CM

## 2021-04-13 DIAGNOSIS — Z87891 Personal history of nicotine dependence: Secondary | ICD-10-CM | POA: Diagnosis not present

## 2021-04-13 DIAGNOSIS — Z79899 Other long term (current) drug therapy: Secondary | ICD-10-CM | POA: Insufficient documentation

## 2021-04-13 DIAGNOSIS — I2510 Atherosclerotic heart disease of native coronary artery without angina pectoris: Secondary | ICD-10-CM | POA: Diagnosis not present

## 2021-04-13 DIAGNOSIS — I5022 Chronic systolic (congestive) heart failure: Secondary | ICD-10-CM | POA: Diagnosis not present

## 2021-04-13 DIAGNOSIS — I255 Ischemic cardiomyopathy: Secondary | ICD-10-CM | POA: Insufficient documentation

## 2021-04-13 DIAGNOSIS — R109 Unspecified abdominal pain: Secondary | ICD-10-CM | POA: Insufficient documentation

## 2021-04-13 DIAGNOSIS — D696 Thrombocytopenia, unspecified: Secondary | ICD-10-CM | POA: Insufficient documentation

## 2021-04-13 DIAGNOSIS — I252 Old myocardial infarction: Secondary | ICD-10-CM | POA: Insufficient documentation

## 2021-04-13 DIAGNOSIS — M47816 Spondylosis without myelopathy or radiculopathy, lumbar region: Secondary | ICD-10-CM | POA: Diagnosis not present

## 2021-04-13 DIAGNOSIS — Z9049 Acquired absence of other specified parts of digestive tract: Secondary | ICD-10-CM | POA: Diagnosis not present

## 2021-04-13 DIAGNOSIS — M5136 Other intervertebral disc degeneration, lumbar region: Secondary | ICD-10-CM | POA: Diagnosis not present

## 2021-04-13 DIAGNOSIS — I700 Atherosclerosis of aorta: Secondary | ICD-10-CM | POA: Insufficient documentation

## 2021-04-13 DIAGNOSIS — J439 Emphysema, unspecified: Secondary | ICD-10-CM | POA: Diagnosis not present

## 2021-04-13 DIAGNOSIS — Z886 Allergy status to analgesic agent status: Secondary | ICD-10-CM | POA: Insufficient documentation

## 2021-04-13 DIAGNOSIS — E785 Hyperlipidemia, unspecified: Secondary | ICD-10-CM | POA: Insufficient documentation

## 2021-04-13 DIAGNOSIS — Z7901 Long term (current) use of anticoagulants: Secondary | ICD-10-CM | POA: Insufficient documentation

## 2021-04-13 DIAGNOSIS — Z86010 Personal history of colonic polyps: Secondary | ICD-10-CM | POA: Diagnosis not present

## 2021-04-13 DIAGNOSIS — C186 Malignant neoplasm of descending colon: Secondary | ICD-10-CM

## 2021-04-13 DIAGNOSIS — I130 Hypertensive heart and chronic kidney disease with heart failure and stage 1 through stage 4 chronic kidney disease, or unspecified chronic kidney disease: Secondary | ICD-10-CM | POA: Diagnosis not present

## 2021-04-13 LAB — COMPREHENSIVE METABOLIC PANEL
ALT: 22 U/L (ref 0–44)
AST: 21 U/L (ref 15–41)
Albumin: 4.3 g/dL (ref 3.5–5.0)
Alkaline Phosphatase: 51 U/L (ref 38–126)
Anion gap: 5 (ref 5–15)
BUN: 27 mg/dL — ABNORMAL HIGH (ref 6–20)
CO2: 30 mmol/L (ref 22–32)
Calcium: 9.9 mg/dL (ref 8.9–10.3)
Chloride: 105 mmol/L (ref 98–111)
Creatinine, Ser: 1.33 mg/dL — ABNORMAL HIGH (ref 0.61–1.24)
GFR, Estimated: >60 mL/min (ref >60)
Glucose, Bld: 101 mg/dL — ABNORMAL HIGH (ref 70–99)
Potassium: 4.3 mmol/L (ref 3.5–5.1)
Sodium: 140 mmol/L (ref 135–145)
Total Bilirubin: 0.4 mg/dL (ref 0.3–1.2)
Total Protein: 7.2 g/dL (ref 6.5–8.1)

## 2021-04-13 LAB — CBC WITH DIFFERENTIAL/PLATELET
Abs Immature Granulocytes: 0.01 10*3/uL (ref 0.00–0.07)
Basophils Absolute: 0 10*3/uL (ref 0.0–0.1)
Basophils Relative: 0 %
Eosinophils Absolute: 0.2 10*3/uL (ref 0.0–0.5)
Eosinophils Relative: 2 %
HCT: 40.7 % (ref 39.0–52.0)
Hemoglobin: 13.7 g/dL (ref 13.0–17.0)
Immature Granulocytes: 0 %
Lymphocytes Relative: 35 %
Lymphs Abs: 2.7 10*3/uL (ref 0.7–4.0)
MCH: 31 pg (ref 26.0–34.0)
MCHC: 33.7 g/dL (ref 30.0–36.0)
MCV: 92.1 fL (ref 80.0–100.0)
Monocytes Absolute: 0.7 10*3/uL (ref 0.1–1.0)
Monocytes Relative: 9 %
Neutro Abs: 4.2 10*3/uL (ref 1.7–7.7)
Neutrophils Relative %: 54 %
Platelets: 130 10*3/uL — ABNORMAL LOW (ref 150–400)
RBC: 4.42 MIL/uL (ref 4.22–5.81)
RDW: 13.8 % (ref 11.5–15.5)
WBC: 7.8 10*3/uL (ref 4.0–10.5)
nRBC: 0 % (ref 0.0–0.2)

## 2021-04-13 LAB — CEA (IN HOUSE-CHCC): CEA (CHCC-In House): 5.07 ng/mL — ABNORMAL HIGH (ref 0.00–5.00)

## 2021-04-13 NOTE — Progress Notes (Signed)
?Christopher [name]   ?[phone]) [phone] [phone]   ?Clinic Follow up Note  ? ?Patient Care Team: ?Patient, No Pcp Per (Inactive) as PCP - General (General Practice) ?Burnell Blanks, MD as PCP - Cardiology (Cardiology) ?[name], MD as Consulting Physician (Cardiology) ?[name], MD as Consulting Physician (Cardiology) ?[name], MD as Consulting Physician (Hematology) ?[name], MD as Consulting Physician (General Surgery) ? ?Date of Service:  04/13/2021 ? ?CHIEF COMPLAINT: f/u of colon cancer ? ?CURRENT THERAPY:  ?Surveillance ? ?ASSESSMENT & PLAN:  ?Christopher [name] is a 61 y.o. male with  ? ?1. Invasive Colorectal Adenocarcinoma of the Left Colon, pT3N1aM0,  Stage IIIB, MSI-stable  ?-Diagnosed in 04/2016. Treated with left hemicolectomy and adjuvant chemo. He is currently on surveillance. ?-Per pt he previously had a bad reaction to CT iv contrast. Will avoid in the future.  ?-His last colonoscopy was in 03/2017 and had 1 small benign polyp removed. He was supposed to have repeated colonoscopy one year after but has not been able to do due to lack of person who can go with him for the procedure. He states his family refuse to help him and do not treat him well in general. I discussed he could do either a CT colonography or a Cologuard screening. While these aren't standard and would likely lead to a colonoscopy anyway, I encouraged him to do something to f/u. ?-He is clinically doing well. Labs reviewed, CBC and CMP WNL except stable and mild thrombocytopenia. CEA is still pending. Physical exam unremarkable. There is no clinical concern for recurrence.  ?-He is almost 5 years since his cancer diagnosis. I am comfortable releasing him from f/u. I encouraged him to find a new PCP to follow with.  ?  ?2. CAD and systolic CHF with EF 36-14%, COPD, CKD ?-He previously quit smoking  ?-s/p 2 stent placements and a pace maker. ?-On Plavix  ?-Continue to f/u with  cardiology ?-He has CKD since 2020. His Cr is overall stable at 1.33 today (04/13/21) ?  ?  ?Plan  ?-f/u open ?-I encouraged him to find a new PCP  ? ? ?No problem-specific Assessment & Plan notes found for this encounter. ? ? ?SUMMARY OF ONCOLOGIC HISTORY: ?Oncology History Overview Note  ?Cancer Staging ?Adenocarcinoma of descending colon  pT3, pN1a, pMX s/p colectomy/ostomy 05/18/2016 ?Staging form: Colon and Rectum, AJCC 8th Edition ?- Pathologic stage from 05/18/2016: Stage IIIB (pT3, pN1a, cM0) - Signed by [name], MD on 07/01/2016 ? ? ?  ?Adenocarcinoma of descending colon  pT3, pN1a, pMX s/p colectomy/ostomy 05/18/2016  ?05/09/2016 Imaging  ? CT chest, abdomen and pelvis showed probable descending colon carcinoma. No evidence of distant metastasis. 7 cm hypervascular focus in the upper liver, not typical for metastatic disease. ?  ?05/16/2016 Procedure  ? Colonoscopy showed a large mass which completely obstructss the distal descending colon. Biopsied. Diverticulosis in the left colon. ?  ?05/18/2016 Surgery  ? Left hemicolectomy by Dr. [name] ?  ?05/18/2016 Pathology Results  ? Left colon segmental resection shows invasive colorectal adenocarcinoma, 4.6 cm, tumor extends into subserosa connective tissue, margins are negative, metastatic carcinoma in 1 of 12 lymph nodes. Grade 2, lymphovascular invasion present, perineural invasion negative. ?  ?05/18/2016 Miscellaneous  ? MSI-stable  ?  ?05/18/2016 Initial Diagnosis  ? Adenocarcinoma of descending colon  pT3, pN1a, pMX s/p colectomy/ostomy 05/18/2016 ?  ?07/16/2016 - 08/22/2016 Chemotherapy  ? Xeloda twice a day for 2 weeks on and 1  week off.  ?stopped after 3 days due to severe diarrhea, and changed to 1526m bid, started on 07/31/2016; Hold Xeloda starting 08/22/16 due to AKI. Will lower Xeloda to 3 in the morning and 2 in the evening when restart next cycle.   ?Stopped due to poor tolerance of sever diarrhea.  ? ?  ?01/14/2017 Imaging  ? CT CAP W Contrast 01/14/17   ?IMPRESSION: ?1. Stable exam. No new or progressive findings in the chest, ?abdomen, or pelvis. The tiny hypervascular focus in the dome of the ?liver seen on the previous study is not evident on today's exam. No ?evidence for metastatic disease on today's CT scan. ?2.  Aortic Atherosclerois (ICD10-170.0) ?3. Distal transverse end colostomy. ?4.  Emphysema. (ICD10-J43.9) ?  ?04/01/2017 Procedure  ? 04/01/2017 ?- One 6 mm polyp in the cecum, removed with a cold snare. Resected and retrieved. ?- Diverticulosis in the left colon. ?  ?04/01/2017 Pathology Results  ? Colon, polyp(s), cecal cold snare ?- TUBULAR ADENOMA (ONE FRAGMENT). ?- NO HIGH GRADE DYSPLASIA OR MALIGNANCY. ?  ?05/20/2017 Surgery  ? Surgery:05/20/2017 ?COLOSTOMY TAKEDOWN ?BGeorganna SkeansMD ? ?  ?05/20/2017 Pathology Results  ? Pathology: 05/20/2017 ?Stoma ?- FINDINGS CONSISTENT WITH OSTOMY. ?- NO DYSPLASIA OR MALIGNANCY. ? ?  ?06/02/2017 Imaging  ? 06/02/2017 CT AP ?IMPRESSION: ?1. Postsurgical wounds in the anterior abdominal wall with skin ?staples and edema. No dehiscence or discrete fluid collection. ?2. Partial colectomy with anastomosis in left hemiabdomen. Mild ?edema surrounding site of anastomosis, probably postsurgical ?changes. ?3. No abscess, bowel wall thickening, or obstruction identified. ?  ?06/12/2017 Imaging  ? 06/12/2017 CT AP ?IMPRESSION: ?Improving appearance to what may have represented a focal ?anastomotic leak posterior to the left colon at the level of ?reanastomosis after colostomy takedown. An area of probable ?extraluminal air and fluid is smaller and contains less extraluminal ?air. This is likely consistent with resolving abscess. Some residual ?inflammation remains in this region. No associated bowel obstruction ?or free air. ?  ?06/09/2018 Imaging  ? CT CAP WO contrast  ?IMPRESSION: ?Stable exam. No evidence of recurrent or metastatic carcinoma within ?the chest, abdomen, or pelvis. ?  ?Aortic Atherosclerosis (ICD10-I70.0). Coronary  artery ?atherosclerosis. ?  ?07/09/2019 Imaging  ? CT CAP  ?IMPRESSION: ?1. No findings of recurrent malignancy. ?2. Other imaging findings of potential clinical significance: ?Coronary atherosclerosis with left ventricular predominance. ?Cardiomegaly with left ventricular predominance. Fat density in the ?left ventricular myocardium along the anterior wall, septum, and ?apex compatible with prior myocardial infarction. Sigmoid colon ?diverticulosis. Lower lumbar spondylosis and degenerative disc ?disease. ?3. Aortic atherosclerosis. ?  ?Aortic Atherosclerosis (ICD10-I70.0). ?  ? ? ? ?INTERVAL HISTORY:  ?HDORRANCE SELLICKis here for a follow up of colon cancer. He was last seen by me a year ago. He presents to the clinic alone. ?He reports he experienced some abdominal pain/discomfort when taking medicine on an empty stomach. He notes this is new within the last year. He denies any other stomach pains, such as with eating, as he did previously. ?  ?All other systems were reviewed with the patient and are negative. ? ?MEDICAL HISTORY:  ?Past Medical History:  ?Diagnosis Date  ? Adenocarcinoma of descending colon  pT3, pN1a, pMX s/p colectomy/ostomy 05/18/2016 05/26/2016  ? AICD (automatic cardioverter/defibrillator) present   ? 09/07/13 Dr. TLovena Lepost MI with EF 30% RV lead      Medtronic 6947 (serial number [id number]) Generator   Medtronic  Evera XT VR (serial Number [id number]   ? Anemia   ?  Anemia due to blood loss 05/03/2016  ? Anxiety   ? Anxiety state 01/17/2015  ? Automatic implantable cardioverter-defibrillator in situ   ? MDT Aug 2015 Dr. Lovena Le  ? Benign neoplasm of cecum   ? CAD (coronary artery disease), native coronary artery 07/03/2013  ? Cath 06/20/13  Normal left main, occluded LAD, occluded RCA, 50% circ EF 15%  3.0 x28 and 3.0 x 8 mm Xience stent Dr. Tamala Julian  To LAD   ? Cancer [facility])   ? Cardiomyopathy, ischemic   ? Chronic anticoagulation 06/12/2017  ? Chronic kidney disease   ? Chronic systolic CHF (congestive  heart failure) ([place])   ? ECHO 08/05/13  EF 30%  Anterior akinesis and inferior hypokinesis   ? COPD (chronic obstructive pulmonary disease) ([place])   ? Essential hypertension 05/16/2016  ? Financial difficulties

## 2021-04-14 ENCOUNTER — Encounter: Payer: Self-pay | Admitting: Cardiovascular Disease

## 2021-04-14 MED ORDER — LISINOPRIL 2.5 MG PO TABS: 2.5000 mg | ORAL_TABLET | Freq: Every day | ORAL | 2 refills | Status: DC

## 2021-06-21 ENCOUNTER — Encounter: Payer: Self-pay | Admitting: Cardiovascular Disease

## 2021-06-21 MED ORDER — NITROGLYCERIN 0.4 MG SL SUBL: 0.4000 mg | SUBLINGUAL_TABLET | SUBLINGUAL | 6 refills | Status: DC | PRN

## 2021-06-27 ENCOUNTER — Ambulatory Visit (INDEPENDENT_AMBULATORY_CARE_PROVIDER_SITE_OTHER): Payer: Medicare Other

## 2021-06-27 DIAGNOSIS — I255 Ischemic cardiomyopathy: Secondary | ICD-10-CM | POA: Diagnosis not present

## 2021-06-27 LAB — CUP PACEART REMOTE DEVICE CHECK
Battery Remaining Longevity: 46 mo
Battery Voltage: 2.93 V
Brady Statistic RV Percent Paced: 0.01 %
Date Time Interrogation Session: 20230530001056
HighPow Impedance: 72 Ohm
Implantable Lead Implant Date: 20150810
Implantable Lead Location: 753860
Implantable Lead Model: 6935
Implantable Pulse Generator Implant Date: 20150810
Lead Channel Impedance Value: 399 Ohm
Lead Channel Impedance Value: 456 Ohm
Lead Channel Pacing Threshold Amplitude: 0.625 V
Lead Channel Pacing Threshold Pulse Width: 0.4 ms
Lead Channel Sensing Intrinsic Amplitude: 7.875 mV
Lead Channel Sensing Intrinsic Amplitude: 7.875 mV
Lead Channel Setting Pacing Amplitude: 2 V
Lead Channel Setting Pacing Pulse Width: 0.4 ms
Lead Channel Setting Sensing Sensitivity: 0.3 mV

## 2021-06-29 LAB — CUP PACEART REMOTE DEVICE CHECK
Battery Remaining Longevity: 46 mo
Battery Voltage: 2.93 V
Brady Statistic RV Percent Paced: 0.01 %
Date Time Interrogation Session: 20230529001604
HighPow Impedance: 72 Ohm
Implantable Lead Implant Date: 20150810
Implantable Lead Location: 753860
Implantable Lead Model: 6935
Implantable Pulse Generator Implant Date: 20150810
Lead Channel Impedance Value: 361 Ohm
Lead Channel Impedance Value: 456 Ohm
Lead Channel Pacing Threshold Amplitude: 0.5 V
Lead Channel Pacing Threshold Pulse Width: 0.4 ms
Lead Channel Sensing Intrinsic Amplitude: 8.375 mV
Lead Channel Sensing Intrinsic Amplitude: 8.375 mV
Lead Channel Setting Pacing Amplitude: 2 V
Lead Channel Setting Pacing Pulse Width: 0.4 ms
Lead Channel Setting Sensing Sensitivity: 0.3 mV

## 2021-07-07 NOTE — Progress Notes (Signed)
Remote ICD transmission.   

## 2021-09-26 ENCOUNTER — Ambulatory Visit (INDEPENDENT_AMBULATORY_CARE_PROVIDER_SITE_OTHER): Payer: Medicare Other

## 2021-09-26 DIAGNOSIS — I255 Ischemic cardiomyopathy: Secondary | ICD-10-CM

## 2021-09-26 LAB — CUP PACEART REMOTE DEVICE CHECK
Battery Remaining Longevity: 44 mo
Battery Voltage: 2.93 V
Brady Statistic RV Percent Paced: 0 %
Date Time Interrogation Session: 20230829003325
HighPow Impedance: 66 Ohm
Implantable Lead Implant Date: 20150810
Implantable Lead Location: 753860
Implantable Lead Model: 6935
Implantable Pulse Generator Implant Date: 20150810
Lead Channel Impedance Value: 342 Ohm
Lead Channel Impedance Value: 418 Ohm
Lead Channel Pacing Threshold Amplitude: 0.5 V
Lead Channel Pacing Threshold Pulse Width: 0.4 ms
Lead Channel Sensing Intrinsic Amplitude: 7 mV
Lead Channel Sensing Intrinsic Amplitude: 7 mV
Lead Channel Setting Pacing Amplitude: 2 V
Lead Channel Setting Pacing Pulse Width: 0.4 ms
Lead Channel Setting Sensing Sensitivity: 0.3 mV

## 2021-10-03 ENCOUNTER — Encounter: Payer: Self-pay | Admitting: Internal Medicine

## 2021-10-20 NOTE — Progress Notes (Signed)
Remote ICD transmission.   

## 2021-11-09 DIAGNOSIS — Z23 Encounter for immunization: Secondary | ICD-10-CM | POA: Diagnosis not present

## 2021-11-29 NOTE — Progress Notes (Signed)
Electrophysiology Office Note Date: 12/04/2021  ID:  Christopher [name], DOB March 21, 1960, MRN [id number]  PCP: Patient, No Pcp Per Primary Cardiologist: Lauree Chandler, MD Electrophysiologist: [name], MD   CC: Routine ICD follow-up  Christopher [name] is a 61 y.o. male seen today for [name], MD for routine electrophysiology followup. Since last being seen in our clinic the patient reports doing well overall.  he denies chest pain, palpitations, dyspnea, PND, orthopnea, nausea, vomiting, dizziness, syncope, edema, weight gain, or early satiety.   She has not had ICD shocks.   Device History: Medtronic Single Chamber ICD implanted 2015 for CHF  Past Medical History:  Diagnosis Date   Adenocarcinoma of descending colon  pT3, pN1a, pMX s/p colectomy/ostomy 05/18/2016 05/26/2016   AICD (automatic cardioverter/defibrillator) present    09/07/13 Dr. Lovena Le post MI with EF 30% RV lead      Medtronic 6947 (serial number [id number]) Generator   Medtronic  Evera XT VR (serial Number [id number])    Anemia    Anemia due to blood loss 05/03/2016   Anxiety    Anxiety state 01/17/2015   Automatic implantable cardioverter-defibrillator in situ    MDT Aug 2015 Dr. Lovena Le   Benign neoplasm of cecum    CAD (coronary artery disease), native coronary artery 07/03/2013   Cath 06/20/13  Normal left main, occluded LAD, occluded RCA, 50% circ EF 15%  3.0 x28 and 3.0 x 8 mm Xience stent Dr. Tamala Julian  To LAD    Cancer [facility])    Cardiomyopathy, ischemic    Chronic anticoagulation 06/12/2017   Chronic kidney disease    Chronic systolic CHF (congestive heart failure) ([place])    ECHO 08/05/13  EF 30%  Anterior akinesis and inferior hypokinesis    COPD (chronic obstructive pulmonary disease) ([place])    Essential hypertension 05/16/2016   Financial difficulties 10/01/2013   History of colon cancer    Hyperlipidemia    Intra-abdominal abscess 06/12/2017   Iron deficiency anemia due to chronic blood loss 07/02/2016    Mass of colon 04/2016   Myocardial infarction [facility]) 04/29/2013   Old anterior myocardial infarction 06/20/2013   per pt 12/22/2014 had an MI   Partial bowel obstruction ([place]) 05/16/2016   S/P colostomy takedown 05/20/2017   Thrombocytopenia ([place]) 12/27/2014   Chronic     Tobacco use 07/03/2013   Past Surgical History:  Procedure Laterality Date   CARDIAC CATHETERIZATION  05/2013    CARDIAC CATHETERIZATION N/A 12/28/2014   Procedure: Left Heart Cath and Coronary Angiography;  Surgeon: [name], MD;  Location: [place] CV LAB;  Service: Cardiovascular;  Laterality: N/A;   CARDIAC CATHETERIZATION N/A 12/28/2014   Procedure: Coronary Stent Intervention;  Surgeon: [name], MD;  Location: [place] CV LAB;  Service: Cardiovascular;  Laterality: N/A;   COLECTOMY WITH COLOSTOMY CREATION/HARTMANN PROCEDURE Left 05/18/2016   Procedure: COLECTOMY WITH OSTOMY CREATION/HARTMANN PROCEDURE;  Surgeon: [name], MD;  Location: [place];  Service: General;  Laterality: Left;   COLONOSCOPY     COLONOSCOPY WITH PROPOFOL N/A 04/01/2017   Procedure: COLONOSCOPY WITH PROPOFOL;  Surgeon: [name], MD;  Location: [facility] ENDOSCOPY;  Service: Gastroenterology;  Laterality: N/A;   COLOSTOMY TAKEDOWN N/A 05/20/2017   Procedure: COLOSTOMY TAKEDOWN;  Surgeon: [name], MD;  Location: [place];  Service: General;  Laterality: N/A;   CORONARY ANGIOPLASTY  05/2013    FLEXIBLE SIGMOIDOSCOPY N/A 05/16/2016   Procedure: FLEXIBLE SIGMOIDOSCOPY;  Surgeon: [name], MD;  Location: [facility] ENDOSCOPY;  Service: Gastroenterology;  Laterality: N/A;   HAND SURGERY Right    cyst removal   ICD placement  09/07/2013    IMPLANTABLE CARDIOVERTER DEFIBRILLATOR IMPLANT N/A 09/07/2013   Procedure: IMPLANTABLE CARDIOVERTER DEFIBRILLATOR IMPLANT;  Surgeon: [name], MD;  Location: [facility] CATH LAB;  Service: Cardiovascular;  Laterality: N/A;   INTRA-AORTIC BALLOON PUMP INSERTION  06/20/2013   Procedure: INTRA-AORTIC  BALLOON PUMP INSERTION;  Surgeon: Sinclair Grooms, MD;  Location: [facility] CATH LAB;  Service: Cardiovascular;;   LEFT HEART CATHETERIZATION WITH CORONARY ANGIOGRAM N/A 06/20/2013   Procedure: LEFT HEART CATHETERIZATION WITH CORONARY ANGIOGRAM;  Surgeon: Sinclair Grooms, MD;  Location: [facility] CATH LAB;  Service: Cardiovascular;  Laterality: N/A;   PERCUTANEOUS CORONARY STENT INTERVENTION (PCI-S)  06/20/2013   Procedure: PERCUTANEOUS CORONARY STENT INTERVENTION (PCI-S);  Surgeon: Sinclair Grooms, MD;  Location: [facility] CATH LAB;  Service: Cardiovascular;;    Current Outpatient Medications  Medication Sig Dispense Refill   atorvastatin (LIPITOR) 80 MG tablet TAKE 1 TABLET BY MOUTH ONCE DAILY AT  6PM 90 tablet 3   carvedilol (COREG) 3.125 MG tablet Take 1 tablet (3.125 mg total) by mouth 2 (two) times daily with a meal. 180 tablet 3   clopidogrel (PLAVIX) 75 MG tablet Take 1 tablet (75 mg total) by mouth daily. 90 tablet 3   digoxin (LANOXIN) 0.125 MG tablet Take 1 tablet (0.125 mg total) by mouth daily. 90 tablet 3   lisinopril (ZESTRIL) 2.5 MG tablet Take 1 tablet (2.5 mg total) by mouth daily. 90 tablet 2   Multiple Vitamin (MULTIVITAMIN WITH MINERALS) TABS tablet Take 1 tablet by mouth daily. 30 tablet 0   nitroGLYCERIN (NITROSTAT) 0.4 MG SL tablet Place 1 tablet (0.4 mg total) under the tongue every 5 (five) minutes as needed for chest pain. 25 tablet 6   saccharomyces boulardii (FLORASTOR) 250 MG capsule Take 1 capsule (250 mg total) by mouth 2 (two) times daily. 30 capsule 0   No current facility-administered medications for this visit.   Facility-Administered Medications Ordered in Other Visits  Medication Dose Route Frequency Provider Last Rate Last Admin   influenza vac split quadrivalent PF (FLUARIX) injection 0.5 mL  0.5 mL Intramuscular Once [name], MD        Allergies:   Capecitabine, Celebrex [celecoxib], and Contrast media [iodinated contrast media]   Social History: Social History    Socioeconomic History   Marital status: Single    Spouse name: Not on file   Number of children: 0   Years of education: 12   Highest education level: Not on file  Occupational History   Occupation: Disabled  Tobacco Use   Smoking status: Former    Packs/day: 2.00    Years: 40.00    Total pack years: 80.00    Types: Cigarettes    Quit date: 12/22/2014    Years since quitting: 6.9   Smokeless tobacco: Never  Vaping Use   Vaping Use: Never used  Substance and Sexual Activity   Alcohol use: No    Comment: quit in 08/2013, he used to drink alcohol moderate (1 pine liquor one week)  for 30 years    Drug use: No   Sexual activity: Not Currently    Birth control/protection: Condom    Comment: men   Other Topics Concern   Not on file  Social History Narrative   Lives alone.    In a house trying to sell his home. Exercise: No.  Social Determinants of Health   Financial Resource Strain: Not on file  Food Insecurity: Not on file  Transportation Needs: Not on file  Physical Activity: Not on file  Stress: Not on file  Social Connections: Not on file  Intimate Partner Violence: Not on file    Family History: Family History  Problem Relation Age of Onset   Emphysema Father    Lung disease Father    Heart disease Mother    Dementia Mother    Diabetes Mother    CVA Mother    Alzheimer's disease Mother    Pancreatic cancer Maternal Grandmother    Lung cancer Maternal Grandmother    Cancer Neg Hx     Review of Systems: All other systems reviewed and are otherwise negative except as noted above.   Physical Exam: Vitals:   12/04/21 0952  BP: 114/78  Pulse: 75  SpO2: 98%  Weight: 174 lb 6.4 oz (79.1 kg)  Height: 5' 7.5" (1.715 m)     GEN- The patient is well appearing, alert and oriented x 3 today.   HEENT: normocephalic, atraumatic; sclera clear, conjunctiva pink; hearing intact; oropharynx clear; neck supple, no JVP Lymph- no cervical lymphadenopathy Lungs-  Clear to ausculation bilaterally, normal work of breathing.  No wheezes, rales, rhonchi Heart- Regular  rate and rhythm, no murmurs, rubs or gallops, PMI not laterally displaced GI- soft, non-tender, non-distended, bowel sounds present, no hepatosplenomegaly Extremities- no clubbing or cyanosis. No peripheral edema; DP/PT/radial pulses 2+ bilaterally MS- no significant deformity or atrophy Skin- warm and dry, no rash or lesion; ICD pocket well healed Psych- euthymic mood, full affect Neuro- strength and sensation are intact  ICD interrogation- reviewed in detail today,  See PACEART report  EKG:  EKG is ordered today. Personal review of EKG ordered today shows NSR, LBBB  Recent Labs: 04/13/2021: ALT 22; BUN 27; Creatinine, Ser 1.33; Hemoglobin 13.7; Platelets 130; Potassium 4.3; Sodium 140   Wt Readings from Last 3 Encounters:  12/04/21 174 lb 6.4 oz (79.1 kg)  04/13/21 173 lb 11.2 oz (78.8 kg)  12/14/20 177 lb (80.3 kg)     Other studies Reviewed: Additional studies/ records that were reviewed today include: Previous EP office notes.   Assessment and Plan:  1.  Chronic systolic dysfunction s/p Medtronic single chamber ICD  euvolemic today Stable on an appropriate medical regimen Normal ICD function See Pace Art report No changes today  2. HLD Continue statin  Current medicines are reviewed at length with the patient today.    Labs/ tests ordered today include:  Orders Placed This Encounter  Procedures   CUP [place]   EKG 12-Lead    Disposition:   Follow up with Dr. Lovena Le in 12 months    Signed, Shirley Friar, PA-C  12/04/2021 10:30 AM  [place] [address] [place] [address] [phone] (office) [phone] (fax)

## 2021-12-04 ENCOUNTER — Encounter: Payer: Self-pay | Admitting: Student

## 2021-12-04 ENCOUNTER — Ambulatory Visit: Payer: Medicare Other | Attending: Student | Admitting: Student

## 2021-12-04 VITALS — BP 114/78 | HR 75 | Ht 67.5 in | Wt 174.4 lb

## 2021-12-04 DIAGNOSIS — I255 Ischemic cardiomyopathy: Secondary | ICD-10-CM | POA: Diagnosis not present

## 2021-12-04 DIAGNOSIS — I4821 Permanent atrial fibrillation: Secondary | ICD-10-CM | POA: Insufficient documentation

## 2021-12-04 DIAGNOSIS — E785 Hyperlipidemia, unspecified: Secondary | ICD-10-CM

## 2021-12-04 LAB — CUP PACEART INCLINIC DEVICE CHECK
Battery Remaining Longevity: 40 mo
Battery Voltage: 2.97 V
Brady Statistic RV Percent Paced: 0.01 %
Date Time Interrogation Session: 20231106101929
HighPow Impedance: 71 Ohm
HighPow Impedance: 71 Ohm
Implantable Lead Connection Status: 753985
Implantable Lead Implant Date: 20150810
Implantable Lead Location: 753860
Implantable Lead Model: 6935
Implantable Pulse Generator Implant Date: 20150810
Lead Channel Impedance Value: 361 Ohm
Lead Channel Impedance Value: 361 Ohm
Lead Channel Impedance Value: 456 Ohm
Lead Channel Impedance Value: 456 Ohm
Lead Channel Pacing Threshold Amplitude: 0.5 V
Lead Channel Pacing Threshold Amplitude: 0.5 V
Lead Channel Pacing Threshold Pulse Width: 0.4 ms
Lead Channel Pacing Threshold Pulse Width: 0.4 ms
Lead Channel Sensing Intrinsic Amplitude: 5.75 mV
Lead Channel Sensing Intrinsic Amplitude: 5.75 mV
Lead Channel Sensing Intrinsic Amplitude: 6.125 mV
Lead Channel Sensing Intrinsic Amplitude: 6.125 mV
Lead Channel Setting Pacing Amplitude: 2 V
Lead Channel Setting Pacing Pulse Width: 0.4 ms
Lead Channel Setting Sensing Sensitivity: 0.3 mV
Zone Setting Status: 755011

## 2021-12-04 NOTE — Patient Instructions (Signed)
Medication Instructions:  Your physician recommends that you continue on your current medications as directed. Please refer to the Current Medication list given to you today.  *If you need a refill on your cardiac medications before your next appointment, please call your pharmacy*   Lab Work: None If you have labs (blood work) drawn today and your tests are completely normal, you will receive your results only by: [place] (if you have MyChart) OR A paper copy in the mail If you have any lab test that is abnormal or we need to change your treatment, we will call you to review the results.   Follow-Up: At [facility], you and your health needs are our priority.  As part of our continuing mission to provide you with exceptional heart care, we have created designated Provider Care Teams.  These Care Teams include your primary Cardiologist (physician) and Advanced Practice Providers (APPs -  Physician Assistants and Nurse Practitioners) who all work together to provide you with the care you need, when you need it.  Your next appointment:   1 year(s)  The format for your next appointment:   In Person  Provider:   [name], MD    Other Instructions HOLD Digoxin the morning of your appointment with Dr. Angelena Form  Important Information About Sugar

## 2021-12-13 ENCOUNTER — Ambulatory Visit: Payer: Medicare Other | Attending: Cardiovascular Disease | Admitting: Cardiovascular Disease

## 2021-12-13 ENCOUNTER — Encounter: Payer: Self-pay | Admitting: Cardiovascular Disease

## 2021-12-13 VITALS — BP 130/74 | HR 77 | Ht 67.0 in | Wt 174.0 lb

## 2021-12-13 DIAGNOSIS — I10 Essential (primary) hypertension: Secondary | ICD-10-CM

## 2021-12-13 DIAGNOSIS — E785 Hyperlipidemia, unspecified: Secondary | ICD-10-CM | POA: Diagnosis not present

## 2021-12-13 DIAGNOSIS — I255 Ischemic cardiomyopathy: Secondary | ICD-10-CM

## 2021-12-13 DIAGNOSIS — I2510 Atherosclerotic heart disease of native coronary artery without angina pectoris: Secondary | ICD-10-CM | POA: Diagnosis not present

## 2021-12-13 NOTE — Progress Notes (Signed)
Chief Complaint  Patient presents with   Follow-up    CAD    History of Present Illness: 61 yo male with history of anxiety, CAD with prior stenting of the LAD and obtuse marginal, ischemic cardiomyopathy s/p ICD, chronic kidney disease, COPD, chronic systolic CHF, HTN, hyperlipidemia, former tobacco abuse, iron deficiency anemia, colon cancer s/p colectomy here today for follow up. He has been followed in the past by Dr. Wynonia Lawman. Given Dr. Thurman Coyer absence due to illness, Christopher [name] was seen in our [facility] Cardiology office by Dr. Bettina Gavia 02/10/18 but became angry at that office when he found out the cost of Entresto. I met him in February 2020. He has a complex past medical history. He has undergone a colectomy for colon cancer. He was admitted to [facility] May 2015 with a late presenting anterior STEMI. He was found to have chronic total occlusion of the mid RCA and a totally occluded proximal to mid LAD. There was moderate disease in the Circumflex artery. LVEF was 15-20%. Overlapping drug eluting stents were placed in the mid LAD. ICD implanted 09/07/13. His ICD has been followed in our EP clinic by Dr. Lovena Le. Cardiac cath November 2016 with widely patent stent in the LAD. Chronically occluded RCA with filling from left to right collaterals. There was a severe stenosis in the obtuse marginal branch treated with a drug eluting stent. Echo April 2018 with LVEF=25-30%. No significant valve disease.   He is here today for follow up. The patient denies any chest pain, dyspnea, palpitations, lower extremity edema, orthopnea, PND, dizziness, near syncope or syncope.    Primary Care Physician: Patient, No Pcp Per  Past Medical History:  Diagnosis Date   Adenocarcinoma of descending colon  pT3, pN1a, pMX s/p colectomy/ostomy 05/18/2016 05/26/2016   AICD (automatic cardioverter/defibrillator) present    09/07/13 Dr. Lovena Le post MI with EF 30% RV lead      Medtronic 6947 (serial number [id number]) Generator    Medtronic  Evera XT VR (serial Number [id number])    Anemia    Anemia due to blood loss 05/03/2016   Anxiety    Anxiety state 01/17/2015   Automatic implantable cardioverter-defibrillator in situ    MDT Aug 2015 Dr. Lovena Le   Benign neoplasm of cecum    CAD (coronary artery disease), native coronary artery 07/03/2013   Cath 06/20/13  Normal left main, occluded LAD, occluded RCA, 50% circ EF 15%  3.0 x28 and 3.0 x 8 mm Xience stent Dr. Tamala Julian  To LAD    Cancer [facility])    Cardiomyopathy, ischemic    Chronic anticoagulation 06/12/2017   Chronic kidney disease    Chronic systolic CHF (congestive heart failure) ([place])    ECHO 08/05/13  EF 30%  Anterior akinesis and inferior hypokinesis    COPD (chronic obstructive pulmonary disease) ([place])    Essential hypertension 05/16/2016   Financial difficulties 10/01/2013   History of colon cancer    Hyperlipidemia    Intra-abdominal abscess 06/12/2017   Iron deficiency anemia due to chronic blood loss 07/02/2016   Mass of colon 04/2016   Myocardial infarction [facility]) 04/29/2013   Old anterior myocardial infarction 06/20/2013   per pt 12/22/2014 had an MI   Partial bowel obstruction ([place]) 05/16/2016   S/P colostomy takedown 05/20/2017   Thrombocytopenia ([place]) 12/27/2014   Chronic     Tobacco use 07/03/2013    Past Surgical History:  Procedure Laterality Date   CARDIAC CATHETERIZATION  05/2013    CARDIAC CATHETERIZATION N/A 12/28/2014  Procedure: Left Heart Cath and Coronary Angiography;  Surgeon: [name], MD;  Location: [place] CV LAB;  Service: Cardiovascular;  Laterality: N/A;   CARDIAC CATHETERIZATION N/A 12/28/2014   Procedure: Coronary Stent Intervention;  Surgeon: [name], MD;  Location: [place] CV LAB;  Service: Cardiovascular;  Laterality: N/A;   COLECTOMY WITH COLOSTOMY CREATION/HARTMANN PROCEDURE Left 05/18/2016   Procedure: COLECTOMY WITH OSTOMY CREATION/HARTMANN PROCEDURE;  Surgeon: [name], MD;  Location: [place];  Service:  General;  Laterality: Left;   COLONOSCOPY     COLONOSCOPY WITH PROPOFOL N/A 04/01/2017   Procedure: COLONOSCOPY WITH PROPOFOL;  Surgeon: [name], MD;  Location: [facility];  Service: Gastroenterology;  Laterality: N/A;   COLOSTOMY TAKEDOWN N/A 05/20/2017   Procedure: COLOSTOMY TAKEDOWN;  Surgeon: [name], MD;  Location: [place];  Service: General;  Laterality: N/A;   CORONARY ANGIOPLASTY  05/2013    FLEXIBLE SIGMOIDOSCOPY N/A 05/16/2016   Procedure: FLEXIBLE SIGMOIDOSCOPY;  Surgeon: [name], MD;  Location: [place];  Service: Gastroenterology;  Laterality: N/A;   HAND SURGERY Right    cyst removal   ICD placement  09/07/2013    IMPLANTABLE CARDIOVERTER DEFIBRILLATOR IMPLANT N/A 09/07/2013   Procedure: IMPLANTABLE CARDIOVERTER DEFIBRILLATOR IMPLANT;  Surgeon: [name], MD;  Location: [facility] CATH LAB;  Service: Cardiovascular;  Laterality: N/A;   INTRA-AORTIC BALLOON PUMP INSERTION  06/20/2013   Procedure: INTRA-AORTIC BALLOON PUMP INSERTION;  Surgeon: Sinclair Grooms, MD;  Location: [facility] CATH LAB;  Service: Cardiovascular;;   LEFT HEART CATHETERIZATION WITH CORONARY ANGIOGRAM N/A 06/20/2013   Procedure: LEFT HEART CATHETERIZATION WITH CORONARY ANGIOGRAM;  Surgeon: Sinclair Grooms, MD;  Location: [facility] CATH LAB;  Service: Cardiovascular;  Laterality: N/A;   PERCUTANEOUS CORONARY STENT INTERVENTION (PCI-S)  06/20/2013   Procedure: PERCUTANEOUS CORONARY STENT INTERVENTION (PCI-S);  Surgeon: Sinclair Grooms, MD;  Location: [facility] CATH LAB;  Service: Cardiovascular;;    Current Outpatient Medications  Medication Sig Dispense Refill   atorvastatin (LIPITOR) 80 MG tablet TAKE 1 TABLET BY MOUTH ONCE DAILY AT  6PM 90 tablet 3   carvedilol (COREG) 3.125 MG tablet Take 1 tablet (3.125 mg total) by mouth 2 (two) times daily with a meal. 180 tablet 3   clopidogrel (PLAVIX) 75 MG tablet Take 1 tablet (75 mg total) by mouth daily. 90 tablet 3   digoxin (LANOXIN) 0.125 MG tablet Take  1 tablet (0.125 mg total) by mouth daily. 90 tablet 3   lisinopril (ZESTRIL) 2.5 MG tablet Take 1 tablet (2.5 mg total) by mouth daily. 90 tablet 2   Multiple Vitamin (MULTIVITAMIN WITH MINERALS) TABS tablet Take 1 tablet by mouth daily. 30 tablet 0   nitroGLYCERIN (NITROSTAT) 0.4 MG SL tablet Place 1 tablet (0.4 mg total) under the tongue every 5 (five) minutes as needed for chest pain. 25 tablet 6   saccharomyces boulardii (FLORASTOR) 250 MG capsule Take 1 capsule (250 mg total) by mouth 2 (two) times daily. 30 capsule 0   No current facility-administered medications for this visit.   Facility-Administered Medications Ordered in Other Visits  Medication Dose Route Frequency Provider Last Rate Last Admin   influenza vac split quadrivalent PF (FLUARIX) injection 0.5 mL  0.5 mL Intramuscular Once [name], MD        Allergies  Allergen Reactions   Capecitabine Other (See Comments)    Server dehydration and diarrhea       Celebrex [Celecoxib] Other (See Comments)  Caused his back to burn   Contrast Media [Iodinated Contrast Media] Other (See Comments)    Patient feels weakness    Social History   Socioeconomic History   Marital status: Single    Spouse name: Not on file   Number of children: 0   Years of education: 2   Highest education level: Not on file  Occupational History   Occupation: Disabled  Tobacco Use   Smoking status: Former    Packs/day: 2.00    Years: 40.00    Total pack years: 80.00    Types: Cigarettes    Quit date: 12/22/2014    Years since quitting: 6.9   Smokeless tobacco: Never  Vaping Use   Vaping Use: Never used  Substance and Sexual Activity   Alcohol use: No    Comment: quit in 08/2013, he used to drink alcohol moderate (1 pine liquor one week)  for 30 years    Drug use: No   Sexual activity: Not Currently    Birth control/protection: Condom    Comment: men   Other Topics Concern   Not on file  Social History Narrative   Lives alone.     In a house trying to sell his home. Exercise: No.   Social Determinants of Health   Financial Resource Strain: Not on file  Food Insecurity: Not on file  Transportation Needs: Not on file  Physical Activity: Not on file  Stress: Not on file  Social Connections: Not on file  Intimate Partner Violence: Not on file    Family History  Problem Relation Age of Onset   Emphysema Father    Lung disease Father    Heart disease Mother    Dementia Mother    Diabetes Mother    CVA Mother    Alzheimer's disease Mother    Pancreatic cancer Maternal Grandmother    Lung cancer Maternal Grandmother    Cancer Neg Hx     Review of Systems:  As stated in the HPI and otherwise negative.   BP 130/74   Pulse 77   Ht _0  (1.702 m)   Wt 174 lb (78.9 kg)   SpO2 97%   BMI 27.25 kg/m   Physical Examination:   Echo April 2018: - Left ventricle: The cavity size was mildly dilated. Wall   thickness was normal. Systolic function was severely reduced. The   estimated ejection fraction was in the range of 25% to 30%.   Akinesis of the anteroseptal, anterior, and apical myocardium.   Doppler parameters are consistent with abnormal left ventricular   relaxation (grade 1 diastolic dysfunction).  EKG:  EKG is not ordered today. The ekg ordered today demonstrates  Recent Labs: 04/13/2021: ALT 22; BUN 27; Creatinine, Ser 1.33; Hemoglobin 13.7; Platelets 130; Potassium 4.3; Sodium 140   Lipid Panel    Component Value Date/Time   CHOL 117 12/14/2020 1027   TRIG 102 12/14/2020 1027   HDL 34 (L) 12/14/2020 1027   CHOLHDL 3.4 12/14/2020 1027   CHOLHDL 3.9 12/23/2014 0331   VLDL 13 12/23/2014 0331   LDLCALC 64 12/14/2020 1027     Wt Readings from Last 3 Encounters:  12/13/21 174 lb (78.9 kg)  12/04/21 174 lb 6.4 oz (79.1 kg)  04/13/21 173 lb 11.2 oz (78.8 kg)    Assessment and Plan:   1. CAD with angina: No change in chronic chest pain. Will continue Plavix, statin and beta blocker.    2. Ischemic cardiomyopathy: LVEF=30% by echo  in 2018. No volume overload on exam. ICD in place and followed by Dr. Lovena Le. He has refused to consider Entresto due to cost. Continue Coreg, Lisinopril and Digoxin. I have discussed setting up an echo today but he does not wish to do this. Will discuss an echo at the next visit. Check digoxin level today.   3. HTN: BP is controlled. No changes  4. Hyperlipidemia: LDL at goal in November 2022. Continue statin. Will check lipids and LFTs today.    5. Former tobacco abuse: He stopped smoking  Follow up with me in 12 months   Labs/ tests ordered today include:  Orders Placed This Encounter  Procedures   Lipid panel   Hepatic function panel   Digoxin level   Signed, Lauree Chandler, MD 12/13/2021 9:57 AM    [facility] [place], [place], [place]  [address] Phone: [phone]; Fax: [phone]

## 2021-12-13 NOTE — Patient Instructions (Signed)
Medication Instructions:  No changes *If you need a refill on your cardiac medications before your next appointment, please call your pharmacy*   Lab Work: Today: lipids/liver/digoxin level  If you have labs (blood work) drawn today and your tests are completely normal, you will receive your results only by: [place] (if you have MyChart) OR A paper copy in the mail If you have any lab test that is abnormal or we need to change your treatment, we will call you to review the results.   Testing/Procedures: none   Follow-Up: At [facility], you and your health needs are our priority.  As part of our continuing mission to provide you with exceptional heart care, we have created designated Provider Care Teams.  These Care Teams include your primary Cardiologist (physician) and Advanced Practice Providers (APPs -  Physician Assistants and Nurse Practitioners) who all work together to provide you with the care you need, when you need it.  We recommend signing up for the patient portal called "MyChart".  Sign up information is provided on this After Visit Summary.  MyChart is used to connect with patients for Virtual Visits (Telemedicine).  Patients are able to view lab/test results, encounter notes, upcoming appointments, etc.  Non-urgent messages can be sent to your provider as well.   To learn more about what you can do with MyChart, go to [email or web address].    Your next appointment:   12 month(s)  The format for your next appointment:   In Person  Provider:   Lauree Chandler, MD      Important Information About Sugar

## 2021-12-15 LAB — HEPATIC FUNCTION PANEL
ALT: 26 IU/L (ref 0–44)
AST: 25 IU/L (ref 0–40)
Albumin: 4.3 g/dL (ref 3.8–4.9)
Alkaline Phosphatase: 56 IU/L (ref 44–121)
Bilirubin Total: 0.5 mg/dL (ref 0.0–1.2)
Bilirubin, Direct: 0.15 mg/dL (ref 0.00–0.40)
Total Protein: 6.4 g/dL (ref 6.0–8.5)

## 2021-12-15 LAB — DIGOXIN LEVEL: Digoxin, Serum: <0.4 ng/mL — ABNORMAL LOW (ref 0.5–0.9)

## 2021-12-15 LAB — LIPID PANEL
Chol/HDL Ratio: 3.6 ratio (ref 0.0–5.0)
Cholesterol, Total: 127 mg/dL (ref 100–199)
HDL: 35 mg/dL — ABNORMAL LOW (ref >39)
LDL Chol Calc (NIH): 76 mg/dL (ref 0–99)
Triglycerides: 81 mg/dL (ref 0–149)
VLDL Cholesterol Cal: 16 mg/dL (ref 5–40)

## 2021-12-24 LAB — CUP PACEART REMOTE DEVICE CHECK
Battery Remaining Longevity: 40 mo
Battery Voltage: 2.9 V
Brady Statistic RV Percent Paced: 0.01 %
Date Time Interrogation Session: 20231126043822
HighPow Impedance: 74 Ohm
Implantable Lead Connection Status: 753985
Implantable Lead Implant Date: 20150810
Implantable Lead Location: 753860
Implantable Lead Model: 6935
Implantable Pulse Generator Implant Date: 20150810
Lead Channel Impedance Value: 361 Ohm
Lead Channel Impedance Value: 456 Ohm
Lead Channel Pacing Threshold Amplitude: 0.5 V
Lead Channel Pacing Threshold Pulse Width: 0.4 ms
Lead Channel Sensing Intrinsic Amplitude: 6.25 mV
Lead Channel Sensing Intrinsic Amplitude: 6.25 mV
Lead Channel Setting Pacing Amplitude: 2 V
Lead Channel Setting Pacing Pulse Width: 0.4 ms
Lead Channel Setting Sensing Sensitivity: 0.3 mV
Zone Setting Status: 755011

## 2021-12-26 ENCOUNTER — Ambulatory Visit (INDEPENDENT_AMBULATORY_CARE_PROVIDER_SITE_OTHER): Payer: Medicare Other

## 2021-12-26 DIAGNOSIS — Z95810 Presence of automatic (implantable) cardiac defibrillator: Secondary | ICD-10-CM | POA: Diagnosis not present

## 2021-12-26 DIAGNOSIS — I255 Ischemic cardiomyopathy: Secondary | ICD-10-CM

## 2022-01-22 ENCOUNTER — Encounter: Payer: Self-pay | Admitting: Cardiovascular Disease

## 2022-01-23 ENCOUNTER — Other Ambulatory Visit: Payer: Self-pay | Admitting: *Deleted

## 2022-01-23 MED ORDER — LISINOPRIL 2.5 MG PO TABS: 2.5000 mg | ORAL_TABLET | Freq: Every day | ORAL | 2 refills | Status: DC

## 2022-01-24 NOTE — Progress Notes (Signed)
Remote ICD transmission.   

## 2022-01-31 ENCOUNTER — Encounter: Payer: Self-pay | Admitting: Cardiovascular Disease

## 2022-01-31 MED ORDER — DIGOXIN 125 MCG PO TABS: 0.1250 mg | ORAL_TABLET | Freq: Every day | ORAL | 3 refills | Status: DC

## 2022-02-26 ENCOUNTER — Encounter: Payer: Self-pay | Admitting: Cardiovascular Disease

## 2022-02-26 MED ORDER — ATORVASTATIN CALCIUM 80 MG PO TABS
ORAL_TABLET | ORAL | 3 refills | Status: DC
Start: 2022-02-26 — End: 2023-02-14

## 2022-03-27 ENCOUNTER — Ambulatory Visit: Payer: Medicare Other

## 2022-03-27 ENCOUNTER — Encounter: Payer: Self-pay | Admitting: Cardiovascular Disease

## 2022-03-27 DIAGNOSIS — I255 Ischemic cardiomyopathy: Secondary | ICD-10-CM | POA: Diagnosis not present

## 2022-03-27 LAB — CUP PACEART REMOTE DEVICE CHECK
Battery Remaining Longevity: 41 mo
Battery Voltage: 2.91 V
Brady Statistic RV Percent Paced: 0.01 %
Date Time Interrogation Session: 20240227014244
HighPow Impedance: 75 Ohm
Implantable Lead Connection Status: 753985
Implantable Lead Implant Date: 20150810
Implantable Lead Location: 753860
Implantable Lead Model: 6935
Implantable Pulse Generator Implant Date: 20150810
Lead Channel Impedance Value: 342 Ohm
Lead Channel Impedance Value: 456 Ohm
Lead Channel Pacing Threshold Amplitude: 0.5 V
Lead Channel Pacing Threshold Pulse Width: 0.4 ms
Lead Channel Sensing Intrinsic Amplitude: 6.75 mV
Lead Channel Sensing Intrinsic Amplitude: 6.75 mV
Lead Channel Setting Pacing Amplitude: 2 V
Lead Channel Setting Pacing Pulse Width: 0.4 ms
Lead Channel Setting Sensing Sensitivity: 0.3 mV
Zone Setting Status: 755011

## 2022-03-27 MED ORDER — CLOPIDOGREL BISULFATE 75 MG PO TABS: 75.0000 mg | ORAL_TABLET | Freq: Every day | ORAL | 3 refills | Status: DC

## 2022-03-27 MED ORDER — CARVEDILOL 3.125 MG PO TABS: 3.1250 mg | ORAL_TABLET | Freq: Two times a day (BID) | ORAL | 3 refills | Status: DC

## 2022-05-02 NOTE — Progress Notes (Signed)
Remote ICD transmission.   

## 2022-06-26 ENCOUNTER — Ambulatory Visit (INDEPENDENT_AMBULATORY_CARE_PROVIDER_SITE_OTHER): Payer: Medicare Other

## 2022-06-26 DIAGNOSIS — I255 Ischemic cardiomyopathy: Secondary | ICD-10-CM

## 2022-06-26 LAB — CUP PACEART REMOTE DEVICE CHECK
Battery Remaining Longevity: 36 mo
Battery Voltage: 2.9 V
Brady Statistic RV Percent Paced: 0.01 %
Date Time Interrogation Session: 20240527220746
HighPow Impedance: 67 Ohm
Implantable Lead Connection Status: 753985
Implantable Lead Implant Date: 20150810
Implantable Lead Location: 753860
Implantable Lead Model: 6935
Implantable Pulse Generator Implant Date: 20150810
Lead Channel Impedance Value: 342 Ohm
Lead Channel Impedance Value: 418 Ohm
Lead Channel Pacing Threshold Amplitude: 0.5 V
Lead Channel Pacing Threshold Pulse Width: 0.4 ms
Lead Channel Sensing Intrinsic Amplitude: 8.625 mV
Lead Channel Sensing Intrinsic Amplitude: 8.625 mV
Lead Channel Setting Pacing Amplitude: 2 V
Lead Channel Setting Pacing Pulse Width: 0.4 ms
Lead Channel Setting Sensing Sensitivity: 0.3 mV
Zone Setting Status: 755011

## 2022-07-02 ENCOUNTER — Encounter: Payer: Self-pay | Admitting: Cardiovascular Disease

## 2022-07-02 ENCOUNTER — Other Ambulatory Visit: Payer: Self-pay

## 2022-07-02 MED ORDER — NITROGLYCERIN 0.4 MG SL SUBL: 0.4000 mg | SUBLINGUAL_TABLET | SUBLINGUAL | 6 refills | Status: DC | PRN

## 2022-07-20 NOTE — Progress Notes (Signed)
Remote ICD transmission.   

## 2022-07-23 ENCOUNTER — Other Ambulatory Visit: Payer: Self-pay | Admitting: Cardiovascular Disease

## 2022-09-07 ENCOUNTER — Encounter: Payer: Self-pay | Admitting: Internal Medicine

## 2022-09-07 NOTE — Telephone Encounter (Signed)
Pt sent in 2 my chart messages.  Appointment addressed in the other my chart encounter.

## 2022-09-25 ENCOUNTER — Ambulatory Visit (INDEPENDENT_AMBULATORY_CARE_PROVIDER_SITE_OTHER): Payer: Medicare Other

## 2022-09-25 DIAGNOSIS — I255 Ischemic cardiomyopathy: Secondary | ICD-10-CM

## 2022-09-26 LAB — CUP PACEART REMOTE DEVICE CHECK
Battery Remaining Longevity: 35 mo
Battery Voltage: 2.89 V
Brady Statistic RV Percent Paced: 0.01 %
Date Time Interrogation Session: 20240827043823
HighPow Impedance: 77 Ohm
Implantable Lead Connection Status: 753985
Implantable Lead Implant Date: 20150810
Implantable Lead Location: 753860
Implantable Lead Model: 6935
Implantable Pulse Generator Implant Date: 20150810
Lead Channel Impedance Value: 342 Ohm
Lead Channel Impedance Value: 456 Ohm
Lead Channel Pacing Threshold Amplitude: 0.5 V
Lead Channel Pacing Threshold Pulse Width: 0.4 ms
Lead Channel Sensing Intrinsic Amplitude: 8.625 mV
Lead Channel Sensing Intrinsic Amplitude: 8.625 mV
Lead Channel Setting Pacing Amplitude: 2 V
Lead Channel Setting Pacing Pulse Width: 0.4 ms
Lead Channel Setting Sensing Sensitivity: 0.3 mV
Zone Setting Status: 755011

## 2022-10-05 NOTE — Progress Notes (Signed)
Remote ICD transmission.   

## 2022-10-17 DIAGNOSIS — Z23 Encounter for immunization: Secondary | ICD-10-CM | POA: Diagnosis not present

## 2022-10-18 ENCOUNTER — Other Ambulatory Visit: Payer: Self-pay | Admitting: Cardiovascular Disease

## 2022-12-25 ENCOUNTER — Encounter: Payer: Self-pay | Admitting: Internal Medicine

## 2022-12-25 ENCOUNTER — Ambulatory Visit: Payer: Medicare Other

## 2022-12-25 ENCOUNTER — Ambulatory Visit: Payer: Medicare Other | Attending: Internal Medicine | Admitting: Internal Medicine

## 2022-12-25 VITALS — BP 130/82 | HR 71 | Ht 67.0 in | Wt 177.2 lb

## 2022-12-25 DIAGNOSIS — I255 Ischemic cardiomyopathy: Secondary | ICD-10-CM

## 2022-12-25 LAB — CUP PACEART REMOTE DEVICE CHECK
Battery Remaining Longevity: 34 mo
Battery Voltage: 2.95 V
Brady Statistic RV Percent Paced: 0.01 %
Date Time Interrogation Session: 20241126022502
HighPow Impedance: 74 Ohm
Implantable Lead Connection Status: 753985
Implantable Lead Implant Date: 20150810
Implantable Lead Location: 753860
Implantable Lead Model: 6935
Implantable Pulse Generator Implant Date: 20150810
Lead Channel Impedance Value: 361 Ohm
Lead Channel Impedance Value: 456 Ohm
Lead Channel Pacing Threshold Amplitude: 0.5 V
Lead Channel Pacing Threshold Pulse Width: 0.4 ms
Lead Channel Sensing Intrinsic Amplitude: 8.625 mV
Lead Channel Sensing Intrinsic Amplitude: 8.625 mV
Lead Channel Setting Pacing Amplitude: 2 V
Lead Channel Setting Pacing Pulse Width: 0.4 ms
Lead Channel Setting Sensing Sensitivity: 0.3 mV
Zone Setting Status: 755011

## 2022-12-25 LAB — CUP PACEART INCLINIC DEVICE CHECK
Battery Remaining Longevity: 34 mo
Battery Voltage: 2.95 V
Brady Statistic RV Percent Paced: 0.01 %
Date Time Interrogation Session: 20241126101730
HighPow Impedance: 79 Ohm
Implantable Lead Connection Status: 753985
Implantable Lead Implant Date: 20150810
Implantable Lead Location: 753860
Implantable Lead Model: 6935
Implantable Pulse Generator Implant Date: 20150810
Lead Channel Impedance Value: 342 Ohm
Lead Channel Impedance Value: 456 Ohm
Lead Channel Pacing Threshold Amplitude: 0.5 V
Lead Channel Pacing Threshold Pulse Width: 0.4 ms
Lead Channel Sensing Intrinsic Amplitude: 6.625 mV
Lead Channel Sensing Intrinsic Amplitude: 8.625 mV
Lead Channel Setting Pacing Amplitude: 2 V
Lead Channel Setting Pacing Pulse Width: 0.4 ms
Lead Channel Setting Sensing Sensitivity: 0.3 mV
Zone Setting Status: 755011

## 2022-12-25 NOTE — Patient Instructions (Addendum)
Medication Instructions:  Your physician recommends that you continue on your current medications as directed. Please refer to the Current Medication list given to you today.  *If you need a refill on your cardiac medications before your next appointment, please call your pharmacy*  Lab Work: None ordered.  If you have labs (blood work) drawn today and your tests are completely normal, you will receive your results only by: MyChart Message (if you have MyChart) OR A paper copy in the mail If you have any lab test that is abnormal or we need to change your treatment, we will call you to review the results.  Testing/Procedures: None ordered.  Follow-Up: At [facility], you and your health needs are our priority.  As part of our continuing mission to provide you with exceptional heart care, we have created designated Provider Care Teams.  These Care Teams include your primary Cardiologist (physician) and Advanced Practice Providers (APPs -  Physician Assistants and Nurse Practitioners) who all work together to provide you with the care you need, when you need it.  Your next appointment:   1 year(s)  The format for your next appointment:   In Person  Provider:   Dr Cherly Beach  Remote monitoring is used to monitor your Pacemaker/ ICD from home. This monitoring reduces the number of office visits required to check your device to one time per year. It allows [place] to keep an eye on the functioning of your device to ensure it is working properly.

## 2022-12-25 NOTE — Progress Notes (Signed)
HPI Christopher [name] returns today for followup. He has a longstanding ICM, chronic class 2 CHF, dyslipidemia, s/p ICD insertion. In the interim he has had some GI complaints and is pending Colonoscopy. He has not had chest pain, sob or edema. No ICD therapy.  Allergies  Allergen Reactions   Capecitabine Other (See Comments)    Server dehydration and diarrhea       Celebrex [Celecoxib] Other (See Comments)    Caused his back to burn   Contrast Media [Iodinated Contrast Media] Other (See Comments)    Patient feels weakness     Current Outpatient Medications  Medication Sig Dispense Refill   atorvastatin (LIPITOR) 80 MG tablet TAKE 1 TABLET BY MOUTH ONCE DAILY AT  6PM 90 tablet 3   carvedilol (COREG) 3.125 MG tablet Take 1 tablet (3.125 mg total) by mouth 2 (two) times daily with a meal. 180 tablet 3   clopidogrel (PLAVIX) 75 MG tablet Take 1 tablet (75 mg total) by mouth daily. 90 tablet 3   digoxin (LANOXIN) 0.125 MG tablet Take 1 tablet (0.125 mg total) by mouth daily. 90 tablet 3   lisinopril (ZESTRIL) 2.5 MG tablet Take 1 tablet by mouth once daily 90 tablet 0   Multiple Vitamin (MULTIVITAMIN WITH MINERALS) TABS tablet Take 1 tablet by mouth daily. 30 tablet 0   nitroGLYCERIN (NITROSTAT) 0.4 MG SL tablet PLACE ONE TABLET UNDER THE TONGUE EVERY 5 MINUTES AS NEEDED FOR CHEST PAIN 25 tablet 4   saccharomyces boulardii (FLORASTOR) 250 MG capsule Take 1 capsule (250 mg total) by mouth 2 (two) times daily. 30 capsule 0   No current facility-administered medications for this visit.   Facility-Administered Medications Ordered in Other Visits  Medication Dose Route Frequency Provider Last Rate Last Admin   influenza vac split quadrivalent PF (FLUARIX) injection 0.5 mL  0.5 mL Intramuscular Once Malachy Mood, MD         Past Medical History:  Diagnosis Date   Adenocarcinoma of descending colon  pT3, pN1a, pMX s/p colectomy/ostomy 05/18/2016 05/26/2016   AICD (automatic  cardioverter/defibrillator) present    09/07/13 Dr. Ladona Ridgel post MI with EF 30% RV lead      Medtronic 6947 (serial number [id number]) Generator   Medtronic  Evera XT VR (serial Number [id number])    Anemia    Anemia due to blood loss 05/03/2016   Anxiety    Anxiety state 01/17/2015   Automatic implantable cardioverter-defibrillator in situ    MDT Aug 2015 Dr. Ladona Ridgel   Benign neoplasm of cecum    CAD (coronary artery disease), native coronary artery 07/03/2013   Cath 06/20/13  Normal left main, occluded LAD, occluded RCA, 50% circ EF 15%  3.0 x28 and 3.0 x 8 mm Xience stent Dr. Katrinka Blazing  To LAD    Cancer [facility])    Cardiomyopathy, ischemic    Chronic anticoagulation 06/12/2017   Chronic kidney disease    Chronic systolic CHF (congestive heart failure) ([facility])    ECHO 08/05/13  EF 30%  Anterior akinesis and inferior hypokinesis    COPD (chronic obstructive pulmonary disease) ([facility])    Essential hypertension 05/16/2016   Financial difficulties 10/01/2013   History of colon cancer    Hyperlipidemia    Intra-abdominal abscess 06/12/2017   Iron deficiency anemia due to chronic blood loss 07/02/2016   Mass of colon 04/2016   Myocardial infarction [facility]) 04/29/2013   Old anterior myocardial infarction 06/20/2013   per pt 12/22/2014 had an MI  Partial bowel obstruction ([facility]) 05/16/2016   S/P colostomy takedown 05/20/2017   Thrombocytopenia ([facility]) 12/27/2014   Chronic     Tobacco use 07/03/2013    ROS:   All systems reviewed and negative except as noted in the HPI.   Past Surgical History:  Procedure Laterality Date   CARDIAC CATHETERIZATION  05/2013    CARDIAC CATHETERIZATION N/A 12/28/2014   Procedure: Left Heart Cath and Coronary Angiography;  Surgeon: Lennette Bihari, MD;  Location: [facility] INVASIVE CV LAB;  Service: Cardiovascular;  Laterality: N/A;   CARDIAC CATHETERIZATION N/A 12/28/2014   Procedure: Coronary Stent Intervention;  Surgeon: Lennette Bihari, MD;  Location: [facility] INVASIVE CV LAB;  Service:  Cardiovascular;  Laterality: N/A;   COLECTOMY WITH COLOSTOMY CREATION/HARTMANN PROCEDURE Left 05/18/2016   Procedure: COLECTOMY WITH OSTOMY CREATION/HARTMANN PROCEDURE;  Surgeon: Violeta Gelinas, MD;  Location: [facility] OR;  Service: General;  Laterality: Left;   COLONOSCOPY     COLONOSCOPY WITH PROPOFOL N/A 04/01/2017   Procedure: COLONOSCOPY WITH PROPOFOL;  Surgeon: Sherrilyn Rist, MD;  Location: [facility] ENDOSCOPY;  Service: Gastroenterology;  Laterality: N/A;   COLOSTOMY TAKEDOWN N/A 05/20/2017   Procedure: COLOSTOMY TAKEDOWN;  Surgeon: Violeta Gelinas, MD;  Location: [facility] OR;  Service: General;  Laterality: N/A;   CORONARY ANGIOPLASTY  05/2013    FLEXIBLE SIGMOIDOSCOPY N/A 05/16/2016   Procedure: FLEXIBLE SIGMOIDOSCOPY;  Surgeon: Sherrilyn Rist, MD;  Location: [facility] ENDOSCOPY;  Service: Gastroenterology;  Laterality: N/A;   HAND SURGERY Right    cyst removal   ICD placement  09/07/2013    IMPLANTABLE CARDIOVERTER DEFIBRILLATOR IMPLANT N/A 09/07/2013   Procedure: IMPLANTABLE CARDIOVERTER DEFIBRILLATOR IMPLANT;  Surgeon: Marinus Maw, MD;  Location: [facility] CATH LAB;  Service: Cardiovascular;  Laterality: N/A;   INTRA-AORTIC BALLOON PUMP INSERTION  06/20/2013   Procedure: INTRA-AORTIC BALLOON PUMP INSERTION;  Surgeon: Lesleigh Noe, MD;  Location: [facility] CATH LAB;  Service: Cardiovascular;;   LEFT HEART CATHETERIZATION WITH CORONARY ANGIOGRAM N/A 06/20/2013   Procedure: LEFT HEART CATHETERIZATION WITH CORONARY ANGIOGRAM;  Surgeon: Lesleigh Noe, MD;  Location: [facility] CATH LAB;  Service: Cardiovascular;  Laterality: N/A;   PERCUTANEOUS CORONARY STENT INTERVENTION (PCI-S)  06/20/2013   Procedure: PERCUTANEOUS CORONARY STENT INTERVENTION (PCI-S);  Surgeon: Lesleigh Noe, MD;  Location: [facility] CATH LAB;  Service: Cardiovascular;;     Family History  Problem Relation Age of Onset   Emphysema Father    Lung disease Father    Heart disease Mother    Dementia Mother    Diabetes Mother    CVA Mother    Alzheimer's  disease Mother    Pancreatic cancer Maternal Grandmother    Lung cancer Maternal Grandmother    Cancer Neg Hx      Social History   Socioeconomic History   Marital status: Single    Spouse name: Not on file   Number of children: 0   Years of education: 12   Highest education level: Not on file  Occupational History   Occupation: Disabled  Tobacco Use   Smoking status: Former    Current packs/day: 0.00    Average packs/day: 2.0 packs/day for 40.0 years (80.0 ttl pk-yrs)    Types: Cigarettes    Start date: 12/22/1974    Quit date: 12/22/2014    Years since quitting: 8.0   Smokeless tobacco: Never  Vaping Use   Vaping status: Never Used  Substance and Sexual Activity   Alcohol use: No    Comment: quit  in 08/2013, he used to drink alcohol moderate (1 pine liquor one week)  for 30 years    Drug use: No   Sexual activity: Not Currently    Birth control/protection: Condom    Comment: men   Other Topics Concern   Not on file  Social History Narrative   Lives alone.    In a house trying to sell his home. Exercise: No.   Social Determinants of Health   Financial Resource Strain: Not on file  Food Insecurity: Not on file  Transportation Needs: Not on file  Physical Activity: Not on file  Stress: Not on file  Social Connections: Not on file  Intimate Partner Violence: Not on file     There were no vitals taken for this visit.  Physical Exam:  Well appearing NAD HEENT: Unremarkable Neck:  No JVD, no thyromegally Lymphatics:  No adenopathy Back:  No CVA tenderness Lungs:  Clear HEART:  Regular rate rhythm, no murmurs, no rubs, no clicks Abd:  soft, positive bowel sounds, no organomegally, no rebound, no guarding Ext:  2 plus pulses, no edema, no cyanosis, no clubbing Skin:  No rashes no nodules Neuro:  CN II through XII intact, motor grossly intact  EKG - nsr  DEVICE  Normal device function.  See PaceArt for details.   Assess/Plan:  1. ICM - he is doing  well with minimal symptoms. No change in meds. He is encouraged to remain active. 2. Chronic systolic heart failure - his symptoms are class 2. He will continue his current meds. 3. ICD - his medtronic single chamber ICD is working normally. 4. Dyslipidemia - he will  Continue his high dose statin. No muscle complaints.   Christopher Gowda Selen Smucker,MD

## 2022-12-31 NOTE — Progress Notes (Unsigned)
No chief complaint on file.   History of Present Illness: 62 yo male with history of anxiety, CAD with prior stenting of the LAD and obtuse marginal, ischemic cardiomyopathy s/p ICD, chronic kidney disease, COPD, chronic systolic CHF, HTN, hyperlipidemia, former tobacco abuse, iron deficiency anemia, colon cancer s/p colectomy here today for follow up. He has been followed in the past by Dr. Donnie Aho. Given Dr. York Spaniel absence due to illness, Mr. Mcwain was seen in our [facility]iology office by Dr. Dulce Sellar 02/10/18 but became angry at that office when he found out the cost of Entresto. I met him in February 2020. He has a complex past medical history. He has undergone a colectomy for colon cancer. He was admitted to [facility] May 2015 with a late presenting anterior STEMI. He was found to have chronic total occlusion of the mid RCA and a totally occluded proximal to mid LAD. There was moderate disease in the Circumflex artery. LVEF was 15-20%. Overlapping drug eluting stents were placed in the mid LAD. ICD implanted 09/07/13. His ICD has been followed in our EP clinic by Dr. Ladona Ridgel. Cardiac cath November 2016 with widely patent stent in the LAD. Chronically occluded RCA with filling from left to right collaterals. There was a severe stenosis in the obtuse marginal branch treated with a drug eluting stent. Echo April 2018 with LVEF=25-30%. No significant valve disease.   He is here today for follow up. The patient denies any chest pain, dyspnea, palpitations, lower extremity edema, orthopnea, PND, dizziness, near syncope or syncope.    Primary Care Physician: Patient, No Pcp Per  Past Medical History:  Diagnosis Date   Adenocarcinoma of descending colon  pT3, pN1a, pMX s/p colectomy/ostomy 05/18/2016 05/26/2016   AICD (automatic cardioverter/defibrillator) present    09/07/13 Dr. Ladona Ridgel post MI with EF 30% RV lead      Medtronic 6947 (serial number [id number]) Generator   Medtronic  Evera XT VR (serial Number  [id number])    Anemia    Anemia due to blood loss 05/03/2016   Anxiety    Anxiety state 01/17/2015   Automatic implantable cardioverter-defibrillator in situ    MDT Aug 2015 Dr. Ladona Ridgel   Benign neoplasm of cecum    CAD (coronary artery disease), native coronary artery 07/03/2013   Cath 06/20/13  Normal left main, occluded LAD, occluded RCA, 50% circ EF 15%  3.0 x28 and 3.0 x 8 mm Xience stent Dr. Katrinka Blazing  To LAD    Cancer [facility])    Cardiomyopathy, ischemic    Chronic anticoagulation 06/12/2017   Chronic kidney disease    Chronic systolic CHF (congestive heart failure) (HCC)    ECHO 08/05/13  EF 30%  Anterior akinesis and inferior hypokinesis    COPD (chronic obstructive pulmonary disease) (HCC)    Essential hypertension 05/16/2016   Financial difficulties 10/01/2013   History of colon cancer    Hyperlipidemia    Intra-abdominal abscess 06/12/2017   Iron deficiency anemia due to chronic blood loss 07/02/2016   Mass of colon 04/2016   Myocardial infarction [facility]) 04/29/2013   Old anterior myocardial infarction 06/20/2013   per pt 12/22/2014 had an MI   Partial bowel obstruction (HCC) 05/16/2016   S/P colostomy takedown 05/20/2017   Thrombocytopenia (HCC) 12/27/2014   Chronic     Tobacco use 07/03/2013    Past Surgical History:  Procedure Laterality Date   CARDIAC CATHETERIZATION  05/2013    CARDIAC CATHETERIZATION N/A 12/28/2014   Procedure: Left Heart Cath and Coronary Angiography;  Surgeon: Lennette Bihari, MD;  Location: [facility] INVASIVE CV LAB;  Service: Cardiovascular;  Laterality: N/A;   CARDIAC CATHETERIZATION N/A 12/28/2014   Procedure: Coronary Stent Intervention;  Surgeon: Lennette Bihari, MD;  Location: [facility] INVASIVE CV LAB;  Service: Cardiovascular;  Laterality: N/A;   COLECTOMY WITH COLOSTOMY CREATION/HARTMANN PROCEDURE Left 05/18/2016   Procedure: COLECTOMY WITH OSTOMY CREATION/HARTMANN PROCEDURE;  Surgeon: Violeta Gelinas, MD;  Location: [facility] OR;  Service: General;  Laterality: Left;   COLONOSCOPY      COLONOSCOPY WITH PROPOFOL N/A 04/01/2017   Procedure: COLONOSCOPY WITH PROPOFOL;  Surgeon: Sherrilyn Rist, MD;  Location: [facility] ENDOSCOPY;  Service: Gastroenterology;  Laterality: N/A;   COLOSTOMY TAKEDOWN N/A 05/20/2017   Procedure: COLOSTOMY TAKEDOWN;  Surgeon: Violeta Gelinas, MD;  Location: [facility] OR;  Service: General;  Laterality: N/A;   CORONARY ANGIOPLASTY  05/2013    FLEXIBLE SIGMOIDOSCOPY N/A 05/16/2016   Procedure: FLEXIBLE SIGMOIDOSCOPY;  Surgeon: Sherrilyn Rist, MD;  Location: [facility] ENDOSCOPY;  Service: Gastroenterology;  Laterality: N/A;   HAND SURGERY Right    cyst removal   ICD placement  09/07/2013    IMPLANTABLE CARDIOVERTER DEFIBRILLATOR IMPLANT N/A 09/07/2013   Procedure: IMPLANTABLE CARDIOVERTER DEFIBRILLATOR IMPLANT;  Surgeon: Marinus Maw, MD;  Location: [facility] CATH LAB;  Service: Cardiovascular;  Laterality: N/A;   INTRA-AORTIC BALLOON PUMP INSERTION  06/20/2013   Procedure: INTRA-AORTIC BALLOON PUMP INSERTION;  Surgeon: Lesleigh Noe, MD;  Location: [facility] CATH LAB;  Service: Cardiovascular;;   LEFT HEART CATHETERIZATION WITH CORONARY ANGIOGRAM N/A 06/20/2013   Procedure: LEFT HEART CATHETERIZATION WITH CORONARY ANGIOGRAM;  Surgeon: Lesleigh Noe, MD;  Location: [facility] CATH LAB;  Service: Cardiovascular;  Laterality: N/A;   PERCUTANEOUS CORONARY STENT INTERVENTION (PCI-S)  06/20/2013   Procedure: PERCUTANEOUS CORONARY STENT INTERVENTION (PCI-S);  Surgeon: Lesleigh Noe, MD;  Location: [facility] CATH LAB;  Service: Cardiovascular;;    Current Outpatient Medications  Medication Sig Dispense Refill   atorvastatin (LIPITOR) 80 MG tablet TAKE 1 TABLET BY MOUTH ONCE DAILY AT  6PM 90 tablet 3   carvedilol (COREG) 3.125 MG tablet Take 1 tablet (3.125 mg total) by mouth 2 (two) times daily with a meal. 180 tablet 3   clopidogrel (PLAVIX) 75 MG tablet Take 1 tablet (75 mg total) by mouth daily. 90 tablet 3   digoxin (LANOXIN) 0.125 MG tablet Take 1 tablet (0.125 mg total) by mouth daily. 90  tablet 3   lisinopril (ZESTRIL) 2.5 MG tablet Take 1 tablet by mouth once daily 90 tablet 0   Multiple Vitamin (MULTIVITAMIN WITH MINERALS) TABS tablet Take 1 tablet by mouth daily. 30 tablet 0   nitroGLYCERIN (NITROSTAT) 0.4 MG SL tablet PLACE ONE TABLET UNDER THE TONGUE EVERY 5 MINUTES AS NEEDED FOR CHEST PAIN 25 tablet 4   saccharomyces boulardii (FLORASTOR) 250 MG capsule Take 1 capsule (250 mg total) by mouth 2 (two) times daily. 30 capsule 0   No current facility-administered medications for this visit.   Facility-Administered Medications Ordered in Other Visits  Medication Dose Route Frequency Provider Last Rate Last Admin   influenza vac split quadrivalent PF (FLUARIX) injection 0.5 mL  0.5 mL Intramuscular Once Malachy Mood, MD        Allergies  Allergen Reactions   Capecitabine Other (See Comments)    Server dehydration and diarrhea       Celebrex [Celecoxib] Other (See Comments)    Caused his back to burn   Contrast Media [Iodinated Contrast Media] Other (See  Comments)    Patient feels weakness    Social History   Socioeconomic History   Marital status: Single    Spouse name: Not on file   Number of children: 0   Years of education: 2   Highest education level: Not on file  Occupational History   Occupation: Disabled  Tobacco Use   Smoking status: Former    Current packs/day: 0.00    Average packs/day: 2.0 packs/day for 40.0 years (80.0 ttl pk-yrs)    Types: Cigarettes    Start date: 12/22/1974    Quit date: 12/22/2014    Years since quitting: 8.0   Smokeless tobacco: Never  Vaping Use   Vaping status: Never Used  Substance and Sexual Activity   Alcohol use: No    Comment: quit in 08/2013, he used to drink alcohol moderate (1 pine liquor one week)  for 30 years    Drug use: No   Sexual activity: Not Currently    Birth control/protection: Condom    Comment: men   Other Topics Concern   Not on file  Social History Narrative   Lives alone.    In a house  trying to sell his home. Exercise: No.   Social Determinants of Health   Financial Resource Strain: Not on file  Food Insecurity: Not on file  Transportation Needs: Not on file  Physical Activity: Not on file  Stress: Not on file  Social Connections: Not on file  Intimate Partner Violence: Not on file    Family History  Problem Relation Age of Onset   Emphysema Father    Lung disease Father    Heart disease Mother    Dementia Mother    Diabetes Mother    CVA Mother    Alzheimer's disease Mother    Pancreatic cancer Maternal Grandmother    Lung cancer Maternal Grandmother    Cancer Neg Hx     Review of Systems:  As stated in the HPI and otherwise negative.   There were no vitals taken for this visit.  Physical Examination:   General: Well developed, well nourished, NAD  HEENT: OP clear, mucus membranes moist  SKIN: warm, dry. No rashes. Neuro: No focal deficits  Musculoskeletal: Muscle strength 5/5 all ext  Psychiatric: Mood and affect normal  Neck: No JVD, no carotid bruits, no thyromegaly, no lymphadenopathy.  Lungs:Clear bilaterally, no wheezes, rhonci, crackles Cardiovascular: Regular rate and rhythm. No murmurs, gallops or rubs. Abdomen:Soft. Bowel sounds present. Non-tender.  Extremities: No lower extremity edema. Pulses are 2 + in the bilateral DP/PT.  EKG:  EKG is not *** ordered today. The ekg ordered today demonstrates  Recent Labs: No results found for requested labs within last 365 days.   Lipid Panel    Component Value Date/Time   CHOL 127 12/13/2021 0939   TRIG 81 12/13/2021 0939   HDL 35 (L) 12/13/2021 0939   CHOLHDL 3.6 12/13/2021 0939   CHOLHDL 3.9 12/23/2014 0331   VLDL 13 12/23/2014 0331   LDLCALC 76 12/13/2021 0939     Wt Readings from Last 3 Encounters:  12/25/22 80.4 kg  12/13/21 78.9 kg  12/04/21 79.1 kg    Assessment and Plan:   1. CAD with angina: No change in chronic chest pain. Continue Plavix, beta blocker and statin.     2. Ischemic cardiomyopathy: LVEF=30% by echo in 2018. ICD in place and followed by Dr. Ladona Ridgel. He has refused to consider Entresto due to cost. No volume overload on exam.  WE discussed an echo at the last visit but he did not wish to do this. Continue Coreg, Lisinopril and Digoxin.   3. HTN: BP is well controlled. No changes  4. Hyperlipidemia: LDL near goal in November 1610. Continue statin. *** Check lipids and LFTS today.     5. Former tobacco abuse: He has stopped smoking  Follow up with me in 12 months   Labs/ tests ordered today include:  No orders of the defined types were placed in this encounter.  Signed, Verne Carrow, MD 12/31/2022 6:04 PM    [facility] [address] [place], [place], [place]  [address] Phone: [phone]; Fax: [phone]

## 2023-01-01 ENCOUNTER — Ambulatory Visit: Payer: Medicare Other | Attending: Cardiovascular Disease | Admitting: Cardiovascular Disease

## 2023-01-01 ENCOUNTER — Other Ambulatory Visit: Payer: Self-pay | Admitting: *Deleted

## 2023-01-01 ENCOUNTER — Encounter: Payer: Self-pay | Admitting: Cardiovascular Disease

## 2023-01-01 VITALS — BP 122/80 | HR 83 | Ht 67.0 in | Wt 175.8 lb

## 2023-01-01 DIAGNOSIS — I255 Ischemic cardiomyopathy: Secondary | ICD-10-CM | POA: Diagnosis not present

## 2023-01-01 DIAGNOSIS — E785 Hyperlipidemia, unspecified: Secondary | ICD-10-CM | POA: Insufficient documentation

## 2023-01-01 DIAGNOSIS — I10 Essential (primary) hypertension: Secondary | ICD-10-CM | POA: Insufficient documentation

## 2023-01-01 DIAGNOSIS — I2510 Atherosclerotic heart disease of native coronary artery without angina pectoris: Secondary | ICD-10-CM | POA: Diagnosis not present

## 2023-01-01 NOTE — Patient Instructions (Signed)
Medication Instructions:  No changes *If you need a refill on your cardiac medications before your next appointment, please call your pharmacy*   Lab Work: Today: lipids, liver ([facility] on first floor)  If you have labs (blood work) drawn today and your tests are completely normal, you will receive your results only by: MyChart Message (if you have MyChart) OR A paper copy in the mail If you have any lab test that is abnormal or we need to change your treatment, we will call you to review the results.   Testing/Procedures: none   Follow-Up: At [facility], you and your health needs are our priority.  As part of our continuing mission to provide you with exceptional heart care, we have created designated Provider Care Teams.  These Care Teams include your primary Cardiologist (physician) and Advanced Practice Providers (APPs -  Physician Assistants and Nurse Practitioners) who all work together to provide you with the care you need, when you need it.   Your next appointment:   1 year(s)  Provider:   Verne Carrow, MD

## 2023-01-02 LAB — HEPATIC FUNCTION PANEL
ALT: 24 [IU]/L (ref 0–44)
AST: 24 [IU]/L (ref 0–40)
Albumin: 4.2 g/dL (ref 3.8–4.9)
Alkaline Phosphatase: 68 [IU]/L (ref 44–121)
Bilirubin Total: 0.4 mg/dL (ref 0.0–1.2)
Bilirubin, Direct: 0.14 mg/dL (ref 0.00–0.40)
Total Protein: 6.9 g/dL (ref 6.0–8.5)

## 2023-01-02 LAB — LIPID PANEL
Chol/HDL Ratio: 3.3 {ratio} (ref 0.0–5.0)
Cholesterol, Total: 144 mg/dL (ref 100–199)
HDL: 43 mg/dL (ref >39)
LDL Chol Calc (NIH): 85 mg/dL (ref 0–99)
Triglycerides: 81 mg/dL (ref 0–149)
VLDL Cholesterol Cal: 16 mg/dL (ref 5–40)

## 2023-01-17 ENCOUNTER — Other Ambulatory Visit: Payer: Self-pay | Admitting: Internal Medicine

## 2023-02-14 ENCOUNTER — Other Ambulatory Visit: Payer: Self-pay | Admitting: Cardiovascular Disease

## 2023-03-21 ENCOUNTER — Other Ambulatory Visit: Payer: Self-pay | Admitting: Cardiovascular Disease

## 2023-03-26 ENCOUNTER — Ambulatory Visit (INDEPENDENT_AMBULATORY_CARE_PROVIDER_SITE_OTHER): Payer: Medicare Other

## 2023-03-26 DIAGNOSIS — I255 Ischemic cardiomyopathy: Secondary | ICD-10-CM | POA: Diagnosis not present

## 2023-03-27 ENCOUNTER — Encounter: Payer: Self-pay | Admitting: Internal Medicine

## 2023-03-27 LAB — CUP PACEART REMOTE DEVICE CHECK
Battery Remaining Longevity: 30 mo
Battery Voltage: 2.95 V
Brady Statistic RV Percent Paced: 0.01 %
Date Time Interrogation Session: 20250225033427
HighPow Impedance: 76 Ohm
Implantable Lead Connection Status: 753985
Implantable Lead Implant Date: 20150810
Implantable Lead Location: 753860
Implantable Lead Model: 6935
Implantable Pulse Generator Implant Date: 20150810
Lead Channel Impedance Value: 361 Ohm
Lead Channel Impedance Value: 456 Ohm
Lead Channel Pacing Threshold Amplitude: 0.5 V
Lead Channel Pacing Threshold Pulse Width: 0.4 ms
Lead Channel Sensing Intrinsic Amplitude: 8.875 mV
Lead Channel Sensing Intrinsic Amplitude: 8.875 mV
Lead Channel Setting Pacing Amplitude: 2 V
Lead Channel Setting Pacing Pulse Width: 0.4 ms
Lead Channel Setting Sensing Sensitivity: 0.3 mV
Zone Setting Status: 755011

## 2023-03-31 ENCOUNTER — Encounter: Payer: Self-pay | Admitting: Internal Medicine

## 2023-05-01 NOTE — Progress Notes (Signed)
 Remote ICD transmission.

## 2023-05-01 NOTE — Addendum Note (Signed)
 Addended by: Elease Etienne A on: 05/01/2023 01:42 PM   Modules accepted: Orders

## 2023-06-25 ENCOUNTER — Ambulatory Visit (INDEPENDENT_AMBULATORY_CARE_PROVIDER_SITE_OTHER): Payer: Medicare Other

## 2023-06-25 DIAGNOSIS — I255 Ischemic cardiomyopathy: Secondary | ICD-10-CM

## 2023-06-26 LAB — CUP PACEART REMOTE DEVICE CHECK
Battery Remaining Longevity: 28 mo
Battery Voltage: 2.95 V
Brady Statistic RV Percent Paced: 0.01 %
Date Time Interrogation Session: 20250527022822
HighPow Impedance: 72 Ohm
Implantable Lead Connection Status: 753985
Implantable Lead Implant Date: 20150810
Implantable Lead Location: 753860
Implantable Lead Model: 6935
Implantable Pulse Generator Implant Date: 20150810
Lead Channel Impedance Value: 342 Ohm
Lead Channel Impedance Value: 456 Ohm
Lead Channel Pacing Threshold Amplitude: 0.75 V
Lead Channel Pacing Threshold Pulse Width: 0.4 ms
Lead Channel Sensing Intrinsic Amplitude: 6.25 mV
Lead Channel Sensing Intrinsic Amplitude: 6.25 mV
Lead Channel Setting Pacing Amplitude: 2 V
Lead Channel Setting Pacing Pulse Width: 0.4 ms
Lead Channel Setting Sensing Sensitivity: 0.3 mV
Zone Setting Status: 755011

## 2023-06-27 ENCOUNTER — Ambulatory Visit: Payer: Self-pay | Admitting: Internal Medicine

## 2023-07-05 ENCOUNTER — Encounter: Payer: Self-pay | Admitting: Cardiovascular Disease

## 2023-08-08 ENCOUNTER — Encounter: Payer: Self-pay | Admitting: Cardiovascular Disease

## 2023-08-09 NOTE — Telephone Encounter (Signed)
 Patient is following up requesting updates. I informed him Dr. Verlin is scheduled to return to the office on 7/14.

## 2023-08-19 ENCOUNTER — Telehealth: Payer: Self-pay | Admitting: Internal Medicine

## 2023-08-19 NOTE — Telephone Encounter (Signed)
  1. Has your device fired? No   2. Is you device beeping? no  3. Are you experiencing draining or swelling at device site? no  4. Are you calling to see if we received your device transmission? yes  5. Have you passed out? no    Please route to Device Clinic Pool  

## 2023-08-19 NOTE — Telephone Encounter (Signed)
 Patient said he did not send a transmission.  He was just checking it to make sure it was communicating properly and said he already received confirmation.   He has not transmitted since May and he wanted to be sure it didn't send anything he would be charged for.  I reassured him that his last charged send was in May and wouldn't occur again until August.

## 2023-08-19 NOTE — Progress Notes (Signed)
 Remote ICD transmission.

## 2023-08-19 NOTE — Addendum Note (Signed)
 Addended by: TAWNI DRILLING D on: 08/19/2023 12:16 PM   Modules accepted: Orders

## 2023-09-24 ENCOUNTER — Ambulatory Visit (INDEPENDENT_AMBULATORY_CARE_PROVIDER_SITE_OTHER): Payer: Medicare Other

## 2023-09-24 DIAGNOSIS — I255 Ischemic cardiomyopathy: Secondary | ICD-10-CM

## 2023-09-25 LAB — CUP PACEART REMOTE DEVICE CHECK
Battery Remaining Longevity: 25 mo
Battery Voltage: 2.93 V
Brady Statistic RV Percent Paced: 0.01 %
Date Time Interrogation Session: 20250826093527
HighPow Impedance: 70 Ohm
Implantable Lead Connection Status: 753985
Implantable Lead Implant Date: 20150810
Implantable Lead Location: 753860
Implantable Lead Model: 6935
Implantable Pulse Generator Implant Date: 20150810
Lead Channel Impedance Value: 361 Ohm
Lead Channel Impedance Value: 456 Ohm
Lead Channel Pacing Threshold Amplitude: 0.5 V
Lead Channel Pacing Threshold Pulse Width: 0.4 ms
Lead Channel Sensing Intrinsic Amplitude: 5.625 mV
Lead Channel Sensing Intrinsic Amplitude: 5.625 mV
Lead Channel Setting Pacing Amplitude: 2 V
Lead Channel Setting Pacing Pulse Width: 0.4 ms
Lead Channel Setting Sensing Sensitivity: 0.3 mV
Zone Setting Status: 755011

## 2023-09-26 ENCOUNTER — Ambulatory Visit: Payer: Self-pay | Admitting: Internal Medicine

## 2023-10-14 NOTE — Progress Notes (Signed)
Remote ICD Transmission.

## 2023-10-28 DIAGNOSIS — Z23 Encounter for immunization: Secondary | ICD-10-CM | POA: Diagnosis not present

## 2023-11-05 ENCOUNTER — Other Ambulatory Visit: Payer: Self-pay | Admitting: Cardiovascular Disease

## 2023-11-06 ENCOUNTER — Other Ambulatory Visit: Payer: Self-pay | Admitting: Cardiovascular Disease

## 2023-11-06 ENCOUNTER — Encounter: Payer: Self-pay | Admitting: Cardiovascular Disease

## 2023-11-25 ENCOUNTER — Encounter: Payer: Self-pay | Admitting: Cardiovascular Disease

## 2023-12-20 ENCOUNTER — Other Ambulatory Visit: Payer: Self-pay | Admitting: Cardiovascular Disease

## 2023-12-23 ENCOUNTER — Ambulatory Visit: Attending: Cardiovascular Disease | Admitting: Internal Medicine

## 2023-12-23 ENCOUNTER — Encounter: Payer: Self-pay | Admitting: Internal Medicine

## 2023-12-23 VITALS — BP 108/73 | HR 73 | Ht 67.0 in | Wt 170.0 lb

## 2023-12-23 DIAGNOSIS — I5022 Chronic systolic (congestive) heart failure: Secondary | ICD-10-CM | POA: Insufficient documentation

## 2023-12-23 LAB — CUP PACEART INCLINIC DEVICE CHECK
Date Time Interrogation Session: 20251124150806
Implantable Lead Connection Status: 753985
Implantable Lead Implant Date: 20150810
Implantable Lead Location: 753860
Implantable Lead Model: 6935
Implantable Pulse Generator Implant Date: 20150810

## 2023-12-23 NOTE — Patient Instructions (Signed)

## 2023-12-23 NOTE — Progress Notes (Signed)
 HPI Christopher [name] returns today for followup. He has a longstanding ICM, chronic class 2 CHF, dyslipidemia, s/p ICD insertion. In the interim he has had some GI complaints and is pending Colonoscopy. He has not had chest pain, sob or edema. No ICD therapy.  Allergies  Allergen Reactions   Capecitabine  Other (See Comments)    Server dehydration and diarrhea       Celebrex [Celecoxib] Other (See Comments)    Caused his back to burn   Contrast Media [Iodinated Contrast Media] Other (See Comments)    Patient feels weakness     Current Outpatient Medications  Medication Sig Dispense Refill   atorvastatin  (LIPITOR ) 80 MG tablet TAKE 1 TABLET BY MOUTH ONCE DAILY AT  6PM 90 tablet 3   carvedilol  (COREG ) 3.125 MG tablet TAKE 1 TABLET BY MOUTH TWICE DAILY WITH A MEAL 180 tablet 2   clopidogrel  (PLAVIX ) 75 MG tablet Take 1 tablet by mouth once daily 90 tablet 2   digoxin  (LANOXIN ) 0.125 MG tablet Take 1 tablet by mouth once daily 90 tablet 3   lisinopril  (ZESTRIL ) 2.5 MG tablet Take 1 tablet by mouth once daily 90 tablet 3   Multiple Vitamin (MULTIVITAMIN WITH MINERALS) TABS tablet Take 1 tablet by mouth daily. 30 tablet 0   nitroGLYCERIN  (NITROSTAT ) 0.4 MG SL tablet DISSOLVE ONE TABLET UNDER THE TONGUE EVERY 5 MINUTES AS NEEDED FOR CHEST PAIN. 25 tablet 0   No current facility-administered medications for this visit.   Facility-Administered Medications Ordered in Other Visits  Medication Dose Route Frequency Provider Last Rate Last Admin   influenza vac split quadrivalent PF (FLUARIX) injection 0.5 mL  0.5 mL Intramuscular Once Lanny Callander, MD         Past Medical History:  Diagnosis Date   Adenocarcinoma of descending colon  pT3, pN1a, pMX s/p colectomy/ostomy 05/18/2016 05/26/2016   AICD (automatic cardioverter/defibrillator) present    09/07/13 Dr. Waddell post MI with EF 30% RV lead      Medtronic 6947 (serial number [id number]) Generator   Medtronic  Evera XT VR (serial Number  [id number])    Anemia    Anemia due to blood loss 05/03/2016   Anxiety    Anxiety state 01/17/2015   Automatic implantable cardioverter-defibrillator in situ    MDT Aug 2015 Dr. Waddell   Benign neoplasm of cecum    CAD (coronary artery disease), native coronary artery 07/03/2013   Cath 06/20/13  Normal left main, occluded LAD, occluded RCA, 50% circ EF 15%  3.0 x28 and 3.0 x 8 mm Xience stent Dr. Claudene  To LAD    Cancer [facility])    Cardiomyopathy, ischemic    Chronic anticoagulation 06/12/2017   Chronic kidney disease    Chronic systolic CHF (congestive heart failure) (HCC)    ECHO 08/05/13  EF 30%  Anterior akinesis and inferior hypokinesis    COPD (chronic obstructive pulmonary disease) (HCC)    Essential hypertension 05/16/2016   Financial difficulties 10/01/2013   History of colon cancer    Hyperlipidemia    Intra-abdominal abscess 06/12/2017   Iron deficiency anemia due to chronic blood loss 07/02/2016   Mass of colon 04/2016   Myocardial infarction [facility]) 04/29/2013   Old anterior myocardial infarction 06/20/2013   per pt 12/22/2014 had an MI   Partial bowel obstruction (HCC) 05/16/2016   S/P colostomy takedown 05/20/2017   Thrombocytopenia 12/27/2014   Chronic     Tobacco use 07/03/2013    ROS:  All systems reviewed and negative except as noted in the HPI.   Past Surgical History:  Procedure Laterality Date   CARDIAC CATHETERIZATION  05/2013    CARDIAC CATHETERIZATION N/A 12/28/2014   Procedure: Left Heart Cath and Coronary Angiography;  Surgeon: Debby DELENA Sor, MD;  Location: [facility] INVASIVE CV LAB;  Service: Cardiovascular;  Laterality: N/A;   CARDIAC CATHETERIZATION N/A 12/28/2014   Procedure: Coronary Stent Intervention;  Surgeon: Debby DELENA Sor, MD;  Location: [facility] INVASIVE CV LAB;  Service: Cardiovascular;  Laterality: N/A;   COLECTOMY WITH COLOSTOMY CREATION/HARTMANN PROCEDURE Left 05/18/2016   Procedure: COLECTOMY WITH OSTOMY CREATION/HARTMANN PROCEDURE;  Surgeon: Dann Hummer, MD;   Location: [facility] OR;  Service: General;  Laterality: Left;   COLONOSCOPY     COLONOSCOPY WITH PROPOFOL  N/A 04/01/2017   Procedure: COLONOSCOPY WITH PROPOFOL ;  Surgeon: Legrand Victory LITTIE DOUGLAS, MD;  Location: [facility] ENDOSCOPY;  Service: Gastroenterology;  Laterality: N/A;   COLOSTOMY TAKEDOWN N/A 05/20/2017   Procedure: COLOSTOMY TAKEDOWN;  Surgeon: Hummer Dann, MD;  Location: [facility] OR;  Service: General;  Laterality: N/A;   CORONARY ANGIOPLASTY  05/2013    FLEXIBLE SIGMOIDOSCOPY N/A 05/16/2016   Procedure: FLEXIBLE SIGMOIDOSCOPY;  Surgeon: Victory LITTIE Legrand DOUGLAS, MD;  Location: [facility] ENDOSCOPY;  Service: Gastroenterology;  Laterality: N/A;   HAND SURGERY Right    cyst removal   ICD placement  09/07/2013    IMPLANTABLE CARDIOVERTER DEFIBRILLATOR IMPLANT N/A 09/07/2013   Procedure: IMPLANTABLE CARDIOVERTER DEFIBRILLATOR IMPLANT;  Surgeon: Danelle LELON Birmingham, MD;  Location: [facility] CATH LAB;  Service: Cardiovascular;  Laterality: N/A;   INTRA-AORTIC BALLOON PUMP INSERTION  06/20/2013   Procedure: INTRA-AORTIC BALLOON PUMP INSERTION;  Surgeon: Victory LELON Claudene DOUGLAS, MD;  Location: [facility] CATH LAB;  Service: Cardiovascular;;   LEFT HEART CATHETERIZATION WITH CORONARY ANGIOGRAM N/A 06/20/2013   Procedure: LEFT HEART CATHETERIZATION WITH CORONARY ANGIOGRAM;  Surgeon: Victory LELON Claudene DOUGLAS, MD;  Location: [facility] CATH LAB;  Service: Cardiovascular;  Laterality: N/A;   PERCUTANEOUS CORONARY STENT INTERVENTION (PCI-S)  06/20/2013   Procedure: PERCUTANEOUS CORONARY STENT INTERVENTION (PCI-S);  Surgeon: Victory LELON Claudene DOUGLAS, MD;  Location: [facility] CATH LAB;  Service: Cardiovascular;;     Family History  Problem Relation Age of Onset   Emphysema Father    Lung disease Father    Heart disease Mother    Dementia Mother    Diabetes Mother    CVA Mother    Alzheimer's disease Mother    Pancreatic cancer Maternal Grandmother    Lung cancer Maternal Grandmother    Cancer Neg Hx      Social History   Socioeconomic History   Marital status: Single    Spouse  name: Not on file   Number of children: 0   Years of education: 12   Highest education level: Not on file  Occupational History   Occupation: Disabled  Tobacco Use   Smoking status: Former    Current packs/day: 0.00    Average packs/day: 2.0 packs/day for 40.0 years (80.0 ttl pk-yrs)    Types: Cigarettes    Start date: 12/22/1974    Quit date: 12/22/2014    Years since quitting: 9.0   Smokeless tobacco: Never  Vaping Use   Vaping status: Never Used  Substance and Sexual Activity   Alcohol use: No    Comment: quit in 08/2013, he used to drink alcohol moderate (1 pine liquor one week)  for 30 years    Drug use: No   Sexual activity: Not Currently  Birth control/protection: Condom    Comment: men   Other Topics Concern   Not on file  Social History Narrative   Lives alone.    In a house trying to sell his home. Exercise: No.   Social Drivers of Corporate Investment Banker Strain: Not on file  Food Insecurity: Not on file  Transportation Needs: Not on file  Physical Activity: Not on file  Stress: Not on file  Social Connections: Not on file  Intimate Partner Violence: Not on file     BP 108/73   Pulse 73   Ht 5' 7 (1.702 m)   Wt 170 lb (77.1 kg)   SpO2 96%   BMI 26.63 kg/m   Physical Exam:  Well appearing NAD HEENT: Unremarkable Neck:  No JVD, no thyromegally Lymphatics:  No adenopathy Back:  No CVA tenderness Lungs:  Clear with no wheezes HEART:  Regular rate rhythm, no murmurs, no rubs, no clicks Abd:  soft, positive bowel sounds, no organomegally, no rebound, no guarding Ext:  2 plus pulses, no edema, no cyanosis, no clubbing Skin:  No rashes no nodules Neuro:  CN II through XII intact, motor grossly intact  EKG - NSR with IVCD  DEVICE  Normal device function.  See PaceArt for details.   Assess/Plan:  ICM - he is doing well with minimal symptoms. No change in meds. He is encouraged to remain active. 2. Chronic systolic heart failure - his  symptoms are class 2. He will continue his current meds. Might consider upgrade to a biv ICD in 2 years at the time he reaches ERI. 3. ICD - his medtronic single chamber ICD is working normally. 4. Dyslipidemia - he will  Continue his high dose statin. No muscle complaints.   Danelle Cyler Kappes,MD

## 2023-12-24 ENCOUNTER — Ambulatory Visit: Payer: Medicare Other

## 2023-12-24 DIAGNOSIS — I5022 Chronic systolic (congestive) heart failure: Secondary | ICD-10-CM

## 2023-12-25 LAB — CUP PACEART REMOTE DEVICE CHECK
Battery Remaining Longevity: 24 mo
Battery Voltage: 2.93 V
Brady Statistic RV Percent Paced: 0.01 %
Date Time Interrogation Session: 20251125052705
HighPow Impedance: 76 Ohm
Implantable Lead Connection Status: 753985
Implantable Lead Implant Date: 20150810
Implantable Lead Location: 753860
Implantable Lead Model: 6935
Implantable Pulse Generator Implant Date: 20150810
Lead Channel Impedance Value: 342 Ohm
Lead Channel Impedance Value: 456 Ohm
Lead Channel Pacing Threshold Amplitude: 0.5 V
Lead Channel Pacing Threshold Pulse Width: 0.4 ms
Lead Channel Sensing Intrinsic Amplitude: 8.125 mV
Lead Channel Sensing Intrinsic Amplitude: 8.125 mV
Lead Channel Setting Pacing Amplitude: 2 V
Lead Channel Setting Pacing Pulse Width: 0.4 ms
Lead Channel Setting Sensing Sensitivity: 0.3 mV
Zone Setting Status: 755011

## 2023-12-27 NOTE — Progress Notes (Signed)
 Remote ICD Transmission

## 2023-12-30 NOTE — Progress Notes (Unsigned)
 No chief complaint on file.   History of Present Illness: 63 yo male with history of anxiety, CAD with prior stenting of the LAD and obtuse marginal, ischemic cardiomyopathy s/p ICD, chronic kidney disease, COPD, chronic systolic CHF, HTN, hyperlipidemia, former tobacco abuse, iron deficiency anemia, colon cancer s/p colectomy here today for follow up. He has been followed in the past by Dr. Blanca. Given Dr. Gabe absence due to illness, Mr. Lonzo was seen in our [facility] Cardiology office by Dr. Monetta 02/10/18 but became angry at that office when he found out the cost of Entresto . I met him in February 2020. He has a complex past medical history. He has undergone a colectomy for colon cancer. He was admitted to [facility] May 2015 with a late presenting anterior STEMI. He was found to have chronic total occlusion of the mid RCA and a totally occluded proximal to mid LAD. There was moderate disease in the Circumflex artery. LVEF was 15-20%. Overlapping drug eluting stents were placed in the mid LAD. ICD implanted 09/07/13. His ICD has been followed in our EP clinic by Dr. Waddell. Cardiac cath November 2016 with widely patent stent in the LAD. Chronically occluded RCA with filling from left to right collaterals. There was a severe stenosis in the obtuse marginal branch treated with a drug eluting stent. Echo April 2018 with LVEF=25-30%. No significant valve disease.   He is here today for follow up. The patient denies any chest pain, dyspnea, palpitations, lower extremity edema, orthopnea, PND, dizziness, near syncope or syncope.    Primary Care Physician: Patient, No Pcp Per  Past Medical History:  Diagnosis Date   Adenocarcinoma of descending colon  pT3, pN1a, pMX s/p colectomy/ostomy 05/18/2016 05/26/2016   AICD (automatic cardioverter/defibrillator) present    09/07/13 Dr. Waddell post MI with EF 30% RV lead      Medtronic 6947 (serial number [id number]) Generator   Medtronic  Evera XT VR (serial Number  [id number])    Anemia    Anemia due to blood loss 05/03/2016   Anxiety    Anxiety state 01/17/2015   Automatic implantable cardioverter-defibrillator in situ    MDT Aug 2015 Dr. Waddell   Benign neoplasm of cecum    CAD (coronary artery disease), native coronary artery 07/03/2013   Cath 06/20/13  Normal left main, occluded LAD, occluded RCA, 50% circ EF 15%  3.0 x28 and 3.0 x 8 mm Xience stent Dr. Claudene  To LAD    Cancer [facility])    Cardiomyopathy, ischemic    Chronic anticoagulation 06/12/2017   Chronic kidney disease    Chronic systolic CHF (congestive heart failure) (HCC)    ECHO 08/05/13  EF 30%  Anterior akinesis and inferior hypokinesis    COPD (chronic obstructive pulmonary disease) (HCC)    Essential hypertension 05/16/2016   Financial difficulties 10/01/2013   History of colon cancer    Hyperlipidemia    Intra-abdominal abscess 06/12/2017   Iron deficiency anemia due to chronic blood loss 07/02/2016   Mass of colon 04/2016   Myocardial infarction (HCC) 04/29/2013   Old anterior myocardial infarction 06/20/2013   per pt 12/22/2014 had an MI   Partial bowel obstruction (HCC) 05/16/2016   S/P colostomy takedown 05/20/2017   Thrombocytopenia 12/27/2014   Chronic     Tobacco use 07/03/2013    Past Surgical History:  Procedure Laterality Date   CARDIAC CATHETERIZATION  05/2013    CARDIAC CATHETERIZATION N/A 12/28/2014   Procedure: Left Heart Cath and Coronary Angiography;  Surgeon: Debby DELENA Sor, MD;  Location: [facility] INVASIVE CV LAB;  Service: Cardiovascular;  Laterality: N/A;   CARDIAC CATHETERIZATION N/A 12/28/2014   Procedure: Coronary Stent Intervention;  Surgeon: Debby DELENA Sor, MD;  Location: [facility] INVASIVE CV LAB;  Service: Cardiovascular;  Laterality: N/A;   COLECTOMY WITH COLOSTOMY CREATION/HARTMANN PROCEDURE Left 05/18/2016   Procedure: COLECTOMY WITH OSTOMY CREATION/HARTMANN PROCEDURE;  Surgeon: Dann Hummer, MD;  Location: [facility] OR;  Service: General;  Laterality: Left;   COLONOSCOPY      COLONOSCOPY WITH PROPOFOL  N/A 04/01/2017   Procedure: COLONOSCOPY WITH PROPOFOL ;  Surgeon: Legrand Victory LITTIE DOUGLAS, MD;  Location: [facility] ENDOSCOPY;  Service: Gastroenterology;  Laterality: N/A;   COLOSTOMY TAKEDOWN N/A 05/20/2017   Procedure: COLOSTOMY TAKEDOWN;  Surgeon: Hummer Dann, MD;  Location: [facility] OR;  Service: General;  Laterality: N/A;   CORONARY ANGIOPLASTY  05/2013    FLEXIBLE SIGMOIDOSCOPY N/A 05/16/2016   Procedure: FLEXIBLE SIGMOIDOSCOPY;  Surgeon: Victory LITTIE Legrand DOUGLAS, MD;  Location: [facility] ENDOSCOPY;  Service: Gastroenterology;  Laterality: N/A;   HAND SURGERY Right    cyst removal   ICD placement  09/07/2013    IMPLANTABLE CARDIOVERTER DEFIBRILLATOR IMPLANT N/A 09/07/2013   Procedure: IMPLANTABLE CARDIOVERTER DEFIBRILLATOR IMPLANT;  Surgeon: Danelle LELON Birmingham, MD;  Location: [facility] CATH LAB;  Service: Cardiovascular;  Laterality: N/A;   INTRA-AORTIC BALLOON PUMP INSERTION  06/20/2013   Procedure: INTRA-AORTIC BALLOON PUMP INSERTION;  Surgeon: Victory LELON Claudene DOUGLAS, MD;  Location: [facility] CATH LAB;  Service: Cardiovascular;;   LEFT HEART CATHETERIZATION WITH CORONARY ANGIOGRAM N/A 06/20/2013   Procedure: LEFT HEART CATHETERIZATION WITH CORONARY ANGIOGRAM;  Surgeon: Victory LELON Claudene DOUGLAS, MD;  Location: [facility] CATH LAB;  Service: Cardiovascular;  Laterality: N/A;   PERCUTANEOUS CORONARY STENT INTERVENTION (PCI-S)  06/20/2013   Procedure: PERCUTANEOUS CORONARY STENT INTERVENTION (PCI-S);  Surgeon: Victory LELON Claudene DOUGLAS, MD;  Location: [facility] CATH LAB;  Service: Cardiovascular;;    Current Outpatient Medications  Medication Sig Dispense Refill   atorvastatin  (LIPITOR ) 80 MG tablet TAKE 1 TABLET BY MOUTH ONCE DAILY AT  6PM 90 tablet 3   carvedilol  (COREG ) 3.125 MG tablet TAKE 1 TABLET BY MOUTH TWICE DAILY WITH A MEAL 180 tablet 0   clopidogrel  (PLAVIX ) 75 MG tablet Take 1 tablet by mouth once daily 90 tablet 0   digoxin  (LANOXIN ) 0.125 MG tablet Take 1 tablet by mouth once daily 90 tablet 3   lisinopril  (ZESTRIL ) 2.5 MG tablet Take  1 tablet by mouth once daily 90 tablet 3   Multiple Vitamin (MULTIVITAMIN WITH MINERALS) TABS tablet Take 1 tablet by mouth daily. 30 tablet 0   nitroGLYCERIN  (NITROSTAT ) 0.4 MG SL tablet DISSOLVE ONE TABLET UNDER THE TONGUE EVERY 5 MINUTES AS NEEDED FOR CHEST PAIN. 25 tablet 0   No current facility-administered medications for this visit.   Facility-Administered Medications Ordered in Other Visits  Medication Dose Route Frequency Provider Last Rate Last Admin   influenza vac split quadrivalent PF (FLUARIX) injection 0.5 mL  0.5 mL Intramuscular Once Lanny Callander, MD        Allergies  Allergen Reactions   Capecitabine  Other (See Comments)    Server dehydration and diarrhea       Celebrex [Celecoxib] Other (See Comments)    Caused his back to burn   Contrast Media [Iodinated Contrast Media] Other (See Comments)    Patient feels weakness    Social History   Socioeconomic History   Marital status: Single    Spouse name: Not on file   Number  of children: 0   Years of education: 12   Highest education level: Not on file  Occupational History   Occupation: Disabled  Tobacco Use   Smoking status: Former    Current packs/day: 0.00    Average packs/day: 2.0 packs/day for 40.0 years (80.0 ttl pk-yrs)    Types: Cigarettes    Start date: 12/22/1974    Quit date: 12/22/2014    Years since quitting: 9.0   Smokeless tobacco: Never  Vaping Use   Vaping status: Never Used  Substance and Sexual Activity   Alcohol use: No    Comment: quit in 08/2013, he used to drink alcohol moderate (1 pine liquor one week)  for 30 years    Drug use: No   Sexual activity: Not Currently    Birth control/protection: Condom    Comment: men   Other Topics Concern   Not on file  Social History Narrative   Lives alone.    In a house trying to sell his home. Exercise: No.   Social Drivers of Corporate Investment Banker Strain: Not on file  Food Insecurity: Not on file  Transportation Needs: Not on  file  Physical Activity: Not on file  Stress: Not on file  Social Connections: Not on file  Intimate Partner Violence: Not on file    Family History  Problem Relation Age of Onset   Emphysema Father    Lung disease Father    Heart disease Mother    Dementia Mother    Diabetes Mother    CVA Mother    Alzheimer's disease Mother    Pancreatic cancer Maternal Grandmother    Lung cancer Maternal Grandmother    Cancer Neg Hx     Review of Systems:  As stated in the HPI and otherwise negative.   There were no vitals taken for this visit.  Physical Examination:  General: Well developed, well nourished, NAD  HEENT: OP clear, mucus membranes moist  SKIN: warm, dry. No rashes. Neuro: No focal deficits  Musculoskeletal: Muscle strength 5/5 all ext  Psychiatric: Mood and affect normal  Neck: No JVD, no carotid bruits, no thyromegaly, no lymphadenopathy.  Lungs:Clear bilaterally, no wheezes, rhonci, crackles Cardiovascular: Regular rate and rhythm. No murmurs, gallops or rubs. Abdomen:Soft. Bowel sounds present. Non-tender.  Extremities: No lower extremity edema. Pulses are 2 + in the bilateral DP/PT.  EKG:  EKG is *** ordered today. The ekg ordered today demonstrates  Recent Labs: 01/01/2023: ALT 24   Lipid Panel    Component Value Date/Time   CHOL 144 01/01/2023 1545   TRIG 81 01/01/2023 1545   HDL 43 01/01/2023 1545   CHOLHDL 3.3 01/01/2023 1545   CHOLHDL 3.9 12/23/2014 0331   VLDL 13 12/23/2014 0331   LDLCALC 85 01/01/2023 1545     Wt Readings from Last 3 Encounters:  12/23/23 170 lb (77.1 kg)  01/01/23 175 lb 12.8 oz (79.7 kg)  12/25/22 177 lb 3.2 oz (80.4 kg)    Assessment and Plan:   1. CAD with angina: No change in his chronic angina. Continue Plavix , statin and beta blocker.     2. Ischemic cardiomyopathy: LVEF=30% by echo in 2018. ICD in place and followed by Dr. Waddell. He has refused to consider Entresto  due to cost. He has not wished to repeat an echo.  *** I asked today. No volume overload on exam. Wt is stable.  -Continue Coreg , Lisinopril  and Digoxin .    3. HTN: BP is well controlled. Continue  current therapy  4. Hyperlipidemia: LDL near goal in December 7975. Continue statin. *** Check lipids and LFTs today.      5. Former tobacco abuse: He has stopped smoking  Follow up with me in 12 months   Labs/ tests ordered today include: No orders of the defined types were placed in this encounter.  Signed, Lonni Cash, MD 12/30/2023 12:51 PM    [facility] [address] [place], [place], [place]  [address] Phone: [phone]; Fax: [phone]

## 2023-12-31 ENCOUNTER — Ambulatory Visit: Attending: Cardiovascular Disease | Admitting: Cardiovascular Disease

## 2023-12-31 ENCOUNTER — Encounter: Payer: Self-pay | Admitting: Cardiovascular Disease

## 2023-12-31 VITALS — BP 110/80 | HR 80 | Ht 67.0 in | Wt 175.8 lb

## 2023-12-31 DIAGNOSIS — I255 Ischemic cardiomyopathy: Secondary | ICD-10-CM | POA: Diagnosis not present

## 2023-12-31 DIAGNOSIS — I10 Essential (primary) hypertension: Secondary | ICD-10-CM | POA: Diagnosis not present

## 2023-12-31 DIAGNOSIS — E785 Hyperlipidemia, unspecified: Secondary | ICD-10-CM | POA: Diagnosis not present

## 2023-12-31 DIAGNOSIS — I2510 Atherosclerotic heart disease of native coronary artery without angina pectoris: Secondary | ICD-10-CM | POA: Insufficient documentation

## 2023-12-31 NOTE — Patient Instructions (Signed)
 Medication Instructions:  Your physician recommends that you continue on your current medications as directed. Please refer to the Current Medication list given to you today.  *If you need a refill on your cardiac medications before your next appointment, please call your pharmacy*  Lab Work: Have lab work done in the lab on the first floor today--Lipid and liver profiles If you have labs (blood work) drawn today and your tests are completely normal, you will receive your results only by: MyChart Message (if you have MyChart) OR A paper copy in the mail If you have any lab test that is abnormal or we need to change your treatment, we will call you to review the results.  Testing/Procedures: none  Follow-Up: At [facility], you and your health needs are our priority.  As part of our continuing mission to provide you with exceptional heart care, our providers are all part of one team.  This team includes your primary Cardiologist (physician) and Advanced Practice Providers or APPs (Physician Assistants and Nurse Practitioners) who all work together to provide you with the care you need, when you need it.  Your next appointment:   12 month(s)  Provider:   Lonni Cash, MD    We recommend signing up for the patient portal called MyChart.  Sign up information is provided on this After Visit Summary.  MyChart is used to connect with patients for Virtual Visits (Telemedicine).  Patients are able to view lab/test results, encounter notes, upcoming appointments, etc.  Non-urgent messages can be sent to your provider as well.   To learn more about what you can do with MyChart, go to [email or web address].   Other Instructions

## 2024-01-01 ENCOUNTER — Ambulatory Visit: Payer: Self-pay | Admitting: Internal Medicine

## 2024-01-01 ENCOUNTER — Ambulatory Visit: Payer: Self-pay | Admitting: Cardiovascular Disease

## 2024-01-01 LAB — HEPATIC FUNCTION PANEL
ALT: 22 IU/L (ref 0–44)
AST: 21 IU/L (ref 0–40)
Albumin: 4.3 g/dL (ref 3.9–4.9)
Alkaline Phosphatase: 65 IU/L (ref 47–123)
Bilirubin Total: 0.4 mg/dL (ref 0.0–1.2)
Bilirubin, Direct: 0.12 mg/dL (ref 0.00–0.40)
Total Protein: 6.8 g/dL (ref 6.0–8.5)

## 2024-01-01 LAB — LIPID PANEL
Chol/HDL Ratio: 2.8 ratio (ref 0.0–5.0)
Cholesterol, Total: 130 mg/dL (ref 100–199)
HDL: 46 mg/dL (ref >39)
LDL Chol Calc (NIH): 69 mg/dL (ref 0–99)
Triglycerides: 75 mg/dL (ref 0–149)
VLDL Cholesterol Cal: 15 mg/dL (ref 5–40)

## 2024-01-16 ENCOUNTER — Other Ambulatory Visit: Payer: Self-pay | Admitting: Cardiovascular Disease

## 2024-01-31 ENCOUNTER — Encounter: Payer: Self-pay | Admitting: Cardiovascular Disease

## 2024-01-31 MED ORDER — CLOPIDOGREL BISULFATE 75 MG PO TABS: 75.0000 mg | ORAL_TABLET | Freq: Every day | ORAL | 3 refills | Status: AC

## 2024-01-31 MED ORDER — ATORVASTATIN CALCIUM 80 MG PO TABS: ORAL_TABLET | ORAL | 3 refills | Status: DC

## 2024-01-31 MED ORDER — DIGOXIN 125 MCG PO TABS: 125.0000 ug | ORAL_TABLET | Freq: Every day | ORAL | 3 refills | Status: DC

## 2024-01-31 MED ORDER — NITROGLYCERIN 0.4 MG SL SUBL: 0.4000 mg | SUBLINGUAL_TABLET | SUBLINGUAL | 3 refills | Status: AC | PRN

## 2024-01-31 MED ORDER — CARVEDILOL 3.125 MG PO TABS: 3.1250 mg | ORAL_TABLET | Freq: Two times a day (BID) | ORAL | 3 refills | Status: AC

## 2024-02-11 ENCOUNTER — Other Ambulatory Visit: Payer: Self-pay | Admitting: Cardiovascular Disease

## 2024-03-24 ENCOUNTER — Ambulatory Visit: Payer: Medicare Other

## 2024-06-23 ENCOUNTER — Ambulatory Visit: Payer: Medicare Other
